# Patient Record
Sex: Female | Born: 1955 | Race: White | Hispanic: No | Smoking: Never smoker
Health system: Southern US, Community
[De-identification: ages and names within clinical notes are randomized; demographics above are authoritative.]

## PROBLEM LIST (undated history)

## (undated) ENCOUNTER — Emergency Department (HOSPITAL_COMMUNITY): Disposition: A | Discharge status: Home / Self Care | Payer: Medicaid Other

## (undated) DIAGNOSIS — N289 Disorder of kidney and ureter, unspecified: Secondary | ICD-10-CM

## (undated) DIAGNOSIS — G4733 Obstructive sleep apnea (adult) (pediatric): Secondary | ICD-10-CM

## (undated) DIAGNOSIS — I509 Heart failure, unspecified: Secondary | ICD-10-CM

## (undated) DIAGNOSIS — E11610 Type 2 diabetes mellitus with diabetic neuropathic arthropathy: Secondary | ICD-10-CM

## (undated) DIAGNOSIS — E782 Mixed hyperlipidemia: Secondary | ICD-10-CM

## (undated) DIAGNOSIS — I10 Essential (primary) hypertension: Secondary | ICD-10-CM

## (undated) DIAGNOSIS — G629 Polyneuropathy, unspecified: Secondary | ICD-10-CM

## (undated) DIAGNOSIS — N200 Calculus of kidney: Secondary | ICD-10-CM

## (undated) DIAGNOSIS — N183 Chronic kidney disease, stage 3 unspecified: Secondary | ICD-10-CM

## (undated) DIAGNOSIS — E119 Type 2 diabetes mellitus without complications: Secondary | ICD-10-CM

## (undated) DIAGNOSIS — M14671 Charcot's joint, right ankle and foot: Secondary | ICD-10-CM

## (undated) HISTORY — DX: Type 2 diabetes mellitus without complications: E11.9

## (undated) HISTORY — PX: KNEE SURGERY: SHX244

## (undated) HISTORY — PX: REPLACEMENT TOTAL KNEE: SUR1224

## (undated) HISTORY — DX: Mixed hyperlipidemia: E78.2

## (undated) HISTORY — PX: TONSILLECTOMY: SUR1361

## (undated) HISTORY — DX: Chronic kidney disease, stage 3 unspecified: N18.30

## (undated) HISTORY — DX: Essential (primary) hypertension: I10

## (undated) HISTORY — DX: Obstructive sleep apnea (adult) (pediatric): G47.33

## (undated) HISTORY — DX: Calculus of kidney: N20.0

## (undated) HISTORY — DX: Type 2 diabetes mellitus with diabetic neuropathic arthropathy: E11.610

## (undated) HISTORY — PX: ANKLE SURGERY: SHX546

---

## 2018-07-18 ENCOUNTER — Other Ambulatory Visit (HOSPITAL_COMMUNITY): Payer: Self-pay | Admitting: Internal Medicine

## 2018-07-18 ENCOUNTER — Telehealth (HOSPITAL_COMMUNITY): Payer: Self-pay | Admitting: Physical Therapy

## 2018-07-18 ENCOUNTER — Ambulatory Visit (HOSPITAL_COMMUNITY): Payer: Self-pay | Attending: Internal Medicine | Admitting: Physical Therapy

## 2018-07-18 ENCOUNTER — Ambulatory Visit (HOSPITAL_COMMUNITY)
Admission: RE | Admit: 2018-07-18 | Discharge: 2018-07-18 | Disposition: A | Discharge status: Home / Self Care | Payer: Self-pay | Source: Ambulatory Visit | Attending: Internal Medicine | Admitting: Internal Medicine

## 2018-07-18 ENCOUNTER — Other Ambulatory Visit: Payer: Self-pay

## 2018-07-18 DIAGNOSIS — M79671 Pain in right foot: Secondary | ICD-10-CM | POA: Insufficient documentation

## 2018-07-18 DIAGNOSIS — R600 Localized edema: Secondary | ICD-10-CM | POA: Insufficient documentation

## 2018-07-18 DIAGNOSIS — S91301S Unspecified open wound, right foot, sequela: Secondary | ICD-10-CM | POA: Insufficient documentation

## 2018-07-18 NOTE — Therapy (Signed)
[place], [place], [address] Phone: [phone]   Fax:  [phone]  Wound Care Evaluation  Patient Details  Name: Suzanne Tran MRN: [id number] Date of Birth: 08-22-1955 No data recorded  Encounter Date: 07/18/2018  PT End of Session - 07/18/18 1240    Visit Number  1    Number of Visits  12    Date for PT Re-Evaluation  08/29/18    Authorization Type  no insurance    PT Start Time  0815    PT Stop Time  0900    PT Time Calculation (min)  45 min    Activity Tolerance  Patient tolerated treatment well    Behavior During Therapy  [facility] for tasks assessed/performed       No past medical history on file. The histories are not reviewed yet. Please review them in the "History" navigator section and refresh this [place].  There were no vitals filed for this visit.     Wound Therapy - 07/18/18 1209    Subjective  Pt states that she has had sepsis in her Rt leg in 2018 and 2017 requiring hospitalization.  Pt noted a swelling in her foot about four weeks ago, then a wound appeared and it has been progressively increasing in size.     Patient and Family Stated Goals  wound to heal     Date of Onset  06/20/18    Prior Treatments  self care     Pain Scale  0-10    Pain Score  5     Pain Type  Acute pain    Pain Location  Foot    Pain Orientation  Right;Other (Comment)    Pain Descriptors / Indicators  --   plantar aspect    Pain Onset  Other (Comment)   with pressure    Patients Stated Pain Goal  0    Evaluation and Treatment Procedures Explained to Patient/Family  Yes    Evaluation and Treatment Procedures  agreed to    Wound Properties Date First Assessed: 07/18/18 Time First Assessed: 0827 Wound Type: Diabetic ulcer Location: Foot Location Orientation: Right Wound Description (Comments): plantar aspect  Present on Admission: Yes   Dressing Type  Gauze (Comment)    Dressing Changed  Changed    Dressing  Status  Old drainage    Dressing Change Frequency  PRN    Site / Wound Assessment  Red    % Wound base Red or Granulating  95%    % Wound base Yellow/Fibrinous Exudate  5%    Peri-wound Assessment  Edema;Other (Comment)   calloused medial for 2 cm; lateral, ant, sup for 1 cm    Wound Length (cm)  3.2 cm    Wound Width (cm)  1.9 cm    Wound Depth (cm)  0.3 cm   small hole in the center of the wound that has at least .5cm   Wound Volume (cm^3)  1.82 cm^3    Wound Surface Area (cm^2)  6.08 cm^2    Margins  Epibole (rolled edges)    Drainage Amount  Moderate    Drainage Description  Serous    Treatment  Cleansed;Debridement (Selective)    Selective Debridement - Location  dallous area    Selective Debridement - Tools Used  Forceps;Scissors    Selective Debridement - Tissue Removed  callous    Wound Therapy - Clinical Statement  Suzanne Tran is a 63 yo female  who has had a progressive wound on the bottom of her Rt foot for four weeks now. She has noted edema, pain, her LE is warm and red.   She has history of sepsis in her RT LE needing hospitalization in 2017 and 2018.  She currently has a wound that is progressing in size that has a small pinprick hole in the middle that has significant depth,(over .5cm but therapist was hesitant to go any further).  It was recommended that she goes to the hospital to have her foot x-rayed to rule out osteomylitis.  She will benefit from skilled physical therapy to create a healing environment for her wound and to decrease the swelling to allow donning of her boot/( pt has charcot foot).    Wound Therapy - Functional Problem List  difficulty walking; donning boot     Factors Delaying/Impairing Wound Healing  Altered sensation;Diabetes Mellitus;Multiple medical problems    Hydrotherapy Plan  Debridement;Dressing change;Patient/family education    Wound Therapy - Frequency  2X / week    Wound Therapy - Current Recommendations  PT    Wound Therapy - Follow Up  Recommendations  Other (comment)   x-ray   Wound Plan  Pt to be seen for cleansing, moisturizing, debridement and dressing changes 2x a week x 6 weeks     Dressing   silverhydrofiber, 2x2, kerlix and coban              Objective measurements completed on examination: See above findings.            PT Education - 07/18/18 1238    Education Details  keep dressing dry, get a x-ray    Person(s) Educated  Patient    Methods  Explanation    Comprehension  Verbalized understanding       PT Short Term Goals - 07/18/18 1247      PT SHORT TERM GOAL #1   Title  PT to verbalize the signs and sx of cellulitis and to contact MD ASAP     Time  1    Period  Weeks    Status  New    Target Date  07/25/18      PT SHORT TERM GOAL #2   Title  PT callous to be removed from wound to allow decrease pressure to allow wound to heal     Time  3    Period  Weeks    Status  New    Target Date  08/08/18      PT SHORT TERM GOAL #3   Title  wound drainage to decrease to scant to min to allow pt to be able to don socks without soiling     Time  3    Period  Weeks    Status  New      PT SHORT TERM GOAL #4   Title  epiboled edges to be gone to allow wound to begin healing     Time  3    Period  Weeks    Status  New        PT Long Term Goals - 07/18/18 1249      PT LONG TERM GOAL #1   Title  PT wound to have no depth to allow pt to be comfortable with self care     Time  6    Period  Weeks    Status  New    Target Date  08/29/18      PT LONG  TERM GOAL #2   Title  PT wound length and width to have decreased at least 1 cm to demonstrate a healing environment for pt to begin self care.     Time  6    Period  Weeks    Status  New      PT LONG TERM GOAL #3   Title  PT edema to have decreased to facilitate donning of boot.     Time  6    Period  Weeks    Status  New           Plan - 07/18/18 1241    Clinical Impression Statement  as above    Clinical Presentation   Evolving    Clinical Decision Making  Low    Rehab Potential  Fair    PT Frequency  2x / week    PT Duration  6 weeks    PT Treatment/Interventions  ADLs/Self Care Home Management;Patient/family education;Other (comment)   debridement/dressing change   PT Next Visit Plan  see above       Patient will benefit from skilled therapeutic intervention in order to improve the following deficits and impairments:  Pain, Decreased skin integrity, Difficulty walking, Increased edema  Visit Diagnosis: Localized edema  Pain in right foot  Open wound of right foot with complication, sequela    Problem List There are no active problems to display for this patient.  Rayetta Humphrey, [place] CLT [phone] 07/18/2018, 12:57 PM  [place] [address] [place], [place], [address] Phone: [phone]   Fax:  [phone]  Name: Suzanne Tran MRN: [id number] Date of Birth: 07/30/1955

## 2018-07-18 NOTE — Telephone Encounter (Signed)
Suzanne Tran had me to call Dr Marcelo Baldy office to get a stat order for x -ray on this patient's foot. Stacy from Dr Marcelo Baldy office said to send her on over to x-ray. WT

## 2018-07-18 NOTE — Telephone Encounter (Signed)
PT called re if she should be on antibiotics or not.  Therapist recommend that she call MD office and let him know that her leg was red and warm and let him decide as therapist is not a MD but these are warning signs for cellulitis.  Virgina Organ, PT CLT [phone]

## 2018-07-21 ENCOUNTER — Ambulatory Visit (HOSPITAL_COMMUNITY): Payer: Self-pay | Admitting: Physical Therapy

## 2018-07-21 DIAGNOSIS — R600 Localized edema: Secondary | ICD-10-CM

## 2018-07-21 DIAGNOSIS — S91301S Unspecified open wound, right foot, sequela: Secondary | ICD-10-CM

## 2018-07-21 DIAGNOSIS — M79671 Pain in right foot: Secondary | ICD-10-CM

## 2018-07-21 NOTE — Therapy (Signed)
[place], [place], [address] Phone: [phone]   Fax:  [phone]  Wound Care Therapy  Patient Details  Name: Suzanne Tran MRN: [id number] Date of Birth: 10-18-1955 No data recorded  Encounter Date: 07/21/2018  PT End of Session - 07/21/18 1716    Visit Number  2    Number of Visits  12    Date for PT Re-Evaluation  08/29/18    Authorization Type  no insurance    PT Start Time  1515    PT Stop Time  1600    PT Time Calculation (min)  45 min    Activity Tolerance  Patient tolerated treatment well    Behavior During Therapy  [facility] for tasks assessed/performed                 Wound Therapy - 07/21/18 1557    Subjective  pt states she just removed her bandage prior to appointment today.    Patient and Family Stated Goals  wound to heal     Date of Onset  06/20/18    Prior Treatments  self care     Pain Scale  0-10    Pain Score  2     Pain Type  Acute pain    Pain Location  Foot    Pain Orientation  Right    Evaluation and Treatment Procedures Explained to Patient/Family  Yes    Evaluation and Treatment Procedures  agreed to    Wound Properties Date First Assessed: 07/18/18 Time First Assessed: 0827 Wound Type: Diabetic ulcer Location: Foot Location Orientation: Right Wound Description (Comments): plantar aspect  Present on Admission: Yes   Dressing Type  Gauze (Comment)    Dressing Changed  Changed    Dressing Status  Old drainage    Dressing Change Frequency  PRN    Site / Wound Assessment  Red    % Wound base Red or Granulating  95%    % Wound base Yellow/Fibrinous Exudate  5%    Peri-wound Assessment  Edema;Other (Comment)   calloused medial for 2 cm; lateral, ant, sup for 1 cm    Margins  Epibole (rolled edges)    Drainage Amount  Moderate    Drainage Description  Serosanguineous    Treatment  Cleansed;Debridement (Selective)    Selective Debridement - Location  callous area perimeter of  wound    Selective Debridement - Tools Used  Forceps;Scissors    Selective Debridement - Tissue Removed  callous    Wound Therapy - Clinical Statement  pt returns today with only bandaid on plantar Rt foot covering wound.  Also noted several abraisons along top of leg from the dog jumping on her  Used profore light to help reduce edema and protect skin.  Pt reported overal comfort with profore lite.  Also cut out foot pressure pad and placed in bottom of CAM walker to reduce weight over woiund area.  Pt also reported comfort with this.  Continued with silver hydrofiber.      Wound Therapy - Functional Problem List  difficulty walking; donning boot     Factors Delaying/Impairing Wound Healing  Altered sensation;Diabetes Mellitus;Multiple medical problems    Hydrotherapy Plan  Debridement;Dressing change;Patient/family education    Wound Therapy - Frequency  2X / week    Wound Therapy - Current Recommendations  PT    Wound Therapy - Follow Up Recommendations  Other (comment)   x-ray   Wound Plan  Pt to be seen for cleansing, moisturizing, debridement and dressing changes 2x a week x 6 weeks     Dressing   silverhydrofiber, 2x2, profore lite                PT Short Term Goals - 07/18/18 1247      PT SHORT TERM GOAL #1   Title  PT to verbalize the signs and sx of cellulitis and to contact MD ASAP     Time  1    Period  Weeks    Status  New    Target Date  07/25/18      PT SHORT TERM GOAL #2   Title  PT callous to be removed from wound to allow decrease pressure to allow wound to heal     Time  3    Period  Weeks    Status  New    Target Date  08/08/18      PT SHORT TERM GOAL #3   Title  wound drainage to decrease to scant to min to allow pt to be able to don socks without soiling     Time  3    Period  Weeks    Status  New      PT SHORT TERM GOAL #4   Title  epiboled edges to be gone to allow wound to begin healing     Time  3    Period  Weeks    Status  New         PT Long Term Goals - 07/18/18 1249      PT LONG TERM GOAL #1   Title  PT wound to have no depth to allow pt to be comfortable with self care     Time  6    Period  Weeks    Status  New    Target Date  08/29/18      PT LONG TERM GOAL #2   Title  PT wound length and width to have decreased at least 1 cm to demonstrate a healing environment for pt to begin self care.     Time  6    Period  Weeks    Status  New      PT LONG TERM GOAL #3   Title  PT edema to have decreased to facilitate donning of boot.     Time  6    Period  Weeks    Status  New              Patient will benefit from skilled therapeutic intervention in order to improve the following deficits and impairments:     Visit Diagnosis: Localized edema  Pain in right foot  Open wound of right foot with complication, sequela     Problem List There are no active problems to display for this patient.  [name], PTA/CLT [phone]   [name] 07/21/2018, 5:23 PM  [place], [place], [address] Phone: [phone]   Fax:  [phone]  Name: Suzanne Tran MRN: [id number] Date of Birth: 07/30/1955

## 2018-07-23 ENCOUNTER — Ambulatory Visit (HOSPITAL_COMMUNITY): Payer: Self-pay

## 2018-07-25 ENCOUNTER — Ambulatory Visit (HOSPITAL_COMMUNITY): Payer: Self-pay

## 2018-07-25 ENCOUNTER — Encounter (HOSPITAL_COMMUNITY): Payer: Self-pay

## 2018-07-25 DIAGNOSIS — S91301S Unspecified open wound, right foot, sequela: Secondary | ICD-10-CM

## 2018-07-25 DIAGNOSIS — R600 Localized edema: Secondary | ICD-10-CM

## 2018-07-25 DIAGNOSIS — M79671 Pain in right foot: Secondary | ICD-10-CM

## 2018-07-25 NOTE — Therapy (Signed)
[place] [facility] [address] [place], [place], [address] Phone: [phone]   Fax:  [phone]  Wound Care Therapy  Patient Details  Name: Suzanne Tran MRN: [id number] Date of Birth: 12/01/1955 No data recorded  Encounter Date: 07/25/2018  PT End of Session - 07/25/18 1708    Visit Number  3    Number of Visits  12    Date for PT Re-Evaluation  08/29/18    Authorization Type  no insurance    PT Start Time  1607    PT Stop Time  1654    PT Time Calculation (min)  47 min    Activity Tolerance  Patient tolerated treatment well;No increased pain    Behavior During Therapy  [facility]/per[facility]       History reviewed. No pertinent past medical history.  History reviewed. No pertinent surgical history.  There were no vitals filed for this visit.   Subjective Assessment - 07/25/18 1839    Subjective  Pt arrived wiht dressings intact and CAM boot, no reports of pain today    Currently in Pain?  No/denies                Wound Therapy - 07/25/18 1840    Subjective  Pt arrived wiht dressings intact and CAM boot, no reports of pain today    Patient and Family Stated Goals  wound to heal     Date of Onset  06/20/18    Prior Treatments  self care     Pain Scale  0-10    Pain Score  0-No pain    Evaluation and Treatment Procedures Explained to Patient/Family  Yes    Evaluation and Treatment Procedures  agreed to    Wound Properties Date First Assessed: 07/18/18 Time First Assessed: 0827 Wound Type: Diabetic ulcer Location: Foot Location Orientation: Right Wound Description (Comments): plantar aspect  Present on Admission: Yes   Dressing Type  Silver hydrofiber;Gauze (Comment);Compression wrap   silverhydrofiber, gauze and profore lite   Dressing Changed  Changed    Dressing Status  Old drainage    Dressing Change Frequency  PRN    Site / Wound Assessment  Red    % Wound base Red or Granulating  95%    % Wound base  Yellow/Fibrinous Exudate  5%    Peri-wound Assessment  Edema;Other (Comment)   calloused medial for 2cm; lateral, superior and inferior by    Margins  Epibole (rolled edges)    Drainage Amount  Moderate    Drainage Description  Serosanguineous    Treatment  Cleansed;Debridement (Selective)    Selective Debridement - Location  callous area perimeter of wound    Selective Debridement - Tools Used  Forceps;Scalpel    Selective Debridement - Tissue Removed  callous    Wound Therapy - Clinical Statement  Pt arrived with dressings intact.  Wound cleansed and debridement focus on callous perimeter to promote healing.  Continued wiht silverhydrofiber and alginate to address drainiange and profore lite with netting.  Encouraged pt to leave dressings on until next session unless gets wet or pain and then to apply dressings over wound wiht verbalized understanding.      Wound Therapy - Functional Problem List  difficulty walking; donning boot     Factors Delaying/Impairing Wound Healing  Altered sensation;Diabetes Mellitus;Multiple medical problems    Hydrotherapy Plan  Debridement;Dressing change;Patient/family education    Wound Therapy - Frequency  2X / week  Wound Therapy - Current Recommendations  PT    Wound Therapy - Follow Up Recommendations  Other (comment)   x-ray   Wound Plan  Pt to be seen for cleansing, moisturizing, debridement and dressing changes 2x a week x 6 weeks     Dressing   silverhydrofiber, alginate, 2x2, profore lite                PT Short Term Goals - 07/18/18 1247      PT SHORT TERM GOAL #1   Title  PT to verbalize the signs and sx of cellulitis and to contact MD ASAP     Time  1    Period  Weeks    Status  New    Target Date  07/25/18      PT SHORT TERM GOAL #2   Title  PT callous to be removed from wound to allow decrease pressure to allow wound to heal     Time  3    Period  Weeks    Status  New    Target Date  08/08/18      PT SHORT TERM GOAL #3    Title  wound drainage to decrease to scant to min to allow pt to be able to don socks without soiling     Time  3    Period  Weeks    Status  New      PT SHORT TERM GOAL #4   Title  epiboled edges to be gone to allow wound to begin healing     Time  3    Period  Weeks    Status  New        PT Long Term Goals - 07/18/18 1249      PT LONG TERM GOAL #1   Title  PT wound to have no depth to allow pt to be comfortable with self care     Time  6    Period  Weeks    Status  New    Target Date  08/29/18      PT LONG TERM GOAL #2   Title  PT wound length and width to have decreased at least 1 cm to demonstrate a healing environment for pt to begin self care.     Time  6    Period  Weeks    Status  New      PT LONG TERM GOAL #3   Title  PT edema to have decreased to facilitate donning of boot.     Time  6    Period  Weeks    Status  New              Patient will benefit from skilled therapeutic intervention in order to improve the following deficits and impairments:     Visit Diagnosis: Localized edema  Pain in right foot  Open wound of right foot with complication, sequela     Problem List There are no active problems to display for this patient.  [address]Suzanne Tran, LPTA; CBIS [phone]  Suzanne Tran, Suzanne Jo 07/25/2018, 6:45 PM  [place] [facility] [address] [place], [place], [address] Phone: [phone]   Fax:  [phone]  Name: Suzanne Tran MRN: [id number] Date of Birth: 1956-06-11

## 2018-07-29 ENCOUNTER — Ambulatory Visit (HOSPITAL_COMMUNITY): Payer: Self-pay | Admitting: Physical Therapy

## 2018-07-29 DIAGNOSIS — R600 Localized edema: Secondary | ICD-10-CM

## 2018-07-29 DIAGNOSIS — M79671 Pain in right foot: Secondary | ICD-10-CM

## 2018-07-29 DIAGNOSIS — S91301S Unspecified open wound, right foot, sequela: Secondary | ICD-10-CM

## 2018-07-29 NOTE — Therapy (Signed)
[place], [place], [address] Phone: [phone]   Fax:  [phone]  Wound Care Therapy  Patient Details  Name: Suzanne Tran MRN: [id number] Date of Birth: May 30, 1956 No data recorded  Encounter Date: 07/29/2018  PT End of Session - 07/29/18 1118    Visit Number  4    Number of Visits  12    Date for PT Re-Evaluation  08/29/18    Authorization Type  no insurance    PT Start Time  0900    PT Stop Time  0945    PT Time Calculation (min)  45 min    Activity Tolerance  Patient tolerated treatment well;No increased pain    Behavior During Therapy  [facility] for tasks assessed/performed       No past medical history on file.  No past surgical history on file.  There were no vitals filed for this visit.              Wound Therapy - 07/29/18 0905    Subjective  PT states that her dressing became wet.      Patient and Family Stated Goals  wound to heal     Date of Onset  06/20/18    Prior Treatments  self care     Pain Scale  0-10    Pain Score  0-No pain    Evaluation and Treatment Procedures Explained to Patient/Family  Yes    Evaluation and Treatment Procedures  agreed to    Wound Properties Date First Assessed: 07/18/18 Time First Assessed: 0827 Wound Type: Diabetic ulcer Location: Foot Location Orientation: Right Wound Description (Comments): plantar aspect  Present on Admission: Yes   Dressing Type  Silver hydrofiber;Gauze (Comment);Compression wrap   silverhydrofiber, gauze and profore lite   Dressing Changed  Changed    Dressing Status  Old drainage    Dressing Change Frequency  PRN    Site / Wound Assessment  Red    % Wound base Red or Granulating  90%    % Wound base Yellow/Fibrinous Exudate  10%    Peri-wound Assessment  Edema;Other (Comment)   calloused medial for 2cm; lateral, superior and inferior by    Wound Length (cm)  3 cm   was 3.2   Wound Width (cm)  1.9 cm   was 1.9   Wound  Depth (cm)  --   2 slits.1st 1 cm with depth of at least 1cm; 2nd 3cm depth 1   Wound Surface Area (cm^2)  5.7 cm^2    Margins  Epibole (rolled edges)    Drainage Amount  Moderate    Drainage Description  Serosanguineous    Treatment  Cleansed;Debridement (Selective)    Selective Debridement - Location  callous area perimeter of wound    Selective Debridement - Tools Used  Forceps;Scissors    Selective Debridement - Tissue Removed  callous, slough from wound bed     Wound Therapy - Clinical Statement  PT dressing soaked through; explained to pt that when she feels wet she needs to remove and redress the wound. Instead of a small hole with depth the wound now has 2 small slits inside the wound both of which have significant depth.  Therapist used silverhydrofiber inside these areas,.     Wound Therapy - Functional Problem List  difficulty walking; donning boot     Factors Delaying/Impairing Wound Healing  Altered sensation;Diabetes Mellitus;Multiple medical problems    Hydrotherapy Plan  Debridement;Dressing  change;Patient/family education    Wound Therapy - Frequency  2X / week    Wound Therapy - Current Recommendations  PT    Wound Therapy - Follow Up Recommendations  Other (comment)   x-ray   Wound Plan  Pt to be seen for cleansing, moisturizing, debridement and dressing changes 2x a week x 6 weeks     Dressing   silverhydrofiber, alginate, 4x4 , profore lite              PT Education - 07/29/18 1118    Education Details  Not to allow dressing to be wet.  Remove dressing, cleanse and redress     Person(s) Educated  Patient    Methods  Explanation    Comprehension  Verbalized understanding       PT Short Term Goals - 07/29/18 1120      PT SHORT TERM GOAL #1   Title  PT to verbalize the signs and sx of cellulitis and to contact MD ASAP     Time  1    Period  Weeks    Status  Achieved      PT SHORT TERM GOAL #2   Title  PT callous to be removed from wound to allow  decrease pressure to allow wound to heal     Time  3    Period  Weeks    Status  On-going      PT SHORT TERM GOAL #3   Title  wound drainage to decrease to scant to min to allow pt to be able to don socks without soiling     Time  3    Period  Weeks    Status  On-going      PT SHORT TERM GOAL #4   Title  epiboled edges to be gone to allow wound to begin healing     Time  3    Period  Weeks    Status  On-going        PT Long Term Goals - 07/29/18 1120      PT LONG TERM GOAL #1   Title  PT wound to have no depth to allow pt to be comfortable with self care     Time  6    Period  Weeks    Status  On-going      PT LONG TERM GOAL #2   Title  PT wound length and width to have decreased at least 1 cm to demonstrate a healing environment for pt to begin self care.     Time  6    Period  Weeks    Status  On-going      PT LONG TERM GOAL #3   Title  PT edema to have decreased to facilitate donning of boot.     Time  6    Period  Weeks    Status  On-going            Plan - 07/29/18 1119    Clinical Impression Statement  as above     Clinical Presentation  Evolving    Rehab Potential  Fair    PT Frequency  2x / week    PT Duration  6 weeks    PT Treatment/Interventions  ADLs/Self Care Home Management;Patient/family education;Other (comment)   debridement/dressing change   PT Next Visit Plan  see above       Patient will benefit from skilled therapeutic intervention in order to improve the following deficits and  impairments:  Pain, Decreased skin integrity, Difficulty walking, Increased edema  Visit Diagnosis: Localized edema  Pain in right foot  Open wound of right foot with complication, sequela     Problem List There are no active problems to display for this patient.   Rayetta Humphrey, PT CLT [phone] 07/29/2018, 11:21 AM  [place], [place], [address] Phone: [phone]   Fax:   [phone]  Name: Suzanne Tran MRN: [id number] Date of Birth: 1956/07/04

## 2018-07-30 ENCOUNTER — Ambulatory Visit (HOSPITAL_COMMUNITY): Payer: Self-pay | Admitting: Physical Therapy

## 2018-07-31 ENCOUNTER — Ambulatory Visit (HOSPITAL_COMMUNITY): Payer: Self-pay | Admitting: Physical Therapy

## 2018-07-31 ENCOUNTER — Telehealth (HOSPITAL_COMMUNITY): Payer: Self-pay | Admitting: Physical Therapy

## 2018-07-31 NOTE — Telephone Encounter (Signed)
Patient call to cancel her appt ,she is not feeling well.

## 2018-08-05 ENCOUNTER — Emergency Department (HOSPITAL_COMMUNITY)
Admission: EM | Admit: 2018-08-05 | Discharge: 2018-08-05 | Disposition: A | Discharge status: Home / Self Care | Payer: Self-pay | Attending: Emergency Medicine | Admitting: Emergency Medicine

## 2018-08-05 ENCOUNTER — Other Ambulatory Visit: Payer: Self-pay

## 2018-08-05 ENCOUNTER — Encounter (HOSPITAL_COMMUNITY): Payer: Self-pay | Admitting: Emergency Medicine

## 2018-08-05 ENCOUNTER — Ambulatory Visit (HOSPITAL_COMMUNITY): Payer: Self-pay | Admitting: Physical Therapy

## 2018-08-05 ENCOUNTER — Emergency Department (HOSPITAL_COMMUNITY): Payer: Self-pay

## 2018-08-05 DIAGNOSIS — S91301D Unspecified open wound, right foot, subsequent encounter: Secondary | ICD-10-CM | POA: Insufficient documentation

## 2018-08-05 DIAGNOSIS — Z5189 Encounter for other specified aftercare: Secondary | ICD-10-CM | POA: Insufficient documentation

## 2018-08-05 DIAGNOSIS — S91301S Unspecified open wound, right foot, sequela: Secondary | ICD-10-CM

## 2018-08-05 DIAGNOSIS — E119 Type 2 diabetes mellitus without complications: Secondary | ICD-10-CM | POA: Insufficient documentation

## 2018-08-05 DIAGNOSIS — R600 Localized edema: Secondary | ICD-10-CM

## 2018-08-05 DIAGNOSIS — I10 Essential (primary) hypertension: Secondary | ICD-10-CM | POA: Insufficient documentation

## 2018-08-05 DIAGNOSIS — M79671 Pain in right foot: Secondary | ICD-10-CM

## 2018-08-05 DIAGNOSIS — Z96659 Presence of unspecified artificial knee joint: Secondary | ICD-10-CM | POA: Insufficient documentation

## 2018-08-05 DIAGNOSIS — X58XXXD Exposure to other specified factors, subsequent encounter: Secondary | ICD-10-CM | POA: Insufficient documentation

## 2018-08-05 HISTORY — DX: Type 2 diabetes mellitus without complications: E11.9

## 2018-08-05 HISTORY — DX: Disorder of kidney and ureter, unspecified: N28.9

## 2018-08-05 HISTORY — DX: Essential (primary) hypertension: I10

## 2018-08-05 LAB — CBC WITH DIFFERENTIAL/PLATELET
Abs Immature Granulocytes: 0.03 10*3/uL (ref 0.00–0.07)
Basophils Absolute: 0.1 10*3/uL (ref 0.0–0.1)
Basophils Relative: 1 %
Eosinophils Absolute: 0.3 10*3/uL (ref 0.0–0.5)
Eosinophils Relative: 3 %
HCT: 38.1 % (ref 36.0–46.0)
Hemoglobin: 11.9 g/dL — ABNORMAL LOW (ref 12.0–15.0)
Immature Granulocytes: 0 %
Lymphocytes Relative: 41 %
Lymphs Abs: 3.5 10*3/uL (ref 0.7–4.0)
MCH: 27.7 pg (ref 26.0–34.0)
MCHC: 31.2 g/dL (ref 30.0–36.0)
MCV: 88.6 fL (ref 80.0–100.0)
Monocytes Absolute: 0.6 10*3/uL (ref 0.1–1.0)
Monocytes Relative: 7 %
Neutro Abs: 4.1 10*3/uL (ref 1.7–7.7)
Neutrophils Relative %: 48 %
Platelets: 405 10*3/uL — ABNORMAL HIGH (ref 150–400)
RBC: 4.3 MIL/uL (ref 3.87–5.11)
RDW: 13 % (ref 11.5–15.5)
WBC: 8.5 10*3/uL (ref 4.0–10.5)
nRBC: 0 % (ref 0.0–0.2)

## 2018-08-05 LAB — BASIC METABOLIC PANEL
Anion gap: 9 (ref 5–15)
BUN: 27 mg/dL — ABNORMAL HIGH (ref 8–23)
CO2: 29 mmol/L (ref 22–32)
Calcium: 9.5 mg/dL (ref 8.9–10.3)
Chloride: 99 mmol/L (ref 98–111)
Creatinine, Ser: 1.14 mg/dL — ABNORMAL HIGH (ref 0.44–1.00)
GFR calc Af Amer: 59 mL/min — ABNORMAL LOW (ref >60)
GFR calc non Af Amer: 51 mL/min — ABNORMAL LOW (ref >60)
Glucose, Bld: 87 mg/dL (ref 70–99)
Potassium: 3.5 mmol/L (ref 3.5–5.1)
Sodium: 137 mmol/L (ref 135–145)

## 2018-08-05 MED ORDER — DOXYCYCLINE HYCLATE 100 MG PO CAPS: 100.0000 mg | ORAL_CAPSULE | Freq: Two times a day (BID) | ORAL | 0 refills | Status: AC

## 2018-08-05 NOTE — Discharge Instructions (Addendum)
You have been seen today for the wound on your right foot. Please read and follow all provided instructions.   1. Medications: Prescription for Doxycycline to treat skin and soft tissue infection. Continue usual home medications 2. Treatment: rest, drink plenty of fluids,  3. Follow Up: Please follow up with the [facility] for your already scheduled appointment on Thursday 08/07/2018.  Please return to the ER for any new or worsening symptoms.   Take medications as prescribed. Please review all of the medicines and only take them if you do not have an allergy to them. Return to the emergency room for worsening condition or new concerning symptoms. Follow up with your regular doctor. If you don't have a regular doctor use one of the numbers below to establish a primary care doctor.  Please be aware that if you are taking birth control pills, taking other prescriptions, ESPECIALLY ANTIBIOTICS may make the birth control ineffective - if this is the case, either do not engage in sexual activity or use alternative methods of birth control such as condoms until you have finished the medicine and your family doctor says it is OK to restart them. If you are on a blood thinner such as COUMADIN, be aware that any other medicine that you take may cause the coumadin to either work too much, or not enough - you should have your coumadin level rechecked in next 7 days if this is the case.  ?  It is also a possibility that you have an allergic reaction to any of the medicines that you have been prescribed - Everybody reacts differently to medications and while MOST people have no trouble with most medicines, you may have a reaction such as nausea, vomiting, rash, swelling, shortness of breath. If this is the case, please stop taking the medicine immediately and contact your physician.  ?  You should return to the ER if you develop severe or worsening symptoms.

## 2018-08-05 NOTE — ED Provider Notes (Signed)
Medical screening examination/treatment/procedure(s) were conducted as a shared visit with non-physician practitioner(s) and myself.  I personally evaluated the patient during the encounter.  Clinical Impression:   Final diagnoses:  Visit for wound check  Diabetic foot ulcer  Diabetic but otherwise well-appearing 63 year old female with a history of a right Charcot foot who has had progressive swelling and ulceration of the wound on the plantar surface of the foot more laterally, has been cared for at the wound care center who has been debriding the wound and has ultimately asked the patient to come to the hospital today to make sure that things are not getting infected.  She denies fevers but states she spelt a slight odor today.  She has very decreased sensation in her feet secondary to chronic neuropathy.  She is not currently taking an antibiotic.  On exam she does have a approximately 2 cm open wound to the plantar aspect of the right foot, there is good pink granulation tissue with no foul smell, slight serous drainage, no surrounding emphysematous changes of the skin or tenderness or redness or streaks up the leg.  She is well-appearing and afebrile with no leukocytosis.  Imaging to rule out deep tissue infection however anticipate discharge on antibiotics.  Patient agreeable and has a follow-up in 48 hours.    Eber Hong, MD 08/06/18 1153

## 2018-08-05 NOTE — ED Triage Notes (Signed)
Pt is being treated at [facility] for a wound on her right foot.  Pt states that staff noticed her wound looked infected and was told to come to ED.

## 2018-08-05 NOTE — Therapy (Signed)
[facility] [address] [place], [place], [address] Phone: [phone]   Fax:  [phone]  Wound Care Therapy  Patient Details  Name: Suzanne Tran MRN: [id number] Date of Birth: Mar 03, 1956 No data recorded  Encounter Date: 08/05/2018  PT End of Session - 08/05/18 0950    Visit Number  5    Number of Visits  12    Date for PT Re-Evaluation  08/29/18    Authorization Type  no insurance    PT Start Time  0905    PT Stop Time  0943    PT Time Calculation (min)  38 min    Activity Tolerance  Patient tolerated treatment well;No increased pain    Behavior During Therapy  [facility] for tasks assessed/performed       No past medical history on file.  No past surgical history on file.  There were no vitals filed for this visit.              Wound Therapy - 08/05/18 0943    Subjective  PT states that she had to cancel last appointment due to being ill.  Pt changed dressing.    Patient and Family Stated Goals  wound to heal     Date of Onset  06/20/18    Prior Treatments  self care     Pain Scale  0-10    Pain Score  3     Evaluation and Treatment Procedures Explained to Patient/Family  Yes    Evaluation and Treatment Procedures  agreed to    Wound Properties Date First Assessed: 07/18/18 Time First Assessed: 0827 Wound Type: Diabetic ulcer Location: Foot Location Orientation: Right Wound Description (Comments): plantar aspect  Present on Admission: Yes   Dressing Type  Silver hydrofiber;Gauze (Comment);Compression wrap   silverhydrofiber, gauze and profore lite   Dressing Changed  Changed    Dressing Status  Old drainage    Dressing Change Frequency  PRN    Site / Wound Assessment  Red    % Wound base Red or Granulating  80%    % Wound base Yellow/Fibrinous Exudate  15%    % Wound base Black/Eschar  5%    Peri-wound Assessment  Edema;Erythema (non-blanchable);Maceration;Other (Comment)   calloused medial for 2cm; lateral,  superior and inferior by    Wound Depth (cm)  --   at least 1.5 cm    Margins  Epibole (rolled edges)    Drainage Amount  Moderate    Drainage Description  Serous    Treatment  Cleansed;Debridement (Selective);Hydrotherapy (Pulse lavage)    Pulsed lavage therapy - wound location  wound bed     Pulsed Lavage with Suction (psi)  4 psi    Pulsed Lavage with Suction - Normal Saline Used  1000 mL    Pulsed Lavage Tip  Tip with splash shield    Selective Debridement - Location  slough and callous area perimeter of wound    Selective Debridement - Tools Used  Forceps;Scissors    Selective Debridement - Tissue Removed  callous, slough from wound bed     Wound Therapy - Clinical Statement  PT has been seen in this clinic for three weeks.  Each week wound has regressed.  Initially pt had a pin prick opening in her wound; therapist recommended an x-ray to rule out osteomylitis however order was put in for ankle pain therefore therapist is unsure if correct images were taken as wound is on the plantar  aspect of pt foot.  The second week there were two slits instead of a pin prick opening and today there is a hole.  Therapist recommended pt going to the ER as there is also redness extending greater than 2cm and warmth at this time.  Pt verbalized understanding.     Wound Therapy - Functional Problem List  difficulty walking; donning boot     Factors Delaying/Impairing Wound Healing  Altered sensation;Diabetes Mellitus;Multiple medical problems    Hydrotherapy Plan  Debridement;Dressing change;Patient/family education    Wound Therapy - Frequency  2X / week    Wound Therapy - Current Recommendations  PT    Wound Therapy - Follow Up Recommendations  Other (comment)   x-ray   Wound Plan  Continue treatment as needed     Dressing   silverhydrofiber, 4x4 , Kerlix and netting                 PT Short Term Goals - 07/29/18 1120      PT SHORT TERM GOAL #1   Title  PT to verbalize the signs and sx  of cellulitis and to contact MD ASAP     Time  1    Period  Weeks    Status  Achieved      PT SHORT TERM GOAL #2   Title  PT callous to be removed from wound to allow decrease pressure to allow wound to heal     Time  3    Period  Weeks    Status  On-going      PT SHORT TERM GOAL #3   Title  wound drainage to decrease to scant to min to allow pt to be able to don socks without soiling     Time  3    Period  Weeks    Status  On-going      PT SHORT TERM GOAL #4   Title  epiboled edges to be gone to allow wound to begin healing     Time  3    Period  Weeks    Status  On-going        PT Long Term Goals - 07/29/18 1120      PT LONG TERM GOAL #1   Title  PT wound to have no depth to allow pt to be comfortable with self care     Time  6    Period  Weeks    Status  On-going      PT LONG TERM GOAL #2   Title  PT wound length and width to have decreased at least 1 cm to demonstrate a healing environment for pt to begin self care.     Time  6    Period  Weeks    Status  On-going      PT LONG TERM GOAL #3   Title  PT edema to have decreased to facilitate donning of boot.     Time  6    Period  Weeks    Status  On-going              Patient will benefit from skilled therapeutic intervention in order to improve the following deficits and impairments:     Visit Diagnosis: Localized edema  Pain in right foot  Open wound of right foot with complication, sequela     Problem List There are no active problems to display for this patient.   Suzanne Tran, PT CLT [phone] 08/05/2018, 9:51 AM  [facility] [address] [place], [place], [address] Phone: [phone]   Fax:  [phone]  Name: Suzanne Tran MRN: [id number] Date of Birth: 1956/04/08

## 2018-08-05 NOTE — ED Provider Notes (Signed)
[facility] EMERGENCY DEPARTMENT Provider Note   CSN: [id number] Arrival date & time: 08/05/18  1423     History   Chief Complaint Chief Complaint  Patient presents with  . Wound Check    HPI Suzanne Tran is a 63 y.o. female with history of htn, diabetes, charcot foot presenting to the emergency department today with chief complaint of wound on right foot. The wound has been present x 2 months and she is currently treated at [facility]. While at her appointment today the staff suggested she be seen in ED because they thought the wound  Is looking worse.   Denies fever, malodorous or purulent drainage.   Past Medical History:  Diagnosis Date  . Diabetes mellitus without complication ([place])   . Hypertension   . Renal disorder     There are no active problems to display for this patient.   Past Surgical History:  Procedure Laterality Date  . ANKLE SURGERY    . KNEE SURGERY    . REPLACEMENT TOTAL KNEE       OB History   No obstetric history on file.      Home Medications    Prior to Admission medications   Medication Sig Start Date End Date Taking? Authorizing Provider  doxycycline (VIBRAMYCIN) 100 MG capsule Take 1 capsule (100 mg total) by mouth 2 (two) times daily for 10 days. 08/05/18 08/15/18  Vivianne Carles, Harley Hallmark, PA-C    Family History No family history on file.  Social History Social History   Tobacco Use  . Smoking status: Never Smoker  . Smokeless tobacco: Never Used  Substance Use Topics  . Alcohol use: Never    Frequency: Never  . Drug use: Never     Allergies   Codeine   Review of Systems Review of Systems  Constitutional: Negative for chills and fever.  HENT: Negative for congestion, sinus pressure and sore throat.   Eyes: Negative for pain.  Respiratory: Negative for chest tightness and shortness of breath.   Cardiovascular: Negative for chest pain, palpitations and leg swelling.  Gastrointestinal: Negative for  abdominal pain, diarrhea, nausea and vomiting.  Genitourinary: Negative for difficulty urinating and hematuria.  Musculoskeletal: Negative for arthralgias, gait problem, joint swelling and neck pain.  Skin: Positive for wound. Negative for rash.  Allergic/Immunologic: Positive for immunocompromised state (diabetes).  Neurological: Negative for syncope and headaches.     Physical Exam Updated Vital Signs BP (!) 154/90   Pulse 81   Temp 98.1 F (36.7 C) (Oral)   Resp 16   Ht '5\' 9"'$  (1.753 m)   Wt 108.9 kg   SpO2 98%   BMI 35.44 kg/m   Physical Exam Vitals signs and nursing note reviewed.  Constitutional:      Appearance: She is not ill-appearing or toxic-appearing.  HENT:     Head: Normocephalic and atraumatic.     Nose: Nose normal.     Mouth/Throat:     Mouth: Mucous membranes are moist.     Pharynx: Oropharynx is clear.  Eyes:     Conjunctiva/sclera: Conjunctivae normal.  Neck:     Musculoskeletal: Normal range of motion.  Cardiovascular:     Rate and Rhythm: Normal rate and regular rhythm.     Pulses: Normal pulses.     Heart sounds: Normal heart sounds.  Pulmonary:     Effort: Pulmonary effort is normal.     Breath sounds: Normal breath sounds.  Abdominal:  General: There is no distension.     Palpations: Abdomen is soft.     Tenderness: There is no abdominal tenderness. There is no guarding or rebound.  Musculoskeletal: Normal range of motion.     Comments: Full ROM in all extremities  Skin:    General: Skin is warm and dry.     Capillary Refill: Capillary refill takes less than 2 seconds.     Comments: 2 cm x 4 cm wound on plantar of right foot. Wound is 1 cm deep.There is no discharge, there is no odor. The skin surrounding the wound is pink without signs of infection. There is no surrounding erythema, streaking, or warmth  Neurological:     Mental Status: She is alert. Mental status is at baseline.     Motor: No weakness.  Psychiatric:         Behavior: Behavior normal.      ED Treatments / Results  Labs (all labs ordered are listed, but only abnormal results are displayed) Labs Reviewed  CBC WITH DIFFERENTIAL/PLATELET - Abnormal; Notable for the following components:      Result Value   Hemoglobin 11.9 (*)    Platelets 405 (*)    All other components within normal limits  BASIC METABOLIC PANEL - Abnormal; Notable for the following components:   BUN 27 (*)    Creatinine, Ser 1.14 (*)    GFR calc non Af Amer 51 (*)    GFR calc Af Amer 59 (*)    All other components within normal limits    EKG None  Radiology Dg Foot Complete Right  Result Date: 08/05/2018 CLINICAL DATA:  RIGHT foot wound question infection, wound at plantar surface of foot EXAM: RIGHT FOOT COMPLETE - 3+ VIEW COMPARISON:  07/18/2018 FINDINGS: Osseous mineralization diffusely decreased. Degenerative changes at DIP joint second toe. Chronic cortical thickening of the fourth and fifth metatarsal shafts. Degenerative changes also identified at the naviculocuneiform joint as well as additional intertarsal joints. No definite fracture, dislocation or bone destruction. Plantar ulcer at midfoot with significant soft tissue swelling. Deep to the ulcer and the area of soft tissue swelling, an area of potential cortical destruction is identified suspicious for osteomyelitis, question at the plantar aspect of the cuboid. IMPRESSION: Soft tissue swelling and ulcer at plantar aspect of mid foot with question focus of bone destruction at the plantar aspect of the underlying tarsals, potentially cuboid Scattered degenerative changes RIGHT foot. Recommend further assessment by MR imaging to exclude osteomyelitis. Electronically Signed   By: Lavonia Dana M.D.   On: 08/05/2018 17:32    Procedures Procedures (including critical care time)  Medications Ordered in ED Medications - No data to display   Initial Impression / Assessment and Plan / ED Course  I have reviewed the  triage vital signs and the nursing notes.  Pertinent labs & imaging results that were available during my care of the patient were reviewed by me and considered in my medical decision making (see chart for details).   Pt is afebrile and well appearing. She has the wound to plantar aspect of right foot without signs of spreading infection to include erythema, warmth, streaking. CBC without leukocytosis, BMP with BUN/Creatinine of 27/1.14. There is no prior records to compare this, so advised pt to follow up with pcp.  Xray of right foot is negative for signs of deep tissue infection. Will discharge home with doxycycline to treat the wound as she has not been on antibiotics for it previously.  She already has wound care follow up scheduled in 2 days at [facility]. Pt given xray report for followup.  Discussed strict ED return precautions. Pt verbalized understanding of and is in agreement with this plan. Pt stable for discharge home at this time.  The patient was discussed with and seen by Dr. Sabra Heck who agrees with the treatment plan.     Final Clinical Impressions(s) / ED Diagnoses   Final diagnoses:  Visit for wound check    ED Discharge Orders         Ordered    doxycycline (VIBRAMYCIN) 100 MG capsule  2 times daily     08/05/18 1742           Zyonna Vardaman, Erie Noe 08/06/18 0241    Noemi Chapel, MD 08/06/18 1153

## 2018-08-06 ENCOUNTER — Ambulatory Visit (HOSPITAL_COMMUNITY): Payer: Self-pay | Admitting: Physical Therapy

## 2018-08-08 ENCOUNTER — Ambulatory Visit (HOSPITAL_COMMUNITY): Payer: Self-pay

## 2018-08-08 DIAGNOSIS — S91301S Unspecified open wound, right foot, sequela: Secondary | ICD-10-CM

## 2018-08-08 DIAGNOSIS — R600 Localized edema: Secondary | ICD-10-CM

## 2018-08-08 DIAGNOSIS — M79671 Pain in right foot: Secondary | ICD-10-CM

## 2018-08-08 NOTE — Therapy (Addendum)
[place], [place], [address] Phone: [phone]   Fax:  [phone]  Wound Care Therapy  Patient Details  Name: Suzanne Tran MRN: [id number] Date of Birth: [birth year] No data recorded  Encounter Date: 08/08/2018  PT End of Session - 08/08/18 1334    Visit Number  6    Number of Visits  12    Date for PT Re-Evaluation  08/29/18    Authorization Type  no insurance    PT Start Time  0912    PT Stop Time  0950    PT Time Calculation (min)  38 min    Activity Tolerance  Patient tolerated treatment well;No increased pain    Behavior During Therapy  WFL for tasks assessed/performed       Past Medical History:  Diagnosis Date  . Diabetes mellitus without complication ([place])   . Hypertension   . Renal disorder     Past Surgical History:  Procedure Laterality Date  . ANKLE SURGERY    . KNEE SURGERY    . REPLACEMENT TOTAL KNEE      There were no vitals filed for this visit.   Subjective Assessment - 08/08/18 1006    Subjective  Went to ER and had lots of testing including xrays and blood work, no infection found though has been perscribed 10 day antibiotics    Currently in Pain?  No/denies                Wound Therapy - 08/08/18 1008    Subjective  Went to ER and had lots of testing including xrays and blood work, no infection found though has been perscribed 10 day antibiotics    Patient and Family Stated Goals  wound to heal     Date of Onset  06/20/18    Prior Treatments  self care     Pain Scale  0-10    Pain Score  0-No pain    Evaluation and Treatment Procedures Explained to Patient/Family  Yes    Evaluation and Treatment Procedures  agreed to    Wound Properties Date First Assessed: 07/18/18 Time First Assessed: 0827 Wound Type: Diabetic ulcer Location: Foot Location Orientation: Right Wound Description (Comments): plantar aspect  Present on Admission: Yes   Dressing Type  Silver  hydrofiber;Gauze (Comment)   silverhydrofiber, ABD, 4x4, mediporetape   Dressing Changed  Changed    Dressing Status  Old drainage    Dressing Change Frequency  PRN    Site / Wound Assessment  Red    % Wound base Red or Granulating  95%    % Wound base Yellow/Fibrinous Exudate  5%    % Wound base Other/Granulation Tissue (Comment)  --   callous surrounding   Peri-wound Assessment  Erythema (non-blanchable)    Wound Length (cm)  3 cm    Wound Width (cm)  2.2 cm    Wound Depth (cm)  --   unknown, atleast 1cm   Wound Surface Area (cm^2)  6.6 cm^2    Margins  Epibole (rolled edges)    Drainage Amount  Moderate    Drainage Description  Purulent    Treatment  Cleansed;Debridement (Selective);Hydrotherapy (Pulse lavage)    Pulsed lavage therapy - wound location  wound bed     Pulsed Lavage with Suction (psi)  4 psi    Pulsed Lavage with Suction - Normal Saline Used  1000 mL    Pulsed Lavage Tip  Tip with  splash shield    Selective Debridement - Location  slough and callous area perimeter of wound    Selective Debridement - Tools Used  Scalpel    Selective Debridement - Tissue Removed  callous, slough from wound bed     Wound Therapy - Clinical Statement  Continued wiht PLS for cleansing wound bed, selective debridement for removal of callous surrounding all sides of wound bed.  Wound is regressing wiht increase in side and unknown depth though wound bed wiht increased granulation tissues.  Packed wound bed wiht silverhydrofiber, ABD and 4x4 for draininag.  No edema present today.      Wound Therapy - Functional Problem List  difficulty walking; donning boot     Factors Delaying/Impairing Wound Healing  Altered sensation;Diabetes Mellitus;Multiple medical problems    Hydrotherapy Plan  Debridement;Dressing change;Patient/family education    Wound Therapy - Frequency  2X / week    Wound Therapy - Current Recommendations  PT    Wound Therapy - Follow Up Recommendations  Other (comment)    x-ray   Wound Plan  Continue treatment as needed     Dressing   silverhydrofiber, 4x4 , Kerlix and netting                 PT Short Term Goals - 07/29/18 1120      PT SHORT TERM GOAL #1   Title  PT to verbalize the signs and sx of cellulitis and to contact MD ASAP     Time  1    Period  Weeks    Status  Achieved      PT SHORT TERM GOAL #2   Title  PT callous to be removed from wound to allow decrease pressure to allow wound to heal     Time  3    Period  Weeks    Status  On-going      PT SHORT TERM GOAL #3   Title  wound drainage to decrease to scant to min to allow pt to be able to don socks without soiling     Time  3    Period  Weeks    Status  On-going      PT SHORT TERM GOAL #4   Title  epiboled edges to be gone to allow wound to begin healing     Time  3    Period  Weeks    Status  On-going        PT Long Term Goals - 07/29/18 1120      PT LONG TERM GOAL #1   Title  PT wound to have no depth to allow pt to be comfortable with self care     Time  6    Period  Weeks    Status  On-going      PT LONG TERM GOAL #2   Title  PT wound length and width to have decreased at least 1 cm to demonstrate a healing environment for pt to begin self care.     Time  6    Period  Weeks    Status  On-going      PT LONG TERM GOAL #3   Title  PT edema to have decreased to facilitate donning of boot.     Time  6    Period  Weeks    Status  On-going              Patient will benefit from skilled therapeutic intervention in order  to improve the following deficits and impairments:     Visit Diagnosis: Localized edema  Pain in right foot  Open wound of right foot with complication, sequela     Problem List There are no active problems to display for this patient.  [address], LPTA; [place]  Aldona Lento 08/08/2018, 3:15 PM  [place] [address] [place], [place],  [address] Phone: [phone]   Fax:  [phone]  Name: Suzanne Tran MRN: [id number] Date of Birth: 12/19/1955

## 2018-08-11 ENCOUNTER — Ambulatory Visit (HOSPITAL_COMMUNITY): Payer: Self-pay | Admitting: Physical Therapy

## 2018-08-11 ENCOUNTER — Encounter (HOSPITAL_COMMUNITY): Payer: Self-pay | Admitting: Physical Therapy

## 2018-08-11 DIAGNOSIS — M79671 Pain in right foot: Secondary | ICD-10-CM

## 2018-08-11 DIAGNOSIS — S91301S Unspecified open wound, right foot, sequela: Secondary | ICD-10-CM

## 2018-08-11 DIAGNOSIS — R600 Localized edema: Secondary | ICD-10-CM

## 2018-08-11 NOTE — Therapy (Signed)
[place], [place], [address] Phone: [phone]   Fax:  [phone]  Wound Care Therapy  Patient Details  Name: Suzanne Tran MRN: [id number] Date of Birth: Nov 25, 1955 No data recorded  Encounter Date: 08/11/2018  PT End of Session - 08/11/18 1430    Visit Number  7    Number of Visits  12    Date for PT Re-Evaluation  08/29/18    Authorization Type  no insurance    PT Start Time  [id number]    PT Stop Time  1122    PT Time Calculation (min)  42 min    Activity Tolerance  Patient tolerated treatment well;No increased pain    Behavior During Therapy  WFL for tasks assessed/performed       Past Medical History:  Diagnosis Date  . Diabetes mellitus without complication ([place])   . Hypertension   . Renal disorder     Past Surgical History:  Procedure Laterality Date  . ANKLE SURGERY    . KNEE SURGERY    . REPLACEMENT TOTAL KNEE      There were no vitals filed for this visit.              Wound Therapy - 08/11/18 1424    Subjective  pt states she is doing well today.  States she had to change her dressing last night and this morning due to drainage.     Patient and Family Stated Goals  wound to heal     Date of Onset  06/20/18    Prior Treatments  self care     Pain Scale  0-10    Pain Score  0-No pain    Evaluation and Treatment Procedures Explained to Patient/Family  Yes    Evaluation and Treatment Procedures  agreed to    Wound Properties Date First Assessed: 07/18/18 Time First Assessed: 0827 Wound Type: Diabetic ulcer Location: Foot Location Orientation: Right Wound Description (Comments): plantar aspect  Present on Admission: Yes   Dressing Type  Silver hydrofiber;Gauze (Comment)   silverhydrofiber, ABD, 4x4, mediporetape   Dressing Changed  Changed    Dressing Status  Old drainage    Dressing Change Frequency  PRN    Site / Wound Assessment  Red    % Wound base Red or Granulating  95%     % Wound base Yellow/Fibrinous Exudate  5%    % Wound base Other/Granulation Tissue (Comment)  --   callous surrounding   Peri-wound Assessment  Erythema (non-blanchable)    Margins  Epibole (rolled edges)    Drainage Amount  Minimal    Drainage Description  Serosanguineous    Treatment  Hydrotherapy (Pulse lavage);Debridement (Selective)    Pulsed lavage therapy - wound location  wound bed     Pulsed Lavage with Suction (psi)  4 psi    Pulsed Lavage with Suction - Normal Saline Used  1000 mL    Pulsed Lavage Tip  Tip with splash shield    Selective Debridement - Location  slough and callous area perimeter of wound    Selective Debridement - Tools Used  Scalpel    Selective Debridement - Tissue Removed  callous, slough from wound bed     Wound Therapy - Clinical Statement  PLS for cleansing deeper into wound with continued debridement of calloused perimeter and undermining.  Wound with noted depth centrally with minimal change from last session.  No odor or signs/symptoms of infection  today.  Continued packing with silver hydrofiber.  Used gauze, ABD and kerlix around wound and #5 netting to secure.     Wound Therapy - Functional Problem List  difficulty walking; donning boot     Factors Delaying/Impairing Wound Healing  Altered sensation;Diabetes Mellitus;Multiple medical problems    Hydrotherapy Plan  Debridement;Dressing change;Patient/family education    Wound Therapy - Frequency  2X / week    Wound Therapy - Current Recommendations  PT    Wound Therapy - Follow Up Recommendations  Other (comment)   x-ray   Wound Plan  Continue treatment as needed.  Measure weekly.    Dressing   silverhydrofiber, 4x4 , Kerlix and netting                 PT Short Term Goals - 07/29/18 1120      PT SHORT TERM GOAL #1   Title  PT to verbalize the signs and sx of cellulitis and to contact MD ASAP     Time  1    Period  Weeks    Status  Achieved      PT SHORT TERM GOAL #2   Title  PT  callous to be removed from wound to allow decrease pressure to allow wound to heal     Time  3    Period  Weeks    Status  On-going      PT SHORT TERM GOAL #3   Title  wound drainage to decrease to scant to min to allow pt to be able to don socks without soiling     Time  3    Period  Weeks    Status  On-going      PT SHORT TERM GOAL #4   Title  epiboled edges to be gone to allow wound to begin healing     Time  3    Period  Weeks    Status  On-going        PT Long Term Goals - 07/29/18 1120      PT LONG TERM GOAL #1   Title  PT wound to have no depth to allow pt to be comfortable with self care     Time  6    Period  Weeks    Status  On-going      PT LONG TERM GOAL #2   Title  PT wound length and width to have decreased at least 1 cm to demonstrate a healing environment for pt to begin self care.     Time  6    Period  Weeks    Status  On-going      PT LONG TERM GOAL #3   Title  PT edema to have decreased to facilitate donning of boot.     Time  6    Period  Weeks    Status  On-going              Patient will benefit from skilled therapeutic intervention in order to improve the following deficits and impairments:     Visit Diagnosis: Localized edema  Pain in right foot  Open wound of right foot with complication, sequela     Problem List There are no active problems to display for this patient.  [name], PTA/CLT [phone]  [name] 08/11/2018, 2:31 PM  [place] [address] [place], [place], [address] Phone: [phone]   Fax:  [phone]  Name: Nataysha  Lavere MRN: [id number] Date of Birth: 01-26-1956

## 2018-08-13 ENCOUNTER — Ambulatory Visit (HOSPITAL_COMMUNITY): Payer: Self-pay | Admitting: Physical Therapy

## 2018-08-15 ENCOUNTER — Ambulatory Visit (HOSPITAL_COMMUNITY): Payer: Self-pay

## 2018-08-15 ENCOUNTER — Encounter (HOSPITAL_COMMUNITY): Payer: Self-pay

## 2018-08-15 DIAGNOSIS — S91301S Unspecified open wound, right foot, sequela: Secondary | ICD-10-CM

## 2018-08-15 DIAGNOSIS — R600 Localized edema: Secondary | ICD-10-CM

## 2018-08-15 DIAGNOSIS — M79671 Pain in right foot: Secondary | ICD-10-CM

## 2018-08-15 NOTE — Therapy (Signed)
[place], [place], [address] Phone: [phone]   Fax:  [phone]  Wound Care Therapy  Patient Details  Name: Suzanne Tran MRN: [id number] Date of Birth: Sep 24, 1955 No data recorded  Encounter Date: 08/15/2018  PT End of Session - 08/15/18 1001    Visit Number  8    Number of Visits  12    Date for PT Re-Evaluation  08/29/18    Authorization Type  no insurance    PT Start Time  0903    PT Stop Time  0947    PT Time Calculation (min)  44 min    Activity Tolerance  Patient tolerated treatment well;No increased pain    Behavior During Therapy  WFL for tasks assessed/performed       Past Medical History:  Diagnosis Date  . Diabetes mellitus without complication ([place])   . Hypertension   . Renal disorder     Past Surgical History:  Procedure Laterality Date  . ANKLE SURGERY    . KNEE SURGERY    . REPLACEMENT TOTAL KNEE      There were no vitals filed for this visit.   Subjective Assessment - 08/15/18 1006    Subjective  Pt reports had to change dressings due to drainage, arrived with bandage on foot.  Reports her blood sugar has been below 100 lately.                Wound Therapy - 08/15/18 1007    Subjective  Pt reports had to change dressings due to drainage, arrived with bandage on foot.  Reports her blood sugar has been below 100 lately.    Patient and Family Stated Goals  wound to heal     Date of Onset  06/20/18    Prior Treatments  self care     Pain Scale  0-10    Pain Score  0-No pain    Evaluation and Treatment Procedures Explained to Patient/Family  Yes    Evaluation and Treatment Procedures  agreed to    Wound Properties Date First Assessed: 07/18/18 Time First Assessed: 0827 Wound Type: Diabetic ulcer Location: Foot Location Orientation: Right Wound Description (Comments): plantar aspect  Present on Admission: Yes   Dressing Type  Silver hydrofiber;Gauze (Comment)    Dressing Changed  Changed    Dressing Status  Old drainage    Dressing Change Frequency  PRN    Site / Wound Assessment  Red    % Wound base Red or Granulating  100%    % Wound base Yellow/Fibrinous Exudate  0%    Peri-wound Assessment  Erythema (non-blanchable)   calloused all directions   Wound Length (cm)  2.4 cm   was 3   Wound Width (cm)  2 cm   was 2.2   Wound Depth (cm)  1.4 cm   1.4 at least in center of wound bed   Wound Volume (cm^3)  6.72 cm^3    Wound Surface Area (cm^2)  4.8 cm^2    Margins  Epibole (rolled edges)   calloused all directions   Drainage Amount  Minimal    Drainage Description  Serosanguineous    Treatment  Hydrotherapy (Pulse lavage);Cleansed;Debridement (Selective)    Pulsed lavage therapy - wound location  wound bed     Pulsed Lavage with Suction (psi)  4 psi    Pulsed Lavage with Suction - Normal Saline Used  1000 mL    Pulsed Lavage Tip  Tip with splash shield    Selective Debridement - Location  slough and callous area perimeter of wound    Selective Debridement - Tools Used  Scalpel    Selective Debridement - Tissue Removed  callous, slough from wound bed     Wound Therapy - Clinical Statement  Wound bed cleansed with PLS and selective debridement for removal of callous surrounding wound all directions.  Overall wound size has reduced in length and width, at least 1.4 cm depth in center of wound bed.  Continued with silverhydrofiber packing and gauze dressings.  No reports of pain through session.     Wound Therapy - Functional Problem List  difficulty walking; donning boot     Factors Delaying/Impairing Wound Healing  Altered sensation;Diabetes Mellitus;Multiple medical problems    Hydrotherapy Plan  Debridement;Dressing change;Patient/family education    Wound Therapy - Frequency  2X / week    Wound Therapy - Current Recommendations  PT    Wound Therapy - Follow Up Recommendations  Other (comment)   x-ray   Wound Plan  Continue treatment as  needed.  Measure weekly.    Dressing   silverhydrofiber, 4x4 , Kerlix and netting #5                PT Short Term Goals - 07/29/18 1120      PT SHORT TERM GOAL #1   Title  PT to verbalize the signs and sx of cellulitis and to contact MD ASAP     Time  1    Period  Weeks    Status  Achieved      PT SHORT TERM GOAL #2   Title  PT callous to be removed from wound to allow decrease pressure to allow wound to heal     Time  3    Period  Weeks    Status  On-going      PT SHORT TERM GOAL #3   Title  wound drainage to decrease to scant to min to allow pt to be able to don socks without soiling     Time  3    Period  Weeks    Status  On-going      PT SHORT TERM GOAL #4   Title  epiboled edges to be gone to allow wound to begin healing     Time  3    Period  Weeks    Status  On-going        PT Long Term Goals - 07/29/18 1120      PT LONG TERM GOAL #1   Title  PT wound to have no depth to allow pt to be comfortable with self care     Time  6    Period  Weeks    Status  On-going      PT LONG TERM GOAL #2   Title  PT wound length and width to have decreased at least 1 cm to demonstrate a healing environment for pt to begin self care.     Time  6    Period  Weeks    Status  On-going      PT LONG TERM GOAL #3   Title  PT edema to have decreased to facilitate donning of boot.     Time  6    Period  Weeks    Status  On-going              Patient will benefit from skilled therapeutic intervention in  order to improve the following deficits and impairments:     Visit Diagnosis: Localized edema  Pain in right foot  Open wound of right foot with complication, sequela     Problem List There are no active problems to display for this patient.  [address], LPTA; [place]  Aldona Lento 08/15/2018, 10:36 AM  [place], [place], [address] Phone: [phone]   Fax:   [phone]  Name: Suzanne Tran MRN: [id number] Date of Birth: 1955-12-29

## 2018-08-19 ENCOUNTER — Ambulatory Visit (HOSPITAL_COMMUNITY): Payer: Self-pay | Attending: Internal Medicine

## 2018-08-19 ENCOUNTER — Encounter (HOSPITAL_COMMUNITY): Payer: Self-pay

## 2018-08-19 DIAGNOSIS — R600 Localized edema: Secondary | ICD-10-CM | POA: Insufficient documentation

## 2018-08-19 DIAGNOSIS — M79671 Pain in right foot: Secondary | ICD-10-CM | POA: Insufficient documentation

## 2018-08-19 DIAGNOSIS — S91301S Unspecified open wound, right foot, sequela: Secondary | ICD-10-CM | POA: Insufficient documentation

## 2018-08-19 NOTE — Therapy (Signed)
[place], [place], [address] Phone: [phone]   Fax:  [phone]  Wound Care Therapy  Patient Details  Name: Suzanne Tran MRN: [id number] Date of Birth: 03-19-1956 No data recorded  Encounter Date: 08/19/2018  PT End of Session - 08/19/18 1701    Visit Number  9    Number of Visits  12    Date for PT Re-Evaluation  08/29/18    Authorization Type  no insurance    PT Start Time  [id number]    PT Stop Time  1430    PT Time Calculation (min)  42 min    Activity Tolerance  Patient tolerated treatment well;No increased pain    Behavior During Therapy  WFL for tasks assessed/performed       Past Medical History:  Diagnosis Date  . Diabetes mellitus without complication ([place])   . Hypertension   . Renal disorder     Past Surgical History:  Procedure Laterality Date  . ANKLE SURGERY    . KNEE SURGERY    . REPLACEMENT TOTAL KNEE      There were no vitals filed for this visit.   Subjective Assessment - 08/19/18 1447    Subjective  Pt arrived with new dressings on wounds, reoprts she had to change this morning due to drainage.  No reoprts of pain    Currently in Pain?  No/denies                Wound Therapy - 08/19/18 1447    Subjective  Pt arrived with new dressings on wounds, reoprts she had to change this morning due to drainage.  No reoprts of pain    Patient and Family Stated Goals  wound to heal     Date of Onset  06/20/18    Prior Treatments  self care     Pain Scale  0-10    Pain Score  0-No pain    Evaluation and Treatment Procedures Explained to Patient/Family  Yes    Evaluation and Treatment Procedures  agreed to    Wound Properties Date First Assessed: 07/18/18 Time First Assessed: 0827 Wound Type: Diabetic ulcer Location: Foot Location Orientation: Right Wound Description (Comments): plantar aspect  Present on Admission: Yes   Dressing Type  Silver hydrofiber;Gauze (Comment)    Dressing Changed  Changed    Dressing Status  Old drainage    Dressing Change Frequency  PRN    Site / Wound Assessment  Red    % Wound base Red or Granulating  100%    % Wound base Yellow/Fibrinous Exudate  0%    % Wound base Black/Eschar  0%    % Wound base Other/Granulation Tissue (Comment)  --   callous surrounding   Peri-wound Assessment  Erythema (non-blanchable)    Wound Length (cm)  2.4 cm   was 2.4   Wound Width (cm)  1.8 cm   was 2   Wound Depth (cm)  1 cm   was 1.4   Wound Volume (cm^3)  4.32 cm^3    Wound Surface Area (cm^2)  4.32 cm^2    Margins  Epibole (rolled edges)   From 12-5'   Drainage Amount  Minimal    Drainage Description  Serosanguineous    Treatment  Cleansed;Debridement (Selective);Hydrotherapy (Pulse lavage)    Pulsed lavage therapy - wound location  wound bed     Pulsed Lavage with Suction (psi)  4 psi    Pulsed  Lavage with Suction - Normal Saline Used  1000 mL    Pulsed Lavage Tip  Tip with splash shield    Selective Debridement - Location  mainly callous surrounding wound    Selective Debridement - Tools Used  Scalpel    Selective Debridement - Tissue Removed  callous    Wound Therapy - Clinical Statement  Wound very clean bed, continued PLS this session but can DC next session as very clean.  No odor apparant and no slough today.  Pt continues to have thick callous, majority of session wiht debridement to promote healing.  Wound size reduced and decrease in depth.  Continued packing wound with silverhydrofiber and gauze, add ABD pad for drainage.    Wound Therapy - Functional Problem List  difficulty walking; donning boot     Factors Delaying/Impairing Wound Healing  Altered sensation;Diabetes Mellitus;Multiple medical problems    Hydrotherapy Plan  Debridement;Dressing change;Patient/family education    Wound Therapy - Frequency  2X / week    Wound Therapy - Current Recommendations  PT    Wound Therapy - Follow Up Recommendations  --   x-ray    Wound Plan  Continue treatment as needed.  Measure weekly.    Dressing   silverhydrofiber, 4x4, ABD pad Kerlix and netting #5                PT Short Term Goals - 07/29/18 1120      PT SHORT TERM GOAL #1   Title  PT to verbalize the signs and sx of cellulitis and to contact MD ASAP     Time  1    Period  Weeks    Status  Achieved      PT SHORT TERM GOAL #2   Title  PT callous to be removed from wound to allow decrease pressure to allow wound to heal     Time  3    Period  Weeks    Status  On-going      PT SHORT TERM GOAL #3   Title  wound drainage to decrease to scant to min to allow pt to be able to don socks without soiling     Time  3    Period  Weeks    Status  On-going      PT SHORT TERM GOAL #4   Title  epiboled edges to be gone to allow wound to begin healing     Time  3    Period  Weeks    Status  On-going        PT Long Term Goals - 07/29/18 1120      PT LONG TERM GOAL #1   Title  PT wound to have no depth to allow pt to be comfortable with self care     Time  6    Period  Weeks    Status  On-going      PT LONG TERM GOAL #2   Title  PT wound length and width to have decreased at least 1 cm to demonstrate a healing environment for pt to begin self care.     Time  6    Period  Weeks    Status  On-going      PT LONG TERM GOAL #3   Title  PT edema to have decreased to facilitate donning of boot.     Time  6    Period  Weeks    Status  On-going  Patient will benefit from skilled therapeutic intervention in order to improve the following deficits and impairments:     Visit Diagnosis: Localized edema  Pain in right foot  Open wound of right foot with complication, sequela     Problem List There are no active problems to display for this patient.  [address] LPTA; [facility] [phone]  Aldona Lento 08/19/2018, 5:04 PM  [place] [address] [place],  [place], [address] Phone: [phone]   Fax:  [phone]  Name: Suzanne Tran MRN: [id number] Date of Birth: Nov 26, 1955

## 2018-08-21 ENCOUNTER — Encounter (HOSPITAL_COMMUNITY): Payer: Self-pay

## 2018-08-21 ENCOUNTER — Ambulatory Visit (HOSPITAL_COMMUNITY): Payer: Self-pay

## 2018-08-21 DIAGNOSIS — M79671 Pain in right foot: Secondary | ICD-10-CM

## 2018-08-21 DIAGNOSIS — R600 Localized edema: Secondary | ICD-10-CM

## 2018-08-21 DIAGNOSIS — S91301S Unspecified open wound, right foot, sequela: Secondary | ICD-10-CM

## 2018-08-21 NOTE — Therapy (Addendum)
[facility] [address] [place], [place], [address] Phone: [phone]   Fax:  [phone]  Wound Care Therapy  Patient Details  Name: Faythe CasaLoretta Wandrey MRN: [id number] Date of Birth: 10/19/1955 No data recorded  Encounter Date: 08/21/2018  Progress Note Reporting Period 07/18/18 to 08/21/18  See note below for Objective Data and Assessment of Progress/Goals.      PT End of Session - 08/21/18 1355    Visit Number  10    Number of Visits  12    Date for PT Re-Evaluation  08/29/18    Authorization Type  no insurance    PT Start Time  1303    PT Stop Time  1341    PT Time Calculation (min)  38 min    Activity Tolerance  Patient tolerated treatment well;No increased pain    Behavior During Therapy  WFL for tasks assessed/performed       Past Medical History:  Diagnosis Date  . Diabetes mellitus without complication ([facility])   . Hypertension   . Renal disorder     Past Surgical History:  Procedure Laterality Date  . ANKLE SURGERY    . KNEE SURGERY    . REPLACEMENT TOTAL KNEE      There were no vitals filed for this visit.   Subjective Assessment - 08/21/18 1348    Subjective  Pt arrived with dressings intact, has not changed dressings prior sessoin today, noted drainage through dressings    Currently in Pain?  No/denies                Wound Therapy - 08/21/18 1348    Subjective  Pt arrived with dressings intact, has not changed dressings prior sessoin today, noted drainage through dressings    Patient and Family Stated Goals  wound to heal     Date of Onset  06/20/18    Prior Treatments  self care     Pain Scale  0-10    Pain Score  0-No pain initial  pain was 5/10   Evaluation and Treatment Procedures Explained to Patient/Family  Yes    Evaluation and Treatment Procedures  agreed to    Wound Properties Date First Assessed: 07/18/18 Time First Assessed: 0827 Wound Type: Diabetic ulcer Location: Foot Location  Orientation: Right Wound Description (Comments): plantar aspect  Present on Admission: Yes   Dressing Type  Silver hydrofiber;Gauze (Comment)    Dressing Changed  Changed    Dressing Status  Old drainage    Dressing Change Frequency  PRN    Site / Wound Assessment  Red    % Wound base Red or Granulating  100%  Initial was 95%   % Wound base Yellow/Fibrinous Exudate  0%  Initial was 5%   % Wound base Black/Eschar  0%    % Wound base Other/Granulation Tissue (Comment)  --   callous surrounding   Peri-wound Assessment  Erythema (non-blanchable)    Wound Length (cm)  2.4 cm was 3.2 cm    Wound Width (cm)  1.8 cm was 1.9 cm    Wound Depth (cm)  1 cm    Wound Volume (cm^3)  4.32 cm^3    Wound Surface Area (cm^2)  4.32 cm^2    Margins  Epibole (rolled edges)   Epibole from 12-4'; unattached 12-3'   Drainage Amount  Minimal    Drainage Description  Serosanguineous    Treatment  Cleansed;Debridement (Selective)    Selective Debridement - Location  mainly callous surrounding wound    Selective Debridement - Tools Used  Scalpel    Selective Debridement - Tissue Removed  callous    Wound Therapy - Clinical Statement  Wound bed clean, minimal changes in size since last session.  DC'd PLS this sessoin.Ms. Anise SalvoReeves will continue to benefit from skilled physical therapy to ensure that a healing environment is maintained for her foot to wound to heal.  Due to being diabetic and the location of the wound the wound is healing slower than anticipatitedl.    Continued selective debridement for removal of callous surrounding wound to promote healing.  Continued packing silverhydrofiber, ABD, gauze and netting.  Added plastic bag over dressings for rain, instructed pt to removal plastic immeditely when out of rain with verbalized understanding.  No reports of pain through session.      Wound Therapy - Functional Problem List  difficulty walking; donning boot     Factors Delaying/Impairing Wound Healing  Altered  sensation;Diabetes Mellitus;Multiple medical problems    Hydrotherapy Plan  Debridement;Dressing change;Patient/family education    Wound Therapy - Frequency  2X / week  For a total of 10 weeks at which time hopefully the wound will be healed or small enough where the patient feels comfortable with self care, although pt has ha high level of anxiety.    Wound Therapy - Current Recommendations  PT    Wound Therapy - Follow Up Recommendations  --   x-ray   Wound Plan  Continue treatment as needed.  Measure weekly.    Dressing   silverhydrofiber, 4x4, ABD pad Kerlix and netting #5                PT Short Term Goals - 07/29/18 1120      PT SHORT TERM GOAL #1   Title  PT to verbalize the signs and sx of cellulitis and to contact MD ASAP     Time  1    Period  Weeks    Status  Achieved      PT SHORT TERM GOAL #2   Title  PT callous to be removed from wound to allow decrease pressure to allow wound to heal     Time  3    Period  Weeks    Status  On-going      PT SHORT TERM GOAL #3   Title  wound drainage to decrease to scant to min to allow pt to be able to don socks without soiling     Time  3    Period  Weeks    Status  On-going      PT SHORT TERM GOAL #4   Title  epiboled edges to be gone to allow wound to begin healing     Time  3    Period  Weeks    Status  On-going        PT Long Term Goals - 07/29/18 1120      PT LONG TERM GOAL #1   Title  PT wound to have no depth to allow pt to be comfortable with self care     Time  10   Period  Weeks    Status  On-going      PT LONG TERM GOAL #2   Title  PT wound length and width to have decreased at least 1 cm to demonstrate a healing environment for pt to begin self care.     Time  10   Period  Weeks    Status  On-going      PT LONG TERM GOAL #3   Title  PT edema to have decreased to facilitate donning of boot.     Time  10   Period  Weeks    Status  On-going              Patient will benefit from  skilled therapeutic intervention in order to improve the following deficits and impairments:     Visit Diagnosis: Localized edema  Pain in right foot  Open wound of right foot with complication, sequela     Problem List There are no active problems to display for this patient.  [address], LPTA; [facility] [phone]  Virgina Organ, PT CLT [phone] 08/21/2018, 1:57 PM  [facility] [address] [place], [place], [address] Phone: [phone]   Fax:  [phone]  Name: Teryn Ichikawa MRN: [id number] Date of Birth: 1955-12-07

## 2018-08-22 NOTE — Addendum Note (Signed)
Addended by: Bella Kennedy on: 08/22/2018 03:00 PM   Modules accepted: Orders

## 2018-08-26 ENCOUNTER — Ambulatory Visit (HOSPITAL_COMMUNITY): Payer: Self-pay | Admitting: Physical Therapy

## 2018-08-26 DIAGNOSIS — M79671 Pain in right foot: Secondary | ICD-10-CM

## 2018-08-26 DIAGNOSIS — R600 Localized edema: Secondary | ICD-10-CM

## 2018-08-26 DIAGNOSIS — S91301S Unspecified open wound, right foot, sequela: Secondary | ICD-10-CM

## 2018-08-26 NOTE — Therapy (Signed)
[place], [place], [address] Phone: [phone]   Fax:  [phone]  Wound Care Therapy  Patient Details  Name: Suzanne Tran MRN: [id number] Date of Birth: 12-18-1955 No data recorded  Encounter Date: 08/26/2018  PT End of Session - 08/26/18 1858    Visit Number  11    Number of Visits  20    Date for PT Re-Evaluation  08/29/18    Authorization Type  no insurance    PT Start Time  1435    PT Stop Time  1515    PT Time Calculation (min)  40 min    Activity Tolerance  Patient tolerated treatment well;No increased pain    Behavior During Therapy  WFL for tasks assessed/performed       Past Medical History:  Diagnosis Date  . Diabetes mellitus without complication ([facility])   . Hypertension   . Renal disorder     Past Surgical History:  Procedure Laterality Date  . ANKLE SURGERY    . KNEE SURGERY    . REPLACEMENT TOTAL KNEE      There were no vitals filed for this visit.              Wound Therapy - 08/26/18 1854    Subjective  Pt arrived with dressings intact, has not changed dressings prior sessoin today, noted drainage through dressings    Patient and Family Stated Goals  wound to heal     Date of Onset  06/20/18    Prior Treatments  self care     Pain Scale  0-10    Pain Score  0-No pain    Evaluation and Treatment Procedures Explained to Patient/Family  Yes    Evaluation and Treatment Procedures  agreed to    Wound Properties Date First Assessed: 07/18/18 Time First Assessed: 0827 Wound Type: Diabetic ulcer Location: Foot Location Orientation: Right Wound Description (Comments): plantar aspect  Present on Admission: Yes   Dressing Type  Silver hydrofiber;Gauze (Comment)    Dressing Changed  Changed    Dressing Status  Old drainage    Dressing Change Frequency  PRN    Site / Wound Assessment  Red    % Wound base Red or Granulating  100%    % Wound base Yellow/Fibrinous Exudate  0%     % Wound base Black/Eschar  0%    % Wound base Other/Granulation Tissue (Comment)  --   callous surrounding   Peri-wound Assessment  Erythema (non-blanchable)    Wound Length (cm)  2.4 cm    Wound Width (cm)  1.8 cm    Wound Depth (cm)  1 cm    Wound Volume (cm^3)  4.32 cm^3    Wound Surface Area (cm^2)  4.32 cm^2    Margins  Epibole (rolled edges)   Epibole from 12-4'; unattached 12-3'   Drainage Amount  Minimal    Drainage Description  Serosanguineous    Treatment  Cleansed;Debridement (Selective)    Selective Debridement - Location  mainly callous surrounding wound    Selective Debridement - Tools Used  Scalpel    Selective Debridement - Tissue Removed  callous    Wound Therapy - Clinical Statement  wound bed remains 100% granulatied and clean.  1 cm in length "crater" running the length of wound that has depth of 1cm.  Also with some undermining around superiolateral edge of wound.  Cleansed and packed this well wiht silver hydrofiber.  Discussed  with pateint the need for an orthotic consult to get the proper shoes, cusomized, moving forward.     Wound Therapy - Functional Problem List  difficulty walking; donning boot     Factors Delaying/Impairing Wound Healing  Altered sensation;Diabetes Mellitus;Multiple medical problems    Hydrotherapy Plan  Debridement;Dressing change;Patient/family education    Wound Therapy - Frequency  2X / week    Wound Therapy - Current Recommendations  PT    Wound Therapy - Follow Up Recommendations  --   x-ray   Wound Plan  Continue treatment as needed.  Measure weekly.    Dressing   silverhydrofiber, 4x4, ABD pad Kerlix and netting #5                PT Short Term Goals - 07/29/18 1120      PT SHORT TERM GOAL #1   Title  PT to verbalize the signs and sx of cellulitis and to contact MD ASAP     Time  1    Period  Weeks    Status  Achieved      PT SHORT TERM GOAL #2   Title  PT callous to be removed from wound to allow decrease  pressure to allow wound to heal     Time  3    Period  Weeks    Status  On-going      PT SHORT TERM GOAL #3   Title  wound drainage to decrease to scant to min to allow pt to be able to don socks without soiling     Time  3    Period  Weeks    Status  On-going      PT SHORT TERM GOAL #4   Title  epiboled edges to be gone to allow wound to begin healing     Time  3    Period  Weeks    Status  On-going        PT Long Term Goals - 07/29/18 1120      PT LONG TERM GOAL #1   Title  PT wound to have no depth to allow pt to be comfortable with self care     Time  6    Period  Weeks    Status  On-going      PT LONG TERM GOAL #2   Title  PT wound length and width to have decreased at least 1 cm to demonstrate a healing environment for pt to begin self care.     Time  6    Period  Weeks    Status  On-going      PT LONG TERM GOAL #3   Title  PT edema to have decreased to facilitate donning of boot.     Time  6    Period  Weeks    Status  On-going              Patient will benefit from skilled therapeutic intervention in order to improve the following deficits and impairments:     Visit Diagnosis: Localized edema  Pain in right foot  Open wound of right foot with complication, sequela     Problem List There are no active problems to display for this patient.  [name], PTA/CLT [phone]  [name] 08/26/2018, 6:59 PM  [facility] [address] [place], [place], [address] Phone: [phone]   Fax:  [phone]  Name: Modena Steedley MRN: [id number] Date  of Birth: Jul 04, 1956

## 2018-08-28 ENCOUNTER — Ambulatory Visit (HOSPITAL_COMMUNITY): Payer: Self-pay | Admitting: Physical Therapy

## 2018-08-28 DIAGNOSIS — R600 Localized edema: Secondary | ICD-10-CM

## 2018-08-28 DIAGNOSIS — M79671 Pain in right foot: Secondary | ICD-10-CM

## 2018-08-28 DIAGNOSIS — S91301S Unspecified open wound, right foot, sequela: Secondary | ICD-10-CM

## 2018-08-28 NOTE — Therapy (Signed)
[facility] [address] [place], [place], [address] Phone: [phone]   Fax:  [phone]  Physical Therapy Treatment  Patient Details  Name: Suzanne Tran MRN: [id number] Date of Birth: 1955-10-27 No data recorded  Encounter Date: 08/28/2018  PT End of Session - 08/28/18 1626    Visit Number  12    Number of Visits  20    Date for PT Re-Evaluation  08/29/18    Authorization Type  no insurance    PT Start Time  1350    PT Stop Time  1430    PT Time Calculation (min)  40 min    Activity Tolerance  Patient tolerated treatment well;No increased pain    Behavior During Therapy  WFL for tasks assessed/performed       Past Medical History:  Diagnosis Date  . Diabetes mellitus without complication ([facility])   . Hypertension   . Renal disorder     Past Surgical History:  Procedure Laterality Date  . ANKLE SURGERY    . KNEE SURGERY    . REPLACEMENT TOTAL KNEE      There were no vitals filed for this visit.                  Wound Therapy - 08/28/18 1617    Subjective  pt reports she did not remove or change her dressing.    Patient and Family Stated Goals  wound to heal     Date of Onset  06/20/18    Prior Treatments  self care     Pain Scale  0-10    Pain Score  0-No pain    Evaluation and Treatment Procedures Explained to Patient/Family  Yes    Evaluation and Treatment Procedures  agreed to    Wound Properties Date First Assessed: 07/18/18 Time First Assessed: 0827 Wound Type: Diabetic ulcer Location: Foot Location Orientation: Right Wound Description (Comments): plantar aspect  Present on Admission: Yes   Dressing Type  Silver hydrofiber;Gauze (Comment)    Dressing Changed  Changed    Dressing Status  Old drainage    Dressing Change Frequency  PRN    Site / Wound Assessment  Red    % Wound base Red or Granulating  100%    % Wound base Yellow/Fibrinous Exudate  0%    % Wound base Black/Eschar  0%    % Wound  base Other/Granulation Tissue (Comment)  --   callous surrounding   Peri-wound Assessment  Erythema (non-blanchable)    Margins  Epibole (rolled edges)   Epibole from 12-4'; unattached 12-3'   Drainage Amount  Minimal    Drainage Description  Serosanguineous    Treatment  Cleansed;Debridement (Selective)    Selective Debridement - Location  mainly callous surrounding wound    Selective Debridement - Tools Used  Scalpel    Selective Debridement - Tissue Removed  callous    Wound Therapy - Clinical Statement  continued depth with crater running across wound centrally.  No odor present and with serous drainage.  Cleansed well and debrided undermining from superior border.  Packed with silver hydrofiber and secured with medipore.  ABD placed and additional kerlix for drainage.      Wound Therapy - Functional Problem List  difficulty walking; donning boot     Factors Delaying/Impairing Wound Healing  Altered sensation;Diabetes Mellitus;Multiple medical problems    Hydrotherapy Plan  Debridement;Dressing change;Patient/family education    Wound Therapy - Frequency  2X / week  Wound Therapy - Current Recommendations  PT    Wound Therapy - Follow Up Recommendations  --   x-ray   Wound Plan  Continue treatment as needed.  Measure weekly.    Dressing   silverhydrofiber, 4x4, ABD pad Kerlix and netting #5                 PT Short Term Goals - 07/29/18 1120      PT SHORT TERM GOAL #1   Title  PT to verbalize the signs and sx of cellulitis and to contact MD ASAP     Time  1    Period  Weeks    Status  Achieved      PT SHORT TERM GOAL #2   Title  PT callous to be removed from wound to allow decrease pressure to allow wound to heal     Time  3    Period  Weeks    Status  On-going      PT SHORT TERM GOAL #3   Title  wound drainage to decrease to scant to min to allow pt to be able to don socks without soiling     Time  3    Period  Weeks    Status  On-going      PT SHORT TERM  GOAL #4   Title  epiboled edges to be gone to allow wound to begin healing     Time  3    Period  Weeks    Status  On-going        PT Long Term Goals - 07/29/18 1120      PT LONG TERM GOAL #1   Title  PT wound to have no depth to allow pt to be comfortable with self care     Time  6    Period  Weeks    Status  On-going      PT LONG TERM GOAL #2   Title  PT wound length and width to have decreased at least 1 cm to demonstrate a healing environment for pt to begin self care.     Time  6    Period  Weeks    Status  On-going      PT LONG TERM GOAL #3   Title  PT edema to have decreased to facilitate donning of boot.     Time  6    Period  Weeks    Status  On-going              Patient will benefit from skilled therapeutic intervention in order to improve the following deficits and impairments:     Visit Diagnosis: Localized edema  Pain in right foot  Open wound of right foot with complication, sequela     Problem List There are no active problems to display for this patient.  Lurena Nida, PTA/CLT [phone]  Lurena Nida 08/28/2018, 4:28 PM  [facility] [address] [place], [place], [address] Phone: [phone]   Fax:  [phone]  Name: Cina Schnall MRN: [id number] Date of Birth: 10/07/1955

## 2018-09-02 ENCOUNTER — Ambulatory Visit (HOSPITAL_COMMUNITY): Payer: Self-pay | Admitting: Physical Therapy

## 2018-09-02 ENCOUNTER — Encounter (HOSPITAL_COMMUNITY): Payer: Self-pay | Admitting: Physical Therapy

## 2018-09-02 DIAGNOSIS — S91301S Unspecified open wound, right foot, sequela: Secondary | ICD-10-CM

## 2018-09-02 DIAGNOSIS — R600 Localized edema: Secondary | ICD-10-CM

## 2018-09-02 DIAGNOSIS — M79671 Pain in right foot: Secondary | ICD-10-CM

## 2018-09-02 NOTE — Therapy (Signed)
[facility] [address] [place], [place], [address] Phone: [phone]   Fax:  [phone]  Wound Care Therapy  Patient Details  Name: Suzanne Tran MRN: [id number] Date of Birth: 1955-09-11 No data recorded  Encounter Date: 09/02/2018  PT End of Session - 09/02/18 1440    Visit Number  13    Number of Visits  20    Date for PT Re-Evaluation  08/29/18    Authorization Type  no insurance    PT Start Time  1400    PT Stop Time  1430    PT Time Calculation (min)  30 min    Activity Tolerance  Patient tolerated treatment well;No increased pain    Behavior During Therapy  WFL for tasks assessed/performed       Past Medical History:  Diagnosis Date  . Diabetes mellitus without complication ([facility])   . Hypertension   . Renal disorder     Past Surgical History:  Procedure Laterality Date  . ANKLE SURGERY    . KNEE SURGERY    . REPLACEMENT TOTAL KNEE      There were no vitals filed for this visit.              Wound Therapy - 09/02/18 1433    Subjective  PT 15 minutes late stating that she was dealing with family issues     Patient and Family Stated Goals  wound to heal     Date of Onset  06/20/18    Prior Treatments  self care     Evaluation and Treatment Procedures Explained to Patient/Family  Yes    Evaluation and Treatment Procedures  agreed to    Wound Properties Date First Assessed: 07/18/18 Time First Assessed: 0827 Wound Type: Diabetic ulcer Location: Foot Location Orientation: Right Wound Description (Comments): plantar aspect  Present on Admission: Yes   Dressing Type  Silver hydrofiber;Gauze (Comment)    Dressing Changed  Changed    Dressing Status  Old drainage    Dressing Change Frequency  PRN    Site / Wound Assessment  Red    % Wound base Red or Granulating  95%    % Wound base Yellow/Fibrinous Exudate  5%    % Wound base Black/Eschar  0%    % Wound base Other/Granulation Tissue (Comment)  --    callous surrounding   Peri-wound Assessment  Erythema (non-blanchable);Maceration    Wound Length (cm)  2.7 cm   was 2.4 increased due to debridement of callous around wound   Wound Width (cm)  1.6 cm    Wound Depth (cm)  1.5 cm   tunnel in creaves is 1.5; creaves 1   Wound Volume (cm^3)  6.48 cm^3    Wound Surface Area (cm^2)  4.32 cm^2    Margins  Epibole (rolled edges)   Epibole from 12-4'; unattached 12-3'   Drainage Amount  Moderate    Drainage Description  Serosanguineous;Odor    Treatment  Cleansed;Debridement (Selective)    Selective Debridement - Location  Slough from tunnel inside creaves,epiboled edges and callous surrounding wound    Selective Debridement - Tools Used  Forceps;Scissors    Selective Debridement - Tissue Removed  callous    Wound Therapy - Clinical Statement  PT did not change dressing despite fact that draiage was notably visible from outer dressing.  Explained to pt that she was self caring for the wound prior to coming to therapy and she should continue to  do so; when drainage is visible it is time to wash, dry and redress wound.  Noted maceration around wound.  Wound measured from measurments it appears that the wound has increased in size but therapist believes it is due to debriding the callous and epiboled edges rather than the wound is increasing in size.      Wound Therapy - Functional Problem List  difficulty walking; donning boot     Factors Delaying/Impairing Wound Healing  Altered sensation;Diabetes Mellitus;Multiple medical problems    Hydrotherapy Plan  Debridement;Dressing change;Patient/family education    Wound Therapy - Frequency  2X / week    Wound Therapy - Current Recommendations  PT    Wound Therapy - Follow Up Recommendations  --   x-ray   Wound Plan  Continue treatment as needed.  Measure weekly.    Dressing   silverhydrofiber, 4x4, ABD pad Kerlix and netting #5                PT Short Term Goals - 07/29/18 1120      PT  SHORT TERM GOAL #1   Title  PT to verbalize the signs and sx of cellulitis and to contact MD ASAP     Time  1    Period  Weeks    Status  Achieved      PT SHORT TERM GOAL #2   Title  PT callous to be removed from wound to allow decrease pressure to allow wound to heal     Time  3    Period  Weeks    Status  On-going      PT SHORT TERM GOAL #3   Title  wound drainage to decrease to scant to min to allow pt to be able to don socks without soiling     Time  3    Period  Weeks    Status  On-going      PT SHORT TERM GOAL #4   Title  epiboled edges to be gone to allow wound to begin healing     Time  3    Period  Weeks    Status  On-going        PT Long Term Goals - 07/29/18 1120      PT LONG TERM GOAL #1   Title  PT wound to have no depth to allow pt to be comfortable with self care     Time  6    Period  Weeks    Status  On-going      PT LONG TERM GOAL #2   Title  PT wound length and width to have decreased at least 1 cm to demonstrate a healing environment for pt to begin self care.     Time  6    Period  Weeks    Status  On-going      PT LONG TERM GOAL #3   Title  PT edema to have decreased to facilitate donning of boot.     Time  6    Period  Weeks    Status  On-going            Plan - 09/02/18 1441    Clinical Impression Statement  as above     Rehab Potential  Fair    PT Frequency  2x / week    PT Duration  6 weeks    PT Treatment/Interventions  ADLs/Self Care Home Management;Patient/family education;Other (comment)   debridement/dressing change   PT Next  Visit Plan  see above       Patient will benefit from skilled therapeutic intervention in order to improve the following deficits and impairments:  Pain, Decreased skin integrity, Difficulty walking, Increased edema  Visit Diagnosis: Localized edema  Pain in right foot  Open wound of right foot with complication, sequela     Problem List There are no active problems to display for this  patient.   Virgina OrganCynthia Satoya Feeley, PT CLT [phone] 09/02/2018, 2:42 PM  [facility] [address] [place], [place], [address] Phone: [phone]   Fax:  [phone]  Name: Suzanne Tran MRN: [id number] Date of Birth: 12-Jun-1956

## 2018-09-04 ENCOUNTER — Ambulatory Visit (HOSPITAL_COMMUNITY): Payer: Self-pay | Admitting: Physical Therapy

## 2018-09-05 ENCOUNTER — Encounter (HOSPITAL_COMMUNITY): Payer: Self-pay | Admitting: Physical Therapy

## 2018-09-05 ENCOUNTER — Ambulatory Visit (HOSPITAL_COMMUNITY): Payer: Self-pay | Admitting: Physical Therapy

## 2018-09-05 DIAGNOSIS — R600 Localized edema: Secondary | ICD-10-CM

## 2018-09-05 DIAGNOSIS — S91301S Unspecified open wound, right foot, sequela: Secondary | ICD-10-CM

## 2018-09-05 NOTE — Therapy (Signed)
[place], [place], [address] Phone: [phone]   Fax:  [phone]  Wound Care Therapy  Patient Details  Name: Suzanne Tran MRN: [id number] Date of Birth: [birth year] No data recorded  Encounter Date: 09/05/2018  PT End of Session - 09/05/18 1209    Visit Number  14    Number of Visits  20    Date for PT Re-Evaluation  08/29/18    Authorization Type  no insurance    PT Start Time  [phone]    PT Stop Time  1030    PT Time Calculation (min)  40 min    Activity Tolerance  Patient tolerated treatment well;No increased pain    Behavior During Therapy  WFL for tasks assessed/performed       Past Medical History:  Diagnosis Date  . Diabetes mellitus without complication ([place])   . Hypertension   . Renal disorder     Past Surgical History:  Procedure Laterality Date  . ANKLE SURGERY    . KNEE SURGERY    . REPLACEMENT TOTAL KNEE      There were no vitals filed for this visit.              Wound Therapy - 09/05/18 1206    Subjective  PT has no complaints states that she did change her dressing this time.    Patient and Family Stated Goals  wound to heal     Date of Onset  06/20/18    Prior Treatments  self care     Evaluation and Treatment Procedures Explained to Patient/Family  Yes    Evaluation and Treatment Procedures  agreed to    Wound Properties Date First Assessed: 07/18/18 Time First Assessed: 0827 Wound Type: Diabetic ulcer Location: Foot Location Orientation: Right Wound Description (Comments): plantar aspect  Present on Admission: Yes   Dressing Type  Silver hydrofiber;Gauze (Comment)    Dressing Changed  Changed    Dressing Status  Old drainage    Dressing Change Frequency  PRN    Site / Wound Assessment  Red    % Wound base Red or Granulating  95%    % Wound base Yellow/Fibrinous Exudate  5%    % Wound base Black/Eschar  0%    % Wound base Other/Granulation Tissue (Comment)  --    callous surrounding   Peri-wound Assessment  Erythema (non-blanchable);Maceration    Margins  Epibole (rolled edges)   Epibole from 12-4'; unattached 12-3'   Drainage Amount  Moderate    Drainage Description  Serosanguineous;Odor    Treatment  Cleansed;Debridement (Selective)    Selective Debridement - Location  Slough from tunnel inside creaves,epiboled edges and callous surrounding wound    Selective Debridement - Tools Used  Forceps;Scissors    Selective Debridement - Tissue Removed  callous    Wound Therapy - Clinical Statement  Significant amount of callous was able to be removed from perimeter of wound today.  Silverhydrofiber pushed into depth of wound followed by a second piece placed in the wound.  Pt encouraged to see an orthotist for a custom shoe, however, pt states that she can not afford to at this time.     Wound Therapy - Functional Problem List  difficulty walking; donning boot     Factors Delaying/Impairing Wound Healing  Altered sensation;Diabetes Mellitus;Multiple medical problems    Hydrotherapy Plan  Debridement;Dressing change;Patient/family education    Wound Therapy - Frequency  2X / week  Wound Therapy - Current Recommendations  PT    Wound Therapy - Follow Up Recommendations  --   x-ray   Wound Plan  Continue treatment as needed.  Measure weekly.    Dressing   silverhydrofiber, 4x4, ABD pad Kerlix and netting #5                PT Short Term Goals - 07/29/18 1120      PT SHORT TERM GOAL #1   Title  PT to verbalize the signs and sx of cellulitis and to contact MD ASAP     Time  1    Period  Weeks    Status  Achieved      PT SHORT TERM GOAL #2   Title  PT callous to be removed from wound to allow decrease pressure to allow wound to heal     Time  3    Period  Weeks    Status  On-going      PT SHORT TERM GOAL #3   Title  wound drainage to decrease to scant to min to allow pt to be able to don socks without soiling     Time  3    Period   Weeks    Status  On-going      PT SHORT TERM GOAL #4   Title  epiboled edges to be gone to allow wound to begin healing     Time  3    Period  Weeks    Status  On-going        PT Long Term Goals - 07/29/18 1120      PT LONG TERM GOAL #1   Title  PT wound to have no depth to allow pt to be comfortable with self care     Time  6    Period  Weeks    Status  On-going      PT LONG TERM GOAL #2   Title  PT wound length and width to have decreased at least 1 cm to demonstrate a healing environment for pt to begin self care.     Time  6    Period  Weeks    Status  On-going      PT LONG TERM GOAL #3   Title  PT edema to have decreased to facilitate donning of boot.     Time  6    Period  Weeks    Status  On-going              Patient will benefit from skilled therapeutic intervention in order to improve the following deficits and impairments:     Visit Diagnosis: Localized edema  Open wound of right foot with complication, sequela     Problem List There are no active problems to display for this patient.  Rayetta Humphrey, PT CLT [phone] 09/05/2018, 12:09 PM  [place], [place], [address] Phone: [phone]   Fax:  [phone]  Name: Suzanne Tran MRN: [id number] Date of Birth: [birth year]

## 2018-09-09 ENCOUNTER — Encounter (HOSPITAL_COMMUNITY): Payer: Self-pay | Admitting: Physical Therapy

## 2018-09-09 ENCOUNTER — Ambulatory Visit (HOSPITAL_COMMUNITY): Payer: Self-pay | Admitting: Physical Therapy

## 2018-09-09 DIAGNOSIS — M79671 Pain in right foot: Secondary | ICD-10-CM

## 2018-09-09 DIAGNOSIS — R600 Localized edema: Secondary | ICD-10-CM

## 2018-09-09 DIAGNOSIS — S91301S Unspecified open wound, right foot, sequela: Secondary | ICD-10-CM

## 2018-09-09 NOTE — Therapy (Signed)
[place], [place], [address] Phone: [phone]   Fax:  [phone]  Wound Care Therapy  Patient Details  Name: Suzanne Tran MRN: [id number] Date of Birth: 09-19-1955 No data recorded  Encounter Date: 09/09/2018  PT End of Session - 09/09/18 1443    Visit Number  15    Number of Visits  20    Date for PT Re-Evaluation  08/29/18    Authorization Type  no insurance; cert [id number]    PT Start Time  1350    PT Stop Time  1425    PT Time Calculation (min)  35 min    Activity Tolerance  Patient tolerated treatment well;No increased pain    Behavior During Therapy  WFL for tasks assessed/performed       Past Medical History:  Diagnosis Date  . Diabetes mellitus without complication ([place])   . Hypertension   . Renal disorder     Past Surgical History:  Procedure Laterality Date  . ANKLE SURGERY    . KNEE SURGERY    . REPLACEMENT TOTAL KNEE      There were no vitals filed for this visit.              Wound Therapy - 09/09/18 1432    Subjective  pt states she washed and changed her dressing yesterday as instructed by the previous therapist.  No issues today.    Patient and Family Stated Goals  wound to heal     Date of Onset  06/20/18    Prior Treatments  self care     Evaluation and Treatment Procedures Explained to Patient/Family  Yes    Evaluation and Treatment Procedures  agreed to    Wound Properties Date First Assessed: 07/18/18 Time First Assessed: 0827 Wound Type: Diabetic ulcer Location: Foot Location Orientation: Right Wound Description (Comments): plantar aspect  Present on Admission: Yes   Dressing Type  Silver hydrofiber;Gauze (Comment)    Dressing Changed  Changed    Dressing Status  Old drainage    Dressing Change Frequency  PRN    Site / Wound Assessment  Red    % Wound base Red or Granulating  95%    % Wound base Yellow/Fibrinous Exudate  5%    % Wound base Black/Eschar  0%     % Wound base Other/Granulation Tissue (Comment)  --   callous surrounding   Peri-wound Assessment  Erythema (non-blanchable);Maceration    Wound Length (cm)  2 cm    Wound Width (cm)  1.4 cm    Wound Depth (cm)  1 cm    Wound Volume (cm^3)  2.8 cm^3    Wound Surface Area (cm^2)  2.8 cm^2    Tunneling (cm)  crevis 1.2cm depth (was 1.5) and 1 cm in length.    Undermining (cm)  at 8-10 O'clock    Margins  Epibole (rolled edges)    Drainage Amount  Moderate    Drainage Description  Serosanguineous;Odor    Treatment  Cleansed;Debridement (Selective)    Selective Debridement - Location  Slough from tunnel inside creaves,epiboled edges and callous surrounding wound    Selective Debridement - Tools Used  Forceps;Scissors    Selective Debridement - Tissue Removed  callous    Wound Therapy - Clinical Statement  overall improving according to measurements and overall appearance of wound bed. instructed to leave packing if soaked through and only change/replace outside bandaging to keep bacteria from entering wound  bed (pt does not own gloves or have sterile gauze).  Pt verbalized understanding.  Will try to change appt moving forward to monday and thursday to even out days between woundcare.  Debrided undermining lip at 8-10 O;clock to reveal more tissue to approximate.  Will ned orthotic at some point or wound will return without pressure relief.      Wound Therapy - Functional Problem List  difficulty walking; donning boot     Factors Delaying/Impairing Wound Healing  Altered sensation;Diabetes Mellitus;Multiple medical problems    Hydrotherapy Plan  Debridement;Dressing change;Patient/family education    Wound Therapy - Frequency  2X / week    Wound Therapy - Current Recommendations  PT    Wound Therapy - Follow Up Recommendations  --   x-ray   Wound Plan  Continue treatment as needed.  Measure weekly.    Dressing   silverhydrofiber, 4x4, ABD pad Kerlix and netting #5                 PT Short Term Goals - 07/29/18 1120      PT SHORT TERM GOAL #1   Title  PT to verbalize the signs and sx of cellulitis and to contact MD ASAP     Time  1    Period  Weeks    Status  Achieved      PT SHORT TERM GOAL #2   Title  PT callous to be removed from wound to allow decrease pressure to allow wound to heal     Time  3    Period  Weeks    Status  On-going      PT SHORT TERM GOAL #3   Title  wound drainage to decrease to scant to min to allow pt to be able to don socks without soiling     Time  3    Period  Weeks    Status  On-going      PT SHORT TERM GOAL #4   Title  epiboled edges to be gone to allow wound to begin healing     Time  3    Period  Weeks    Status  On-going        PT Long Term Goals - 07/29/18 1120      PT LONG TERM GOAL #1   Title  PT wound to have no depth to allow pt to be comfortable with self care     Time  6    Period  Weeks    Status  On-going      PT LONG TERM GOAL #2   Title  PT wound length and width to have decreased at least 1 cm to demonstrate a healing environment for pt to begin self care.     Time  6    Period  Weeks    Status  On-going      PT LONG TERM GOAL #3   Title  PT edema to have decreased to facilitate donning of boot.     Time  6    Period  Weeks    Status  On-going              Patient will benefit from skilled therapeutic intervention in order to improve the following deficits and impairments:     Visit Diagnosis: Localized edema  Pain in right foot  Open wound of right foot with complication, sequela     Problem List There are no active problems to display for  this patient.   [name], PTA/CLT [phone]   [name] 09/09/2018, 2:44 PM  [facility] [address] [place], [place], [address] Phone: [phone]   Fax:  [phone]  Name: Suzanne Tran MRN: [id number] Date of Birth: Dec 30, 1955

## 2018-09-11 ENCOUNTER — Encounter (HOSPITAL_COMMUNITY): Payer: Self-pay | Admitting: Physical Therapy

## 2018-09-11 ENCOUNTER — Ambulatory Visit (HOSPITAL_COMMUNITY): Payer: Self-pay | Admitting: Physical Therapy

## 2018-09-11 DIAGNOSIS — R600 Localized edema: Secondary | ICD-10-CM

## 2018-09-11 DIAGNOSIS — S91301S Unspecified open wound, right foot, sequela: Secondary | ICD-10-CM

## 2018-09-11 NOTE — Therapy (Signed)
[place], [place], [address] Phone: [phone]   Fax:  [phone]  Wound Care Therapy  Patient Details  Name: Suzanne Tran MRN: [id number] Date of Birth: 05-31-1956 No data recorded  Encounter Date: 09/11/2018  PT End of Session - 09/11/18 1836    Visit Number  16    Number of Visits  20    Date for PT Re-Evaluation  08/29/18    Authorization Type  no insurance; cert [id number]    PT Start Time  1350    PT Stop Time  1420    PT Time Calculation (min)  30 min    Activity Tolerance  Patient tolerated treatment well;No increased pain    Behavior During Therapy  WFL for tasks assessed/performed       Past Medical History:  Diagnosis Date  . Diabetes mellitus without complication ([place])   . Hypertension   . Renal disorder     Past Surgical History:  Procedure Laterality Date  . ANKLE SURGERY    . KNEE SURGERY    . REPLACEMENT TOTAL KNEE      There were no vitals filed for this visit.              Wound Therapy - 09/11/18 1834    Subjective  pt states she had to remove her bandages yesterday and replace it due to drainage coming through the bandaging.     Patient and Family Stated Goals  wound to heal     Date of Onset  06/20/18    Prior Treatments  self care     Evaluation and Treatment Procedures Explained to Patient/Family  Yes    Evaluation and Treatment Procedures  agreed to    Wound Properties Date First Assessed: 07/18/18 Time First Assessed: 0827 Wound Type: Diabetic ulcer Location: Foot Location Orientation: Right Wound Description (Comments): plantar aspect  Present on Admission: Yes   Dressing Type  Silver hydrofiber;Gauze (Comment)    Dressing Changed  Changed    Dressing Status  Old drainage    Dressing Change Frequency  PRN    Site / Wound Assessment  Red    % Wound base Red or Granulating  95%    % Wound base Yellow/Fibrinous Exudate  5%    % Wound base Black/Eschar  0%     % Wound base Other/Granulation Tissue (Comment)  --   callous surrounding   Peri-wound Assessment  Erythema (non-blanchable);Maceration    Margins  Epibole (rolled edges)    Drainage Amount  Moderate    Drainage Description  Serosanguineous;Odor    Treatment  Cleansed;Debridement (Selective)    Selective Debridement - Location  Slough from tunnel inside creaves,epiboled edges and callous surrounding wound    Selective Debridement - Tools Used  Forceps;Scissors    Selective Debridement - Tissue Removed  callous    Wound Therapy - Clinical Statement  contineud to debride outer epibole edges.  Remains 100%granulated inside of wound bed with decreasing depth of wound.  Outer edeges macerated this session requested pateint use other bandage besides bandaides as this traps moisture.  Cleansed well and continued to pack with silver hydrofiber and gauze.     Wound Therapy - Functional Problem List  difficulty walking; donning boot     Factors Delaying/Impairing Wound Healing  Altered sensation;Diabetes Mellitus;Multiple medical problems    Hydrotherapy Plan  Debridement;Dressing change;Patient/family education    Wound Therapy - Frequency  2X / week  Wound Therapy - Current Recommendations  PT    Wound Therapy - Follow Up Recommendations  --   x-ray   Wound Plan  Continue treatment as needed.  Measure weekly.    Dressing   silverhydrofiber, 4x4, ABD pad Kerlix and netting #5                PT Short Term Goals - 07/29/18 1120      PT SHORT TERM GOAL #1   Title  PT to verbalize the signs and sx of cellulitis and to contact MD ASAP     Time  1    Period  Weeks    Status  Achieved      PT SHORT TERM GOAL #2   Title  PT callous to be removed from wound to allow decrease pressure to allow wound to heal     Time  3    Period  Weeks    Status  On-going      PT SHORT TERM GOAL #3   Title  wound drainage to decrease to scant to min to allow pt to be able to don socks without soiling      Time  3    Period  Weeks    Status  On-going      PT SHORT TERM GOAL #4   Title  epiboled edges to be gone to allow wound to begin healing     Time  3    Period  Weeks    Status  On-going        PT Long Term Goals - 07/29/18 1120      PT LONG TERM GOAL #1   Title  PT wound to have no depth to allow pt to be comfortable with self care     Time  6    Period  Weeks    Status  On-going      PT LONG TERM GOAL #2   Title  PT wound length and width to have decreased at least 1 cm to demonstrate a healing environment for pt to begin self care.     Time  6    Period  Weeks    Status  On-going      PT LONG TERM GOAL #3   Title  PT edema to have decreased to facilitate donning of boot.     Time  6    Period  Weeks    Status  On-going              Patient will benefit from skilled therapeutic intervention in order to improve the following deficits and impairments:     Visit Diagnosis: Localized edema  Open wound of right foot with complication, sequela     Problem List There are no active problems to display for this patient.  [name], PTA/CLT [phone]  [name] 09/11/2018, 6:38 PM  [facility] [address] [place], [place], [address] Phone: [phone]   Fax:  [phone]  Name: Suzanne Tran MRN: [id number] Date of Birth: 11-Aug-1955

## 2018-09-16 ENCOUNTER — Ambulatory Visit (HOSPITAL_COMMUNITY): Payer: Self-pay | Attending: Internal Medicine | Admitting: Physical Therapy

## 2018-09-16 DIAGNOSIS — M79671 Pain in right foot: Secondary | ICD-10-CM | POA: Insufficient documentation

## 2018-09-16 DIAGNOSIS — S91301S Unspecified open wound, right foot, sequela: Secondary | ICD-10-CM | POA: Insufficient documentation

## 2018-09-16 DIAGNOSIS — R600 Localized edema: Secondary | ICD-10-CM | POA: Insufficient documentation

## 2018-09-16 NOTE — Therapy (Signed)
[place], [place], [address] Phone: [phone]   Fax:  [phone]  Wound Care Therapy  Patient Details  Name: Suzanne Tran MRN: [id number] Date of Birth: Mar 31, 1956 No data recorded  Encounter Date: 09/16/2018    Past Medical History:  Diagnosis Date  . Diabetes mellitus without complication ([place])   . Hypertension   . Renal disorder     Past Surgical History:  Procedure Laterality Date  . ANKLE SURGERY    . KNEE SURGERY    . REPLACEMENT TOTAL KNEE      There were no vitals filed for this visit.              Wound Therapy - 09/16/18 1436    Subjective  pt states she had to remove her bandages yesterday and replace it due to drainage coming through the bandaging.     Patient and Family Stated Goals  wound to heal     Date of Onset  06/20/18    Prior Treatments  self care     Evaluation and Treatment Procedures Explained to Patient/Family  Yes    Evaluation and Treatment Procedures  agreed to    Wound Properties Date First Assessed: 07/18/18 Time First Assessed: 0827 Wound Type: Diabetic ulcer Location: Foot Location Orientation: Right Wound Description (Comments): plantar aspect  Present on Admission: Yes   Dressing Type  Silver hydrofiber;Gauze (Comment)    Dressing Status  Old drainage    Dressing Change Frequency  PRN    Site / Wound Assessment  Red    % Wound base Red or Granulating  95%    % Wound base Yellow/Fibrinous Exudate  5%    % Wound base Black/Eschar  0%    % Wound base Other/Granulation Tissue (Comment)  --   callous surrounding   Peri-wound Assessment  Erythema (non-blanchable);Maceration    Wound Length (cm)  2 cm    Wound Width (cm)  1.6 cm    Wound Depth (cm)  0.6 cm    Wound Volume (cm^3)  1.92 cm^3    Wound Surface Area (cm^2)  3.2 cm^2    Tunneling (cm)  crevis 0.6 cm deep, 1.2cm from surface (crevis is 0.6cm down into wound    Undermining (cm)  at 12 O'clock     Margins  Epibole (rolled edges)    Drainage Amount  Moderate    Drainage Description  Serosanguineous;Odor    Treatment  Cleansed;Debridement (Selective)    Selective Debridement - Location  Slough from tunnel inside creavis,epiboled edges and callous surrounding wound    Selective Debridement - Tools Used  Forceps;Scissors    Selective Debridement - Tissue Removed  callous, slough devitalized tissue    Wound Therapy - Clinical Statement  wound continues to drain moderately with noted maceration perimeter of wound.  Measured following debridement with overall increase in width due to removal of epibole edges, undermining beneath.  Crevis is decreasing in depth, however still slowly improving.  instructed pt to try to keep packing in wound, only change or reinforce outside of dressing.  Pt verbalized understanding.      Wound Therapy - Functional Problem List  difficulty walking; donning boot     Factors Delaying/Impairing Wound Healing  Altered sensation;Diabetes Mellitus;Multiple medical problems    Hydrotherapy Plan  Debridement;Dressing change;Patient/family education    Wound Therapy - Frequency  2X / week    Wound Therapy - Current Recommendations  PT    Wound  Therapy - Follow Up Recommendations  --   x-ray   Wound Plan  Continue treatment as needed.  Measure weekly.    Dressing   silverhydrofiber, 4x4, ABD pad Kerlix and netting #5                PT Short Term Goals - 07/29/18 1120      PT SHORT TERM GOAL #1   Title  PT to verbalize the signs and sx of cellulitis and to contact MD ASAP     Time  1    Period  Weeks    Status  Achieved      PT SHORT TERM GOAL #2   Title  PT callous to be removed from wound to allow decrease pressure to allow wound to heal     Time  3    Period  Weeks    Status  On-going      PT SHORT TERM GOAL #3   Title  wound drainage to decrease to scant to min to allow pt to be able to don socks without soiling     Time  3    Period  Weeks     Status  On-going      PT SHORT TERM GOAL #4   Title  epiboled edges to be gone to allow wound to begin healing     Time  3    Period  Weeks    Status  On-going        PT Long Term Goals - 07/29/18 1120      PT LONG TERM GOAL #1   Title  PT wound to have no depth to allow pt to be comfortable with self care     Time  6    Period  Weeks    Status  On-going      PT LONG TERM GOAL #2   Title  PT wound length and width to have decreased at least 1 cm to demonstrate a healing environment for pt to begin self care.     Time  6    Period  Weeks    Status  On-going      PT LONG TERM GOAL #3   Title  PT edema to have decreased to facilitate donning of boot.     Time  6    Period  Weeks    Status  On-going              Patient will benefit from skilled therapeutic intervention in order to improve the following deficits and impairments:     Visit Diagnosis: Localized edema  Open wound of right foot with complication, sequela     Problem List There are no active problems to display for this patient.  [name], PTA/CLT [phone]  [name] 09/16/2018, 3:05 PM  [place], [place], [address] Phone: [phone]   Fax:  [phone]  Name: Suzanne Tran MRN: [id number] Date of Birth: Oct 24, 1955

## 2018-09-18 ENCOUNTER — Ambulatory Visit (HOSPITAL_COMMUNITY): Payer: Self-pay | Admitting: Physical Therapy

## 2018-09-18 DIAGNOSIS — M79671 Pain in right foot: Secondary | ICD-10-CM

## 2018-09-18 DIAGNOSIS — R600 Localized edema: Secondary | ICD-10-CM

## 2018-09-18 DIAGNOSIS — S91301S Unspecified open wound, right foot, sequela: Secondary | ICD-10-CM

## 2018-09-18 NOTE — Therapy (Signed)
[place], [place], [address] Phone: [phone]   Fax:  [phone]  Wound Care Therapy  Patient Details  Name: Suzanne Tran MRN: [id number] Date of Birth: Nov 25, 1955 No data recorded  Encounter Date: 09/18/2018  PT End of Session - 09/18/18 1741    Visit Number  17    Number of Visits  20    Date for PT Re-Evaluation  08/29/18    Authorization Type  no insurance; cert [id number]    PT Start Time  [id number]    PT Stop Time  1430    PT Time Calculation (min)  44 min    Activity Tolerance  Patient tolerated treatment well;No increased pain    Behavior During Therapy  WFL for tasks assessed/performed       Past Medical History:  Diagnosis Date  . Diabetes mellitus without complication ([place])   . Hypertension   . Renal disorder     Past Surgical History:  Procedure Laterality Date  . ANKLE SURGERY    . KNEE SURGERY    . REPLACEMENT TOTAL KNEE      There were no vitals filed for this visit.              Wound Therapy - 09/18/18 1713    Subjective  pt states she was able to keep her bandages on and noticed it started to drain through today.    Patient and Family Stated Goals  wound to heal     Date of Onset  06/20/18    Prior Treatments  self care     Evaluation and Treatment Procedures Explained to Patient/Family  Yes    Evaluation and Treatment Procedures  agreed to    Wound Properties Date First Assessed: 07/18/18 Time First Assessed: 0827 Wound Type: Diabetic ulcer Location: Foot Location Orientation: Right Wound Description (Comments): plantar aspect  Present on Admission: Yes   Dressing Type  Silver hydrofiber;Gauze (Comment)    Dressing Changed  Changed    Dressing Status  Old drainage    Dressing Change Frequency  PRN    Site / Wound Assessment  Red    % Wound base Red or Granulating  95%    % Wound base Yellow/Fibrinous Exudate  5%    % Wound base Black/Eschar  0%    % Wound base  Other/Granulation Tissue (Comment)  --   callous surrounding   Peri-wound Assessment  Erythema (non-blanchable);Maceration    Margins  Epibole (rolled edges)    Drainage Amount  Moderate    Drainage Description  Serosanguineous;Odor    Treatment  Cleansed;Debridement (Selective)    Selective Debridement - Location  Slough from tunnel inside creavis,epiboled edges and callous surrounding wound    Selective Debridement - Tools Used  Forceps;Scissors    Selective Debridement - Tissue Removed  callous, slough devitalized tissue    Wound Therapy - Clinical Statement  Able to debride/shave large amount of callous from perimeter to wound to aide in approximation.  cleansed well and packed with silver hydrofiber.  encourged to again keep packing in place if at all possible.      Wound Therapy - Functional Problem List  difficulty walking; donning boot     Factors Delaying/Impairing Wound Healing  Altered sensation;Diabetes Mellitus;Multiple medical problems    Hydrotherapy Plan  Debridement;Dressing change;Patient/family education    Wound Therapy - Frequency  2X / week    Wound Therapy - Current Recommendations  PT  Wound Therapy - Follow Up Recommendations  --   x-ray   Wound Plan  Continue treatment as needed.  Measure weekly.    Dressing   silverhydrofiber, 4x4, ABD pad Kerlix and netting #5                PT Short Term Goals - 07/29/18 1120      PT SHORT TERM GOAL #1   Title  PT to verbalize the signs and sx of cellulitis and to contact MD ASAP     Time  1    Period  Weeks    Status  Achieved      PT SHORT TERM GOAL #2   Title  PT callous to be removed from wound to allow decrease pressure to allow wound to heal     Time  3    Period  Weeks    Status  On-going      PT SHORT TERM GOAL #3   Title  wound drainage to decrease to scant to min to allow pt to be able to don socks without soiling     Time  3    Period  Weeks    Status  On-going      PT SHORT TERM GOAL #4    Title  epiboled edges to be gone to allow wound to begin healing     Time  3    Period  Weeks    Status  On-going        PT Long Term Goals - 07/29/18 1120      PT LONG TERM GOAL #1   Title  PT wound to have no depth to allow pt to be comfortable with self care     Time  6    Period  Weeks    Status  On-going      PT LONG TERM GOAL #2   Title  PT wound length and width to have decreased at least 1 cm to demonstrate a healing environment for pt to begin self care.     Time  6    Period  Weeks    Status  On-going      PT LONG TERM GOAL #3   Title  PT edema to have decreased to facilitate donning of boot.     Time  6    Period  Weeks    Status  On-going              Patient will benefit from skilled therapeutic intervention in order to improve the following deficits and impairments:     Visit Diagnosis: Localized edema  Open wound of right foot with complication, sequela  Pain in right foot     Problem List There are no active problems to display for this patient.  [name], PTA/CLT [phone]  [name] 09/18/2018, 5:45 PM  [facility] [address] [place], [place], [address] Phone: [phone]   Fax:  [phone]  Name: Suzanne Tran MRN: [id number] Date of Birth: June 11, 1956

## 2018-09-22 ENCOUNTER — Telehealth (HOSPITAL_COMMUNITY): Payer: Self-pay | Admitting: Physical Therapy

## 2018-09-22 ENCOUNTER — Ambulatory Visit (HOSPITAL_COMMUNITY): Payer: Self-pay | Admitting: Physical Therapy

## 2018-09-22 NOTE — Telephone Encounter (Signed)
Pt did not show for appointment.  Called and spoke to patient who reported her schedule showed she had an appointment tomorrow instead of today.  Made patient appointment for tomorrow.  NS #1 Lurena Nida, PTA/CLT [phone]

## 2018-09-23 ENCOUNTER — Ambulatory Visit (HOSPITAL_COMMUNITY): Payer: Self-pay | Admitting: Physical Therapy

## 2018-09-23 DIAGNOSIS — S91301S Unspecified open wound, right foot, sequela: Secondary | ICD-10-CM

## 2018-09-23 DIAGNOSIS — M79671 Pain in right foot: Secondary | ICD-10-CM

## 2018-09-23 DIAGNOSIS — R600 Localized edema: Secondary | ICD-10-CM

## 2018-09-23 NOTE — Therapy (Signed)
[facility] [place], [place], [address] Phone: [phone]   Fax:  [phone]  Wound Care Therapy  Patient Details  Name: Suzanne Tran MRN: [id number] Date of Birth: 08-22-1955 No data recorded  Encounter Date: 09/23/2018  PT End of Session - 09/23/18 1358    Visit Number  18    Number of Visits  20    Date for PT Re-Evaluation  09/25/18    Authorization Type  no insurance; cert [id number]    PT Start Time  [id number]   pt. late for apt   PT Stop Time  1210    PT Time Calculation (min)  28 min    Activity Tolerance  Patient tolerated treatment well;No increased pain    Behavior During Therapy  WFL for tasks assessed/performed       Past Medical History:  Diagnosis Date  . Diabetes mellitus without complication ([place])   . Hypertension   . Renal disorder     Past Surgical History:  Procedure Laterality Date  . ANKLE SURGERY    . KNEE SURGERY    . REPLACEMENT TOTAL KNEE      There were no vitals filed for this visit.   Subjective Assessment - 09/23/18 1338    Subjective  Pt arrived with new bandaid on foot, reports she has to change dressings around every 2 days.  No reports of pain today    Currently in Pain?  No/denies                Wound Therapy - 09/23/18 1339    Subjective  Pt arrived with new bandaid on foot, reports she has to change dressings around every 2 days.  No reports of pain today    Patient and Family Stated Goals  wound to heal     Date of Onset  06/20/18    Prior Treatments  self care     Pain Scale  0-10    Pain Score  0-No pain    Evaluation and Treatment Procedures Explained to Patient/Family  Yes    Evaluation and Treatment Procedures  agreed to    Wound Properties Date First Assessed: 07/18/18 Time First Assessed: 0827 Wound Type: Diabetic ulcer Location: Foot Location Orientation: Right Wound Description (Comments): plantar aspect  Present on Admission: Yes   Dressing Type   Silver hydrofiber;Gauze (Comment)    Dressing Changed  Changed    Dressing Status  Old drainage    Dressing Change Frequency  PRN    Site / Wound Assessment  Red    % Wound base Red or Granulating  95%    % Wound base Yellow/Fibrinous Exudate  0%    % Wound base Other/Granulation Tissue (Comment)  5%   callous perimeter   Peri-wound Assessment  Erythema (non-blanchable);Maceration    Wound Length (cm)  2 cm    Wound Width (cm)  1.5 cm    Wound Depth (cm)  0.6 cm    Wound Volume (cm^3)  1.8 cm^3    Wound Surface Area (cm^2)  3 cm^2    Tunneling (cm)  crevis 1.2 depth    Margins  Epibole (rolled edges)    Drainage Amount  Moderate    Drainage Description  Serosanguineous;Odor    Treatment  Cleansed;Debridement (Selective)    Selective Debridement - Location  Slough from tunnel inside creavis,epiboled edges and callous surrounding wound    Selective Debridement - Tools Used  Forceps;Scalpel    Selective  Debridement - Tissue Removed  callous, slough devitalized tissue    Wound Therapy - Clinical Statement  Callous surrounding wound perimeter macerated, selective debridement for removal of callous.  Pt educated on use of bandaid increase maceration, encouraged pt to being applying gauze vs bandaid to assist with maceration for healing.  Continued wiht silverhydrofiber, ABD pad wiht medipore tape, gauze and netting.  No reoprts of pain through session.  EOS reviewed next apt date and time as pt was very late for today's apt.      Wound Therapy - Functional Problem List  difficulty walking; donning boot     Factors Delaying/Impairing Wound Healing  Altered sensation;Diabetes Mellitus;Multiple medical problems    Hydrotherapy Plan  Debridement;Dressing change;Patient/family education    Wound Therapy - Frequency  2X / week    Wound Therapy - Current Recommendations  PT    Wound Plan  Continue treatment as needed.  Measure weekly.    Dressing   silverhydrofiber, 4x4, ABD pad Kerlix and netting  #5                PT Short Term Goals - 07/29/18 1120      PT SHORT TERM GOAL #1   Title  PT to verbalize the signs and sx of cellulitis and to contact MD ASAP     Time  1    Period  Weeks    Status  Achieved      PT SHORT TERM GOAL #2   Title  PT callous to be removed from wound to allow decrease pressure to allow wound to heal     Time  3    Period  Weeks    Status  On-going      PT SHORT TERM GOAL #3   Title  wound drainage to decrease to scant to min to allow pt to be able to don socks without soiling     Time  3    Period  Weeks    Status  On-going      PT SHORT TERM GOAL #4   Title  epiboled edges to be gone to allow wound to begin healing     Time  3    Period  Weeks    Status  On-going        PT Long Term Goals - 07/29/18 1120      PT LONG TERM GOAL #1   Title  PT wound to have no depth to allow pt to be comfortable with self care     Time  6    Period  Weeks    Status  On-going      PT LONG TERM GOAL #2   Title  PT wound length and width to have decreased at least 1 cm to demonstrate a healing environment for pt to begin self care.     Time  6    Period  Weeks    Status  On-going      PT LONG TERM GOAL #3   Title  PT edema to have decreased to facilitate donning of boot.     Time  6    Period  Weeks    Status  On-going              Patient will benefit from skilled therapeutic intervention in order to improve the following deficits and impairments:     Visit Diagnosis: Localized edema  Open wound of right foot with complication, sequela  Pain in right  foot     Problem List There are no active problems to display for this patient.  [address], LPTA; [place]  Aldona Lento 09/23/2018, 2:05 PM  [facility] [address] [place], [place], [address] Phone: [phone]   Fax:  [phone]  Name: Caslyn Guenthner MRN: [id number] Date of Birth:  06-17-1956

## 2018-09-25 ENCOUNTER — Ambulatory Visit (HOSPITAL_COMMUNITY): Payer: Self-pay | Admitting: Physical Therapy

## 2018-09-25 ENCOUNTER — Encounter (HOSPITAL_COMMUNITY): Payer: Self-pay | Admitting: Physical Therapy

## 2018-09-25 ENCOUNTER — Other Ambulatory Visit: Payer: Self-pay

## 2018-09-25 DIAGNOSIS — R600 Localized edema: Secondary | ICD-10-CM

## 2018-09-25 DIAGNOSIS — S91301S Unspecified open wound, right foot, sequela: Secondary | ICD-10-CM

## 2018-09-25 NOTE — Therapy (Signed)
[facility] [place], [place], [address] Phone: [phone]   Fax:  [phone]  Wound Care Therapy  Patient Details  Name: Suzanne Tran MRN: [id number] Date of Birth: 03-03-1956 No data recorded  Encounter Date: 09/25/2018  PT End of Session - 09/25/18 1516    Visit Number  19    Number of Visits  20    Date for PT Re-Evaluation  09/25/18    Authorization Type  no insurance; cert 6/2-7/03    PT Start Time  5009    PT Stop Time  1425    PT Time Calculation (min)  40 min    Activity Tolerance  Patient tolerated treatment well;No increased pain    Behavior During Therapy  WFL for tasks assessed/performed       Past Medical History:  Diagnosis Date  . Diabetes mellitus without complication ([place])   . Hypertension   . Renal disorder     Past Surgical History:  Procedure Laterality Date  . ANKLE SURGERY    . KNEE SURGERY    . REPLACEMENT TOTAL KNEE      There were no vitals filed for this visit.              Wound Therapy - 09/25/18 1514    Subjective  Pt states that she really has no feeling in her foot.      Patient and Family Stated Goals  wound to heal     Date of Onset  06/20/18    Prior Treatments  self care     Evaluation and Treatment Procedures Explained to Patient/Family  Yes    Evaluation and Treatment Procedures  agreed to    Wound Properties Date First Assessed: 07/18/18 Time First Assessed: 0827 Wound Type: Diabetic ulcer Location: Foot Location Orientation: Right Wound Description (Comments): plantar aspect  Present on Admission: Yes   Dressing Type  Silver hydrofiber;Gauze (Comment)    Dressing Status  Old drainage    Dressing Change Frequency  PRN    Site / Wound Assessment  Red    % Wound base Red or Granulating  95%    % Wound base Yellow/Fibrinous Exudate  0%    % Wound base Other/Granulation Tissue (Comment)  5%   callous perimeter   Peri-wound Assessment  Erythema  (non-blanchable);Maceration    Wound Length (cm)  2.4 cm    Wound Width (cm)  1.5 cm    Wound Depth (cm)  1 cm   at deepest level majority remains at .6    Wound Volume (cm^3)  3.6 cm^3    Wound Surface Area (cm^2)  3.6 cm^2    Margins  Epibole (rolled edges)    Drainage Amount  Moderate    Drainage Description  Serous    Treatment  Cleansed;Debridement (Selective)    Selective Debridement - Location  Slough from tunnel inside creavis,epiboled edges and callous surrounding wound    Selective Debridement - Tools Used  Forceps;Scalpel    Selective Debridement - Tissue Removed  callous, slough devitalized tissue    Wound Therapy - Clinical Statement  PT wound appear to have increased in length but this is just the result of debriding the callous around the wound and eliminating any undermining.  PT depth measured today is from a small cavernous area in the base of the wound.  Due to DM, decreased sensation and pt's limited ability to get to the bottom of her foot for self care pt  will continue to benefit from skilled physical therapy for wound care to maintain a healing environment for the wound.       Wound Therapy - Functional Problem List  difficulty walking; donning boot     Factors Delaying/Impairing Wound Healing  Altered sensation;Diabetes Mellitus;Multiple medical problems    Hydrotherapy Plan  Debridement;Dressing change;Patient/family education    Wound Therapy - Frequency  2X / week   extend for 6 more weeks for a total of 15 weeks.    Wound Therapy - Current Recommendations  PT    Wound Plan  Continue treatment as needed.  Measure weekly.    Dressing   silverhydrofiber, 4x4, ABD pad Kerlix and netting #5                PT Short Term Goals - 09/25/18 1522      PT SHORT TERM GOAL #1   Title  PT to verbalize the signs and sx of cellulitis and to contact MD ASAP     Time  1    Period  Weeks    Status  Achieved      PT SHORT TERM GOAL #2   Title  PT callous to be  removed from wound to allow decrease pressure to allow wound to heal     Time  3    Period  Weeks    Status  Partially Met      PT SHORT TERM GOAL #3   Title  wound drainage to decrease to scant to min to allow pt to be able to don socks without soiling     Time  3    Period  Weeks    Status  On-going      PT SHORT TERM GOAL #4   Title  epiboled edges to be gone to allow wound to begin healing     Time  3    Period  Weeks    Status  Partially Met        PT Long Term Goals - 09/25/18 1522      PT LONG TERM GOAL #1   Title  PT wound to have no depth to allow pt to be comfortable with self care     Time  6    Period  Weeks    Status  Not Met      PT LONG TERM GOAL #2   Title  PT wound length and width to have decreased at least 1 cm to demonstrate a healing environment for pt to begin self care.     Time  6    Period  Weeks    Status  Not Met      PT LONG TERM GOAL #3   Title  PT edema to have decreased to facilitate donning of boot.     Time  6    Period  Weeks    Status  Achieved            Plan - 09/25/18 1517    Clinical Impression Statement  as above     Rehab Potential  Fair    PT Frequency  2x / week    PT Duration  6 weeks    PT Treatment/Interventions  ADLs/Self Care Home Management;Patient/family education;Other (comment)   debridement/dressing change   PT Next Visit Plan  see above       Patient will benefit from skilled therapeutic intervention in order to improve the following deficits and impairments:  Pain, Decreased skin  integrity, Difficulty walking, Increased edema  Visit Diagnosis: Localized edema  Open wound of right foot with complication, sequela     Problem List There are no active problems to display for this patient.   Rayetta Humphrey, PT CLT [phone] 09/25/2018, 3:23 PM  [facility] [address] [place], [place], [address] Phone: [phone]   Fax:  [phone]  Name:  Suzanne Tran MRN: [id number] Date of Birth: 30-Nov-1955

## 2018-09-29 ENCOUNTER — Other Ambulatory Visit: Payer: Self-pay

## 2018-09-29 ENCOUNTER — Ambulatory Visit (HOSPITAL_COMMUNITY): Payer: Self-pay | Admitting: Physical Therapy

## 2018-09-29 DIAGNOSIS — S91301S Unspecified open wound, right foot, sequela: Secondary | ICD-10-CM

## 2018-09-29 DIAGNOSIS — M79671 Pain in right foot: Secondary | ICD-10-CM

## 2018-09-29 DIAGNOSIS — R600 Localized edema: Secondary | ICD-10-CM

## 2018-09-29 NOTE — Therapy (Signed)
[facility] [place], [place], [address] Phone: [phone]   Fax:  [phone]  Wound Care Therapy Progress Note  Reporting Period 08/26/2018  to 09/29/2018  See note below for Objective Data and Assessment of Progress/Goals.       Patient Details  Name: Suzanne Tran MRN: [id number] Date of Birth: [birth year] No data recorded  Encounter Date: 09/29/2018  PT End of Session - 09/29/18 1439    Visit Number  20    Number of Visits  30    Date for PT Re-Evaluation  10/23/18    Authorization Type  no insurance; cert [id number]    PT Start Time  1300    PT Stop Time  1335    PT Time Calculation (min)  35 min    Activity Tolerance  Patient tolerated treatment well;No increased pain    Behavior During Therapy  WFL for tasks assessed/performed       Past Medical History:  Diagnosis Date  . Diabetes mellitus without complication ([place])   . Hypertension   . Renal disorder     Past Surgical History:  Procedure Laterality Date  . ANKLE SURGERY    . KNEE SURGERY    . REPLACEMENT TOTAL KNEE      There were no vitals filed for this visit.              Wound Therapy - 09/29/18 1427    Subjective  pt states it was a little sore after last visit and she had to change the dressing by end of the day because it drained through.     Patient and Family Stated Goals  wound to heal     Date of Onset  06/20/18    Prior Treatments  self care     Evaluation and Treatment Procedures Explained to Patient/Family  Yes    Evaluation and Treatment Procedures  agreed to    Wound Properties Date First Assessed: 07/18/18 Time First Assessed: 0827 Wound Type: Diabetic ulcer Location: Foot Location Orientation: Right Wound Description (Comments): plantar aspect  Present on Admission: Yes   Dressing Type  Silver hydrofiber;Gauze (Comment)    Dressing Changed  Changed    Dressing Status  Old drainage    Dressing Change Frequency  PRN     Site / Wound Assessment  Red    % Wound base Red or Granulating  95%    % Wound base Yellow/Fibrinous Exudate  0%    % Wound base Other/Granulation Tissue (Comment)  5%   callous perimeter   Peri-wound Assessment  Erythema (non-blanchable);Maceration    Wound Length (cm)  2.2 cm    Wound Width (cm)  1.5 cm    Wound Depth (cm)  0.5 cm    Wound Volume (cm^3)  1.65 cm^3    Wound Surface Area (cm^2)  3.3 cm^2    Margins  Unattached edges (unapproximated)   50%   Closure  None    Drainage Amount  Moderate    Drainage Description  Serous    Treatment  Cleansed;Debridement (Selective)    Selective Debridement - Location  Slough from tunnel inside creavis,epiboled edges and callous surrounding wound    Selective Debridement - Tools Used  Forceps;Scalpel    Selective Debridement - Tissue Removed  callous, slough devitalized tissue    Wound Therapy - Clinical Statement  Reducing size and depth of wound.  Pt remains compliant with reinforcing or changing dressing as needed and with  use of cam boot with pressure relief.  Continued with silver hydrofiber dressing and coban.      Wound Therapy - Functional Problem List  difficulty walking; donning boot     Factors Delaying/Impairing Wound Healing  Altered sensation;Diabetes Mellitus;Multiple medical problems    Hydrotherapy Plan  Debridement;Dressing change;Patient/family education    Wound Therapy - Frequency  2X / week   extend for 6 more weeks for a total of 15 weeks.    Wound Therapy - Current Recommendations  PT    Wound Plan  Continue treatment as needed.  Measure weekly.    Dressing   silverhydrofiber, 4x4, ABD pad Kerlix coban and netting #5                PT Short Term Goals - 09/29/18 1443      PT SHORT TERM GOAL #1   Title  PT to verbalize the signs and sx of cellulitis and to contact MD ASAP     Time  1    Period  Weeks    Status  Achieved      PT SHORT TERM GOAL #2   Title  PT callous to be removed from wound to  allow decrease pressure to allow wound to heal     Time  3    Period  Weeks    Status  Achieved      PT SHORT TERM GOAL #3   Title  wound drainage to decrease to scant to min to allow pt to be able to don socks without soiling     Time  3    Period  Weeks    Status  On-going      PT SHORT TERM GOAL #4   Title  epiboled edges to be gone to allow wound to begin healing     Time  3    Period  Weeks    Status  Partially Met        PT Long Term Goals - 09/25/18 1522      PT LONG TERM GOAL #1   Title  PT wound to have no depth to allow pt to be comfortable with self care     Time  6    Period  Weeks    Status  Not Met      PT LONG TERM GOAL #2   Title  PT wound length and width to have decreased at least 1 cm to demonstrate a healing environment for pt to begin self care.     Time  6    Period  Weeks    Status  Not Met      PT LONG TERM GOAL #3   Title  PT edema to have decreased to facilitate donning of boot.     Time  6    Period  Weeks    Status  Achieved              Patient will benefit from skilled therapeutic intervention in order to improve the following deficits and impairments:     Visit Diagnosis: Localized edema  Open wound of right foot with complication, sequela  Pain in right foot     Problem List There are no active problems to display for this patient.  [name], PTA/CLT [phone]  [name] 09/29/2018, 2:44 PM  [facility] [address] [place], [place], [address] Phone: [phone]   Fax:  [phone]  Name: Suzanne Tran MRN: [id number] Date of Birth: 1955/08/17

## 2018-10-02 ENCOUNTER — Ambulatory Visit (HOSPITAL_COMMUNITY): Payer: Self-pay | Admitting: Physical Therapy

## 2018-10-02 ENCOUNTER — Other Ambulatory Visit: Payer: Self-pay

## 2018-10-02 DIAGNOSIS — R600 Localized edema: Secondary | ICD-10-CM

## 2018-10-02 DIAGNOSIS — M79671 Pain in right foot: Secondary | ICD-10-CM

## 2018-10-02 DIAGNOSIS — S91301S Unspecified open wound, right foot, sequela: Secondary | ICD-10-CM

## 2018-10-02 NOTE — Therapy (Signed)
[facility] [place], [place], [address] Phone: [phone]   Fax:  [phone]  Wound Care Therapy  Patient Details  Name: Suzanne Tran MRN: [id number] Date of Birth: 07-30-1955 No data recorded  Encounter Date: 10/02/2018  PT End of Session - 10/02/18 1303    Visit Number  21    Number of Visits  30    Date for PT Re-Evaluation  10/23/18    Authorization Type  no insurance; cert 8/03-2/12    PT Start Time  2482    PT Stop Time  1345    PT Time Calculation (min)  40 min    Activity Tolerance  Patient tolerated treatment well;No increased pain    Behavior During Therapy  WFL for tasks assessed/performed       Past Medical History:  Diagnosis Date  . Diabetes mellitus without complication ([place])   . Hypertension   . Renal disorder     Past Surgical History:  Procedure Laterality Date  . ANKLE SURGERY    . KNEE SURGERY    . REPLACEMENT TOTAL KNEE      There were no vitals filed for this visit.         Wound Therapy - 10/02/18 1301    Subjective  Pt has no complaints  ; she has no pain.    Patient and Family Stated Goals  wound to heal     Date of Onset  06/20/18    Prior Treatments  self care     Evaluation and Treatment Procedures Explained to Patient/Family  Yes    Evaluation and Treatment Procedures  agreed to    Wound Properties Date First Assessed: 07/18/18 Time First Assessed: 0827 Wound Type: Diabetic ulcer Location: Foot Location Orientation: Right Wound Description (Comments): plantar aspect  Present on Admission: Yes   Dressing Type  Silver hydrofiber;Gauze (Comment)    Dressing Status  Old drainage    Dressing Change Frequency  PRN    Site / Wound Assessment  Red    % Wound base Red or Granulating  95%    % Wound base Yellow/Fibrinous Exudate  0%    % Wound base Other/Granulation Tissue (Comment)  5%   callous perimeter   Peri-wound Assessment  Erythema (non-blanchable);Maceration    Margins epiboled    Closure  None    Drainage Amount  Moderate    Drainage Description  Serous    Treatment  Cleansed;Debridement (Selective)    Selective Debridement - Location  Slough from tunnel inside creavis,epiboled edges and callous surrounding wound    Selective Debridement - Tools Used  Forceps;Scalpel    Selective Debridement - Tissue Removed  callous, slough devitalized tissue and epiboled edges     Wound Therapy - Clinical Statement  Wound continues to slowly fill in.  Drainage remains moderate.  Pt is unable to care for the wound herself due to location therefore pt will continue to benefit from skilled therapy to prevent infection.     Wound Therapy - Functional Problem List  difficulty walking; donning boot     Factors Delaying/Impairing Wound Healing  Altered sensation;Diabetes Mellitus;Multiple medical problems    Hydrotherapy Plan  Debridement;Dressing change;Patient/family education    Wound Therapy - Frequency  2X / week   extend for 6 more weeks for a total of 15 weeks.    Wound Therapy - Current Recommendations  PT    Wound Plan  Continue treatment as needed.  Measure weekly.  Dressing   silverhydrofiber, calcium alginate, 4x4 and Kerlix f/b netting #5                PT Short Term Goals - 09/29/18 1443      PT SHORT TERM GOAL #1   Title  PT to verbalize the signs and sx of cellulitis and to contact MD ASAP     Time  1    Period  Weeks    Status  Achieved      PT SHORT TERM GOAL #2   Title  PT callous to be removed from wound to allow decrease pressure to allow wound to heal     Time  3    Period  Weeks    Status  Achieved      PT SHORT TERM GOAL #3   Title  wound drainage to decrease to scant to min to allow pt to be able to don socks without soiling     Time  3    Period  Weeks    Status  On-going      PT SHORT TERM GOAL #4   Title  epiboled edges to be gone to allow wound to begin healing     Time  3    Period  Weeks    Status  Partially  Met        PT Long Term Goals - 09/25/18 1522      PT LONG TERM GOAL #1   Title  PT wound to have no depth to allow pt to be comfortable with self care     Time  6    Period  Weeks    Status  Not Met      PT LONG TERM GOAL #2   Title  PT wound length and width to have decreased at least 1 cm to demonstrate a healing environment for pt to begin self care.     Time  6    Period  Weeks    Status  Not Met      PT LONG TERM GOAL #3   Title  PT edema to have decreased to facilitate donning of boot.     Time  6    Period  Weeks    Status  Achieved            Plan - 10/02/18 1303    Rehab Potential  Fair    PT Frequency  2x / week    PT Duration  6 weeks    PT Treatment/Interventions  ADLs/Self Care Home Management;Patient/family education;Other (comment)   debridement/dressing change   PT Next Visit Plan  see above       Patient will benefit from skilled therapeutic intervention in order to improve the following deficits and impairments:  Pain, Decreased skin integrity, Difficulty walking, Increased edema  Visit Diagnosis: Localized edema  Open wound of right foot with complication, sequela  Pain in right foot     Problem List There are no active problems to display for this patient.   Rayetta Humphrey, PT CLT [phone] 10/02/2018, 1:39 PM  [facility] [address] [place], [place], [address] Phone: [phone]   Fax:  [phone]  Name: Suzanne Tran MRN: [id number] Date of Birth: 07-08-1956

## 2018-10-06 ENCOUNTER — Ambulatory Visit (HOSPITAL_COMMUNITY): Payer: Self-pay

## 2018-10-06 ENCOUNTER — Encounter (HOSPITAL_COMMUNITY): Payer: Self-pay

## 2018-10-06 ENCOUNTER — Other Ambulatory Visit: Payer: Self-pay

## 2018-10-06 DIAGNOSIS — R600 Localized edema: Secondary | ICD-10-CM

## 2018-10-06 DIAGNOSIS — M79671 Pain in right foot: Secondary | ICD-10-CM

## 2018-10-06 DIAGNOSIS — S91301S Unspecified open wound, right foot, sequela: Secondary | ICD-10-CM

## 2018-10-06 NOTE — Therapy (Signed)
[place], [place], [address] Phone: [phone]   Fax:  [phone]  Wound Care Therapy  Patient Details  Name: Suzanne Tran MRN: [id number] Date of Birth: Apr 15, 1956 No data recorded  Encounter Date: 10/06/2018  PT End of Session - 10/06/18 1213    Visit Number  22    Number of Visits  30    Date for PT Re-Evaluation  10/23/18    Authorization Type  no insurance; cert [id number]    PT Start Time  0901    PT Stop Time  0946    PT Time Calculation (min)  45 min    Activity Tolerance  Patient tolerated treatment well;No increased pain    Behavior During Therapy  WFL for tasks assessed/performed       Past Medical History:  Diagnosis Date  . Diabetes mellitus without complication ([place])   . Hypertension   . Renal disorder     Past Surgical History:  Procedure Laterality Date  . ANKLE SURGERY    . KNEE SURGERY    . REPLACEMENT TOTAL KNEE      There were no vitals filed for this visit.     Wound Therapy - 10/06/18 1201    Subjective  Patient reports she changed her dressing twice as it was draining. She denies pain.    Patient and Family Stated Goals  wound to heal     Date of Onset  06/20/18    Prior Treatments  self care     Evaluation and Treatment Procedures Explained to Patient/Family  Yes    Evaluation and Treatment Procedures  agreed to    Wound Properties Date First Assessed: 07/18/18 Time First Assessed: 0827 Wound Type: Diabetic ulcer Location: Foot Location Orientation: Right Wound Description (Comments): plantar aspect  Present on Admission: Yes   Dressing Type  Silver hydrofiber;Gauze (Comment)    Dressing Changed  Changed    Dressing Status  Old drainage    Dressing Change Frequency  PRN    Site / Wound Assessment  Red    % Wound base Red or Granulating  95%    % Wound base Yellow/Fibrinous Exudate  0%    % Wound base Other/Granulation Tissue (Comment)  5%   callous perimeter   Peri-wound Assessment  Erythema (non-blanchable);Maceration    Wound Length (cm)  1.9 cm   2.2   Wound Width (cm)  1.4 cm   1.5   Wound Depth (cm)  0.6 cm   0.4   Wound Volume (cm^3)  1.6 cm^3    Wound Surface Area (cm^2)  2.66 cm^2    Margins  Epibole (rolled edges)   50%   Closure  None    Drainage Amount  Moderate    Drainage Description  Serous    Treatment  Cleansed;Debridement (Selective)    Selective Debridement - Location  slough from wound bed, epiboled edges, callous surrounding wound    Selective Debridement - Tools Used  Forceps;Scalpel    Selective Debridement - Tissue Removed  callous, slough devitalized tissue    Wound Therapy - Clinical Statement  Wound continues to present with red tissue in wound bed and pale calloused tissue and epiboled edges at margin of wound. Wound is slowly filling and continues with moderate drainage. Continue with silver hydrofiber in wound bed and alginate to absorb drainage followed by 2x2 and ABD. She will continue to benefit form skilled wound care to improve healing.  Wound Therapy - Functional Problem List  difficulty walking; donning boot     Factors Delaying/Impairing Wound Healing  Altered sensation;Diabetes Mellitus;Multiple medical problems    Hydrotherapy Plan  Debridement;Dressing change;Patient/family education    Wound Therapy - Frequency  2X / week   extend for 6 more weeks for a total of 15 weeks.    Wound Therapy - Current Recommendations  PT    Wound Plan  Measure on Monday. Continue topical agents as appropriate to maintain healing environment.    Dressing   silverhydrofiber, calcium alginate,4x4,  Kerlix and netting #5        PT Short Term Goals - 09/29/18 1443      PT SHORT TERM GOAL #1   Title  PT to verbalize the signs and sx of cellulitis and to contact MD ASAP     Time  1    Period  Weeks    Status  Achieved      PT SHORT TERM GOAL #2   Title  PT callous to be removed from wound to allow decrease pressure  to allow wound to heal     Time  3    Period  Weeks    Status  Achieved      PT SHORT TERM GOAL #3   Title  wound drainage to decrease to scant to min to allow pt to be able to don socks without soiling     Time  3    Period  Weeks    Status  On-going      PT SHORT TERM GOAL #4   Title  epiboled edges to be gone to allow wound to begin healing     Time  3    Period  Weeks    Status  Partially Met        PT Long Term Goals - 09/25/18 1522      PT LONG TERM GOAL #1   Title  PT wound to have no depth to allow pt to be comfortable with self care     Time  6    Period  Weeks    Status  Not Met      PT LONG TERM GOAL #2   Title  PT wound length and width to have decreased at least 1 cm to demonstrate a healing environment for pt to begin self care.     Time  6    Period  Weeks    Status  Not Met      PT LONG TERM GOAL #3   Title  PT edema to have decreased to facilitate donning of boot.     Time  6    Period  Weeks    Status  Achieved         Plan - 10/06/18 1214    Clinical Impression Statement  see above    Rehab Potential  Fair    PT Frequency  2x / week    PT Duration  6 weeks    PT Treatment/Interventions  ADLs/Self Care Home Management;Patient/family education;Other (comment)   debridement/dressing change   PT Next Visit Plan  see above    Consulted and Agree with Plan of Care  Patient       Patient will benefit from skilled therapeutic intervention in order to improve the following deficits and impairments:  Pain, Decreased skin integrity, Difficulty walking, Increased edema  Visit Diagnosis: Localized edema  Open wound of right foot with complication, sequela  Pain  in right foot     Problem List There are no active problems to display for this patient.   Suzanne Tran, PT, DPT, [facility] Physical Therapist with [facility]  10/06/2018 12:14 PM    [place], [place], [address] Phone: [phone]   Fax:  [phone]  Name: Suzanne Tran MRN: [id number] Date of Birth: [birth year]

## 2018-10-08 ENCOUNTER — Ambulatory Visit (HOSPITAL_COMMUNITY): Payer: Self-pay

## 2018-10-08 ENCOUNTER — Other Ambulatory Visit: Payer: Self-pay

## 2018-10-08 ENCOUNTER — Encounter (HOSPITAL_COMMUNITY): Payer: Self-pay

## 2018-10-08 DIAGNOSIS — S91301S Unspecified open wound, right foot, sequela: Secondary | ICD-10-CM

## 2018-10-08 DIAGNOSIS — R600 Localized edema: Secondary | ICD-10-CM

## 2018-10-08 DIAGNOSIS — M79671 Pain in right foot: Secondary | ICD-10-CM

## 2018-10-08 NOTE — Therapy (Signed)
[place], [place], [address] Phone: [phone]   Fax:  [phone]  Wound Care Therapy  Patient Details  Name: Suzanne Tran MRN: [id number] Date of Birth: Aug 05, 1955 No data recorded  Encounter Date: 10/08/2018  PT End of Session - 10/08/18 1139    Visit Number  23    Number of Visits  30    Date for PT Re-Evaluation  10/23/18    Authorization Type  no insurance; cert 0/78-6/75    PT Start Time  0904    PT Stop Time  0944    PT Time Calculation (min)  40 min    Activity Tolerance  Patient tolerated treatment well;No increased pain    Behavior During Therapy  WFL for tasks assessed/performed       Past Medical History:  Diagnosis Date  . Diabetes mellitus without complication ([place])   . Hypertension   . Renal disorder     Past Surgical History:  Procedure Laterality Date  . ANKLE SURGERY    . KNEE SURGERY    . REPLACEMENT TOTAL KNEE      There were no vitals filed for this visit.    Wound Therapy - 10/08/18 1136    Subjective  Patient reports she changed her dressing twice as it was draining. She denies pain.    Patient and Family Stated Goals  wound to heal     Date of Onset  06/20/18    Prior Treatments  self care     Pain Scale  0-10    Pain Score  0-No pain    Evaluation and Treatment Procedures Explained to Patient/Family  Yes    Evaluation and Treatment Procedures  agreed to    Wound Properties Date First Assessed: 07/18/18 Time First Assessed: 0827 Wound Type: Diabetic ulcer Location: Foot Location Orientation: Right Wound Description (Comments): plantar aspect  Present on Admission: Yes   Dressing Type  Silver hydrofiber;Gauze (Comment)    Dressing Changed  Changed    Dressing Status  Old drainage    Dressing Change Frequency  PRN    Site / Wound Assessment  Red    % Wound base Red or Granulating  95%    % Wound base Yellow/Fibrinous Exudate  0%    % Wound base Other/Granulation  Tissue (Comment)  5%   callous perimeter   Peri-wound Assessment  Erythema (non-blanchable);Maceration    Margins  Epibole (rolled edges)   50%   Closure  None    Drainage Amount  Moderate    Drainage Description  Serous    Treatment  Cleansed;Debridement (Selective)    Selective Debridement - Location  slough from wound bed, epiboled edges, callous surrounding wound    Selective Debridement - Tools Used  Forceps;Scalpel    Selective Debridement - Tissue Removed  callous, slough devitalized tissue    Wound Therapy - Clinical Statement  Wound presents with red tissue in wound bed and pale calloused margins with callous build up around periwound. Wound continues to slowly fill and moderate drainage present. Debridement performed to margins to address epiboled edges and remove slough and biofilm. Silver hydrofiber continued to fill in depth of wound bed and alginate, 2x2, and ABD followed to absorb drainage. She will continue to benefit from skilled wound care to improve healing.    Wound Therapy - Functional Problem List  difficulty walking; donning boot     Factors Delaying/Impairing Wound Healing  Altered sensation;Diabetes Mellitus;Multiple medical problems  Hydrotherapy Plan  Debridement;Dressing change;Patient/family education    Wound Therapy - Frequency  2X / week   extend for 6 more weeks for a total of 15 weeks.    Wound Therapy - Current Recommendations  PT    Wound Plan  Measure on Monday. Continue topical agents as appropriate to maintain healing environment.    Dressing   silverhydrofiber, calcium alginate,4x4,  Kerlix and netting #5         PT Short Term Goals - 09/29/18 1443      PT SHORT TERM GOAL #1   Title  PT to verbalize the signs and sx of cellulitis and to contact MD ASAP     Time  1    Period  Weeks    Status  Achieved      PT SHORT TERM GOAL #2   Title  PT callous to be removed from wound to allow decrease pressure to allow wound to heal     Time  3     Period  Weeks    Status  Achieved      PT SHORT TERM GOAL #3   Title  wound drainage to decrease to scant to min to allow pt to be able to don socks without soiling     Time  3    Period  Weeks    Status  On-going      PT SHORT TERM GOAL #4   Title  epiboled edges to be gone to allow wound to begin healing     Time  3    Period  Weeks    Status  Partially Met        PT Long Term Goals - 09/25/18 1522      PT LONG TERM GOAL #1   Title  PT wound to have no depth to allow pt to be comfortable with self care     Time  6    Period  Weeks    Status  Not Met      PT LONG TERM GOAL #2   Title  PT wound length and width to have decreased at least 1 cm to demonstrate a healing environment for pt to begin self care.     Time  6    Period  Weeks    Status  Not Met      PT LONG TERM GOAL #3   Title  PT edema to have decreased to facilitate donning of boot.     Time  6    Period  Weeks    Status  Achieved        Plan - 10/08/18 1140    Clinical Impression Statement  see above    Rehab Potential  Fair    PT Frequency  2x / week    PT Duration  6 weeks    PT Treatment/Interventions  ADLs/Self Care Home Management;Patient/family education;Other (comment)   debridement/dressing change   PT Next Visit Plan  see above    Consulted and Agree with Plan of Care  Patient       Patient will benefit from skilled therapeutic intervention in order to improve the following deficits and impairments:  Pain, Decreased skin integrity, Difficulty walking, Increased edema  Visit Diagnosis: Localized edema  Open wound of right foot with complication, sequela  Pain in right foot     Problem List There are no active problems to display for this patient.   Kipp Brood, PT, DPT, [facility] Physical Therapist  with [facility]  10/08/2018 11:40 AM    [place], [place], [address] Phone:  [phone]   Fax:  [phone]  Name: Suzanne Tran MRN: [id number] Date of Birth: [birth year]

## 2018-10-09 ENCOUNTER — Ambulatory Visit (HOSPITAL_COMMUNITY): Payer: Self-pay | Admitting: Physical Therapy

## 2018-10-10 ENCOUNTER — Other Ambulatory Visit: Payer: Self-pay

## 2018-10-10 ENCOUNTER — Encounter (HOSPITAL_COMMUNITY): Payer: Self-pay

## 2018-10-10 ENCOUNTER — Ambulatory Visit (HOSPITAL_COMMUNITY): Payer: Self-pay

## 2018-10-10 DIAGNOSIS — S91301S Unspecified open wound, right foot, sequela: Secondary | ICD-10-CM

## 2018-10-10 DIAGNOSIS — R600 Localized edema: Secondary | ICD-10-CM

## 2018-10-10 DIAGNOSIS — M79671 Pain in right foot: Secondary | ICD-10-CM

## 2018-10-10 NOTE — Therapy (Signed)
[place], [place], [address] Phone: [phone]   Fax:  [phone]  Wound Care Therapy  Patient Details  Name: Suzanne Tran MRN: [id number] Date of Birth: 1956-04-10 No data recorded  Encounter Date: 10/10/2018  PT End of Session - 10/10/18 0959    Visit Number  24    Number of Visits  30    Date for PT Re-Evaluation  10/23/18    Authorization Type  no insurance; cert [id number]    PT Start Time  0904    PT Stop Time  0944    PT Time Calculation (min)  40 min    Activity Tolerance  Patient tolerated treatment well;No increased pain    Behavior During Therapy  WFL for tasks assessed/performed       Past Medical History:  Diagnosis Date  . Diabetes mellitus without complication ([place])   . Hypertension   . Renal disorder     Past Surgical History:  Procedure Laterality Date  . ANKLE SURGERY    . KNEE SURGERY    . REPLACEMENT TOTAL KNEE      There were no vitals filed for this visit.    Wound Therapy - 10/10/18 0953    Subjective  Patient arrives with dressing intact. She reports it drained some through the bandage so she placed a gauze pad between the wrapping and netting. She denies pain in her foot. She reports she did not sleep well and had nightmares last night.    Patient and Family Stated Goals  wound to heal     Date of Onset  06/20/18    Prior Treatments  self care     Pain Scale  0-10    Pain Score  0-No pain    Evaluation and Treatment Procedures Explained to Patient/Family  Yes    Evaluation and Treatment Procedures  agreed to    Wound Properties Date First Assessed: 07/18/18 Time First Assessed: 0827 Wound Type: Diabetic ulcer Location: Foot Location Orientation: Right Wound Description (Comments): plantar aspect  Present on Admission: Yes   Dressing Type  Silver hydrofiber;Gauze (Comment)    Dressing Changed  Changed    Dressing Status  Old drainage    Dressing Change Frequency  PRN     Site / Wound Assessment  Red    % Wound base Red or Granulating  95%    % Wound base Yellow/Fibrinous Exudate  0%    % Wound base Other/Granulation Tissue (Comment)  5%   callous perimeter   Peri-wound Assessment  Erythema (non-blanchable);Maceration    Margins  Epibole (rolled edges)   50%   Closure  None    Drainage Amount  Moderate    Drainage Description  Serous    Treatment  Cleansed;Debridement (Selective)    Selective Debridement - Location  slough from wound bed, epiboled edges, callous surrounding wound    Selective Debridement - Tools Used  Forceps;Scalpel    Selective Debridement - Tissue Removed  callous, slough devitalized tissue    Wound Therapy - Clinical Statement  Patients wound continues to present with red tissue in wound bed and it is gradually filling in. Periwound remains pale and calloused with some maceration to callous along inferior boarder. Continued with debridement of margins to address epiboled edges which is greatest along superior boarder and removes some callous build up. Silver hydrofiber continued to fill in depth of wound bed and alginate, ABD, and 2x2 to absorb drainage. She will  continue to benefit from skilled wound care to improve healing.    Wound Therapy - Functional Problem List  difficulty walking; donning boot     Factors Delaying/Impairing Wound Healing  Altered sensation;Diabetes Mellitus;Multiple medical problems    Hydrotherapy Plan  Debridement;Dressing change;Patient/family education    Wound Therapy - Frequency  2X / week   extend for 6 more weeks for a total of 15 weeks.    Wound Therapy - Current Recommendations  PT    Wound Plan  Measure on Monday. Continue topical agents as appropriate to maintain healing environment.    Dressing   silverhydrofiber, calcium alginate,4x4,  Kerlix and netting #5        PT Short Term Goals - 09/29/18 1443      PT SHORT TERM GOAL #1   Title  PT to verbalize the signs and sx of cellulitis and to  contact MD ASAP     Time  1    Period  Weeks    Status  Achieved      PT SHORT TERM GOAL #2   Title  PT callous to be removed from wound to allow decrease pressure to allow wound to heal     Time  3    Period  Weeks    Status  Achieved      PT SHORT TERM GOAL #3   Title  wound drainage to decrease to scant to min to allow pt to be able to don socks without soiling     Time  3    Period  Weeks    Status  On-going      PT SHORT TERM GOAL #4   Title  epiboled edges to be gone to allow wound to begin healing     Time  3    Period  Weeks    Status  Partially Met        PT Long Term Goals - 09/25/18 1522      PT LONG TERM GOAL #1   Title  PT wound to have no depth to allow pt to be comfortable with self care     Time  6    Period  Weeks    Status  Not Met      PT LONG TERM GOAL #2   Title  PT wound length and width to have decreased at least 1 cm to demonstrate a healing environment for pt to begin self care.     Time  6    Period  Weeks    Status  Not Met      PT LONG TERM GOAL #3   Title  PT edema to have decreased to facilitate donning of boot.     Time  6    Period  Weeks    Status  Achieved        Plan - 10/10/18 0959    Clinical Impression Statement  see above    Rehab Potential  Fair    PT Frequency  2x / week    PT Duration  6 weeks    PT Treatment/Interventions  ADLs/Self Care Home Management;Patient/family education;Other (comment)   debridement/dressing change   PT Next Visit Plan  see above    Consulted and Agree with Plan of Care  Patient       Patient will benefit from skilled therapeutic intervention in order to improve the following deficits and impairments:  Pain, Decreased skin integrity, Difficulty walking, Increased edema  Visit Diagnosis: Localized  edema  Open wound of right foot with complication, sequela  Pain in right foot     Problem List There are no active problems to display for this patient.   Kipp Brood, PT,  DPT, [facility] Physical Therapist with [facility]  10/10/2018 9:59 AM    [place], [place], [address] Phone: [phone]   Fax:  [phone]  Name: Suzanne Tran MRN: [id number] Date of Birth: Nov 17, 1955

## 2018-10-13 ENCOUNTER — Ambulatory Visit (HOSPITAL_COMMUNITY): Payer: Medicaid Other | Admitting: Physical Therapy

## 2018-10-13 ENCOUNTER — Ambulatory Visit (HOSPITAL_COMMUNITY): Payer: Self-pay

## 2018-10-13 ENCOUNTER — Other Ambulatory Visit: Payer: Self-pay

## 2018-10-13 ENCOUNTER — Encounter (HOSPITAL_COMMUNITY): Payer: Self-pay

## 2018-10-13 DIAGNOSIS — R600 Localized edema: Secondary | ICD-10-CM

## 2018-10-13 DIAGNOSIS — S91301S Unspecified open wound, right foot, sequela: Secondary | ICD-10-CM

## 2018-10-13 DIAGNOSIS — M79671 Pain in right foot: Secondary | ICD-10-CM

## 2018-10-13 NOTE — Therapy (Signed)
[place], [place], [address] Phone: [phone]   Fax:  [phone]  Wound Care Therapy  Patient Details  Name: Suzanne Tran MRN: [id number] Date of Birth: [birth year] No data recorded  Encounter Date: 10/13/2018  PT End of Session - 10/13/18 1121    Visit Number  25    Number of Visits  30    Date for PT Re-Evaluation  10/23/18    Authorization Type  no insurance; cert 9/41-7/40    PT Start Time  0906    PT Stop Time  0950    PT Time Calculation (min)  44 min    Activity Tolerance  Patient tolerated treatment well;No increased pain    Behavior During Therapy  WFL for tasks assessed/performed       Past Medical History:  Diagnosis Date  . Diabetes mellitus without complication ([place])   . Hypertension   . Renal disorder     Past Surgical History:  Procedure Laterality Date  . ANKLE SURGERY    . KNEE SURGERY    . REPLACEMENT TOTAL KNEE      There were no vitals filed for this visit.     Wound Therapy - 10/13/18 1116    Subjective  Patient reports she had  good weekend. She states there was some drainage coming through the bandage this morning but she never ad to change her dressing this weekend.     Patient and Family Stated Goals  wound to heal     Date of Onset  06/20/18    Prior Treatments  self care     Pain Scale  0-10    Pain Score  0-No pain    Evaluation and Treatment Procedures Explained to Patient/Family  Yes    Evaluation and Treatment Procedures  agreed to    Wound Properties Date First Assessed: 07/18/18 Time First Assessed: 0827 Wound Type: Diabetic ulcer Location: Foot Location Orientation: Right Wound Description (Comments): plantar aspect  Present on Admission: Yes   Dressing Type  Silver hydrofiber;Gauze (Comment)    Dressing Changed  Changed    Dressing Status  Old drainage    Dressing Change Frequency  PRN    Site / Wound Assessment  Red    % Wound base Red or Granulating   95%    % Wound base Yellow/Fibrinous Exudate  0%    % Wound base Other/Granulation Tissue (Comment)  5%   callous perimeter   Peri-wound Assessment  Erythema (non-blanchable);Maceration    Wound Length (cm)  1.8 cm   1.9   Wound Width (cm)  1 cm   1.4   Wound Depth (cm)  0.6 cm   0.6   Wound Volume (cm^3)  1.08 cm^3    Wound Surface Area (cm^2)  1.8 cm^2    Margins  Epibole (rolled edges)   75%   Closure  None    Drainage Amount  Moderate    Drainage Description  Serous    Treatment  Cleansed;Debridement (Selective)    Selective Debridement - Location  slough from wound bed, epiboled edges, callous surrounding wound    Selective Debridement - Tools Used  Forceps;Scalpel    Selective Debridement - Tissue Removed  callous, slough devitalized tissue    Wound Therapy - Clinical Statement  Patient's wound presents with continued gradual reduction in size. Wound bed is pink/red tissue and ~75% of margins are epiboled edges requiring debridement. Wound continues to drain moderately pale and  calloused with some maceration to callous along inferior boarder. Continued with silver hydrofiber to fill in depth of wound bed and alginate, ABD, and 2x2 to absorb drainage. She will continue to benefit from skilled wound care to improve healing.    Wound Therapy - Functional Problem List  difficulty walking; donning boot     Factors Delaying/Impairing Wound Healing  Altered sensation;Diabetes Mellitus;Multiple medical problems    Hydrotherapy Plan  Debridement;Dressing change;Patient/family education    Wound Therapy - Frequency  2X / week   extend for 6 more weeks for a total of 15 weeks.    Wound Therapy - Current Recommendations  PT    Wound Plan  Measure on Monday. Continue topical agents as appropriate to maintain healing environment.    Dressing   silverhydrofiber, calcium alginate,4x4,  Kerlix and netting #5        PT Short Term Goals - 09/29/18 1443      PT SHORT TERM GOAL #1   Title  PT  to verbalize the signs and sx of cellulitis and to contact MD ASAP     Time  1    Period  Weeks    Status  Achieved      PT SHORT TERM GOAL #2   Title  PT callous to be removed from wound to allow decrease pressure to allow wound to heal     Time  3    Period  Weeks    Status  Achieved      PT SHORT TERM GOAL #3   Title  wound drainage to decrease to scant to min to allow pt to be able to don socks without soiling     Time  3    Period  Weeks    Status  On-going      PT SHORT TERM GOAL #4   Title  epiboled edges to be gone to allow wound to begin healing     Time  3    Period  Weeks    Status  Partially Met        PT Long Term Goals - 09/25/18 1522      PT LONG TERM GOAL #1   Title  PT wound to have no depth to allow pt to be comfortable with self care     Time  6    Period  Weeks    Status  Not Met      PT LONG TERM GOAL #2   Title  PT wound length and width to have decreased at least 1 cm to demonstrate a healing environment for pt to begin self care.     Time  6    Period  Weeks    Status  Not Met      PT LONG TERM GOAL #3   Title  PT edema to have decreased to facilitate donning of boot.     Time  6    Period  Weeks    Status  Achieved        Plan - 10/13/18 1121    Clinical Impression Statement  see above    Rehab Potential  Fair    PT Frequency  2x / week    PT Duration  6 weeks    PT Treatment/Interventions  ADLs/Self Care Home Management;Patient/family education;Other (comment)   debridement/dressing change   PT Next Visit Plan  see above    Consulted and Agree with Plan of Care  Patient  Patient will benefit from skilled therapeutic intervention in order to improve the following deficits and impairments:  Pain, Decreased skin integrity, Difficulty walking, Increased edema  Visit Diagnosis: Localized edema  Open wound of right foot with complication, sequela  Pain in right foot     Problem List There are no active problems to  display for this patient.   Kipp Brood, PT, DPT, [facility] Physical Therapist with [facility]  10/13/2018 [place] [address] [place], [place], [address] Phone: [phone]   Fax:  [phone]  Name: Suzanne Tran MRN: [id number] Date of Birth: 08-12-1955

## 2018-10-15 ENCOUNTER — Other Ambulatory Visit: Payer: Self-pay

## 2018-10-15 ENCOUNTER — Encounter (HOSPITAL_COMMUNITY): Payer: Self-pay

## 2018-10-15 ENCOUNTER — Ambulatory Visit (HOSPITAL_COMMUNITY): Payer: Self-pay | Attending: Internal Medicine

## 2018-10-15 DIAGNOSIS — R600 Localized edema: Secondary | ICD-10-CM | POA: Insufficient documentation

## 2018-10-15 DIAGNOSIS — M79671 Pain in right foot: Secondary | ICD-10-CM | POA: Insufficient documentation

## 2018-10-15 DIAGNOSIS — S91301S Unspecified open wound, right foot, sequela: Secondary | ICD-10-CM | POA: Insufficient documentation

## 2018-10-15 NOTE — Therapy (Signed)
[facility] [address] [place], [place], [address] Phone: [phone]   Fax:  [phone]  Wound Care Therapy  Patient Details  Name: Suzanne Tran MRN: [id number] Date of Birth: 10/24/1955 No data recorded  Encounter Date: 10/15/2018  PT End of Session - 10/15/18 1131    Visit Number  26    Number of Visits  30    Date for PT Re-Evaluation  10/23/18    Authorization Type  no insurance, self pay    Authorization Time Period  07/18/18-08/28/18; second: 08/21/18-09/25/18; third: 09/25/18-11/06/18    PT Start Time  0902    PT Stop Time  0943    PT Time Calculation (min)  41 min    Activity Tolerance  Patient tolerated treatment well;No increased pain    Behavior During Therapy  WFL for tasks assessed/performed       Past Medical History:  Diagnosis Date  . Diabetes mellitus without complication (HCC)   . Hypertension   . Renal disorder     Past Surgical History:  Procedure Laterality Date  . ANKLE SURGERY    . KNEE SURGERY    . REPLACEMENT TOTAL KNEE      There were no vitals filed for this visit.      Wound Therapy - 10/15/18 1123    Subjective  Patient arrives with dressing intact and reports minimal drainage. She denies pain and states she is trying to stay off of her foot as much as possible.     Patient and Family Stated Goals  wound to heal     Date of Onset  06/20/18    Prior Treatments  self care     Pain Scale  0-10    Pain Score  0-No pain    Evaluation and Treatment Procedures Explained to Patient/Family  Yes    Evaluation and Treatment Procedures  agreed to    Wound Properties Date First Assessed: 07/18/18 Time First Assessed: 0827 Wound Type: Diabetic ulcer Location: Foot Location Orientation: Right Wound Description (Comments): plantar aspect  Present on Admission: Yes   Dressing Type  Silver hydrofiber;Gauze (Comment);Alginate    Dressing Changed  Changed    Dressing Status  Old drainage    Dressing Change  Frequency  PRN    Site / Wound Assessment  Red    % Wound base Red or Granulating  95%    % Wound base Yellow/Fibrinous Exudate  0%    % Wound base Other/Granulation Tissue (Comment)  5%   callous perimeter   Peri-wound Assessment  Erythema (non-blanchable);Maceration    Margins  Epibole (rolled edges)   75%   Closure  None    Drainage Amount  Moderate    Drainage Description  Serous    Treatment  Cleansed;Debridement (Selective)    Selective Debridement - Location  slough from wound bed, epiboled edges, callous surrounding wound    Selective Debridement - Tools Used  Forceps;Scalpel    Selective Debridement - Tissue Removed  callous, slough devitalized tissue    Wound Therapy - Clinical Statement  Patient's wound presents with ongoing moderate drainage and dressing have slid around at wound bed indicating patient is likely ambulating more and generating sheer forces to the dressing to skin. Her wound bed remains pink/red and margins continue to present with epibole requiring debridement. Callous debrided around periwound. Continued with silver hydrofiber to fill in depth of wound bed and alginate, ABD, and 2x2 to absorb drainage. She will continue to   benefit from skilled wound care to improve healing.    Wound Therapy - Functional Problem List  difficulty walking; donning boot     Factors Delaying/Impairing Wound Healing  Altered sensation;Diabetes Mellitus;Multiple medical problems    Hydrotherapy Plan  Debridement;Dressing change;Patient/family education    Wound Therapy - Frequency  2X / week   extend for 6 more weeks for a total of 15 weeks.    Wound Therapy - Current Recommendations  PT    Wound Plan  Measure on Monday. Continue topical agents as appropriate to maintain healing environment.    Dressing   silverhydrofiber, calcium alginate,4x4,  Kerlix and netting #5         PT Short Term Goals - 09/29/18 1443      PT SHORT TERM GOAL #1   Title  PT to verbalize the signs and sx  of cellulitis and to contact MD ASAP     Time  1    Period  Weeks    Status  Achieved      PT SHORT TERM GOAL #2   Title  PT callous to be removed from wound to allow decrease pressure to allow wound to heal     Time  3    Period  Weeks    Status  Achieved      PT SHORT TERM GOAL #3   Title  wound drainage to decrease to scant to min to allow pt to be able to don socks without soiling     Time  3    Period  Weeks    Status  On-going      PT SHORT TERM GOAL #4   Title  epiboled edges to be gone to allow wound to begin healing     Time  3    Period  Weeks    Status  Partially Met        PT Long Term Goals - 09/25/18 1522      PT LONG TERM GOAL #1   Title  PT wound to have no depth to allow pt to be comfortable with self care     Time  6    Period  Weeks    Status  Not Met      PT LONG TERM GOAL #2   Title  PT wound length and width to have decreased at least 1 cm to demonstrate a healing environment for pt to begin self care.     Time  6    Period  Weeks    Status  Not Met      PT LONG TERM GOAL #3   Title  PT edema to have decreased to facilitate donning of boot.     Time  6    Period  Weeks    Status  Achieved            Plan - 10/15/18 1133    Clinical Impression Statement  see above    Rehab Potential  Fair    PT Frequency  2x / week    PT Duration  6 weeks    PT Treatment/Interventions  ADLs/Self Care Home Management;Patient/family education;Other (comment)   debridement/dressing change   PT Next Visit Plan  see above    Consulted and Agree with Plan of Care  Patient       Patient will benefit from skilled therapeutic intervention in order to improve the following deficits and impairments:  Pain, Decreased skin integrity, Difficulty walking, Increased edema  Visit Diagnosis: Localized edema  Open wound of right foot with complication, sequela  Pain in right foot     Problem List There are no active problems to display for this  patient.   Rachel Quinn-Brown, PT, DPT, WTA Physical Therapist with [facility]  10/15/2018 11:34 AM    [facility] [address] [place], [place], [address] Phone: [phone]   Fax:  [phone]  Name: Takima Howton MRN: [id number] Date of Birth: 04/09/1956    

## 2018-10-16 ENCOUNTER — Ambulatory Visit (HOSPITAL_COMMUNITY): Payer: Medicaid Other | Admitting: Physical Therapy

## 2018-10-17 ENCOUNTER — Encounter (HOSPITAL_COMMUNITY): Payer: Self-pay

## 2018-10-17 ENCOUNTER — Other Ambulatory Visit: Payer: Self-pay

## 2018-10-17 ENCOUNTER — Ambulatory Visit (HOSPITAL_COMMUNITY): Payer: Self-pay

## 2018-10-17 DIAGNOSIS — R600 Localized edema: Secondary | ICD-10-CM

## 2018-10-17 DIAGNOSIS — M79671 Pain in right foot: Secondary | ICD-10-CM

## 2018-10-17 DIAGNOSIS — S91301S Unspecified open wound, right foot, sequela: Secondary | ICD-10-CM

## 2018-10-17 NOTE — Therapy (Signed)
[place], [place], [address] Phone: [phone]   Fax:  [phone]  Wound Care Therapy  Patient Details  Name: Suzanne Tran MRN: [id number] Date of Birth: 18-May-1956 No data recorded  Encounter Date: 10/17/2018  PT End of Session - 10/17/18 1005    Visit Number  27    Number of Visits  30    Date for PT Re-Evaluation  10/23/18    Authorization Type  no insurance, self pay    Authorization Time Period  07/18/18-08/28/18; second: 08/21/18-09/25/18; third: 09/25/18-11/06/18    PT Start Time  0903    PT Stop Time  0948    PT Time Calculation (min)  45 min    Activity Tolerance  Patient tolerated treatment well;No increased pain    Behavior During Therapy  WFL for tasks assessed/performed       Past Medical History:  Diagnosis Date  . Diabetes mellitus without complication ([place])   . Hypertension   . Renal disorder     Past Surgical History:  Procedure Laterality Date  . ANKLE SURGERY    . KNEE SURGERY    . REPLACEMENT TOTAL KNEE      There were no vitals filed for this visit.    Wound Therapy - 10/17/18 0956    Subjective  Patient arrives with dressing intact.     Patient and Family Stated Goals  wound to heal     Date of Onset  06/20/18    Prior Treatments  self care     Pain Scale  0-10    Pain Score  0-No pain    Evaluation and Treatment Procedures Explained to Patient/Family  Yes    Evaluation and Treatment Procedures  agreed to    Wound Properties Date First Assessed: 07/18/18 Time First Assessed: 0827 Wound Type: Diabetic ulcer Location: Foot Location Orientation: Right Wound Description (Comments): plantar aspect  Present on Admission: Yes   Dressing Type  Silver hydrofiber;Gauze (Comment);Alginate    Dressing Changed  Changed    Dressing Status  Old drainage    Dressing Change Frequency  PRN    Site / Wound Assessment  Red    % Wound base Red or Granulating  95%    % Wound base  Yellow/Fibrinous Exudate  0%    % Wound base Other/Granulation Tissue (Comment)  5%   callous perimeter   Peri-wound Assessment  Erythema (non-blanchable);Maceration    Margins  Epibole (rolled edges)   75%   Closure  None    Drainage Amount  Moderate    Drainage Description  Serous    Treatment  Cleansed;Debridement (Selective)    Selective Debridement - Location  slough from wound bed, epiboled edges, callous surrounding wound    Selective Debridement - Tools Used  Forceps;Scalpel    Selective Debridement - Tissue Removed  callous, slough devitalized tissue    Wound Therapy - Clinical Statement  Patient's wound continues to present with minimal change in size. She has moderate drainage and dressings continue to slide around likely due to patient walking. She was educated on importance of staying off of her feet to keep pressure off of wound. Her wound remains pink/red and ~ 50% of margins are epiboled. She continues to build callous surrounding periowound indicating poor pressure relief. Continued with silver hydrofiber to fill in depth of wound bed and alginate, ABD, and 2x2 to absorb drainage. She will continue to benefit from skilled wound care to improve healing.  Wound Therapy - Functional Problem List  difficulty walking; donning boot     Factors Delaying/Impairing Wound Healing  Altered sensation;Diabetes Mellitus;Multiple medical problems    Hydrotherapy Plan  Debridement;Dressing change;Patient/family education    Wound Therapy - Frequency  2X / week   extend for 6 more weeks for a total of 15 weeks.    Wound Therapy - Current Recommendations  PT    Wound Plan  Measure on Monday. Continue topical agents as appropriate to maintain healing environment.    Dressing   silverhydrofiber, calcium alginate,4x4,  Kerlix and netting #5        PT Short Term Goals - 09/29/18 1443      PT SHORT TERM GOAL #1   Title  PT to verbalize the signs and sx of cellulitis and to contact MD ASAP      Time  1    Period  Weeks    Status  Achieved      PT SHORT TERM GOAL #2   Title  PT callous to be removed from wound to allow decrease pressure to allow wound to heal     Time  3    Period  Weeks    Status  Achieved      PT SHORT TERM GOAL #3   Title  wound drainage to decrease to scant to min to allow pt to be able to don socks without soiling     Time  3    Period  Weeks    Status  On-going      PT SHORT TERM GOAL #4   Title  epiboled edges to be gone to allow wound to begin healing     Time  3    Period  Weeks    Status  Partially Met        PT Long Term Goals - 09/25/18 1522      PT LONG TERM GOAL #1   Title  PT wound to have no depth to allow pt to be comfortable with self care     Time  6    Period  Weeks    Status  Not Met      PT LONG TERM GOAL #2   Title  PT wound length and width to have decreased at least 1 cm to demonstrate a healing environment for pt to begin self care.     Time  6    Period  Weeks    Status  Not Met      PT LONG TERM GOAL #3   Title  PT edema to have decreased to facilitate donning of boot.     Time  6    Period  Weeks    Status  Achieved        Plan - 10/17/18 1005    Clinical Impression Statement  see above    Rehab Potential  Fair    PT Frequency  2x / week    PT Duration  6 weeks    PT Treatment/Interventions  ADLs/Self Care Home Management;Patient/family education;Other (comment)   debridement/dressing change   PT Next Visit Plan  see above    Consulted and Agree with Plan of Care  Patient       Patient will benefit from skilled therapeutic intervention in order to improve the following deficits and impairments:  Pain, Decreased skin integrity, Difficulty walking, Increased edema  Visit Diagnosis: Localized edema  Open wound of right foot with complication, sequela  Pain in  right foot     Problem List There are no active problems to display for this patient.   Kipp Brood, PT, DPT, [facility] Physical  Therapist with [facility]  10/17/2018 10:05 AM    [place], [place], [address] Phone: [phone]   Fax:  [phone]  Name: Suzanne Tran MRN: [id number] Date of Birth: 07/19/1955

## 2018-10-20 ENCOUNTER — Ambulatory Visit (HOSPITAL_COMMUNITY): Payer: Self-pay

## 2018-10-20 ENCOUNTER — Telehealth (HOSPITAL_COMMUNITY): Payer: Self-pay

## 2018-10-20 ENCOUNTER — Other Ambulatory Visit: Payer: Self-pay

## 2018-10-20 ENCOUNTER — Encounter (HOSPITAL_COMMUNITY): Payer: Self-pay

## 2018-10-20 DIAGNOSIS — M79671 Pain in right foot: Secondary | ICD-10-CM

## 2018-10-20 DIAGNOSIS — S91301S Unspecified open wound, right foot, sequela: Secondary | ICD-10-CM

## 2018-10-20 DIAGNOSIS — R600 Localized edema: Secondary | ICD-10-CM

## 2018-10-20 NOTE — Addendum Note (Signed)
Addended by: Anitra Lauth on: 10/20/2018 09:59 AM   Modules accepted: Orders

## 2018-10-20 NOTE — Therapy (Signed)
[place], [place], [address] Phone: [phone]   Fax:  [phone]  Wound Care Therapy  Patient Details  Name: Suzanne Tran MRN: [id number] Date of Birth: 1956/07/16 No data recorded  Encounter Date: 10/20/2018  PT End of Session - 10/20/18 0949    Visit Number  28    Number of Visits  30    Date for PT Re-Evaluation  10/23/18    Authorization Type  no insurance, self pay    Authorization Time Period  07/18/18-08/28/18; second: 08/21/18-09/25/18; third: 09/25/18-11/06/18    PT Start Time  0909    PT Stop Time  0942    PT Time Calculation (min)  33 min    Activity Tolerance  Patient tolerated treatment well;No increased pain    Behavior During Therapy  WFL for tasks assessed/performed       Past Medical History:  Diagnosis Date  . Diabetes mellitus without complication ([place])   . Hypertension   . Renal disorder     Past Surgical History:  Procedure Laterality Date  . ANKLE SURGERY    . KNEE SURGERY    . REPLACEMENT TOTAL KNEE      There were no vitals filed for this visit.    Wound Therapy - 10/20/18 0943    Subjective  Patient arrives with dressing and tape intact but she has replaced the gauze wrap around her foot as she reports it drained through.     Patient and Family Stated Goals  wound to heal     Date of Onset  06/20/18    Prior Treatments  self care     Pain Scale  0-10    Pain Score  0-No pain    Evaluation and Treatment Procedures Explained to Patient/Family  Yes    Evaluation and Treatment Procedures  agreed to    Wound Properties Date First Assessed: 07/18/18 Time First Assessed: 0827 Wound Type: Diabetic ulcer Location: Foot Location Orientation: Right Wound Description (Comments): plantar aspect  Present on Admission: Yes   Dressing Type  Silver hydrofiber;Gauze (Comment)    Dressing Changed  Changed    Dressing Status  Old drainage    Dressing Change Frequency  PRN    Site / Wound  Assessment  Red    % Wound base Red or Granulating  95%    % Wound base Yellow/Fibrinous Exudate  0%    % Wound base Other/Granulation Tissue (Comment)  5%   callous perimeter   Peri-wound Assessment  Erythema (non-blanchable);Maceration    Wound Length (cm)  1.9 cm   1.8   Wound Width (cm)  1.1 cm   1   Wound Depth (cm)  0.5 cm   0.6   Wound Volume (cm^3)  1.04 cm^3    Wound Surface Area (cm^2)  2.09 cm^2    Margins  Epibole (rolled edges)   75%   Closure  None    Drainage Amount  Moderate    Drainage Description  Serous    Treatment  Debridement (Selective);Cleansed    Selective Debridement - Location  slough from wound bed, epiboled edges, callous surrounding wound    Selective Debridement - Tools Used  Forceps;Scalpel    Selective Debridement - Tissue Removed  callous, slough devitalized tissue    Wound Therapy - Clinical Statement  Patients wound presents with slightly decreased depth today but no change in length and width. She continues to have epiboled edges requiring selective debridement to  margins and slough debrided from wound bed. She continues to build callous around periwound indicating limited pressure relief and was educated again on importance of remaining off her foot as much as possible. Continued with silver hydrofiber to fill in depth of wound bed and alginate, ABD, and 2x2 to absorb drainage. She will continue to benefit from skilled wound care to improve healing.    Wound Therapy - Functional Problem List  difficulty walking; donning boot     Factors Delaying/Impairing Wound Healing  Altered sensation;Diabetes Mellitus;Multiple medical problems    Hydrotherapy Plan  Debridement;Dressing change;Patient/family education    Wound Therapy - Frequency  2X / week   extend for 6 more weeks for a total of 15 weeks.    Wound Therapy - Current Recommendations  PT    Wound Plan  Measure on Monday. Continue topical agents as appropriate to maintain healing environment.     Dressing   silverhydrofiber, calcium alginate,4x4,  Kerlix and netting #5       PT Short Term Goals - 09/29/18 1443      PT SHORT TERM GOAL #1   Title  PT to verbalize the signs and sx of cellulitis and to contact MD ASAP     Time  1    Period  Weeks    Status  Achieved      PT SHORT TERM GOAL #2   Title  PT callous to be removed from wound to allow decrease pressure to allow wound to heal     Time  3    Period  Weeks    Status  Achieved      PT SHORT TERM GOAL #3   Title  wound drainage to decrease to scant to min to allow pt to be able to don socks without soiling     Time  3    Period  Weeks    Status  On-going      PT SHORT TERM GOAL #4   Title  epiboled edges to be gone to allow wound to begin healing     Time  3    Period  Weeks    Status  Partially Met        PT Long Term Goals - 09/25/18 1522      PT LONG TERM GOAL #1   Title  PT wound to have no depth to allow pt to be comfortable with self care     Time  6    Period  Weeks    Status  Not Met      PT LONG TERM GOAL #2   Title  PT wound length and width to have decreased at least 1 cm to demonstrate a healing environment for pt to begin self care.     Time  6    Period  Weeks    Status  Not Met      PT LONG TERM GOAL #3   Title  PT edema to have decreased to facilitate donning of boot.     Time  6    Period  Weeks    Status  Achieved        Plan - 10/20/18 8527    Clinical Impression Statement  see above    Rehab Potential  Fair    PT Frequency  2x / week    PT Duration  6 weeks    PT Treatment/Interventions  ADLs/Self Care Home Management;Patient/family education;Other (comment)   debridement/dressing change   PT  Next Visit Plan  see above    Consulted and Agree with Plan of Care  Patient       Patient will benefit from skilled therapeutic intervention in order to improve the following deficits and impairments:  Pain, Decreased skin integrity, Difficulty walking, Increased edema  Visit  Diagnosis: Localized edema  Open wound of right foot with complication, sequela  Pain in right foot     Problem List There are no active problems to display for this patient.   Kipp Brood, PT, DPT, [facility] Physical Therapist with [facility]  10/20/2018 9:53 AM    [place] [address] [place], [place], [address] Phone: [phone]   Fax:  [phone]  Name: Maryna Yeagle MRN: [id number] Date of Birth: [birth year]

## 2018-10-20 NOTE — Telephone Encounter (Signed)
I spoke with [name] regarding her appointment schedule and that she would only need to be seen 2x/week rather than 3 which is how she had been scheduled. She was agreeable to this and will plan to be seen on Friday this week for her next appointment. She cannot come to therapy next Monday but we will keep her Wednesday and Friday appointments for next week.   Valentino Saxon, PT, DPT, [facility] Physical Therapist with [facility]  10/20/2018 2:11 PM

## 2018-10-22 ENCOUNTER — Ambulatory Visit (HOSPITAL_COMMUNITY): Payer: Medicaid Other

## 2018-10-24 ENCOUNTER — Ambulatory Visit (HOSPITAL_COMMUNITY): Payer: Self-pay

## 2018-10-24 ENCOUNTER — Other Ambulatory Visit: Payer: Self-pay

## 2018-10-24 ENCOUNTER — Encounter (HOSPITAL_COMMUNITY): Payer: Self-pay

## 2018-10-24 DIAGNOSIS — R600 Localized edema: Secondary | ICD-10-CM

## 2018-10-24 DIAGNOSIS — M79671 Pain in right foot: Secondary | ICD-10-CM

## 2018-10-24 DIAGNOSIS — S91301S Unspecified open wound, right foot, sequela: Secondary | ICD-10-CM

## 2018-10-24 NOTE — Therapy (Signed)
[place], [place], [address] Phone: [phone]   Fax:  [phone]  Wound Care Therapy  Patient Details  Name: Suzanne Tran MRN: [id number] Date of Birth: 1955/08/31 No data recorded  Encounter Date: 10/24/2018  PT End of Session - 10/24/18 1258    Visit Number  29    Number of Visits  30    Date for PT Re-Evaluation  10/23/18    Authorization Type  no insurance, self pay    Authorization Time Period  07/18/18-08/28/18; second: 08/21/18-09/25/18; third: 09/25/18-11/06/18    PT Start Time  0906    PT Stop Time  0948    PT Time Calculation (min)  42 min    Activity Tolerance  Patient tolerated treatment well;No increased pain    Behavior During Therapy  WFL for tasks assessed/performed       Past Medical History:  Diagnosis Date  . Diabetes mellitus without complication ([place])   . Hypertension   . Renal disorder     Past Surgical History:  Procedure Laterality Date  . ANKLE SURGERY    . KNEE SURGERY    . REPLACEMENT TOTAL KNEE      There were no vitals filed for this visit.     Wound Therapy - 10/24/18 1246    Subjective  Patient reports she removed teh gauze wrap but left the tape and dressing underneath alone.     Patient and Family Stated Goals  wound to heal     Date of Onset  06/20/18    Prior Treatments  self care     Pain Scale  0-10    Pain Score  0-No pain    Evaluation and Treatment Procedures Explained to Patient/Family  Yes    Evaluation and Treatment Procedures  agreed to    Wound Properties Date First Assessed: 07/18/18 Time First Assessed: 0827 Wound Type: Diabetic ulcer Location: Foot Location Orientation: Right Wound Description (Comments): plantar aspect  Present on Admission: Yes   Dressing Type  Silver hydrofiber;Gauze (Comment)    Dressing Changed  Changed    Dressing Status  Old drainage    Dressing Change Frequency  PRN    Site / Wound Assessment  Red    % Wound base Red or  Granulating  95%    % Wound base Yellow/Fibrinous Exudate  0%    % Wound base Other/Granulation Tissue (Comment)  5%   callous perimeter   Peri-wound Assessment  Erythema (non-blanchable);Maceration    Margins  Epibole (rolled edges)   75%   Closure  None    Drainage Amount  Moderate    Drainage Description  Serous    Treatment  Cleansed;Debridement (Selective)    Selective Debridement - Location  slough from wound bed, epiboled edges, callous surrounding wound    Selective Debridement - Tools Used  Forceps;Scalpel    Selective Debridement - Tissue Removed  callous, slough devitalized tissue    Wound Therapy - Clinical Statement  Patient's wound is continuing to gradually fill in and appears to have improve approximation along superior/medial boarder. Epiboled edges present along ~ 50% or boarder and continue to require selevtive debridement. Slough easily debrided from wound bed as well. Continued with silver hydrofiber to fill in depth of wound bed and alginate, ABD, and 2x2 to absorb drainage. She will continue to benefit from skilled wound care to improve healing.    Wound Therapy - Functional Problem List  difficulty walking; donning boot  Factors Delaying/Impairing Wound Healing  Altered sensation;Diabetes Mellitus;Multiple medical problems    Hydrotherapy Plan  Debridement;Dressing change;Patient/family education    Wound Therapy - Frequency  2X / week   extend for 6 more weeks for a total of 15 weeks.    Wound Therapy - Current Recommendations  PT    Wound Plan  Measure on Monday. Continue topical agents as appropriate to maintain healing environment.    Dressing   silverhydrofiber, calcium alginate,4x4,  Kerlix and netting #5         PT Short Term Goals - 09/29/18 1443      PT SHORT TERM GOAL #1   Title  PT to verbalize the signs and sx of cellulitis and to contact MD ASAP     Time  1    Period  Weeks    Status  Achieved      PT SHORT TERM GOAL #2   Title  PT callous  to be removed from wound to allow decrease pressure to allow wound to heal     Time  3    Period  Weeks    Status  Achieved      PT SHORT TERM GOAL #3   Title  wound drainage to decrease to scant to min to allow pt to be able to don socks without soiling     Time  3    Period  Weeks    Status  On-going      PT SHORT TERM GOAL #4   Title  epiboled edges to be gone to allow wound to begin healing     Time  3    Period  Weeks    Status  Partially Met        PT Long Term Goals - 09/25/18 1522      PT LONG TERM GOAL #1   Title  PT wound to have no depth to allow pt to be comfortable with self care     Time  6    Period  Weeks    Status  Not Met      PT LONG TERM GOAL #2   Title  PT wound length and width to have decreased at least 1 cm to demonstrate a healing environment for pt to begin self care.     Time  6    Period  Weeks    Status  Not Met      PT LONG TERM GOAL #3   Title  PT edema to have decreased to facilitate donning of boot.     Time  6    Period  Weeks    Status  Achieved         Plan - 10/24/18 1259    Clinical Impression Statement  see above    Rehab Potential  Fair    PT Frequency  2x / week    PT Duration  6 weeks    PT Treatment/Interventions  ADLs/Self Care Home Management;Patient/family education;Other (comment)   debridement/dressing change   PT Next Visit Plan  see above    Consulted and Agree with Plan of Care  Patient       Patient will benefit from skilled therapeutic intervention in order to improve the following deficits and impairments:  Pain, Decreased skin integrity, Difficulty walking, Increased edema  Visit Diagnosis: Localized edema  Open wound of right foot with complication, sequela  Pain in right foot     Problem List There are no active problems  to display for this patient.   Kipp Brood, PT, DPT, [facility] Physical Therapist with [facility]  10/24/2018 12:59 PM    [place], [place], [address] Phone: [phone]   Fax:  [phone]  Name: Suzanne Tran MRN: [id number] Date of Birth: 02-16-1956

## 2018-10-27 ENCOUNTER — Other Ambulatory Visit: Payer: Self-pay

## 2018-10-27 ENCOUNTER — Ambulatory Visit (HOSPITAL_COMMUNITY): Payer: Self-pay

## 2018-10-27 ENCOUNTER — Encounter (HOSPITAL_COMMUNITY): Payer: Self-pay

## 2018-10-27 DIAGNOSIS — R600 Localized edema: Secondary | ICD-10-CM

## 2018-10-27 DIAGNOSIS — S91301S Unspecified open wound, right foot, sequela: Secondary | ICD-10-CM

## 2018-10-27 DIAGNOSIS — M79671 Pain in right foot: Secondary | ICD-10-CM

## 2018-10-27 NOTE — Therapy (Signed)
[place] [address] [place], [place], [address] Phone: [phone]   Fax:  [phone]  Wound Care Therapy/Progress Note  Patient Details  Name: Suzanne Tran MRN: [id number] Date of Birth: 1956-05-26 No data recorded  Encounter Date: 10/27/2018  Progress Note Reporting Period 09/29/18 to 10/27/18  See note below for Objective Data and Assessment of Progress/Goals.     PT End of Session - 10/27/18 1218    Visit Number  30    Number of Visits  34    Date for PT Re-Evaluation  10/23/18    Authorization Type  no insurance, self pay    Authorization Time Period  07/18/18-08/28/18; second: 08/21/18-09/25/18; third: 09/25/18-11/06/18    PT Start Time  0904    PT Stop Time  0943    PT Time Calculation (min)  39 min    Activity Tolerance  Patient tolerated treatment well;No increased pain    Behavior During Therapy  WFL for tasks assessed/performed       Past Medical History:  Diagnosis Date  . Diabetes mellitus without complication ([place])   . Hypertension   . Renal disorder     Past Surgical History:  Procedure Laterality Date  . ANKLE SURGERY    . KNEE SURGERY    . REPLACEMENT TOTAL KNEE      There were no vitals filed for this visit.     Wound Therapy - 10/27/18 0943    Subjective  Patient arrives with dressing intact and reports there was just a spot on the bottom of her dressing so she added a gauze pad but did not remove any of her wrap.     Patient and Family Stated Goals  wound to heal     Date of Onset  06/20/18    Prior Treatments  self care     Pain Scale  0-10    Pain Score  0-No pain    Evaluation and Treatment Procedures Explained to Patient/Family  Yes    Evaluation and Treatment Procedures  agreed to    Wound Properties Date First Assessed: 07/18/18 Time First Assessed: 0827 Wound Type: Diabetic ulcer Location: Foot Location Orientation: Right Wound Description (Comments): plantar aspect  Present on Admission:  Yes   Dressing Type  Silver hydrofiber;Gauze (Comment)    Dressing Changed  Changed    Dressing Status  Old drainage    Dressing Change Frequency  PRN    Site / Wound Assessment  Red    % Wound base Red or Granulating  95%    % Wound base Yellow/Fibrinous Exudate  0%    % Wound base Other/Granulation Tissue (Comment)  5%   callous perimeter   Peri-wound Assessment  Erythema (non-blanchable);Maceration    Wound Length (cm)  1.9 cm    Wound Width (cm)  0.9 cm   1.1   Wound Depth (cm)  0.6 cm   0.5   Wound Volume (cm^3)  1.03 cm^3    Wound Surface Area (cm^2)  1.71 cm^2    Margins  Epibole (rolled edges)   75%   Closure  None    Drainage Amount  Moderate    Drainage Description  Serous    Treatment  Cleansed;Debridement (Selective)    Selective Debridement - Location  slough from wound bed, epiboled edges, callous surrounding wound    Selective Debridement - Tools Used  Forceps;Scalpel    Selective Debridement - Tissue Removed  callous, slough devitalized tissue  Wound Therapy - Clinical Statement  Patient's wound presents with gradual reduction in size and increased tissue filling in along the distal wound bed. She continues to present with epiboled edges requiring debridement and slough was easily removed with cleansing and debridement with forceps. Continued with silver hydrofiber to fill in depth of wound bed and alginate, ABD, and 2x2 to absorb drainage. She will continue to benefit from skilled wound care to improve healing. She may benefit from pulsed lavage as wound is healing gradual and this may reduce excess bioburden in wound bed that could be a factor.     Wound Therapy - Functional Problem List  difficulty walking; donning boot     Factors Delaying/Impairing Wound Healing  Altered sensation;Diabetes Mellitus;Multiple medical problems    Hydrotherapy Plan  Debridement;Dressing change;Patient/family education    Wound Therapy - Frequency  2X / week   extend for 6 more weeks  for a total of 15 weeks.    Wound Therapy - Current Recommendations  PT    Wound Plan  Measure on Monday. Continue topical agents as appropriate to maintain healing environment. Consider beginning pulsed lavage again.     Dressing   silverhydrofiber, calcium alginate,4x4,  Kerlix and netting #5        PT Short Term Goals - 09/29/18 1443      PT SHORT TERM GOAL #1   Title  PT to verbalize the signs and sx of cellulitis and to contact MD ASAP     Time  1    Period  Weeks    Status  Achieved      PT SHORT TERM GOAL #2   Title  PT callous to be removed from wound to allow decrease pressure to allow wound to heal     Time  3    Period  Weeks    Status  Achieved      PT SHORT TERM GOAL #3   Title  wound drainage to decrease to scant to min to allow pt to be able to don socks without soiling     Time  3    Period  Weeks    Status  On-going      PT SHORT TERM GOAL #4   Title  epiboled edges to be gone to allow wound to begin healing     Time  3    Period  Weeks    Status  Partially Met        PT Long Term Goals - 09/25/18 1522      PT LONG TERM GOAL #1   Title  PT wound to have no depth to allow pt to be comfortable with self care     Time  6    Period  Weeks    Status  Not Met      PT LONG TERM GOAL #2   Title  PT wound length and width to have decreased at least 1 cm to demonstrate a healing environment for pt to begin self care.     Time  6    Period  Weeks    Status  Not Met      PT LONG TERM GOAL #3   Title  PT edema to have decreased to facilitate donning of boot.     Time  6    Period  Weeks    Status  Achieved       Plan - 10/27/18 1218    Clinical Impression Statement  see above  Rehab Potential  Fair    PT Frequency  2x / week    PT Duration  6 weeks    PT Treatment/Interventions  ADLs/Self Care Home Management;Patient/family education;Other (comment)   debridement/dressing change   PT Next Visit Plan  see above    Consulted and Agree with Plan  of Care  Patient       Patient will benefit from skilled therapeutic intervention in order to improve the following deficits and impairments:  Pain, Decreased skin integrity, Difficulty walking, Increased edema  Visit Diagnosis: Localized edema  Open wound of right foot with complication, sequela  Pain in right foot     Problem List There are no active problems to display for this patient.   Kipp Brood, PT, DPT, [facility] Physical Therapist with [facility]  10/27/2018 12:20 PM    [place] [address] [place], [place], [address] Phone: [phone]   Fax:  [phone]  Name: Suzanne Tran MRN: [id number] Date of Birth: 04/28/1956

## 2018-10-29 ENCOUNTER — Ambulatory Visit (HOSPITAL_COMMUNITY): Payer: Self-pay

## 2018-10-31 ENCOUNTER — Other Ambulatory Visit: Payer: Self-pay

## 2018-10-31 ENCOUNTER — Encounter (HOSPITAL_COMMUNITY): Payer: Self-pay

## 2018-10-31 ENCOUNTER — Ambulatory Visit (HOSPITAL_COMMUNITY): Payer: Self-pay

## 2018-10-31 DIAGNOSIS — S91301S Unspecified open wound, right foot, sequela: Secondary | ICD-10-CM

## 2018-10-31 DIAGNOSIS — M79671 Pain in right foot: Secondary | ICD-10-CM

## 2018-10-31 DIAGNOSIS — R600 Localized edema: Secondary | ICD-10-CM

## 2018-10-31 NOTE — Therapy (Signed)
[place], [place], [address] Phone: [phone]   Fax:  [phone]  Wound Care Therapy  Patient Details  Name: Suzanne Tran MRN: [id number] Date of Birth: 1955-12-08 No data recorded  Encounter Date: 10/31/2018  PT End of Session - 10/31/18 1251    Visit Number  31    Number of Visits  34    Date for PT Re-Evaluation  10/23/18    Authorization Type  no insurance, self pay    Authorization Time Period  07/18/18-08/28/18; second: 08/21/18-09/25/18; third: 09/25/18-11/06/18    PT Start Time  0904    PT Stop Time  0947    PT Time Calculation (min)  43 min    Activity Tolerance  Patient tolerated treatment well;No increased pain    Behavior During Therapy  WFL for tasks assessed/performed       Past Medical History:  Diagnosis Date  . Diabetes mellitus without complication ([place])   . Hypertension   . Renal disorder     Past Surgical History:  Procedure Laterality Date  . ANKLE SURGERY    . KNEE SURGERY    . REPLACEMENT TOTAL KNEE      There were no vitals filed for this visit.   Wound Therapy - 10/31/18 1246    Subjective  Patient reports she could not remove her dressign with scissor or by unwrapping it because it was too thick. She state she just placed guaze pads between the wrap and netting each day.     Patient and Family Stated Goals  wound to heal     Date of Onset  06/20/18    Prior Treatments  self care     Pain Scale  0-10    Pain Score  0-No pain    Evaluation and Treatment Procedures Explained to Patient/Family  Yes    Evaluation and Treatment Procedures  agreed to    Wound Properties Date First Assessed: 07/18/18 Time First Assessed: 0827 Wound Type: Diabetic ulcer Location: Foot Location Orientation: Right Wound Description (Comments): plantar aspect  Present on Admission: Yes   Dressing Type  Silver hydrofiber;Gauze (Comment)    Dressing Changed  Changed    Dressing Status  Old drainage    Dressing Change Frequency  PRN    Site / Wound Assessment  Red    % Wound base Red or Granulating  95%    % Wound base Yellow/Fibrinous Exudate  0%    % Wound base Other/Granulation Tissue (Comment)  5%   callous perimeter   Peri-wound Assessment  Erythema (non-blanchable);Maceration    Margins  Epibole (rolled edges)   75%   Closure  None    Drainage Amount  Moderate    Drainage Description  Serous    Treatment  Cleansed;Debridement (Selective);Hydrotherapy (Pulse lavage)    Pulsed lavage therapy - wound location  wound bed     Pulsed Lavage with Suction (psi)  4 psi    Pulsed Lavage with Suction - Normal Saline Used  1000 mL    Pulsed Lavage Tip  Tip with splash shield    Selective Debridement - Location  slough from wound bed, epiboled edges, callous surrounding wound    Selective Debridement - Tools Used  Forceps;Scalpel    Selective Debridement - Tissue Removed  callous, slough devitalized tissue    Wound Therapy - Clinical Statement  Patient's wound shows minimal change with gradual reducetion in depth at slower rate than would like. Margins continue  to be epiboled requiring selective debridement with forceps. This session re-introduced pulsed lavage as intervention to irrigate wound bed and address potential bioburden that may be impacting rate of healing. Continued with silver hydrofiber to fill in depth of wound bed and alginate, ABD, and 2x2 to absorb drainage. She will continue to benefit from skilled wound care to improve healing.    Wound Therapy - Functional Problem List  difficulty walking; donning boot     Factors Delaying/Impairing Wound Healing  Altered sensation;Diabetes Mellitus;Multiple medical problems    Hydrotherapy Plan  Debridement;Dressing change;Patient/family education    Wound Therapy - Frequency  2X / week   extend for 6 more weeks for a total of 15 weeks.    Wound Therapy - Current Recommendations  PT    Wound Plan  Measure on Monday. Continue topical agents  as appropriate to maintain healing environment. Consider beginning pulsed lavage again.     Dressing   silverhydrofiber, calcium alginate,4x4,  Kerlix and netting #5        PT Short Term Goals - 09/29/18 1443      PT SHORT TERM GOAL #1   Title  PT to verbalize the signs and sx of cellulitis and to contact MD ASAP     Time  1    Period  Weeks    Status  Achieved      PT SHORT TERM GOAL #2   Title  PT callous to be removed from wound to allow decrease pressure to allow wound to heal     Time  3    Period  Weeks    Status  Achieved      PT SHORT TERM GOAL #3   Title  wound drainage to decrease to scant to min to allow pt to be able to don socks without soiling     Time  3    Period  Weeks    Status  On-going      PT SHORT TERM GOAL #4   Title  epiboled edges to be gone to allow wound to begin healing     Time  3    Period  Weeks    Status  Partially Met        PT Long Term Goals - 09/25/18 1522      PT LONG TERM GOAL #1   Title  PT wound to have no depth to allow pt to be comfortable with self care     Time  6    Period  Weeks    Status  Not Met      PT LONG TERM GOAL #2   Title  PT wound length and width to have decreased at least 1 cm to demonstrate a healing environment for pt to begin self care.     Time  6    Period  Weeks    Status  Not Met      PT LONG TERM GOAL #3   Title  PT edema to have decreased to facilitate donning of boot.     Time  6    Period  Weeks    Status  Achieved        Plan - 10/31/18 1251    Clinical Impression Statement  see above    Rehab Potential  Fair    PT Frequency  2x / week    PT Duration  6 weeks    PT Treatment/Interventions  ADLs/Self Care Home Management;Patient/family education;Other (comment)   debridement/dressing  change   PT Next Visit Plan  see above    Consulted and Agree with Plan of Care  Patient       Patient will benefit from skilled therapeutic intervention in order to improve the following deficits  and impairments:  Pain, Decreased skin integrity, Difficulty walking, Increased edema  Visit Diagnosis: Localized edema  Open wound of right foot with complication, sequela  Pain in right foot     Problem List There are no active problems to display for this patient.   Kipp Brood, PT, DPT, [facility] Physical Therapist with [facility]  10/31/2018 12:52 PM    [place], [place], [address] Phone: [phone]   Fax:  [phone]  Name: Suzanne Tran MRN: [id number] Date of Birth: 09/24/1955

## 2018-11-03 ENCOUNTER — Encounter (HOSPITAL_COMMUNITY): Payer: Self-pay

## 2018-11-03 ENCOUNTER — Other Ambulatory Visit: Payer: Self-pay

## 2018-11-03 ENCOUNTER — Ambulatory Visit (HOSPITAL_COMMUNITY): Payer: Self-pay

## 2018-11-03 DIAGNOSIS — S91301S Unspecified open wound, right foot, sequela: Secondary | ICD-10-CM

## 2018-11-03 DIAGNOSIS — R600 Localized edema: Secondary | ICD-10-CM

## 2018-11-03 DIAGNOSIS — M79671 Pain in right foot: Secondary | ICD-10-CM

## 2018-11-03 NOTE — Therapy (Signed)
[facility] [address] [place], [place], [address] Phone: [phone]   Fax:  [phone]  Wound Care Therapy/Progress Note  Patient Details  Name: Suzanne Tran MRN: [id number] Date of Birth: 1956-04-10 No data recorded  Encounter Date: 11/03/2018   Progress Note Reporting Period 10/27/18 to 11/03/18  See note below for Objective Data and Assessment of Progress/Goals.    PT End of Session - 11/03/18 1354    Visit Number  32    Number of Visits  42    Date for PT Re-Evaluation  12/01/18    Authorization Type  no insurance, self pay    Authorization Time Period  07/18/18-08/28/18; second: 08/21/18-09/25/18; third: 09/25/18-11/06/18; 11/03/18-12/05/18    PT Start Time  0903    PT Stop Time  0947    PT Time Calculation (min)  44 min    Activity Tolerance  Patient tolerated treatment well;No increased pain    Behavior During Therapy  WFL for tasks assessed/performed       Past Medical History:  Diagnosis Date  . Diabetes mellitus without complication ([place])   . Hypertension   . Renal disorder     Past Surgical History:  Procedure Laterality Date  . ANKLE SURGERY    . KNEE SURGERY    . REPLACEMENT TOTAL KNEE      There were no vitals filed for this visit.     Wound Therapy - 11/03/18 1346    Subjective  Patient arrives with dressing intact and has not removed/rewrapped it.    Patient and Family Stated Goals  wound to heal     Date of Onset  06/20/18    Prior Treatments  self care     Pain Scale  0-10    Pain Score  0-No pain    Evaluation and Treatment Procedures Explained to Patient/Family  Yes    Evaluation and Treatment Procedures  agreed to    Wound Properties Date First Assessed: 07/18/18 Time First Assessed: 0827 Wound Type: Diabetic ulcer Location: Foot Location Orientation: Right Wound Description (Comments): plantar aspect  Present on Admission: Yes   Dressing Type  Silver hydrofiber;Gauze (Comment)    Dressing Status   Old drainage    Dressing Change Frequency  PRN    Site / Wound Assessment  Red    % Wound base Red or Granulating  95%    % Wound base Yellow/Fibrinous Exudate  0%    % Wound base Other/Granulation Tissue (Comment)  5%   callous perimeter   Peri-wound Assessment  Erythema (non-blanchable);Maceration    Wound Length (cm)  1.6 cm   1.9   Wound Width (cm)  0.8 cm   0.9   Wound Depth (cm)  0.5 cm   0.6   Wound Volume (cm^3)  0.64 cm^3    Wound Surface Area (cm^2)  1.28 cm^2    Margins  Epibole (rolled edges)   75%   Closure  None    Drainage Amount  Moderate    Drainage Description  Serous    Treatment  Cleansed;Debridement (Selective);Hydrotherapy (Pulse lavage)    Pulsed lavage therapy - wound location  wound bed     Pulsed Lavage with Suction (psi)  4 psi    Pulsed Lavage with Suction - Normal Saline Used  1000 mL    Pulsed Lavage Tip  Tip with splash shield    Selective Debridement - Location  slough from wound bed, epiboled edges, callous surrounding wound  Selective Debridement - Tools Used  Forceps;Scalpel    Selective Debridement - Tissue Removed  callous, slough devitalized tissue    Wound Therapy - Clinical Statement  Patient's wound presents with reduction in size and depth this session. The wound bed is filling in well in the distal portion and has greatest depth centrally in wound bed. Wound base has increased red coloration to but margins continue to be epiboled and white/pale. Continued with pulsed lavage to irrigate wound bed and address potential bioburden that may be impacting rate of healing. Silver hydrofiber applied to fill in depth of wound bed and alginate, ABD, and 2x2 to absorb drainage. She will continue to benefit from skilled wound care to improve healing.    Wound Therapy - Functional Problem List  difficulty walking; donning boot     Factors Delaying/Impairing Wound Healing  Altered sensation;Diabetes Mellitus;Multiple medical problems    Hydrotherapy Plan   Debridement;Dressing change;Patient/family education    Wound Therapy - Frequency  2X / week   extend for 6 more weeks for a total of 15 weeks.    Wound Therapy - Current Recommendations  PT    Wound Plan  Measure on Monday. Continue topical agents as appropriate to maintain healing environment. Continue pulsed lavage again.     Dressing   silverhydrofiber, calcium alginate,4x4,  Kerlix and netting #5        PT Short Term Goals - 11/03/18 1356      PT SHORT TERM GOAL #1   Title  PT to verbalize the signs and sx of cellulitis and to contact MD ASAP     Time  1    Period  Weeks    Status  Achieved      PT SHORT TERM GOAL #2   Title  PT callous to be removed from wound to allow decrease pressure to allow wound to heal     Time  3    Period  Weeks    Status  Achieved      PT SHORT TERM GOAL #3   Title  wound drainage to decrease to scant to min to allow pt to be able to don socks without soiling     Time  3    Period  Weeks    Status  On-going      PT SHORT TERM GOAL #4   Title  epiboled edges to be gone to allow wound to begin healing     Time  3    Period  Weeks    Status  Partially Met        PT Long Term Goals - 11/03/18 1356      PT LONG TERM GOAL #1   Title  PT wound to have no depth to allow pt to be comfortable with self care     Time  6    Period  Weeks    Status  On-going      PT LONG TERM GOAL #2   Title  PT wound length and width to have decreased at least 1 cm to demonstrate a healing environment for pt to begin self care.     Time  6    Period  Weeks    Status  On-going      PT LONG TERM GOAL #3   Title  PT edema to have decreased to facilitate donning of boot.     Time  6    Period  Weeks    Status  Achieved  Plan - 11/03/18 1356    Clinical Impression Statement  see above    Rehab Potential  Fair    PT Frequency  2x / week    PT Duration  6 weeks    PT Treatment/Interventions  ADLs/Self Care Home Management;Patient/family  education;Other (comment)   debridement/dressing change   PT Next Visit Plan  see above    Consulted and Agree with Plan of Care  Patient       Patient will benefit from skilled therapeutic intervention in order to improve the following deficits and impairments:  Pain, Decreased skin integrity, Difficulty walking, Increased edema  Visit Diagnosis: Localized edema  Open wound of right foot with complication, sequela  Pain in right foot     Problem List There are no active problems to display for this patient.   Kipp Brood, PT, DPT, [facility] Physical Therapist with [facility]  11/03/2018 2:00 PM    [facility] [place], [place], [address] Phone: [phone]   Fax:  [phone]  Name: Suzanne Tran MRN: [id number] Date of Birth: 1956/04/26

## 2018-11-07 ENCOUNTER — Ambulatory Visit (HOSPITAL_COMMUNITY): Payer: Self-pay

## 2018-11-07 ENCOUNTER — Telehealth (HOSPITAL_COMMUNITY): Payer: Self-pay

## 2018-11-07 NOTE — Telephone Encounter (Signed)
Suzanne Tran called to cx due to blood suger dropping to 40 this morning and she doesn't have the strength to get here today

## 2018-11-10 ENCOUNTER — Encounter (HOSPITAL_COMMUNITY): Payer: Self-pay

## 2018-11-10 ENCOUNTER — Ambulatory Visit (HOSPITAL_COMMUNITY): Payer: Self-pay

## 2018-11-10 ENCOUNTER — Other Ambulatory Visit: Payer: Self-pay

## 2018-11-10 DIAGNOSIS — R600 Localized edema: Secondary | ICD-10-CM

## 2018-11-10 DIAGNOSIS — S91301S Unspecified open wound, right foot, sequela: Secondary | ICD-10-CM

## 2018-11-10 DIAGNOSIS — M79671 Pain in right foot: Secondary | ICD-10-CM

## 2018-11-10 NOTE — Therapy (Signed)
[facility] [place], [place], [address] Phone: [phone]   Fax:  [phone]  Wound Care Therapy  Patient Details  Name: Suzanne Tran MRN: [id number] Date of Birth: 1955-10-09 No data recorded  Encounter Date: 11/10/2018  PT End of Session - 11/10/18 1210    Visit Number  33    Number of Visits  42    Date for PT Re-Evaluation  12/01/18    Authorization Type  no insurance, self pay    Authorization Time Period  07/18/18-08/28/18; second: 08/21/18-09/25/18; third: 09/25/18-11/06/18; 11/03/18-12/05/18    PT Start Time  0905    PT Stop Time  0949    PT Time Calculation (min)  44 min    Activity Tolerance  Patient tolerated treatment well;No increased pain    Behavior During Therapy  WFL for tasks assessed/performed       Past Medical History:  Diagnosis Date  . Diabetes mellitus without complication ([place])   . Hypertension   . Renal disorder     Past Surgical History:  Procedure Laterality Date  . ANKLE SURGERY    . KNEE SURGERY    . REPLACEMENT TOTAL KNEE      There were no vitals filed for this visit.   Wound Therapy - 11/10/18 1028    Subjective  Patients reports she is well and that she is feeling better than last Friday. She states her blood sugar was 83 this morning.     Patient and Family Stated Goals  wound to heal     Date of Onset  06/20/18    Prior Treatments  self care     Pain Scale  0-10    Pain Score  0-No pain    Evaluation and Treatment Procedures Explained to Patient/Family  Yes    Evaluation and Treatment Procedures  agreed to    Wound Properties Date First Assessed: 07/18/18 Time First Assessed: 0827 Wound Type: Diabetic ulcer Location: Foot Location Orientation: Right Wound Description (Comments): plantar aspect  Present on Admission: Yes   Dressing Type  Gauze (Comment);Alginate;Abdominal pads    Dressing Status  Old drainage    Dressing Change Frequency  PRN    Site / Wound Assessment  Red     % Wound base Red or Granulating  95%    % Wound base Yellow/Fibrinous Exudate  0%    % Wound base Other/Granulation Tissue (Comment)  5%   callous perimeter   Peri-wound Assessment  Erythema (non-blanchable);Maceration    Wound Length (cm)  1.7 cm   1.6   Wound Width (cm)  1 cm   0.8   Wound Depth (cm)  0.4 cm   0.5   Wound Volume (cm^3)  0.68 cm^3    Wound Surface Area (cm^2)  1.7 cm^2    Margins  Epibole (rolled edges)   75%   Closure  None    Drainage Amount  Moderate    Drainage Description  Serous    Treatment  Cleansed;Debridement (Selective)    Pulsed lavage therapy - wound location  wound bed     Pulsed Lavage with Suction (psi)  4 psi    Pulsed Lavage with Suction - Normal Saline Used  1000 mL    Pulsed Lavage Tip  Tip with splash shield    Selective Debridement - Location  slough from wound bed, epiboled edges, callous surrounding wound    Selective Debridement - Tools Used  Forceps;Scalpel    Selective Debridement -  Tissue Removed  callous, slough devitalized tissue    Wound Therapy - Clinical Statement  Patient has not been seen for 1 week and dressing remains intact. Maceration is present and has softened calloused periwound. Callous was more easily debrided this date. Wound bed has reduced depth distally and greatest depth remains laterally close to wound margin. Continued with selective debridement to remove slough and address epiboled edges and pulsed lavage to irrigate wound and address potential bioburden. Transitioned to calcium alginate dressing to wound bed followed by ABD, and 2x2 to manage drainage. She will continue to benefit from skilled wound care to improve healing.    Wound Therapy - Functional Problem List  difficulty walking; donning boot     Factors Delaying/Impairing Wound Healing  Altered sensation;Diabetes Mellitus;Multiple medical problems    Hydrotherapy Plan  Debridement;Dressing change;Patient/family education    Wound Therapy - Frequency  2X /  week   extend for 6 more weeks for a total of 15 weeks.    Wound Therapy - Current Recommendations  PT    Wound Plan  Measure on Monday. Continue topical agents as appropriate to maintain healing environment. Continue pulsed lavage again.     Dressing   calcium alginate, ABD, 4x4, Kerlix and netting #5        PT Short Term Goals - 11/03/18 1356      PT SHORT TERM GOAL #1   Title  PT to verbalize the signs and sx of cellulitis and to contact MD ASAP     Time  1    Period  Weeks    Status  Achieved      PT SHORT TERM GOAL #2   Title  PT callous to be removed from wound to allow decrease pressure to allow wound to heal     Time  3    Period  Weeks    Status  Achieved      PT SHORT TERM GOAL #3   Title  wound drainage to decrease to scant to min to allow pt to be able to don socks without soiling     Time  3    Period  Weeks    Status  On-going      PT SHORT TERM GOAL #4   Title  epiboled edges to be gone to allow wound to begin healing     Time  3    Period  Weeks    Status  Partially Met        PT Long Term Goals - 11/03/18 1356      PT LONG TERM GOAL #1   Title  PT wound to have no depth to allow pt to be comfortable with self care     Time  6    Period  Weeks    Status  On-going      PT LONG TERM GOAL #2   Title  PT wound length and width to have decreased at least 1 cm to demonstrate a healing environment for pt to begin self care.     Time  6    Period  Weeks    Status  On-going      PT LONG TERM GOAL #3   Title  PT edema to have decreased to facilitate donning of boot.     Time  6    Period  Weeks    Status  Achieved            Plan - 11/10/18 1210  Clinical Impression Statement  see above    Rehab Potential  Fair    PT Frequency  2x / week    PT Duration  6 weeks    PT Treatment/Interventions  ADLs/Self Care Home Management;Patient/family education;Other (comment)   debridement/dressing change   PT Next Visit Plan  see above    Consulted  and Agree with Plan of Care  Patient       Patient will benefit from skilled therapeutic intervention in order to improve the following deficits and impairments:  Pain, Decreased skin integrity, Difficulty walking, Increased edema  Visit Diagnosis: Localized edema  Open wound of right foot with complication, sequela  Pain in right foot     Problem List There are no active problems to display for this patient.   Kipp Brood, PT, DPT, [facility] Physical Therapist with [facility]  11/10/2018 12:11 PM    [facility] [address] [place], [place], [address] Phone: [phone]   Fax:  [phone]  Name: Suzanne Tran MRN: [id number] Date of Birth: [birth year]

## 2018-11-14 ENCOUNTER — Ambulatory Visit: Payer: Self-pay | Admitting: Internal Medicine

## 2018-11-14 ENCOUNTER — Other Ambulatory Visit: Payer: Self-pay

## 2018-11-14 ENCOUNTER — Ambulatory Visit (HOSPITAL_COMMUNITY): Payer: Medicaid Other | Attending: Internal Medicine

## 2018-11-14 ENCOUNTER — Encounter (HOSPITAL_COMMUNITY): Payer: Self-pay

## 2018-11-14 ENCOUNTER — Telehealth: Payer: Self-pay | Admitting: Internal Medicine

## 2018-11-14 DIAGNOSIS — R600 Localized edema: Secondary | ICD-10-CM

## 2018-11-14 DIAGNOSIS — S91301S Unspecified open wound, right foot, sequela: Secondary | ICD-10-CM

## 2018-11-14 DIAGNOSIS — M79671 Pain in right foot: Secondary | ICD-10-CM | POA: Diagnosis present

## 2018-11-14 NOTE — Telephone Encounter (Signed)
Pt. Was trying to get in touch with Dr. Jethro Bolus in [facility]. Inadvertently called [facility] by mistake.

## 2018-11-14 NOTE — Telephone Encounter (Signed)
See telephone note.

## 2018-11-14 NOTE — Therapy (Signed)
[facility] [place], [place], [address] Phone: [phone]   Fax:  [phone]  Wound Care Therapy  Patient Details  Name: Suzanne Tran MRN: [id number] Date of Birth: Dec 07, 1955 No data recorded  Encounter Date: 11/14/2018  PT End of Session - 11/14/18 1201    Visit Number  34    Number of Visits  42    Date for PT Re-Evaluation  12/01/18    Authorization Type  no insurance, self pay    Authorization Time Period  07/18/18-08/28/18; second: 08/21/18-09/25/18; third: 09/25/18-11/06/18; 11/03/18-12/05/18    PT Start Time  0947    PT Stop Time  1030    PT Time Calculation (min)  43 min    Activity Tolerance  Patient tolerated treatment well;No increased pain    Behavior During Therapy  WFL for tasks assessed/performed       Past Medical History:  Diagnosis Date  . Diabetes mellitus without complication ([place])   . Hypertension   . Renal disorder     Past Surgical History:  Procedure Laterality Date  . ANKLE SURGERY    . KNEE SURGERY    . REPLACEMENT TOTAL KNEE      There were no vitals filed for this visit.     Wound Therapy - 11/14/18 1145    Subjective  Patient arrives with sad demenor. She discusses concerns with her daughter and grandaughter who injured herself in a fall this week.     Patient and Family Stated Goals  wound to heal     Date of Onset  06/20/18    Prior Treatments  self care     Pain Scale  0-10    Pain Score  0-No pain    Evaluation and Treatment Procedures Explained to Patient/Family  Yes    Evaluation and Treatment Procedures  agreed to    Wound Properties Date First Assessed: 07/18/18 Time First Assessed: 0827 Wound Type: Diabetic ulcer Location: Foot Location Orientation: Right Wound Description (Comments): plantar aspect  Present on Admission: Yes   Dressing Type  Gauze (Comment);Alginate;Abdominal pads;Silver hydrofiber    Dressing Changed  Changed    Dressing Status  Old drainage    Dressing  Change Frequency  PRN    Site / Wound Assessment  Red    % Wound base Red or Granulating  95%    % Wound base Yellow/Fibrinous Exudate  0%    % Wound base Other/Granulation Tissue (Comment)  5%   callous perimeter   Peri-wound Assessment  Erythema (non-blanchable);Maceration    Margins  Epibole (rolled edges)   75%   Closure  None    Drainage Amount  Moderate    Drainage Description  Serous    Treatment  Cleansed;Debridement (Selective)    Pulsed lavage therapy - wound location  wound bed     Pulsed Lavage with Suction (psi)  4 psi    Pulsed Lavage with Suction - Normal Saline Used  1000 mL    Pulsed Lavage Tip  Tip with splash shield    Selective Debridement - Location  slough from wound bed, epiboled edges, callous surrounding wound    Selective Debridement - Tools Used  Forceps;Scalpel    Selective Debridement - Tissue Removed  callous, slough devitalized tissue    Wound Therapy - Clinical Statement  Patient's wound continues to have maceration to the calloused periwound. The wound appears to have increased slough and deep pockets in wound bed continue not to fill  in. The wound continues to fill in more distally. Margins continue to be epiboled requiring selective debridement. The margins are weak as well and edematous as patient's wound continues to drain moderately. Returned to silver hydrofiber for wound bed dressing. Patient will continue to benefit from skilled wound care to improve healing.    Wound Therapy - Functional Problem List  difficulty walking; donning boot     Factors Delaying/Impairing Wound Healing  Altered sensation;Diabetes Mellitus;Multiple medical problems    Hydrotherapy Plan  Debridement;Dressing change;Patient/family education    Wound Therapy - Frequency  2X / week   extend for 6 more weeks for a total of 15 weeks.    Wound Therapy - Current Recommendations  PT    Wound Plan  Measure on Monday. Continue topical agents as appropriate to maintain healing  environment. Continue pulsed lavage again.     Dressing   Silver hydrofiber, calcium alginate, ABD, 4x4, Kerlix and netting #5         PT Short Term Goals - 11/03/18 1356      PT SHORT TERM GOAL #1   Title  PT to verbalize the signs and sx of cellulitis and to contact MD ASAP     Time  1    Period  Weeks    Status  Achieved      PT SHORT TERM GOAL #2   Title  PT callous to be removed from wound to allow decrease pressure to allow wound to heal     Time  3    Period  Weeks    Status  Achieved      PT SHORT TERM GOAL #3   Title  wound drainage to decrease to scant to min to allow pt to be able to don socks without soiling     Time  3    Period  Weeks    Status  On-going      PT SHORT TERM GOAL #4   Title  epiboled edges to be gone to allow wound to begin healing     Time  3    Period  Weeks    Status  Partially Met        PT Long Term Goals - 11/03/18 1356      PT LONG TERM GOAL #1   Title  PT wound to have no depth to allow pt to be comfortable with self care     Time  6    Period  Weeks    Status  On-going      PT LONG TERM GOAL #2   Title  PT wound length and width to have decreased at least 1 cm to demonstrate a healing environment for pt to begin self care.     Time  6    Period  Weeks    Status  On-going      PT LONG TERM GOAL #3   Title  PT edema to have decreased to facilitate donning of boot.     Time  6    Period  Weeks    Status  Achieved        Plan - 11/14/18 1202    Clinical Impression Statement  see above    Rehab Potential  Fair    PT Frequency  2x / week    PT Duration  6 weeks    PT Treatment/Interventions  ADLs/Self Care Home Management;Patient/family education;Other (comment)   debridement/dressing change   PT Next Visit Plan  see  above    Consulted and Agree with Plan of Care  Patient       Patient will benefit from skilled therapeutic intervention in order to improve the following deficits and impairments:  Pain, Decreased  skin integrity, Difficulty walking, Increased edema  Visit Diagnosis: Localized edema  Open wound of right foot with complication, sequela  Pain in right foot     Problem List There are no active problems to display for this patient.   Kipp Brood, PT, DPT, [facility] Physical Therapist with [facility]  11/14/2018 12:03 PM    [place], [place], [address] Phone: [phone]   Fax:  [phone]  Name: Zehava Turski MRN: [id number] Date of Birth: 30-Aug-1955

## 2018-11-17 ENCOUNTER — Encounter (HOSPITAL_COMMUNITY): Payer: Self-pay

## 2018-11-17 ENCOUNTER — Ambulatory Visit (HOSPITAL_COMMUNITY): Payer: Medicaid Other

## 2018-11-17 ENCOUNTER — Other Ambulatory Visit: Payer: Self-pay

## 2018-11-17 DIAGNOSIS — S91301S Unspecified open wound, right foot, sequela: Secondary | ICD-10-CM

## 2018-11-17 DIAGNOSIS — R600 Localized edema: Secondary | ICD-10-CM | POA: Diagnosis not present

## 2018-11-17 DIAGNOSIS — M79671 Pain in right foot: Secondary | ICD-10-CM

## 2018-11-17 NOTE — Therapy (Signed)
[place], [place], [address] Phone: [phone]   Fax:  [phone]  Wound Care Therapy  Patient Details  Name: Suzanne Tran MRN: [id number] Date of Birth: [birth year] No data recorded  Encounter Date: 11/17/2018  PT End of Session - 11/17/18 1203    Visit Number  35    Number of Visits  42    Date for PT Re-Evaluation  12/01/18    Authorization Type  no insurance, self pay    Authorization Time Period  07/18/18-08/28/18; second: 08/21/18-09/25/18; third: 09/25/18-11/06/18; 11/03/18-12/05/18    PT Start Time  0903    PT Stop Time  0945    PT Time Calculation (min)  42 min    Activity Tolerance  Patient tolerated treatment well;No increased pain    Behavior During Therapy  WFL for tasks assessed/performed       Past Medical History:  Diagnosis Date  . Diabetes mellitus without complication ([place])   . Hypertension   . Renal disorder     Past Surgical History:  Procedure Laterality Date  . ANKLE SURGERY    . KNEE SURGERY    . REPLACEMENT TOTAL KNEE      There were no vitals filed for this visit.  Wound Therapy - 11/17/18 1012    Subjective  Patient is more calm this session compared to last Friday. She reports she did see an ophthalmologist and they did several test on her. 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 She reports yesterday she removed all of her dressing with exception of the immediate dressing and tape at teh woundbed. She reports it was draining a lot more.     Patient and Family Stated Goals  wound to heal     Date of Onset  06/20/18    Prior Treatments  self care     Pain Scale  0-10    Pain Score  0-No pain    Evaluation and Treatment Procedures Explained to Patient/Family  Yes    Evaluation and Treatment Procedures  agreed to    Wound Properties Date First Assessed: 07/18/18 Time First Assessed:  0827 Wound Type: Diabetic ulcer Location: Foot Location Orientation: Right Wound Description (Comments): plantar aspect  Present on Admission: Yes   Dressing Type  Gauze (Comment);Alginate;Abdominal pads;Silver hydrofiber    Dressing Changed  Changed    Dressing Status  Old drainage    Dressing Change Frequency  PRN    Site / Wound Assessment  Red    % Wound base Red or Granulating  95%    % Wound base Yellow/Fibrinous Exudate  0%    % Wound base Other/Granulation Tissue (Comment)  5%   callous perimeter   Peri-wound Assessment  Erythema (non-blanchable);Maceration    Wound Length (cm)  1.6 cm   1.7   Wound Width (cm)  1.3 cm   1   Wound Depth (cm)  0.4 cm   0.4   Wound Volume (cm^3)  0.83 cm^3    Wound Surface Area (cm^2)  2.08 cm^2    Margins  Epibole (rolled edges)   75%   Closure  None    Drainage Amount  Moderate    Drainage Description  Serous    Treatment  Cleansed;Debridement (Selective)    Pulsed lavage therapy - wound location  wound bed     Pulsed Lavage  with Suction (psi)  4 psi    Pulsed Lavage with Suction - Normal Saline Used  1000 mL    Pulsed Lavage Tip  Tip with splash shield    Selective Debridement - Location  slough from wound bed, epiboled edges, callous surrounding wound    Selective Debridement - Tools Used  Forceps;Scalpel    Selective Debridement - Tissue Removed  callous, slough devitalized tissue    Wound Therapy - Clinical Statement  Patient presents with reduced callous around periwound this session however wound bed size has increased slightly. This session continued with selective debridement of callous and margins to promote attachment, pt continues to have improved attachment at inferior boarder but overall healing is slow. Educated patient this session on importance of contacting her MD for a referral to a podiatrist to obtain custom fit diabetic shoe to unweight the wound on her Rt foot. She is worried about the cost of it but verbalized her  understanding of the necessity to allow wound to heal. She will continue to benefit from skilled wound care to promote healing and may benefit from laser treatment to promote healing as well.    Wound Therapy - Functional Problem List  difficulty walking; donning boot     Factors Delaying/Impairing Wound Healing  Altered sensation;Diabetes Mellitus;Multiple medical problems    Hydrotherapy Plan  Debridement;Dressing change;Patient/family education    Wound Therapy - Frequency  2X / week   extend for 6 more weeks for a total of 15 weeks.    Wound Therapy - Current Recommendations  PT    Wound Plan  Measure on Monday. Continue topical agents as appropriate to maintain healing environment. Continue pulsed lavage again.     Dressing   Silver hydrofiber, calcium alginate, ABD, 4x4, Kerlix and netting #5       PT Short Term Goals - 11/03/18 1356      PT SHORT TERM GOAL #1   Title  PT to verbalize the signs and sx of cellulitis and to contact MD ASAP     Time  1    Period  Weeks    Status  Achieved      PT SHORT TERM GOAL #2   Title  PT callous to be removed from wound to allow decrease pressure to allow wound to heal     Time  3    Period  Weeks    Status  Achieved      PT SHORT TERM GOAL #3   Title  wound drainage to decrease to scant to min to allow pt to be able to don socks without soiling     Time  3    Period  Weeks    Status  On-going      PT SHORT TERM GOAL #4   Title  epiboled edges to be gone to allow wound to begin healing     Time  3    Period  Weeks    Status  Partially Met        PT Long Term Goals - 11/03/18 1356      PT LONG TERM GOAL #1   Title  PT wound to have no depth to allow pt to be comfortable with self care     Time  6    Period  Weeks    Status  On-going      PT LONG TERM GOAL #2   Title  PT wound length and width to have decreased at least 1  cm to demonstrate a healing environment for pt to begin self care.     Time  6    Period  Weeks     Status  On-going      PT LONG TERM GOAL #3   Title  PT edema to have decreased to facilitate donning of boot.     Time  6    Period  Weeks    Status  Achieved        Plan - 11/17/18 1259    Clinical Impression Statement  see above    Rehab Potential  Fair    PT Frequency  2x / week    PT Duration  6 weeks    PT Treatment/Interventions  ADLs/Self Care Home Management;Patient/family education;Other (comment)   debridement/dressing change   PT Next Visit Plan  see above    Consulted and Agree with Plan of Care  Patient       Patient will benefit from skilled therapeutic intervention in order to improve the following deficits and impairments:  Pain, Decreased skin integrity, Difficulty walking, Increased edema  Visit Diagnosis: Localized edema  Open wound of right foot with complication, sequela  Pain in right foot     Problem List There are no active problems to display for this patient.   Kipp Brood, PT, DPT, [facility] Physical Therapist with [facility]  11/17/2018 1:00 PM    [place] [address] [place], [place], [address] Phone: [phone]   Fax:  [phone]  Name: Suzanne Tran MRN: [id number] Date of Birth: 1955/12/29

## 2018-11-21 ENCOUNTER — Other Ambulatory Visit: Payer: Self-pay

## 2018-11-21 ENCOUNTER — Encounter (HOSPITAL_COMMUNITY): Payer: Self-pay

## 2018-11-21 ENCOUNTER — Ambulatory Visit (HOSPITAL_COMMUNITY): Payer: Medicaid Other

## 2018-11-21 DIAGNOSIS — R600 Localized edema: Secondary | ICD-10-CM | POA: Diagnosis not present

## 2018-11-21 DIAGNOSIS — S91301S Unspecified open wound, right foot, sequela: Secondary | ICD-10-CM

## 2018-11-21 DIAGNOSIS — M79671 Pain in right foot: Secondary | ICD-10-CM

## 2018-11-21 NOTE — Therapy (Signed)
[place], [place], [address] Phone: [phone]   Fax:  [phone]  Wound Care Therapy  Patient Details  Name: Suzanne Tran MRN: [id number] Date of Birth: 03/06/1956 No data recorded  Encounter Date: 11/21/2018  PT End of Session - 11/21/18 1238    Visit Number  36    Number of Visits  42    Date for PT Re-Evaluation  12/01/18    Authorization Type  no insurance, self pay    Authorization Time Period  07/18/18-08/28/18; second: 08/21/18-09/25/18; third: 09/25/18-11/06/18; 11/03/18-12/05/18; 11/21/18-01/16/19    PT Start Time  0945    PT Stop Time  1034    PT Time Calculation (min)  49 min    Activity Tolerance  Patient tolerated treatment well;No increased pain    Behavior During Therapy  WFL for tasks assessed/performed       Past Medical History:  Diagnosis Date  . Diabetes mellitus without complication ([place])   . Hypertension   . Renal disorder     Past Surgical History:  Procedure Laterality Date  . ANKLE SURGERY    . KNEE SURGERY    . REPLACEMENT TOTAL KNEE      There were no vitals filed for this visit.       Wound Therapy - 11/21/18 1230    Subjective  Patient arrives reporting she is regaining some of her vision and she is very excited about that. She reports she was unable to get in touch with her PCP about a referral to a podiatrist.     Patient and Family Stated Goals  wound to heal     Date of Onset  06/20/18    Prior Treatments  self care     Pain Scale  0-10    Pain Score  0-No pain    Evaluation and Treatment Procedures Explained to Patient/Family  Yes    Evaluation and Treatment Procedures  agreed to    Wound Properties Date First Assessed: 07/18/18 Time First Assessed: 0827 Wound Type: Diabetic ulcer Location: Foot Location Orientation: Right Wound Description (Comments): plantar aspect  Present on Admission: Yes   Dressing Type  Gauze (Comment);Alginate;Abdominal pads;Silver hydrofiber     Dressing Status  Old drainage    Dressing Change Frequency  PRN    Site / Wound Assessment  Red    % Wound base Red or Granulating  95%    % Wound base Yellow/Fibrinous Exudate  0%    % Wound base Other/Granulation Tissue (Comment)  5%   callous perimeter   Peri-wound Assessment  Erythema (non-blanchable);Maceration    Wound Length (cm)  1.6 cm   1.6   Wound Width (cm)  1.1 cm   1.3   Wound Depth (cm)  0.4 cm   0.4   Wound Volume (cm^3)  0.7 cm^3    Wound Surface Area (cm^2)  1.76 cm^2    Margins  Epibole (rolled edges)   75%   Closure  None    Drainage Amount  Moderate    Drainage Description  Serous    Treatment  Cleansed;Debridement (Selective);Other (Comment)   low level laser therapy (5 joules, conitnuous, 4 times, 7")   Pulsed lavage therapy - wound location  wound bed     Pulsed Lavage with Suction (psi)  4 psi    Pulsed Lavage with Suction - Normal Saline Used  1000 mL    Pulsed Lavage Tip  Tip with splash shield  Selective Debridement - Location  slough from wound bed, epiboled edges, callous surrounding wound    Selective Debridement - Tools Used  Forceps;Scalpel    Selective Debridement - Tissue Removed  callous, slough devitalized tissue    Wound Therapy - Clinical Statement  Patient's wound continues to present with callous around periwound. Wound has reduced slightly in size since last session. This date continued with selective debridement to remove slough and address epiboled margins. Imitated treatment with low level laser therapy to wound bed and periwound. Performed 1 pass at 12 o'clock, 3 o'clock, 6 o'clock, and 9 o'clock. Will continued with intervention for 2 week trial to determine any improvements in wound healing. She will continue to benefit from skilled wound care to promote healing and may benefit from laser treatment to promote healing as well.    Wound Therapy - Functional Problem List  difficulty walking; donning boot     Factors Delaying/Impairing  Wound Healing  Altered sensation;Diabetes Mellitus;Multiple medical problems    Hydrotherapy Plan  Debridement;Dressing change;Patient/family education    Wound Therapy - Frequency  2X / week   extend for 6 more weeks for a total of 15 weeks.    Wound Therapy - Current Recommendations  PT    Wound Plan  Measure on Monday. Continue topical agents as appropriate to maintain healing environment. Continue pulsed lavage again.     Dressing   Silver hydrofiber, calcium alginate, ABD, 4x4, Kerlix and netting #5           PT Short Term Goals - 11/03/18 1356      PT SHORT TERM GOAL #1   Title  PT to verbalize the signs and sx of cellulitis and to contact MD ASAP     Time  1    Period  Weeks    Status  Achieved      PT SHORT TERM GOAL #2   Title  PT callous to be removed from wound to allow decrease pressure to allow wound to heal     Time  3    Period  Weeks    Status  Achieved      PT SHORT TERM GOAL #3   Title  wound drainage to decrease to scant to min to allow pt to be able to don socks without soiling     Time  3    Period  Weeks    Status  On-going      PT SHORT TERM GOAL #4   Title  epiboled edges to be gone to allow wound to begin healing     Time  3    Period  Weeks    Status  Partially Met        PT Long Term Goals - 11/03/18 1356      PT LONG TERM GOAL #1   Title  PT wound to have no depth to allow pt to be comfortable with self care     Time  6    Period  Weeks    Status  On-going      PT LONG TERM GOAL #2   Title  PT wound length and width to have decreased at least 1 cm to demonstrate a healing environment for pt to begin self care.     Time  6    Period  Weeks    Status  On-going      PT LONG TERM GOAL #3   Title  PT edema to have decreased to facilitate  donning of boot.     Time  6    Period  Weeks    Status  Achieved         Plan - 11/21/18 1238    Clinical Impression Statement  see above    Rehab Potential  Fair    PT Frequency  2x / week     PT Duration  8 weeks    PT Treatment/Interventions  ADLs/Self Care Home Management;Patient/family education;Other (comment)   debridement/dressing change, low level laser therapy   PT Next Visit Plan  see above    Consulted and Agree with Plan of Care  Patient       Patient will benefit from skilled therapeutic intervention in order to improve the following deficits and impairments:  Pain, Decreased skin integrity, Difficulty walking, Increased edema  Visit Diagnosis: Localized edema  Open wound of right foot with complication, sequela  Pain in right foot     Problem List There are no active problems to display for this patient.   Kipp Brood, PT, DPT, [facility] Physical Therapist with [facility]  11/21/2018 12:41 PM    [place], [place], [address] Phone: [phone]   Fax:  [phone]  Name: Suzanne Tran MRN: [id number] Date of Birth: 1956-06-26

## 2018-11-24 ENCOUNTER — Ambulatory Visit (HOSPITAL_COMMUNITY): Payer: Medicaid Other

## 2018-11-24 ENCOUNTER — Encounter (HOSPITAL_COMMUNITY): Payer: Self-pay

## 2018-11-24 ENCOUNTER — Other Ambulatory Visit: Payer: Self-pay

## 2018-11-24 DIAGNOSIS — S91301S Unspecified open wound, right foot, sequela: Secondary | ICD-10-CM

## 2018-11-24 DIAGNOSIS — R600 Localized edema: Secondary | ICD-10-CM | POA: Diagnosis not present

## 2018-11-24 DIAGNOSIS — M79671 Pain in right foot: Secondary | ICD-10-CM

## 2018-11-24 NOTE — Therapy (Signed)
Suzanne Tran, [place], [address] Phone: [phone]   Fax:  [phone]  Wound Care Therapy  Patient Details  Name: Suzanne Tran MRN: [id number] Date of Birth: October 20, 1955 No data recorded  Encounter Date: 11/24/2018  PT End of Session - 11/24/18 1436    Visit Number  37    Number of Visits  42    Date for PT Re-Evaluation  12/01/18    Authorization Type  no insurance, self pay    Authorization Time Period  07/18/18-08/28/18; second: 08/21/18-09/25/18; third: 09/25/18-11/06/18; 11/03/18-12/05/18; 11/21/18-01/16/19    PT Start Time  1120    PT Stop Time  1207    PT Time Calculation (min)  47 min    Activity Tolerance  Patient tolerated treatment well;No increased pain    Behavior During Therapy  WFL for tasks assessed/performed       Past Medical History:  Diagnosis Date  . Diabetes mellitus without complication ([place])   . Hypertension   . Renal disorder     Past Surgical History:  Procedure Laterality Date  . ANKLE SURGERY    . KNEE SURGERY    . REPLACEMENT TOTAL KNEE      There were no vitals filed for this visit.     Wound Therapy - 11/24/18 1417    Subjective  Patient arrives reporting she is regaining some of her vision and she is very excited about that. She reports she was unable to get in touch with her PCP about a referral to a podiatrist.     Patient and Family Stated Goals  wound to heal     Date of Onset  06/20/18    Prior Treatments  self care     Pain Scale  0-10    Pain Score  0-No pain    Evaluation and Treatment Procedures Explained to Patient/Family  Yes    Evaluation and Treatment Procedures  agreed to    Wound Properties Date First Assessed: 07/18/18 Time First Assessed: 0827 Wound Type: Diabetic ulcer Location: Foot Location Orientation: Right Wound Description (Comments): plantar aspect  Present on Admission: Yes   Dressing Type  Gauze (Comment);Alginate;Abdominal pads;Silver hydrofiber    Dressing Changed  Changed    Dressing Status  Old drainage    Dressing Change Frequency  PRN    Site / Wound Assessment  Red    % Wound base Red or Granulating  95%    % Wound base Yellow/Fibrinous Exudate  0%    % Wound base Other/Granulation Tissue (Comment)  5%   callous perimeter   Peri-wound Assessment  Erythema (non-blanchable);Maceration    Wound Length (cm)  1.6 cm    Wound Width (cm)  1.1 cm    Wound Depth (cm)  0.5 cm    Wound Volume (cm^3)  0.88 cm^3    Wound Surface Area (cm^2)  1.76 cm^2    Margins  Epibole (rolled edges)   75%   Closure  None    Drainage Amount  Moderate    Drainage Description  Serous    Treatment  Cleansed;Debridement (Selective)   low level laser therapy (5 joules, conitnuous, 4 times, 7")   Pulsed lavage therapy - wound location  --    Pulsed Lavage with Suction (psi)  --    Pulsed Lavage with Suction - Normal Saline Used  --    Pulsed Lavage Tip  --    Selective Debridement - Location  slough from wound bed,  epiboled edges, callous surrounding wound    Selective Debridement - Tools Used  Forceps;Scalpel    Selective Debridement - Tissue Removed  callous, slough devitalized tissue    Wound Therapy - Clinical Statement  Patient's wound presents with callous around periwound and epiboled margins. Wound appears to be attaching to margin on medial boarder more but continues to be unattached at lateral and superior boarders. Continued with debridement of edges to address epibole and remove slough from wound bed. Low level laser therapy performed again this session and will plan to continue. She will continue to benefit from skilled wound care to promote healing and may benefit from laser treatment to promote healing as well.    Wound Therapy - Functional Problem List  difficulty walking; donning boot     Factors Delaying/Impairing Wound Healing  Altered sensation;Diabetes Mellitus;Multiple medical problems    Hydrotherapy Plan  Debridement;Dressing  change;Patient/family education    Wound Therapy - Frequency  2X / week   extend for 6 more weeks for a total of 15 weeks.    Wound Therapy - Current Recommendations  PT    Wound Plan  Measure on Monday. Continue topical agents as appropriate to maintain healing environment. Continue pulsed lavage again.     Dressing   Silver hydrofiber, calcium alginate, ABD, 4x4, Kerlix and netting #5         PT Short Term Goals - 11/03/18 1356      PT SHORT TERM GOAL #1   Title  PT to verbalize the signs and sx of cellulitis and to contact MD ASAP     Time  1    Period  Weeks    Status  Achieved      PT SHORT TERM GOAL #2   Title  PT callous to be removed from wound to allow decrease pressure to allow wound to heal     Time  3    Period  Weeks    Status  Achieved      PT SHORT TERM GOAL #3   Title  wound drainage to decrease to scant to min to allow pt to be able to don socks without soiling     Time  3    Period  Weeks    Status  On-going      PT SHORT TERM GOAL #4   Title  epiboled edges to be gone to allow wound to begin healing     Time  3    Period  Weeks    Status  Partially Met        PT Long Term Goals - 11/03/18 1356      PT LONG TERM GOAL #1   Title  PT wound to have no depth to allow pt to be comfortable with self care     Time  6    Period  Weeks    Status  On-going      PT LONG TERM GOAL #2   Title  PT wound length and width to have decreased at least 1 cm to demonstrate a healing environment for pt to begin self care.     Time  6    Period  Weeks    Status  On-going      PT LONG TERM GOAL #3   Title  PT edema to have decreased to facilitate donning of boot.     Time  6    Period  Weeks    Status  Achieved  Plan - 11/24/18 1438    Clinical Impression Statement  see above    Rehab Potential  Fair    PT Frequency  2x / week    PT Duration  8 weeks    PT Treatment/Interventions  ADLs/Self Care Home Management;Patient/family education;Other  (comment)   debridement/dressing change, low level laser therapy   PT Next Visit Plan  see above    Consulted and Agree with Plan of Care  Patient       Patient will benefit from skilled therapeutic intervention in order to improve the following deficits and impairments:  Pain, Decreased skin integrity, Difficulty walking, Increased edema  Visit Diagnosis: Localized edema  Open wound of right foot with complication, sequela  Pain in right foot     Problem List There are no active problems to display for this patient.   Kipp Brood, PT, DPT, [facility] Physical Therapist with [facility]  11/24/2018 2:39 PM    [place], [place], [address] Phone: [phone]   Fax:  [phone]  Name: Soundra Zinna MRN: [id number] Date of Birth: [birth year]

## 2018-11-28 ENCOUNTER — Ambulatory Visit (HOSPITAL_COMMUNITY): Payer: Medicaid Other | Admitting: Physical Therapy

## 2018-12-01 ENCOUNTER — Other Ambulatory Visit: Payer: Self-pay

## 2018-12-01 ENCOUNTER — Encounter (HOSPITAL_COMMUNITY): Payer: Self-pay | Admitting: Physical Therapy

## 2018-12-01 ENCOUNTER — Ambulatory Visit (HOSPITAL_COMMUNITY): Payer: Medicaid Other | Admitting: Physical Therapy

## 2018-12-01 DIAGNOSIS — S91301S Unspecified open wound, right foot, sequela: Secondary | ICD-10-CM

## 2018-12-01 DIAGNOSIS — R600 Localized edema: Secondary | ICD-10-CM

## 2018-12-01 NOTE — Therapy (Signed)
[place], [place], [address] Phone: [phone]   Fax:  [phone]  Wound Care Therapy  Patient Details  Name: Suzanne Tran MRN: [id number] Date of Birth: 05/22/1956 No data recorded  Encounter Date: 12/01/2018  PT End of Session - 12/01/18 1318    Visit Number  38    Number of Visits  42    Date for PT Re-Evaluation  12/01/18    Authorization Type  no insurance, self pay    Authorization Time Period  07/18/18-08/28/18; second: 08/21/18-09/25/18; third: 09/25/18-11/06/18; 11/03/18-12/05/18; 11/21/18-01/16/19    PT Start Time  0900    PT Stop Time  0940    PT Time Calculation (min)  40 min    Activity Tolerance  Patient tolerated treatment well;No increased pain    Behavior During Therapy  WFL for tasks assessed/performed       Past Medical History:  Diagnosis Date  . Diabetes mellitus without complication ([facility])   . Hypertension   . Renal disorder     Past Surgical History:  Procedure Laterality Date  . ANKLE SURGERY    . KNEE SURGERY    . REPLACEMENT TOTAL KNEE      There were no vitals filed for this visit.              Wound Therapy - 12/01/18 1307    Subjective  pt states she missed her last appt because she was not aware she had one    Patient and Family Stated Goals  wound to heal     Date of Onset  06/20/18    Prior Treatments  self care     Pain Scale  0-10    Pain Score  0-No pain    Evaluation and Treatment Procedures Explained to Patient/Family  Yes    Evaluation and Treatment Procedures  agreed to    Wound Properties Date First Assessed: 07/18/18 Time First Assessed: 0827 Wound Type: Diabetic ulcer Location: Foot Location Orientation: Right Wound Description (Comments): plantar aspect  Present on Admission: Yes   Dressing Type  Gauze (Comment);Alginate;Abdominal pads;Silver hydrofiber    Dressing Changed  Changed    Dressing Status  Old drainage    Dressing Change Frequency  PRN     Site / Wound Assessment  Red    % Wound base Red or Granulating  95%    % Wound base Yellow/Fibrinous Exudate  0%    % Wound base Other/Granulation Tissue (Comment)  5%   callous perimeter   Peri-wound Assessment  Erythema (non-blanchable);Maceration    Wound Length (cm)  1.6 cm    Wound Width (cm)  1.1 cm    Wound Depth (cm)  0.5 cm    Wound Volume (cm^3)  0.88 cm^3    Wound Surface Area (cm^2)  1.76 cm^2    Margins  Epibole (rolled edges)   75%   Closure  None    Drainage Amount  Moderate    Drainage Description  Serous    Treatment  Cleansed;Debridement (Selective)    Selective Debridement - Location  slough from wound bed, epiboled edges, callous surrounding wound    Selective Debridement - Tools Used  Forceps;Scalpel    Selective Debridement - Tissue Removed  callous, slough devitalized tissue    Wound Therapy - Clinical Statement  large build up of callous perimeter of wound scraped off 80% with scapel blade.  Also debrided epibole from edges.  Measured without any change.  Continued  with laser treatment and dressing with silverhydro packed into wound.  Order faxed to MD for orthotic consult.  Will provide pateint with list of providers once received.      Wound Therapy - Functional Problem List  difficulty walking; donning boot     Factors Delaying/Impairing Wound Healing  Altered sensation;Diabetes Mellitus;Multiple medical problems    Hydrotherapy Plan  Debridement;Dressing change;Patient/family education    Wound Therapy - Frequency  2X / week   extend for 6 more weeks for a total of 15 weeks.    Wound Therapy - Current Recommendations  PT    Wound Plan  continue with appropriate woundcare; resume PL if needed.  continue sharps debridment.  F/U on orthotic consult.    Dressing   Silver hydrofiber, calcium alginate, ABD, 4x4, Kerlix and netting #5    Modality  laser, chronic continuous to increase circulation 2 places overlapping 20% 75Joules 1:45" each spot.                  PT Short Term Goals - 11/03/18 1356      PT SHORT TERM GOAL #1   Title  PT to verbalize the signs and sx of cellulitis and to contact MD ASAP     Time  1    Period  Weeks    Status  Achieved      PT SHORT TERM GOAL #2   Title  PT callous to be removed from wound to allow decrease pressure to allow wound to heal     Time  3    Period  Weeks    Status  Achieved      PT SHORT TERM GOAL #3   Title  wound drainage to decrease to scant to min to allow pt to be able to don socks without soiling     Time  3    Period  Weeks    Status  On-going      PT SHORT TERM GOAL #4   Title  epiboled edges to be gone to allow wound to begin healing     Time  3    Period  Weeks    Status  Partially Met        PT Long Term Goals - 11/03/18 1356      PT LONG TERM GOAL #1   Title  PT wound to have no depth to allow pt to be comfortable with self care     Time  6    Period  Weeks    Status  On-going      PT LONG TERM GOAL #2   Title  PT wound length and width to have decreased at least 1 cm to demonstrate a healing environment for pt to begin self care.     Time  6    Period  Weeks    Status  On-going      PT LONG TERM GOAL #3   Title  PT edema to have decreased to facilitate donning of boot.     Time  6    Period  Weeks    Status  Achieved              Patient will benefit from skilled therapeutic intervention in order to improve the following deficits and impairments:     Visit Diagnosis: Localized edema  Open wound of right foot with complication, sequela     Problem List There are no active problems to display for this patient.  [name], PTA/CLT [phone]  [name] 12/01/2018, 1:23 PM  [facility] [address] [place], [place], [address] Phone: [phone]   Fax:  [phone]  Name: Suzanne Tran MRN: [id number] Date of Birth: [birth year]

## 2018-12-05 ENCOUNTER — Other Ambulatory Visit: Payer: Self-pay

## 2018-12-05 ENCOUNTER — Encounter (HOSPITAL_COMMUNITY): Payer: Self-pay | Admitting: Physical Therapy

## 2018-12-05 ENCOUNTER — Emergency Department (HOSPITAL_COMMUNITY)
Admission: EM | Admit: 2018-12-05 | Discharge: 2018-12-06 | Disposition: A | Discharge status: Home / Self Care | Payer: Medicaid Other | Attending: Emergency Medicine | Admitting: Emergency Medicine

## 2018-12-05 ENCOUNTER — Encounter (HOSPITAL_COMMUNITY): Payer: Self-pay | Admitting: Emergency Medicine

## 2018-12-05 ENCOUNTER — Emergency Department (HOSPITAL_COMMUNITY): Payer: Medicaid Other

## 2018-12-05 ENCOUNTER — Ambulatory Visit (HOSPITAL_COMMUNITY): Payer: Medicaid Other | Admitting: Physical Therapy

## 2018-12-05 DIAGNOSIS — H538 Other visual disturbances: Secondary | ICD-10-CM | POA: Diagnosis not present

## 2018-12-05 DIAGNOSIS — R739 Hyperglycemia, unspecified: Secondary | ICD-10-CM

## 2018-12-05 DIAGNOSIS — I6789 Other cerebrovascular disease: Secondary | ICD-10-CM | POA: Insufficient documentation

## 2018-12-05 DIAGNOSIS — Z794 Long term (current) use of insulin: Secondary | ICD-10-CM | POA: Diagnosis not present

## 2018-12-05 DIAGNOSIS — I679 Cerebrovascular disease, unspecified: Secondary | ICD-10-CM

## 2018-12-05 DIAGNOSIS — I10 Essential (primary) hypertension: Secondary | ICD-10-CM | POA: Diagnosis not present

## 2018-12-05 DIAGNOSIS — E1165 Type 2 diabetes mellitus with hyperglycemia: Secondary | ICD-10-CM | POA: Diagnosis not present

## 2018-12-05 DIAGNOSIS — H539 Unspecified visual disturbance: Secondary | ICD-10-CM

## 2018-12-05 DIAGNOSIS — R600 Localized edema: Secondary | ICD-10-CM | POA: Diagnosis not present

## 2018-12-05 DIAGNOSIS — S91301S Unspecified open wound, right foot, sequela: Secondary | ICD-10-CM

## 2018-12-05 LAB — CBC WITH DIFFERENTIAL/PLATELET
Abs Immature Granulocytes: 0.02 10*3/uL (ref 0.00–0.07)
Basophils Absolute: 0.1 10*3/uL (ref 0.0–0.1)
Basophils Relative: 1 %
Eosinophils Absolute: 0.2 10*3/uL (ref 0.0–0.5)
Eosinophils Relative: 3 %
HCT: 36 % (ref 36.0–46.0)
Hemoglobin: 11 g/dL — ABNORMAL LOW (ref 12.0–15.0)
Immature Granulocytes: 0 %
Lymphocytes Relative: 39 %
Lymphs Abs: 2.7 10*3/uL (ref 0.7–4.0)
MCH: 26.7 pg (ref 26.0–34.0)
MCHC: 30.6 g/dL (ref 30.0–36.0)
MCV: 87.4 fL (ref 80.0–100.0)
Monocytes Absolute: 0.5 10*3/uL (ref 0.1–1.0)
Monocytes Relative: 7 %
Neutro Abs: 3.4 10*3/uL (ref 1.7–7.7)
Neutrophils Relative %: 50 %
Platelets: 322 10*3/uL (ref 150–400)
RBC: 4.12 MIL/uL (ref 3.87–5.11)
RDW: 13.2 % (ref 11.5–15.5)
WBC: 6.8 10*3/uL (ref 4.0–10.5)
nRBC: 0 % (ref 0.0–0.2)

## 2018-12-05 LAB — BASIC METABOLIC PANEL
Anion gap: 8 (ref 5–15)
BUN: 28 mg/dL — ABNORMAL HIGH (ref 8–23)
CO2: 26 mmol/L (ref 22–32)
Calcium: 9.1 mg/dL (ref 8.9–10.3)
Chloride: 100 mmol/L (ref 98–111)
Creatinine, Ser: 1.06 mg/dL — ABNORMAL HIGH (ref 0.44–1.00)
GFR calc Af Amer: >60 mL/min (ref >60)
GFR calc non Af Amer: 55 mL/min — ABNORMAL LOW (ref >60)
Glucose, Bld: 330 mg/dL — ABNORMAL HIGH (ref 70–99)
Potassium: 4.1 mmol/L (ref 3.5–5.1)
Sodium: 134 mmol/L — ABNORMAL LOW (ref 135–145)

## 2018-12-05 LAB — TROPONIN I: Troponin I: <0.03 ng/mL (ref <0.03)

## 2018-12-05 MED ORDER — SODIUM CHLORIDE 0.9 % IV BOLUS (SEPSIS)
1000.0000 mL | Freq: Once | INTRAVENOUS | Status: AC
  Administered 2018-12-05: 1000 mL via INTRAVENOUS

## 2018-12-05 MED ORDER — SODIUM CHLORIDE 0.9 % IV SOLN: 1000.0000 mL | INTRAVENOUS | Status: DC

## 2018-12-05 NOTE — ED Notes (Signed)
Patient on 12 lead at this time . EKG done 

## 2018-12-05 NOTE — ED Provider Notes (Signed)
[facility] EMERGENCY DEPARTMENT Provider Note   CSN: [id number] Arrival date & time: 12/05/18  1823    History   Chief Complaint Chief Complaint  Patient presents with   Eye Problem    past  stroke    HPI Suzanne Tran is a 63 y.o. female.     Patient is a 63 year old female who presents to the emergency department with a complaint of change in her vision.  The patient states that approximately a month ago she had a bad headache behind her right eye.  She has been having problems with her vision in the right eye since that time.  She describes the problem as a darkness in her vision from the lower portion of her line of vision up to nearly the middle.  She was seen on May 1 by an ophthalmologist.  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  She was told that she may get some of her vision back and she may not.  She says that after a couple of days the vision did seem to get a little better, and it continued to get better for approximately 3 weeks until yesterday, may 21.  At that time her vision was hazy and seem to be getting worse.  She had been previously seen by Dr. Dione Booze in [place] She attempted to reach Dr. Alden Hipp or the on-call physician.  She was finally able to reach the on-call physician this evening, and he advised her to come to the emergency department to be worked up for possible cerebrovascular accident.  He is going to see the patient on tomorrow in the office if the patient is cleared tonight.  There is been no injury reported.  No changes in medications have been reported.  No changes of the left eye.  No facial weakness.  No difficulty with swallowing or speaking, no problem with using the extremities, no loss of bowel or bladder function.  No difficulty with walking.  The history is provided by the patient.    Past Medical History:  Diagnosis Date   Diabetes mellitus without complication ([facility])    Hypertension    Renal  disorder     There are no active problems to display for this patient.   Past Surgical History:  Procedure Laterality Date   ANKLE SURGERY     KNEE SURGERY     REPLACEMENT TOTAL KNEE       OB History   No obstetric history on file.      Home Medications    Prior to Admission medications   Medication Sig Start Date End Date Taking? Authorizing Provider  insulin regular (NOVOLIN R) 100 units/mL injection Inject into the skin 3 (three) times daily before meals. Sliding scale   Yes [provider]  losartan-hydrochlorothiazide (HYZAAR) 50-12.5 MG tablet Take 1 tablet by mouth daily.  08/21/17  Yes [provider]  metFORMIN (GLUCOPHAGE) 500 MG tablet Take 500 mg by mouth 2 (two) times daily with a meal.   Yes [provider]    Family History History reviewed. No pertinent family history.  Social History Social History   Tobacco Use   Smoking status: Never Smoker   Smokeless tobacco: Never Used  Substance Use Topics   Alcohol use: Never    Frequency: Never   Drug use: Never     Allergies   Codeine   Review of Systems Review of Systems  Constitutional: Negative for activity change.  All ROS Neg except as noted in HPI  HENT: Negative for nosebleeds.   Eyes: Positive for visual disturbance. Negative for photophobia and discharge.  Respiratory: Negative for cough, shortness of breath and wheezing.   Cardiovascular: Negative for chest pain and palpitations.  Gastrointestinal: Negative for abdominal pain and blood in stool.  Genitourinary: Negative for dysuria, frequency and hematuria.  Musculoskeletal: Negative for arthralgias, back pain and neck pain.  Skin: Negative.   Neurological: Positive for headaches. Negative for dizziness, seizures and speech difficulty.  Psychiatric/Behavioral: Negative for confusion and hallucinations.     Physical Exam Updated Vital Signs BP (!) 177/78    Pulse 92    Temp 99.2 F (37.3 C)  (Oral)    Resp 20    Ht 5\' 10"  (1.778 m)    Wt 109.2 kg    SpO2 98%    BMI 34.54 kg/m   Physical Exam Vitals signs and nursing note reviewed.  Constitutional:      Appearance: She is well-developed. She is not toxic-appearing.  HENT:     Head: Normocephalic.     Right Ear: Tympanic membrane and external ear normal.     Left Ear: Tympanic membrane and external ear normal.  Eyes:     General: Lids are normal.     Pupils: Pupils are equal, round, and reactive to light.  Neck:     Musculoskeletal: Normal range of motion and neck supple.     Vascular: No carotid bruit.  Cardiovascular:     Rate and Rhythm: Normal rate and regular rhythm.     Pulses: Normal pulses.     Heart sounds: Normal heart sounds.  Pulmonary:     Effort: No respiratory distress.     Breath sounds: Normal breath sounds.  Abdominal:     General: Bowel sounds are normal.     Palpations: Abdomen is soft.     Tenderness: There is no abdominal tenderness. There is no guarding.  Musculoskeletal: Normal range of motion.  Lymphadenopathy:     Head:     Right side of head: No submandibular adenopathy.     Left side of head: No submandibular adenopathy.     Cervical: No cervical adenopathy.  Skin:    General: Skin is warm and dry.  Neurological:     Mental Status: She is alert and oriented to person, place, and time.     Cranial Nerves: No cranial nerve deficit.     Sensory: No sensory deficit.     Comments: Extraocular movements are intact.  There is no acute abnormalities on the funduscopic examination.  The pupils are equal.  Patient has good sensation to light touch.  There is no facial asymmetry noted.  Patient able to raise the soft palate without problem.  No weakness noted of the sternocleidomastoid or trapezius area.  The tongue is midline, and the patient is able to move the tongue from side to side without problem.  The gait is steady.  There is no sensory loss or motor loss of the upper or lower extremities.    Psychiatric:        Speech: Speech normal.      ED Treatments / Results  Labs (all labs ordered are listed, but only abnormal results are displayed) Labs Reviewed  CBC WITH DIFFERENTIAL/PLATELET - Abnormal; Notable for the following components:      Result Value   Hemoglobin 11.0 (*)    All other components within normal limits  BASIC METABOLIC PANEL -  Abnormal; Notable for the following components:   Sodium 134 (*)    Glucose, Bld 330 (*)    BUN 28 (*)    Creatinine, Ser 1.06 (*)    GFR calc non Af Amer 55 (*)    All other components within normal limits  TROPONIN I    EKG EKG Interpretation  Date/Time:  Friday Dec 05 2018 19:48:58 EDT Ventricular Rate:  90 PR Interval:    QRS Duration: 95 QT Interval:  391 QTC Calculation: 479 R Axis:   -38 Text Interpretation:  Sinus rhythm Left axis deviation Abnormal R-wave progression, late transition Confirmed by Donnetta Hutchingook, Brian ([id number]) on 12/05/2018 8:45:16 PM   Radiology No results found.  Procedures Procedures (including critical care time)  Medications Ordered in ED Medications - No data to display   Initial Impression / Assessment and Plan / ED Course  I have reviewed the triage vital signs and the nursing notes.  Pertinent labs & imaging results that were available during my care of the patient were reviewed by me and considered in my medical decision making (see chart for details).          Final Clinical Impressions(s) / ED Diagnoses MDM  Vital signs reviewed.  Pulse oximetry is 98% on room air.  Within normal limits by my interpretation.  Patient has been having a problem with vision changes over the past month.  The patient was seen by ophthalmology on May 1.  The patient states that she has been getting some better, but this changed yesterday when the patient's vision began to be very hazy.  She reached the outpatient ophthalmologist on call, and he told her to come to the emergency department for evaluation  of possible stroke.  No gross neurologic deficit appreciated on examination.  Complete blood count are within normal limits. Basic metabolic shows the glucose to be elevated at 330.  Patient receiving a bolus of IV fluids.  Will repeat CBG after the bolus.  The BUN is elevated at 26, and the creatinine is slightly elevated at 1.06.  The anion gap is normal at 8.  The troponin is negative for acute event at less than 0.03.  The electrocardiogram shows a sinus rhythm with a rate of 90 bpm.  There is a left axis deviation.  And there is some poor R wave progression with late transition noted.  No acute STEMI.  There is no life-threatening arrhythmia appreciated on the EKG.  The CT head scan shows an age-indeterminate small vessel infarcts of the right caudate head and the left thalamus.  Radiology is suggesting that an MRI may be helpful for better temporal characterization.  It is not expected however that these lesions would contribute to the visual field defects.  There is no intracranial mass or hemorrhage appreciated.  Glucose elevated at 330. IV fluids given. CBG improved to 135.  Pt awake and alert. Pt texting on phone without problem. No neurologic changes appreciated. Pt will see the ophthalmologist in AM.   Final diagnoses:  Visual disturbance  Small vessel disease, cerebrovascular  Hyperglycemia    ED Discharge Orders    None       Ivery QualeBryant, Joei Frangos, PA-C 12/06/18 1000    Donnetta Hutchingook, Brian, MD 12/10/18 [phone]

## 2018-12-05 NOTE — ED Notes (Signed)
Patient transported to CT 

## 2018-12-05 NOTE — Therapy (Signed)
[place], [place], [address] Phone: [phone]   Fax:  [phone]  Wound Care Therapy  Patient Details  Name: Suzanne Tran MRN: [id number] Date of Birth: Jul 09, 1956 No data recorded  Encounter Date: 12/05/2018  PT End of Session - 12/05/18 1211    Visit Number  39    Number of Visits  42    Date for PT Re-Evaluation  12/01/18    Authorization Type  no insurance, self pay    Authorization Time Period  07/18/18-08/28/18; second: 08/21/18-09/25/18; third: 09/25/18-11/06/18; 11/03/18-12/05/18; 11/21/18-01/16/19    PT Start Time  0900    PT Stop Time  0945    PT Time Calculation (min)  45 min    Activity Tolerance  Patient tolerated treatment well;No increased pain    Behavior During Therapy  WFL for tasks assessed/performed       Past Medical History:  Diagnosis Date  . Diabetes mellitus without complication ([facility])   . Hypertension   . Renal disorder     Past Surgical History:  Procedure Laterality Date  . ANKLE SURGERY    . KNEE SURGERY    . REPLACEMENT TOTAL KNEE      There were no vitals filed for this visit.              Wound Therapy - 12/05/18 1159    Subjective  pt states her eye is no better.  STates she had about the sam amount of drainage.     Patient and Family Stated Goals  wound to heal     Date of Onset  06/20/18    Prior Treatments  self care     Evaluation and Treatment Procedures Explained to Patient/Family  Yes    Evaluation and Treatment Procedures  agreed to    Wound Properties Date First Assessed: 07/18/18 Time First Assessed: 0827 Wound Type: Diabetic ulcer Location: Foot Location Orientation: Right Wound Description (Comments): plantar aspect  Present on Admission: Yes   Dressing Type  Gauze (Comment);Alginate;Abdominal pads;Silver hydrofiber    Dressing Changed  Changed    Dressing Status  Old drainage    Dressing Change Frequency  PRN    Site / Wound Assessment  Red    %  Wound base Red or Granulating  95%    % Wound base Yellow/Fibrinous Exudate  0%    % Wound base Other/Granulation Tissue (Comment)  5%   callous perimeter   Peri-wound Assessment  Erythema (non-blanchable);Maceration    Wound Length (cm)  1.5 cm   was 1.6 on 5/18   Wound Width (cm)  0.7 cm   was 1.1 cm on 5/18   Wound Depth (cm)  0.6 cm   was 0.5 cm on 5/18   Wound Volume (cm^3)  0.63 cm^3    Wound Surface Area (cm^2)  1.05 cm^2    Margins  Attached edges (approximated)   75%   Closure  None    Drainage Amount  Moderate    Drainage Description  Serosanguineous    Treatment  Cleansed;Debridement (Selective);Other (Comment)   laser treatment   Selective Debridement - Location  slough from wound bed, epiboled edges, callous surrounding wound    Selective Debridement - Tools Used  Forceps;Scalpel    Selective Debridement - Tissue Removed  callous, slough devitalized tissue    Wound Therapy - Clinical Statement  Much improved with increased approximation noted following removal of thick callous build up.  Continued with laser and  silver hydrofiber dressing.  More sanginous drainage today due to removal of tissue last session.   Pt given signed orthotic consult and information sheet on contact names/numbers.    Wound Therapy - Functional Problem List  difficulty walking; donning boot     Factors Delaying/Impairing Wound Healing  Altered sensation;Diabetes Mellitus;Multiple medical problems    Hydrotherapy Plan  Debridement;Dressing change;Patient/family education    Wound Therapy - Frequency  2X / week   extend for 6 more weeks for a total of 15 weeks.    Wound Therapy - Current Recommendations  PT    Wound Plan  continue with appropriate woundcare; resume PL if needed.  continue sharps debridment.  F/U on orthotic consult.    Dressing   Silver hydrofiber, calcium alginate, ABD, 4x4, Kerlix and netting #5    Modality  laser, chronic continuous to increase circulation 2 places overlapping  20% 75Joules 1:45" each spot.               PT Education - 12/05/18 1209    Education Details  given signed order for orthotic consult and list of providers to choose from.  Instructed to call and make appt.    Person(s) Educated  Patient    Methods  Explanation;Handout    Comprehension  Verbalized understanding       PT Short Term Goals - 11/03/18 1356      PT SHORT TERM GOAL #1   Title  PT to verbalize the signs and sx of cellulitis and to contact MD ASAP     Time  1    Period  Weeks    Status  Achieved      PT SHORT TERM GOAL #2   Title  PT callous to be removed from wound to allow decrease pressure to allow wound to heal     Time  3    Period  Weeks    Status  Achieved      PT SHORT TERM GOAL #3   Title  wound drainage to decrease to scant to min to allow pt to be able to don socks without soiling     Time  3    Period  Weeks    Status  On-going      PT SHORT TERM GOAL #4   Title  epiboled edges to be gone to allow wound to begin healing     Time  3    Period  Weeks    Status  Partially Met        PT Long Term Goals - 11/03/18 1356      PT LONG TERM GOAL #1   Title  PT wound to have no depth to allow pt to be comfortable with self care     Time  6    Period  Weeks    Status  On-going      PT LONG TERM GOAL #2   Title  PT wound length and width to have decreased at least 1 cm to demonstrate a healing environment for pt to begin self care.     Time  6    Period  Weeks    Status  On-going      PT LONG TERM GOAL #3   Title  PT edema to have decreased to facilitate donning of boot.     Time  6    Period  Weeks    Status  Achieved  Patient will benefit from skilled therapeutic intervention in order to improve the following deficits and impairments:     Visit Diagnosis: Localized edema  Open wound of right foot with complication, sequela     Problem List There are no active problems to display for this patient.  [name], PTA/CLT [phone]  [name] 12/05/2018, 12:11 PM  [facility] [place], [place], [address] Phone: [phone]   Fax:  [phone]  Name: Jamey Remer MRN: [id number] Date of Birth: 07-May-1956

## 2018-12-05 NOTE — ED Triage Notes (Signed)
Patient has had eye problems x 1 month with decreased vision to the right eye from possible past stroke Patient was to see an eye specialist but was unable to get an appointment. Physician has sent patient her for continuing eye problems and wants patient to have a full stroke work up.

## 2018-12-06 LAB — CBG MONITORING, ED: Glucose-Capillary: 135 mg/dL — ABNORMAL HIGH (ref 70–99)

## 2018-12-06 NOTE — Discharge Instructions (Addendum)
Your blood pressure is slightly elevated.  Please have this rechecked soon.  Your blood sugar was elevated at 330.  This has improved after IV fluid.  Please monitor your glucose carefully.  The CT scan shows some small vessel disease present, but there is a question if this is from previous problem, or something new.  The area has of the disease are not in the areas that control your eyes.  Please see the ophthalmologist on tomorrow as scheduled.  Discuss with the ophthalmologist the need for the MRI to complete your work-up.  Please return to the emergency department immediately if any additional changes in your vision, any weakness of extremities, any difficulty with speaking or swallowing, or any problems with your balance.  Please use an 81 mg aspirin daily if you are not doing so already.

## 2018-12-09 ENCOUNTER — Ambulatory Visit (HOSPITAL_COMMUNITY): Payer: Medicaid Other

## 2018-12-10 ENCOUNTER — Ambulatory Visit (HOSPITAL_COMMUNITY): Payer: Medicaid Other

## 2018-12-12 ENCOUNTER — Other Ambulatory Visit: Payer: Self-pay

## 2018-12-12 ENCOUNTER — Ambulatory Visit (HOSPITAL_COMMUNITY): Payer: Medicaid Other

## 2018-12-12 ENCOUNTER — Encounter (HOSPITAL_COMMUNITY): Payer: Self-pay

## 2018-12-12 DIAGNOSIS — M79671 Pain in right foot: Secondary | ICD-10-CM

## 2018-12-12 DIAGNOSIS — R600 Localized edema: Secondary | ICD-10-CM

## 2018-12-12 DIAGNOSIS — S91301S Unspecified open wound, right foot, sequela: Secondary | ICD-10-CM

## 2018-12-12 NOTE — Therapy (Signed)
[place], [place], [address] Phone: [phone]   Fax:  [phone]  Wound Care Therapy  Patient Details  Name: Suzanne Tran MRN: [id number] Date of Birth: Jun 05, 1956 No data recorded  Encounter Date: 12/12/2018  PT End of Session - 12/12/18 1335    Visit Number  40    Number of Visits  36    Date for PT Re-Evaluation  01/16/19    Authorization Type  no insurance, self pay    Authorization Time Period  07/18/18-08/28/18; second: 08/21/18-09/25/18; third: 09/25/18-11/06/18; 11/03/18-12/05/18; 11/21/18-01/16/19    PT Start Time  1039    PT Stop Time  1115    PT Time Calculation (min)  36 min    Activity Tolerance  Patient tolerated treatment well;No increased pain    Behavior During Therapy  WFL for tasks assessed/performed       Past Medical History:  Diagnosis Date  . Diabetes mellitus without complication ([place])   . Hypertension   . Renal disorder     Past Surgical History:  Procedure Laterality Date  . ANKLE SURGERY    . KNEE SURGERY    . REPLACEMENT TOTAL KNEE      There were no vitals filed for this visit.     Wound Therapy - 12/12/18 1326    Subjective  Pt arrives after 1 week without wound care. Patient reports she was unable to make Tuesday appointment because she had an appointment at [facility] for an opthamology appointment.     Patient and Family Stated Goals  wound to heal     Date of Onset  06/20/18    Prior Treatments  self care     Pain Scale  0-10    Pain Score  0-No pain    Evaluation and Treatment Procedures Explained to Patient/Family  Yes    Evaluation and Treatment Procedures  agreed to    Wound Properties Date First Assessed: 07/18/18 Time First Assessed: 0827 Wound Type: Diabetic ulcer Location: Foot Location Orientation: Right Wound Description (Comments): plantar aspect  Present on Admission: Yes   Dressing Type  Gauze (Comment);Alginate;Abdominal pads;Silver hydrofiber    Dressing Changed  Changed    Dressing Status  Old drainage    Dressing Change Frequency  PRN    Site / Wound Assessment  Red    % Wound base Red or Granulating  95%    % Wound base Yellow/Fibrinous Exudate  0%    % Wound base Other/Granulation Tissue (Comment)  5%   callous perimeter   Peri-wound Assessment  Erythema (non-blanchable);Maceration    Wound Length (cm)  1.3 cm   0.5   Wound Width (cm)  0.8 cm   0.7   Wound Depth (cm)  0.7 cm   0.6   Wound Volume (cm^3)  0.73 cm^3    Wound Surface Area (cm^2)  1.04 cm^2    Margins  Attached edges (approximated)   75%   Closure  None    Drainage Amount  Moderate    Drainage Description  Serosanguineous    Treatment  Cleansed;Debridement (Selective)    Selective Debridement - Location  epiboled edges, callous surrounding wound    Selective Debridement - Tools Used  Forceps;Scalpel    Selective Debridement - Tissue Removed  callous, devitalized tissue    Wound Therapy - Clinical Statement  Pt's wound has reduced in length this date and continues to have increased approximation at distal margin. She continues to develop  thick callous build up around periwound requiring debreidment. Continued with low laser therapy and dressed with silver hydrofiber. Pt has not followed up with any office regarding orthotic referral as she has been managing appointment for her Rt eye impairments. She is planning to get her brothers help with the orthotic referral today. She will continue to benefit from skilled wound care to promote healing.    Wound Therapy - Functional Problem List  difficulty walking; donning boot     Factors Delaying/Impairing Wound Healing  Altered sensation;Diabetes Mellitus;Multiple medical problems    Hydrotherapy Plan  Debridement;Dressing change;Patient/family education    Wound Therapy - Frequency  2X / week   extend for 6 more weeks for a total of 15 weeks.    Wound Therapy - Current Recommendations  PT    Wound Plan  continue with  appropriate woundcare; resume PL if needed.  continue sharps debridment.  F/U on orthotic consult.    Dressing   Silver hydrofiber, calcium alginate, ABD, 4x4, Kerlix and netting #5    Modality  --    Modality  laser, continuous to increase circulation 4 places overlapping 25%, 5 Joules 0:13" each spot.           PT Short Term Goals - 11/03/18 1356      PT SHORT TERM GOAL #1   Title  PT to verbalize the signs and sx of cellulitis and to contact MD ASAP     Time  1    Period  Weeks    Status  Achieved      PT SHORT TERM GOAL #2   Title  PT callous to be removed from wound to allow decrease pressure to allow wound to heal     Time  3    Period  Weeks    Status  Achieved      PT SHORT TERM GOAL #3   Title  wound drainage to decrease to scant to min to allow pt to be able to don socks without soiling     Time  3    Period  Weeks    Status  On-going      PT SHORT TERM GOAL #4   Title  epiboled edges to be gone to allow wound to begin healing     Time  3    Period  Weeks    Status  Partially Met        PT Long Term Goals - 11/03/18 1356      PT LONG TERM GOAL #1   Title  PT wound to have no depth to allow pt to be comfortable with self care     Time  6    Period  Weeks    Status  On-going      PT LONG TERM GOAL #2   Title  PT wound length and width to have decreased at least 1 cm to demonstrate a healing environment for pt to begin self care.     Time  6    Period  Weeks    Status  On-going      PT LONG TERM GOAL #3   Title  PT edema to have decreased to facilitate donning of boot.     Time  6    Period  Weeks    Status  Achieved        Plan - 12/12/18 1337    Clinical Impression Statement  see above    Rehab Potential  Fair  PT Frequency  2x / week    PT Duration  8 weeks    PT Treatment/Interventions  ADLs/Self Care Home Management;Patient/family education;Other (comment)   debridement/dressing change, low level laser therapy   PT Next Visit Plan   see above    Consulted and Agree with Plan of Care  Patient       Patient will benefit from skilled therapeutic intervention in order to improve the following deficits and impairments:  Pain, Decreased skin integrity, Difficulty walking, Increased edema  Visit Diagnosis: Localized edema  Open wound of right foot with complication, sequela  Pain in right foot     Problem List There are no active problems to display for this patient.   Kipp Brood, PT, DPT, [facility] Physical Therapist with [facility]  12/12/2018 1:37 PM    [place] [address] [place], [place], [address] Phone: [phone]   Fax:  [phone]  Name: Suzanne Tran MRN: [id number] Date of Birth: 14-Jul-1956

## 2018-12-15 ENCOUNTER — Ambulatory Visit (HOSPITAL_COMMUNITY): Payer: Medicaid Other | Attending: Internal Medicine

## 2018-12-15 ENCOUNTER — Other Ambulatory Visit: Payer: Self-pay

## 2018-12-15 ENCOUNTER — Encounter (HOSPITAL_COMMUNITY): Payer: Self-pay

## 2018-12-15 DIAGNOSIS — S91301S Unspecified open wound, right foot, sequela: Secondary | ICD-10-CM | POA: Diagnosis present

## 2018-12-15 DIAGNOSIS — M79671 Pain in right foot: Secondary | ICD-10-CM

## 2018-12-15 DIAGNOSIS — R600 Localized edema: Secondary | ICD-10-CM

## 2018-12-15 NOTE — Therapy (Signed)
[place], [place], [address] Phone: [phone]   Fax:  [phone]  Wound Care Therapy  Patient Details  Name: Suzanne Tran MRN: [id number] Date of Birth: [birth year] No data recorded  Encounter Date: 12/15/2018  PT End of Session - 12/15/18 1247    Visit Number  41    Number of Visits  38    Date for PT Re-Evaluation  01/16/19    Authorization Type  no insurance, self pay    Authorization Time Period  07/18/18-08/28/18; second: 08/21/18-09/25/18; third: 09/25/18-11/06/18; 11/03/18-12/05/18; 11/21/18-01/16/19    PT Start Time  1020    PT Stop Time  1107    PT Time Calculation (min)  47 min    Activity Tolerance  Patient tolerated treatment well;No increased pain    Behavior During Therapy  WFL for tasks assessed/performed       Past Medical History:  Diagnosis Date  . Diabetes mellitus without complication ([place])   . Hypertension   . Renal disorder     Past Surgical History:  Procedure Laterality Date  . ANKLE SURGERY    . KNEE SURGERY    . REPLACEMENT TOTAL KNEE      There were no vitals filed for this visit.     Wound Therapy - 12/15/18 1112    Subjective  Patient arrives with dressing intact. She did not place any extra gauze under the dressing and has not had as much drainage.    Patient and Family Stated Goals  wound to heal     Date of Onset  06/20/18    Prior Treatments  self care     Pain Scale  0-10    Pain Score  0-No pain    Evaluation and Treatment Procedures Explained to Patient/Family  Yes    Evaluation and Treatment Procedures  agreed to    Wound Properties Date First Assessed: 07/18/18 Time First Assessed: 0827 Wound Type: Diabetic ulcer Location: Foot Location Orientation: Right Wound Description (Comments): plantar aspect  Present on Admission: Yes   Dressing Type  Gauze (Comment);Abdominal pads;Silver hydrofiber    Dressing Changed  Changed    Dressing Status  Old drainage    Dressing  Change Frequency  PRN    Site / Wound Assessment  Red    % Wound base Red or Granulating  95%    % Wound base Yellow/Fibrinous Exudate  0%    % Wound base Other/Granulation Tissue (Comment)  5%   callous perimeter   Peri-wound Assessment  Erythema (non-blanchable);Maceration    Margins  Epibole (rolled edges)   75%   Closure  None    Drainage Amount  Moderate    Drainage Description  Serosanguineous    Treatment  Cleansed;Debridement (Selective)    Selective Debridement - Location  epiboled edges, callous surrounding wound    Selective Debridement - Tools Used  Forceps;Scalpel    Selective Debridement - Tissue Removed  callous, devitalized tissue    Wound Therapy - Clinical Statement  Patient's wound presents with ongoing moderate drainage and epiboled margins superiorly. Wound bed continues to fill in inferiorly and margins debrided to promote approximation. Substantial callous build up removed with sharps debridement around periwound and low level laser therapy continued. She will continue to benefit from skilled wound care to promote healing.    Wound Therapy - Functional Problem List  difficulty walking; donning boot     Factors Delaying/Impairing Wound Healing  Altered sensation;Diabetes Mellitus;Multiple medical problems  Hydrotherapy Plan  Debridement;Dressing change;Patient/family education    Wound Therapy - Frequency  2X / week   extend for 6 more weeks for a total of 15 weeks.    Wound Therapy - Current Recommendations  PT    Wound Plan  continue with appropriate woundcare; resume PL if needed.  continue sharps debridment.  F/U on orthotic consult.    Dressing   Silver hydrofiber, calcium alginate, ABD, 4x4, Kerlix and netting #5    Modality  laser, continuous to increase circulation 4 places overlapping 25%, 5 Joules 0:13" each spot.          PT Short Term Goals - 11/03/18 1356      PT SHORT TERM GOAL #1   Title  PT to verbalize the signs and sx of cellulitis and to  contact MD ASAP     Time  1    Period  Weeks    Status  Achieved      PT SHORT TERM GOAL #2   Title  PT callous to be removed from wound to allow decrease pressure to allow wound to heal     Time  3    Period  Weeks    Status  Achieved      PT SHORT TERM GOAL #3   Title  wound drainage to decrease to scant to min to allow pt to be able to don socks without soiling     Time  3    Period  Weeks    Status  On-going      PT SHORT TERM GOAL #4   Title  epiboled edges to be gone to allow wound to begin healing     Time  3    Period  Weeks    Status  Partially Met        PT Long Term Goals - 11/03/18 1356      PT LONG TERM GOAL #1   Title  PT wound to have no depth to allow pt to be comfortable with self care     Time  6    Period  Weeks    Status  On-going      PT LONG TERM GOAL #2   Title  PT wound length and width to have decreased at least 1 cm to demonstrate a healing environment for pt to begin self care.     Time  6    Period  Weeks    Status  On-going      PT LONG TERM GOAL #3   Title  PT edema to have decreased to facilitate donning of boot.     Time  6    Period  Weeks    Status  Achieved         Plan - 12/15/18 1248    Clinical Impression Statement  see above    Rehab Potential  Fair    PT Frequency  2x / week    PT Duration  8 weeks    PT Treatment/Interventions  ADLs/Self Care Home Management;Patient/family education;Other (comment)   debridement/dressing change, low level laser therapy   PT Next Visit Plan  see above    Consulted and Agree with Plan of Care  Patient       Patient will benefit from skilled therapeutic intervention in order to improve the following deficits and impairments:  Pain, Decreased skin integrity, Difficulty walking, Increased edema  Visit Diagnosis: Localized edema  Open wound of right foot with complication, sequela  Pain in right foot     Problem List There are no active problems to display for this  patient.   Suzanne Tran, PT, DPT, [facility] Physical Therapist with [facility]  12/15/2018 12:49 PM    [place], [place], [address] Phone: [phone]   Fax:  [phone]  Name: Suzanne Tran MRN: [id number] Date of Birth: 24-Nov-1955

## 2018-12-19 ENCOUNTER — Ambulatory Visit (HOSPITAL_COMMUNITY): Payer: Medicaid Other

## 2018-12-19 ENCOUNTER — Other Ambulatory Visit: Payer: Self-pay

## 2018-12-19 ENCOUNTER — Encounter (HOSPITAL_COMMUNITY): Payer: Self-pay

## 2018-12-19 DIAGNOSIS — R600 Localized edema: Secondary | ICD-10-CM

## 2018-12-19 DIAGNOSIS — S91301S Unspecified open wound, right foot, sequela: Secondary | ICD-10-CM

## 2018-12-19 DIAGNOSIS — M79671 Pain in right foot: Secondary | ICD-10-CM

## 2018-12-19 NOTE — Therapy (Addendum)
[place], [place], [address] Phone: [phone]   Fax:  [phone]  Wound Care Therapy  Patient Details  Name: Suzanne Tran MRN: [id number] Date of Birth: January 10, 1956 No data recorded  Encounter Date: 12/19/2018   Progress Note Reporting Period 10/27/18 to 12/19/18  See note below for Objective Data and Assessment of Progress/Goals.     PT End of Session - 12/19/18 1246    Visit Number  42    Number of Visits  13    Date for PT Re-Evaluation  01/16/19    Authorization Type  no insurance, self pay    Authorization Time Period  07/18/18-08/28/18; second: 08/21/18-09/25/18; third: 09/25/18-11/06/18; 11/03/18-12/05/18; 11/21/18-01/16/19    PT Start Time  1026    PT Stop Time  1114    PT Time Calculation (min)  48 min    Activity Tolerance  Patient tolerated treatment well;No increased pain    Behavior During Therapy  WFL for tasks assessed/performed       Past Medical History:  Diagnosis Date  . Diabetes mellitus without complication ([place])   . Hypertension   . Renal disorder     Past Surgical History:  Procedure Laterality Date  . ANKLE SURGERY    . KNEE SURGERY    . REPLACEMENT TOTAL KNEE      There were no vitals filed for this visit.    Wound Therapy - 12/19/18 1236    Subjective  Patient reports her brother has called 2 of the 3 orthotist offices but has not made an appointment yet. She reports her vision has improved some and she had a follow up at [facility] for more testing and they were impressed it improved as much as it did.     Patient and Family Stated Goals  wound to heal     Date of Onset  06/20/18    Prior Treatments  self care     Pain Scale  0-10    Pain Score  0-No pain    Evaluation and Treatment Procedures Explained to Patient/Family  Yes    Evaluation and Treatment Procedures  agreed to    Wound Properties Date First Assessed: 07/18/18 Time First Assessed: 0827 Wound Type: Diabetic ulcer  Location: Foot Location Orientation: Right Wound Description (Comments): plantar aspect  Present on Admission: Yes   Dressing Type  Gauze (Comment);Abdominal pads;Silver hydrofiber    Dressing Changed  Changed    Dressing Status  Old drainage    Dressing Change Frequency  PRN    Site / Wound Assessment  Red    % Wound base Red or Granulating  95%    % Wound base Yellow/Fibrinous Exudate  0%    % Wound base Other/Granulation Tissue (Comment)  5%   callous perimeter   Peri-wound Assessment  Erythema (non-blanchable);Maceration    Wound Length (cm)  1.6 cm   debrided callous away; was 1.3   Wound Width (cm)  0.7 cm   0.8   Wound Depth (cm)  0.6 cm   0.7   Wound Volume (cm^3)  0.67 cm^3    Wound Surface Area (cm^2)  1.12 cm^2    Margins  Epibole (rolled edges)   75%   Closure  None    Drainage Amount  Moderate    Drainage Description  Serosanguineous    Treatment  Cleansed;Debridement (Selective)   low level laser therapy   Selective Debridement - Location  epiboled edges, callous surrounding wound  Selective Debridement - Tools Used  Forceps;Scalpel;Scissors    Selective Debridement - Tissue Removed  callous, devitalized tissue    Wound Therapy - Clinical Statement  Patient presented with increased callous this date after significant debridement of callous performed last session. She reported increased walking and activity this week. She has not followed up to make an appointment with and orthotist for her shoe insert or diabetic shoes. Debridement performed to calloused periwound and margins of wound revealing what appears to be healthier, red tissue underneath a pale with tissue at edges. Pt's wound continues to approximate and fill in at more inferior portion of the wound bed. She will continue to benefit from skilled wound care to promote healing.    Wound Therapy - Functional Problem List  difficulty walking; donning boot     Factors Delaying/Impairing Wound Healing  Altered  sensation;Diabetes Mellitus;Multiple medical problems    Hydrotherapy Plan  Debridement;Dressing change;Patient/family education    Wound Therapy - Frequency  2X / week   extend for 6 more weeks for a total of 15 weeks.    Wound Therapy - Current Recommendations  PT    Wound Plan  continue with appropriate woundcare; resume PL if needed.  continue sharps debridment.  F/U on orthotic consult.    Dressing   Silver hydrofiber, ABD, 4x4, Kerlix and netting #5    Modality  laser, continuous to increase circulation 4 places overlapping 25%, 5 Joules 0:13" each spot.          PT Short Term Goals - 11/03/18 1356      PT SHORT TERM GOAL #1   Title  PT to verbalize the signs and sx of cellulitis and to contact MD ASAP     Time  1    Period  Weeks    Status  Achieved      PT SHORT TERM GOAL #2   Title  PT callous to be removed from wound to allow decrease pressure to allow wound to heal     Time  3    Period  Weeks    Status  Achieved      PT SHORT TERM GOAL #3   Title  wound drainage to decrease to scant to min to allow pt to be able to don socks without soiling     Time  3    Period  Weeks    Status  On-going      PT SHORT TERM GOAL #4   Title  epiboled edges to be gone to allow wound to begin healing     Time  3    Period  Weeks    Status  Partially Met        PT Long Term Goals - 11/03/18 1356      PT LONG TERM GOAL #1   Title  PT wound to have no depth to allow pt to be comfortable with self care     Time  6    Period  Weeks    Status  On-going      PT LONG TERM GOAL #2   Title  PT wound length and width to have decreased at least 1 cm to demonstrate a healing environment for pt to begin self care.     Time  6    Period  Weeks    Status  On-going      PT LONG TERM GOAL #3   Title  PT edema to have decreased to facilitate donning of  boot.     Time  6    Period  Weeks    Status  Achieved        Plan - 12/19/18 1247    Clinical Impression Statement  see above     Rehab Potential  Fair    PT Frequency  2x / week    PT Duration  8 weeks    PT Treatment/Interventions  ADLs/Self Care Home Management;Patient/family education;Other (comment)   debridement/dressing change, low level laser therapy   PT Next Visit Plan  see above    Consulted and Agree with Plan of Care  Patient       Patient will benefit from skilled therapeutic intervention in order to improve the following deficits and impairments:  Pain, Decreased skin integrity, Difficulty walking, Increased edema  Visit Diagnosis: Localized edema  Open wound of right foot with complication, sequela  Pain in right foot     Problem List There are no active problems to display for this patient.   Kipp Brood, PT, DPT, [facility] Physical Therapist with [facility]  12/19/2018 12:47 PM    [place], [place], [address] Phone: [phone]   Fax:  [phone]  Name: Suzanne Tran MRN: [id number] Date of Birth: 02-06-1956

## 2018-12-22 ENCOUNTER — Telehealth (HOSPITAL_COMMUNITY): Payer: Self-pay | Admitting: Internal Medicine

## 2018-12-22 ENCOUNTER — Ambulatory Visit (HOSPITAL_COMMUNITY): Payer: Medicaid Other

## 2018-12-22 NOTE — Telephone Encounter (Signed)
12/22/18  pt fell and is pretty shook up and wanted to reschedul appt.   she is coming in 6/9

## 2018-12-23 ENCOUNTER — Other Ambulatory Visit: Payer: Self-pay

## 2018-12-23 ENCOUNTER — Ambulatory Visit (HOSPITAL_COMMUNITY): Payer: Medicaid Other | Admitting: Physical Therapy

## 2018-12-23 DIAGNOSIS — S91301S Unspecified open wound, right foot, sequela: Secondary | ICD-10-CM

## 2018-12-23 DIAGNOSIS — M79671 Pain in right foot: Secondary | ICD-10-CM

## 2018-12-23 DIAGNOSIS — R600 Localized edema: Secondary | ICD-10-CM

## 2018-12-23 NOTE — Therapy (Signed)
[place], [place], [address] Phone: [phone]   Fax:  [phone]  Wound Care Therapy  Patient Details  Name: Suzanne Tran MRN: [id number] Date of Birth: 1955-09-22 No data recorded  Encounter Date: 12/23/2018  PT End of Session - 12/23/18 1721    Visit Number  43    Number of Visits  89    Date for PT Re-Evaluation  01/16/19    Authorization Type  no insurance, self pay    Authorization Time Period  07/18/18-08/28/18; second: 08/21/18-09/25/18; third: 09/25/18-11/06/18; 11/03/18-12/05/18; 11/21/18-01/16/19    PT Start Time  1440    PT Stop Time  1530    PT Time Calculation (min)  50 min    Activity Tolerance  Patient tolerated treatment well;No increased pain    Behavior During Therapy  WFL for tasks assessed/performed       Past Medical History:  Diagnosis Date  . Diabetes mellitus without complication ([place])   . Hypertension   . Renal disorder     Past Surgical History:  Procedure Laterality Date  . ANKLE SURGERY    . KNEE SURGERY    . REPLACEMENT TOTAL KNEE      There were no vitals filed for this visit.              Wound Therapy - 12/23/18 1702    Subjective  no issues today other than she took a minor fall yesterday down the steps.  No injuries noted but could not make her appointment yesteday.    States she called all 3 orthotic places given on sheet yesterday to determine which would be closer.  STates she will call back and make an appoitment tomorrow.      Patient and Family Stated Goals  wound to heal     Date of Onset  06/20/18    Prior Treatments  self care     Pain Scale  0-10    Pain Score  0-No pain    Evaluation and Treatment Procedures Explained to Patient/Family  Yes    Evaluation and Treatment Procedures  agreed to    Wound Properties Date First Assessed: 07/18/18 Time First Assessed: 0827 Wound Type: Diabetic ulcer Location: Foot Location Orientation: Right Wound Description  (Comments): plantar aspect  Present on Admission: Yes   Dressing Type  Gauze (Comment);Abdominal pads;Silver hydrofiber    Dressing Changed  Changed    Dressing Status  Old drainage    Dressing Change Frequency  PRN    Site / Wound Assessment  Red    % Wound base Red or Granulating  95%    % Wound base Yellow/Fibrinous Exudate  0%    % Wound base Other/Granulation Tissue (Comment)  5%   callous perimeter   Peri-wound Assessment  Erythema (non-blanchable);Maceration    Margins  Epibole (rolled edges)   75%   Closure  None    Drainage Amount  Moderate    Drainage Description  Serosanguineous    Treatment  Debridement (Selective);Cleansed    Selective Debridement - Location  epiboled edges, callous surrounding wound    Selective Debridement - Tools Used  Forceps;Scalpel;Scissors    Selective Debridement - Tissue Removed  callous, devitalized tissue    Wound Therapy - Clinical Statement  Remvoed significan amount of callous, devitalized tissue from peimeter of wound with scapel.  Area also heaviliy macerated due to increased drainage.  Odor also present but no redness or other signs of infection.  Cleansed well and completed laser prior to redressing wound.  Encouraged to make call for orthotic consult  tomorrow.      Wound Therapy - Functional Problem List  difficulty walking; donning boot     Factors Delaying/Impairing Wound Healing  Altered sensation;Diabetes Mellitus;Multiple medical problems    Hydrotherapy Plan  Debridement;Dressing change;Patient/family education    Wound Therapy - Frequency  2X / week   extend for 6 more weeks for a total of 15 weeks.    Wound Therapy - Current Recommendations  PT    Wound Plan  continue with appropriate woundcare; resume PL if needed.  continue sharps debridment.  F/U on orthotic consult.    Dressing   Silver hydrofiber, ABD, 4x4, Kerlix and netting #5    Modality  laser: to increase circulation, chronic continuous 2 places overlapping 50%, 75  Joules 1:45" each spot.                 PT Short Term Goals - 11/03/18 1356      PT SHORT TERM GOAL #1   Title  PT to verbalize the signs and sx of cellulitis and to contact MD ASAP     Time  1    Period  Weeks    Status  Achieved      PT SHORT TERM GOAL #2   Title  PT callous to be removed from wound to allow decrease pressure to allow wound to heal     Time  3    Period  Weeks    Status  Achieved      PT SHORT TERM GOAL #3   Title  wound drainage to decrease to scant to min to allow pt to be able to don socks without soiling     Time  3    Period  Weeks    Status  On-going      PT SHORT TERM GOAL #4   Title  epiboled edges to be gone to allow wound to begin healing     Time  3    Period  Weeks    Status  Partially Met        PT Long Term Goals - 11/03/18 1356      PT LONG TERM GOAL #1   Title  PT wound to have no depth to allow pt to be comfortable with self care     Time  6    Period  Weeks    Status  On-going      PT LONG TERM GOAL #2   Title  PT wound length and width to have decreased at least 1 cm to demonstrate a healing environment for pt to begin self care.     Time  6    Period  Weeks    Status  On-going      PT LONG TERM GOAL #3   Title  PT edema to have decreased to facilitate donning of boot.     Time  6    Period  Weeks    Status  Achieved              Patient will benefit from skilled therapeutic intervention in order to improve the following deficits and impairments:     Visit Diagnosis: Localized edema  Open wound of right foot with complication, sequela  Pain in right foot     Problem List There are no active problems to display for this patient.  [name], PTA/CLT [id number]  [name] 12/23/2018, 5:22 PM  [facility] [address] [place], [place], [address] Phone: [phone]   Fax:  [phone]  Name: Suzanne Tran MRN: [id number] Date of  Birth: 04/03/1956

## 2018-12-26 ENCOUNTER — Encounter (HOSPITAL_COMMUNITY): Payer: Self-pay

## 2018-12-26 ENCOUNTER — Ambulatory Visit (HOSPITAL_COMMUNITY): Payer: Medicaid Other

## 2018-12-26 ENCOUNTER — Other Ambulatory Visit: Payer: Self-pay

## 2018-12-26 DIAGNOSIS — R600 Localized edema: Secondary | ICD-10-CM | POA: Diagnosis not present

## 2018-12-26 DIAGNOSIS — M79671 Pain in right foot: Secondary | ICD-10-CM

## 2018-12-26 DIAGNOSIS — S91301S Unspecified open wound, right foot, sequela: Secondary | ICD-10-CM

## 2018-12-26 NOTE — Therapy (Signed)
[place], [place], [address] Phone: [phone]   Fax:  [phone]  Wound Care Therapy  Patient Details  Name: Suzanne Tran MRN: [id number] Date of Birth: [birth year] No data recorded  Encounter Date: 12/26/2018  PT End of Session - 12/26/18 1200    Visit Number  44    Number of Visits  49    Date for PT Re-Evaluation  01/16/19    Authorization Type  no insurance, self pay    Authorization Time Period  07/18/18-08/28/18; second: 08/21/18-09/25/18; third: 09/25/18-11/06/18; 11/03/18-12/05/18; 11/21/18-01/16/19    PT Start Time  1022    PT Stop Time  1108    PT Time Calculation (min)  46 min    Activity Tolerance  Patient tolerated treatment well;No increased pain    Behavior During Therapy  WFL for tasks assessed/performed       Past Medical History:  Diagnosis Date  . Diabetes mellitus without complication ([place])   . Hypertension   . Renal disorder     Past Surgical History:  Procedure Laterality Date  . ANKLE SURGERY    . KNEE SURGERY    . REPLACEMENT TOTAL KNEE      There were no vitals filed for this visit.   Wound Therapy - 12/26/18 1154    Subjective  Pt reports she fell on Tuesday coiming our her steps to come to her wound appointment and that she did not get hurt badly but that is why she did not come to the appointment. She denies pain or changes with the drainage at her wound.     Patient and Family Stated Goals  wound to heal     Date of Onset  06/20/18    Prior Treatments  self care     Pain Scale  0-10    Pain Score  0-No pain    Evaluation and Treatment Procedures Explained to Patient/Family  Yes    Evaluation and Treatment Procedures  agreed to    Wound Properties Date First Assessed: 07/18/18 Time First Assessed: 0827 Wound Type: Diabetic ulcer Location: Foot Location Orientation: Right Wound Description (Comments): plantar aspect  Present on Admission: Yes   Dressing Type  Gauze  (Comment);Abdominal pads;Silver hydrofiber    Dressing Changed  Changed    Dressing Status  Old drainage    Dressing Change Frequency  PRN    Site / Wound Assessment  Red    % Wound base Red or Granulating  95%    % Wound base Yellow/Fibrinous Exudate  0%    % Wound base Other/Granulation Tissue (Comment)  5%   callous perimeter   Peri-wound Assessment  Erythema (non-blanchable);Maceration    Wound Length (cm)  1.6 cm    Wound Width (cm)  0.7 cm    Wound Depth (cm)  0.5 cm    Wound Volume (cm^3)  0.56 cm^3    Wound Surface Area (cm^2)  1.12 cm^2    Margins  Epibole (rolled edges)   75%   Closure  None    Drainage Amount  Moderate    Drainage Description  Serosanguineous    Treatment  Cleansed;Debridement (Selective)   low level laser therapy   Selective Debridement - Location  epiboled edges, callous surrounding wound    Selective Debridement - Tools Used  Forceps;Scalpel;Scissors    Selective Debridement - Tissue Removed  callous, devitalized tissue    Wound Therapy - Clinical Statement  Patient's callous is not as prominent  this date as prior session. The wound bed continues to fill in around the inferior portion and has almost attached to the wound margin. No excessive maceration noted even though patient went 1 full week between sessions. Wound has not reduced in length or width however the depth has decreased indicating improved healing. Continued to cleanse and debride margins to promote approximation. Low level laser therapy completed and silver hydrofiber applied to wound. Continued to encourage patient to make appointment for orthotic consult to reduce weightbearing/pressure on wound.     Wound Therapy - Functional Problem List  difficulty walking; donning boot     Factors Delaying/Impairing Wound Healing  Altered sensation;Diabetes Mellitus;Multiple medical problems    Hydrotherapy Plan  Debridement;Dressing change;Patient/family education    Wound Therapy - Frequency  2X /  week   extend for 6 more weeks for a total of 15 weeks.    Wound Therapy - Current Recommendations  PT    Wound Plan  continue with appropriate woundcare; resume PL if needed.  continue sharps debridment.  F/U on orthotic consult.    Dressing   Silver hydrofiber, ABD, 4x4, Kerlix and netting #5    Modality  laser, continuous to increase circulation 4 places overlapping 25%, 8 Joules 0:18" each spot.          PT Education - 12/26/18 1200    Education Details  Educated again that patient needs to obtain custom orthotic/shoe for Rt foot to reduce weight bearing on pressure points and allow wound to heal.    Person(s) Educated  Patient    Methods  Explanation    Comprehension  Verbalized understanding       PT Short Term Goals - 11/03/18 1356      PT SHORT TERM GOAL #1   Title  PT to verbalize the signs and sx of cellulitis and to contact MD ASAP     Time  1    Period  Weeks    Status  Achieved      PT SHORT TERM GOAL #2   Title  PT callous to be removed from wound to allow decrease pressure to allow wound to heal     Time  3    Period  Weeks    Status  Achieved      PT SHORT TERM GOAL #3   Title  wound drainage to decrease to scant to min to allow pt to be able to don socks without soiling     Time  3    Period  Weeks    Status  On-going      PT SHORT TERM GOAL #4   Title  epiboled edges to be gone to allow wound to begin healing     Time  3    Period  Weeks    Status  Partially Met        PT Long Term Goals - 11/03/18 1356      PT LONG TERM GOAL #1   Title  PT wound to have no depth to allow pt to be comfortable with self care     Time  6    Period  Weeks    Status  On-going      PT LONG TERM GOAL #2   Title  PT wound length and width to have decreased at least 1 cm to demonstrate a healing environment for pt to begin self care.     Time  6    Period  Weeks  Status  On-going      PT LONG TERM GOAL #3   Title  PT edema to have decreased to facilitate  donning of boot.     Time  6    Period  Weeks    Status  Achieved        Plan - 12/26/18 1202    Clinical Impression Statement  see above    Rehab Potential  Fair    PT Frequency  2x / week    PT Duration  8 weeks    PT Treatment/Interventions  ADLs/Self Care Home Management;Patient/family education;Other (comment)   debridement/dressing change, low level laser therapy   PT Next Visit Plan  see above    Consulted and Agree with Plan of Care  Patient       Patient will benefit from skilled therapeutic intervention in order to improve the following deficits and impairments:  Pain, Decreased skin integrity, Difficulty walking, Increased edema  Visit Diagnosis: Localized edema   Open wound of right foot with complication, sequela  Pain in right foot      Problem List There are no active problems to display for this patient.   Kipp Brood, PT, DPT, [facility] Physical Therapist with [facility]  12/26/2018 12:04 PM    [place], [place], [address] Phone: [phone]   Fax:  [phone]  Name: Suzanne Tran MRN: [id number] Date of Birth: 1955-12-11

## 2018-12-29 ENCOUNTER — Encounter (HOSPITAL_COMMUNITY): Payer: Self-pay

## 2018-12-29 ENCOUNTER — Other Ambulatory Visit: Payer: Self-pay

## 2018-12-29 ENCOUNTER — Ambulatory Visit (HOSPITAL_COMMUNITY): Payer: Medicaid Other

## 2018-12-29 DIAGNOSIS — S91301S Unspecified open wound, right foot, sequela: Secondary | ICD-10-CM

## 2018-12-29 DIAGNOSIS — R600 Localized edema: Secondary | ICD-10-CM

## 2018-12-29 DIAGNOSIS — M79671 Pain in right foot: Secondary | ICD-10-CM

## 2018-12-29 NOTE — Therapy (Signed)
[place], [place], [address] Phone: [phone]   Fax:  [phone]  Wound Care Therapy  Patient Details  Name: Suzanne Tran MRN: [id number] Date of Birth: Apr 15, 1956 No data recorded  Encounter Date: 12/29/2018  PT End of Session - 12/29/18 1201    Visit Number  45    Number of Visits  64    Date for PT Re-Evaluation  01/16/19    Authorization Type  no insurance, self pay    Authorization Time Period  07/18/18-08/28/18; second: 08/21/18-09/25/18; third: 09/25/18-11/06/18; 11/03/18-12/05/18; 11/21/18-01/16/19    PT Start Time  1018    PT Stop Time  1104    PT Time Calculation (min)  46 min    Activity Tolerance  Patient tolerated treatment well    Behavior During Therapy  [facility] for tasks assessed/performed       Past Medical History:  Diagnosis Date  . Diabetes mellitus without complication ([place])   . Hypertension   . Renal disorder     Past Surgical History:  Procedure Laterality Date  . ANKLE SURGERY    . KNEE SURGERY    . REPLACEMENT TOTAL KNEE      There were no vitals filed for this visit.   Wound Therapy - 12/29/18 1156    Subjective  Patient reports she is having trouble breathing with the droplet mask on and is not wearing the handmade one she typically does today. She reports she has an appointment in [place] [facility] on Wednesday or THursday for the orthotic referral.     Patient and Family Stated Goals  wound to heal     Date of Onset  06/20/18    Prior Treatments  self care     Pain Scale  0-10    Pain Score  0-No pain    Evaluation and Treatment Procedures Explained to Patient/Family  Yes    Evaluation and Treatment Procedures  agreed to    Wound Properties Date First Assessed: 07/18/18 Time First Assessed: 0827 Wound Type: Diabetic ulcer Location: Foot Location Orientation: Right Wound Description (Comments): plantar aspect  Present on Admission: Yes   Dressing Type  Gauze (Comment);Abdominal pads;Silver  hydrofiber    Dressing Changed  Changed    Dressing Status  Old drainage    Dressing Change Frequency  PRN    Site / Wound Assessment  Red    % Wound base Red or Granulating  95%    % Wound base Yellow/Fibrinous Exudate  0%    % Wound base Other/Granulation Tissue (Comment)  5%   callous perimeter   Peri-wound Assessment  Erythema (non-blanchable);Maceration    Margins  Epibole (rolled edges)   75%   Closure  None    Drainage Amount  Moderate    Drainage Description  Serosanguineous    Treatment  Cleansed;Debridement (Selective)   low level laser therapy   Selective Debridement - Location  epiboled edges, callous surrounding wound    Selective Debridement - Tools Used  Forceps;Scalpel;Scissors    Selective Debridement - Tissue Removed  callous, devitalized tissue    Wound Therapy - Clinical Statement  At start of session pt complained of mask being difficult to breathe in and began to hyperventilate. Mask was removed in private treatment room. Pt's wound presents with increase in callous compared to last session. Improvements noted with wound bed filling in and approximating well inferiorly with margin. Some approximation is noted between wound bed and superomedial wound margins. Continued to  cleanse and debride epiboled margins and callous to promote approximation. Low level laser therapy completed and silver hydrofiber applied to wound. Pt will continue to benefit from skilled wound care to promote healing.     Wound Therapy - Functional Problem List  difficulty walking; donning boot     Factors Delaying/Impairing Wound Healing  Altered sensation;Diabetes Mellitus;Multiple medical problems    Hydrotherapy Plan  Debridement;Dressing change;Patient/family education    Wound Therapy - Frequency  2X / week   extend for 6 more weeks for a total of 15 weeks.    Wound Therapy - Current Recommendations  PT    Wound Plan  continue with appropriate woundcare; resume PL if needed.  continue sharps  debridment.  F/U on orthotic consult.    Dressing   Silver hydrofiber, ABD, 4x4, Kerlix and netting #5    Modality  laser, continuous to increase circulation 4 places overlapping 25%, 8 Joules 0:18" each spot.           PT Short Term Goals - 11/03/18 1356      PT SHORT TERM GOAL #1   Title  PT to verbalize the signs and sx of cellulitis and to contact MD ASAP     Time  1    Period  Weeks    Status  Achieved      PT SHORT TERM GOAL #2   Title  PT callous to be removed from wound to allow decrease pressure to allow wound to heal     Time  3    Period  Weeks    Status  Achieved      PT SHORT TERM GOAL #3   Title  wound drainage to decrease to scant to min to allow pt to be able to don socks without soiling     Time  3    Period  Weeks    Status  On-going      PT SHORT TERM GOAL #4   Title  epiboled edges to be gone to allow wound to begin healing     Time  3    Period  Weeks    Status  Partially Met        PT Long Term Goals - 11/03/18 1356      PT LONG TERM GOAL #1   Title  PT wound to have no depth to allow pt to be comfortable with self care     Time  6    Period  Weeks    Status  On-going      PT LONG TERM GOAL #2   Title  PT wound length and width to have decreased at least 1 cm to demonstrate a healing environment for pt to begin self care.     Time  6    Period  Weeks    Status  On-going      PT LONG TERM GOAL #3   Title  PT edema to have decreased to facilitate donning of boot.     Time  6    Period  Weeks    Status  Achieved        Plan - 12/29/18 1201    Clinical Impression Statement  see above    Rehab Potential  Fair    PT Frequency  2x / week    PT Duration  8 weeks    PT Treatment/Interventions  ADLs/Self Care Home Management;Patient/family education;Other (comment)   debridement/dressing change, low level laser therapy   PT  Next Visit Plan  see above    Consulted and Agree with Plan of Care  Patient       Patient will benefit from  skilled therapeutic intervention in order to improve the following deficits and impairments:  Pain, Decreased skin integrity, Difficulty walking, Increased edema  Visit Diagnosis: Localized edema  Open wound of right foot with complication, sequela  Pain in right foot     Problem List There are no active problems to display for this patient.   Kipp Brood, PT, DPT, [facility] Physical Therapist with [facility]  12/29/2018 12:02 PM    [place] [address] [place], [place], [address] Phone: [phone]   Fax:  [phone]  Name: Suzanne Tran MRN: [id number] Date of Birth: 01-21-1956

## 2019-01-02 ENCOUNTER — Ambulatory Visit (HOSPITAL_COMMUNITY): Payer: Medicaid Other | Admitting: Physical Therapy

## 2019-01-02 ENCOUNTER — Other Ambulatory Visit: Payer: Self-pay

## 2019-01-02 ENCOUNTER — Encounter (HOSPITAL_COMMUNITY): Payer: Self-pay | Admitting: Physical Therapy

## 2019-01-02 DIAGNOSIS — R600 Localized edema: Secondary | ICD-10-CM

## 2019-01-02 DIAGNOSIS — S91301S Unspecified open wound, right foot, sequela: Secondary | ICD-10-CM

## 2019-01-02 NOTE — Therapy (Addendum)
[place], [place], [address] Phone: [phone]   Fax:  [phone]  Wound Care Therapy  Patient Details  Name: Suzanne Tran MRN: [id number] Date of Birth: [birth year] No data recorded  Encounter Date: 01/02/2019  PT End of Session - 01/02/19 1511    Visit Number  61    Number of Visits  35    Date for PT Re-Evaluation  01/16/19    Authorization Type  no insurance, self pay    Authorization Time Period  07/18/18-08/28/18; second: 08/21/18-09/25/18; third: 09/25/18-11/06/18; 11/03/18-12/05/18; 11/21/18-01/16/19    PT Start Time  1030    PT Stop Time  1130    PT Time Calculation (min)  60 min    Activity Tolerance  Patient tolerated treatment well    Behavior During Therapy  [facility] for tasks assessed/performed       Past Medical History:  Diagnosis Date  . Diabetes mellitus without complication ([place])   . Hypertension   . Renal disorder     Past Surgical History:  Procedure Laterality Date  . ANKLE SURGERY    . KNEE SURGERY    . REPLACEMENT TOTAL KNEE      There were no vitals filed for this visit.              Wound Therapy - 01/02/19 1450    Subjective  Pt states she cancelled her appt with orthotist yesterday as she was not feeling up to it.  States her legs have felt tight, swollen and she has no energy.     Patient and Family Stated Goals  wound to heal     Date of Onset  06/20/18    Prior Treatments  self care     Pain Scale  0-10    Pain Score  0-No pain    Evaluation and Treatment Procedures Explained to Patient/Family  Yes    Evaluation and Treatment Procedures  agreed to    Wound Properties Date First Assessed: 07/18/18 Time First Assessed: 0827 Wound Type: Diabetic ulcer Location: Foot Location Orientation: Right Wound Description (Comments): plantar aspect  Present on Admission: Yes   Dressing Type  Gauze (Comment);Abdominal pads;Silver hydrofiber    Dressing Changed  Changed    Dressing  Status  Old drainage    Dressing Change Frequency  PRN    Site / Wound Assessment  Red    % Wound base Red or Granulating  95%    % Wound base Yellow/Fibrinous Exudate  0%    % Wound base Other/Granulation Tissue (Comment)  5%   callous perimeter   Peri-wound Assessment  Erythema (non-blanchable);Maceration    Wound Length (cm)  1 cm    Wound Width (cm)  0.5 cm    Wound Depth (cm)  0.5 cm    Wound Volume (cm^3)  0.25 cm^3    Wound Surface Area (cm^2)  0.5 cm^2    Margins  Epibole (rolled edges)   75%   Closure  None    Drainage Amount  Moderate    Drainage Description  Serosanguineous    Treatment  Cleansed;Debridement (Selective);Other (Comment)    Wound Properties Date First Assessed: 01/02/19 Time First Assessed: 1115 Wound Type: Other (Comment) Location: Leg Location Orientation: Right Wound Description (Comments): most superior, largest Present on Admission: No   Dressing Type  None    Dressing Changed  New    Dressing Change Frequency  PRN    Site / Wound Assessment  Red;Granulation tissue    % Wound base Red or Granulating  90%    % Wound base Yellow/Fibrinous Exudate  10%    Peri-wound Assessment  Erythema (blanchable)    Wound Length (cm)  2.5 cm    Wound Width (cm)  1 cm    Wound Depth (cm)  0.2 cm    Wound Volume (cm^3)  0.5 cm^3    Wound Surface Area (cm^2)  2.5 cm^2    Margins  Unattached edges (unapproximated)    Drainage Amount  Minimal    Drainage Description  Serosanguineous    Treatment  Cleansed;Debridement (Selective)    Wound Properties Date First Assessed: 01/02/19 Time First Assessed: 1115 Wound Type: Other (Comment) Location: Leg Location Orientation: Right Wound Description (Comments): beneath largest   Dressing Type  None    Dressing Changed  New    Dressing Change Frequency  PRN    % Wound base Red or Granulating  95%    % Wound base Yellow/Fibrinous Exudate  5%    Peri-wound Assessment  Erythema (blanchable)    Wound Length (cm)  1.5 cm    Wound  Width (cm)  1 cm    Wound Depth (cm)  0.2 cm    Wound Volume (cm^3)  0.3 cm^3    Wound Surface Area (cm^2)  1.5 cm^2    Margins  Unattached edges (unapproximated)    Drainage Amount  Minimal    Drainage Description  Serosanguineous    Treatment  Cleansed;Debridement (Selective)    Wound Properties Date First Assessed: 01/02/19 Time First Assessed: 1115 Wound Type: Other (Comment) Location: Leg Location Orientation: Right Wound Description (Comments): most lateral Present on Admission: No   Dressing Type  None    Dressing Changed  New    Dressing Status  Clean;Dry;Intact    Dressing Change Frequency  PRN    Site / Wound Assessment  Yellow;Pale    % Wound base Red or Granulating  0%    % Wound base Yellow/Fibrinous Exudate  100%    Peri-wound Assessment  Erythema (blanchable)    Wound Length (cm)  1 cm    Wound Width (cm)  1 cm    Wound Depth (cm)  0.2 cm    Wound Volume (cm^3)  0.2 cm^3    Wound Surface Area (cm^2)  1 cm^2    Drainage Amount  Scant    Drainage Description  Serous    Treatment  Cleansed;Debridement (Selective)    Selective Debridement - Location  epiboled edges, callous surrounding wound    Selective Debridement - Tools Used  Forceps;Scalpel;Scissors    Selective Debridement - Tissue Removed  callous, devitalized tissue    Wound Therapy - Clinical Statement  Wound plantar surface of Rt foot measured today with noted reduction in overall size.  continued to debride edges to promote approximation.  Noted swelling when trying to remove bandage.  Upon further expection, noted induration in LE with approx 12 scabbed areas and 3 open draining areas along lateral and anterior aspect of LE.  Pt reported her legs itch and she has been scratching them. Measured the open, driaining areas.  Evaluating therapist made aware and also inspected area with need to treat.  Cleansed and applied xeroform as well as profore to help reduce the edema.  Educated pateint on importance of activity in  improving her strength, managing her weight/stress and improving her functional tolerance.  Educated pateint on lymphedema and how unmanaged can worsen and lead to cellulitis  and other complications.  Educated on need to possibly wear garments after woundcare ended.  Educated on exercises to help improve LE strength/function.  Moisturizing to prevent need to scratch LE and general self care.  Instructed to reschedule orthotic appt asap.    Wound Therapy - Functional Problem List  difficulty walking; donning boot     Factors Delaying/Impairing Wound Healing  Altered sensation;Diabetes Mellitus;Multiple medical problems    Hydrotherapy Plan  Debridement;Dressing change;Patient/family education    Wound Therapy - Frequency  2X / week   extend for 6 more weeks for a total of 15 weeks.    Wound Therapy - Current Recommendations  PT    Wound Plan  continue with appropriate woundcare; resume PL if needed.  continue sharps debridment.  F/U on orthotic consult.    Dressing   Rt plantar foot:  Silver hydrofiber, ABD, 4x4, Kerlix and netting #5    Dressing  3 lateral LE wounds:  Xeroform, gauze and profore    Manual Therapy  circumferencial measurements Rt LE:  ankle 33 cm, 10cm above ankle 28.5cm, 20cm above ankle 49cm    Modality  laser: to plantar Rt foot to increase circulation, chronic continuous 2 places overlapping 50%, 75 Joules 1:45" each spot.               PT Education - 01/02/19 1514    Education Details  Educated patient on importance of activity in improving her strength, managing her weight/stress and improving her functional tolerance.  Educated patient on lymphedema and how unmanaged can worsen and lead to cellulitis and other complications.  Educated on need to possibly wear garments after woundcare ended.  Educated on exercises to help improve LE strength/function.  Moisturizing to prevent need to scratch LE and general self care.  Instructed to reschedule orthotic appt asap as the  wound will return after healed without proper pressure relief and support.    Person(s) Educated  Patient    Methods  Explanation    Comprehension  Verbalized understanding       PT Short Term Goals - 11/03/18 1356      PT SHORT TERM GOAL #1   Title  PT to verbalize the signs and sx of cellulitis and to contact MD ASAP     Time  1    Period  Weeks    Status  Achieved      PT SHORT TERM GOAL #2   Title  PT callous to be removed from wound to allow decrease pressure to allow wound to heal     Time  3    Period  Weeks    Status  Achieved      PT SHORT TERM GOAL #3   Title  wound drainage to decrease to scant to min to allow pt to be able to don socks without soiling     Time  3    Period  Weeks    Status  On-going      PT SHORT TERM GOAL #4   Title  epiboled edges to be gone to allow wound to begin healing     Time  3    Period  Weeks    Status  Partially Met        PT Long Term Goals - 11/03/18 1356      PT LONG TERM GOAL #1   Title  PT wound to have no depth to allow pt to be comfortable with self care  Time  6    Period  Weeks    Status  On-going      PT LONG TERM GOAL #2   Title  PT wound length and width to have decreased at least 1 cm to demonstrate a healing environment for pt to begin self care.     Time  6    Period  Weeks    Status  On-going      PT LONG TERM GOAL #3   Title  PT edema to have decreased to facilitate donning of boot.     Time  6    Period  Weeks    Status  Achieved              Patient will benefit from skilled therapeutic intervention in order to improve the following deficits and impairments:     Visit Diagnosis: 1. Open wound of right foot with complication, sequela   2. Localized edema        Problem List There are no active problems to display for this patient.  [name], PTA/CLT [id number]  [name] 01/02/2019, 3:22 PM  [place], [place], [address] Phone: [phone]   Fax:  [phone]  Name: Elgin Laines MRN: [id number] Date of Birth: 1956-04-27

## 2019-01-05 ENCOUNTER — Encounter (HOSPITAL_COMMUNITY): Payer: Self-pay

## 2019-01-05 ENCOUNTER — Other Ambulatory Visit: Payer: Self-pay

## 2019-01-05 ENCOUNTER — Ambulatory Visit (HOSPITAL_COMMUNITY): Payer: Medicaid Other

## 2019-01-05 DIAGNOSIS — R600 Localized edema: Secondary | ICD-10-CM | POA: Diagnosis not present

## 2019-01-05 DIAGNOSIS — S91301S Unspecified open wound, right foot, sequela: Secondary | ICD-10-CM

## 2019-01-05 DIAGNOSIS — M79671 Pain in right foot: Secondary | ICD-10-CM

## 2019-01-05 NOTE — Therapy (Signed)
[place], [place], [address] Phone: [phone]   Fax:  [phone]  Wound Care Therapy  Patient Details  Name: Suzanne Tran MRN: [id number] Date of Birth: 12/11/1955 No data recorded  Encounter Date: 01/05/2019  PT End of Session - 01/05/19 1314    Visit Number  79    Number of Visits  44    Date for PT Re-Evaluation  01/16/19    Authorization Type  no insurance, self pay    Authorization Time Period  07/18/18-08/28/18; second: 08/21/18-09/25/18; third: 09/25/18-11/06/18; 11/03/18-12/05/18; 11/21/18-01/16/19    PT Start Time  1020    PT Stop Time  1110    PT Time Calculation (min)  50 min    Activity Tolerance  Patient tolerated treatment well    Behavior During Therapy  [facility] for tasks assessed/performed       Past Medical History:  Diagnosis Date  . Diabetes mellitus without complication ([place])   . Hypertension   . Renal disorder     Past Surgical History:  Procedure Laterality Date  . ANKLE SURGERY    . KNEE SURGERY    . REPLACEMENT TOTAL KNEE      There were no vitals filed for this visit.     Wound Therapy - 01/05/19 1257    Subjective  Patient reports she is doing better today than she had been this weekend as she felt somewhat tired and weak yesterday. She denies pain and has not re-scheduled her appointment with the orthotist.     Patient and Family Stated Goals  wound to heal     Date of Onset  06/20/18    Prior Treatments  self care     Pain Scale  0-10    Pain Score  0-No pain    Evaluation and Treatment Procedures Explained to Patient/Family  Yes    Evaluation and Treatment Procedures  agreed to    Wound Properties Date First Assessed: 07/18/18 Time First Assessed: 0827 Wound Type: Diabetic ulcer Location: Foot Location Orientation: Right Wound Description (Comments): plantar aspect  Present on Admission: Yes   Dressing Type  Gauze (Comment);Abdominal pads;Silver hydrofiber    Dressing Changed   Changed    Dressing Status  Old drainage    Dressing Change Frequency  PRN    Site / Wound Assessment  Red    % Wound base Red or Granulating  95%    % Wound base Yellow/Fibrinous Exudate  0%    % Wound base Other/Granulation Tissue (Comment)  5%   callous perimeter   Peri-wound Assessment  Erythema (non-blanchable);Maceration    Margins  Epibole (rolled edges)   75%   Closure  None    Drainage Amount  Moderate    Drainage Description  Serosanguineous    Treatment  Cleansed;Debridement (Selective)    Wound Properties Date First Assessed: 01/02/19 Time First Assessed: 1115 Wound Type: Other (Comment) Location: Leg Location Orientation: Right Wound Description (Comments): most superior, largest Present on Admission: No   Dressing Type  None    Dressing Changed  Changed    Dressing Status  Old drainage    Dressing Change Frequency  PRN    Site / Wound Assessment  Red;Granulation tissue    % Wound base Red or Granulating  95%    % Wound base Yellow/Fibrinous Exudate  5%    Peri-wound Assessment  Erythema (blanchable)    Margins  Unattached edges (unapproximated)    Drainage Amount  Minimal    Drainage Description  Serosanguineous    Treatment  Cleansed;Debridement (Selective)    Wound Properties Date First Assessed: 01/02/19 Time First Assessed: 1115 Wound Type: Other (Comment) Location: Leg Location Orientation: Right Wound Description (Comments): most lateral Present on Admission: No   Dressing Type  None    Dressing Changed  Changed    Dressing Status  Clean;Dry;Intact    Dressing Change Frequency  PRN    Site / Wound Assessment  Yellow;Pale    % Wound base Red or Granulating  90%    % Wound base Yellow/Fibrinous Exudate  10%    Peri-wound Assessment  Erythema (blanchable)    Drainage Amount  Scant    Drainage Description  Serous    Treatment  Cleansed;Debridement (Selective)    Wound Properties Date First Assessed: 01/02/19 Time First Assessed: 1115 Wound Type: Other (Comment)  Location: Leg Location Orientation: Right Wound Description (Comments): beneath largest   Dressing Type  None    Dressing Changed  Changed    Dressing Status  Old drainage    Dressing Change Frequency  PRN    % Wound base Red or Granulating  95%    % Wound base Yellow/Fibrinous Exudate  5%    Peri-wound Assessment  Erythema (blanchable)    Margins  Unattached edges (unapproximated)    Drainage Amount  Minimal    Drainage Description  Serosanguineous    Treatment  Cleansed;Debridement (Selective)    Selective Debridement - Location  wound on leg, epiboled edges of foot ulcer, callous surrounding wound    Selective Debridement - Tools Used  Forceps;Scalpel    Selective Debridement - Tissue Removed  callous, devitalized tissue    Wound Therapy - Clinical Statement  Patient's foot wound continues to progress gradually with improved approximation of wound bed and margins. Continued today with debridement to edges to promote further approximation. Wounds along anterior aspect of pt's Rt leg are improved with greater red non-granulating tissue and slough easily removed. Continued with xeroform to leg wounds, silver hydrofiber to diabetic foot ulcer and applied profore wrap to manage LE edema. Discussed possibility of patient wearing compression stocking to manage LE edema again. Reviewed importance of making appointment with orthotist for custom shoe to unweight diabetic ulcer to allow healing and on benefit of activity for overall strength and health.     Wound Therapy - Functional Problem List  difficulty walking; donning boot     Factors Delaying/Impairing Wound Healing  Altered sensation;Diabetes Mellitus;Multiple medical problems    Hydrotherapy Plan  Debridement;Dressing change;Patient/family education    Wound Therapy - Frequency  2X / week   extend for 6 more weeks for a total of 15 weeks.    Wound Therapy - Current Recommendations  PT    Wound Plan  continue with appropriate woundcare; resume  PL if needed.  continue sharps debridment.  F/U on orthotic consult.    Dressing   Rt plantar foot:  Silver hydrofiber, ABD, Profore, netting #6    Dressing  3 lateral LE wounds:  Xeroform, gauze and profore    Manual Therapy  --    Modality  laser: to plantar Rt foot to increase circulation, chronic continuous 2 places overlapping 50%, 75 Joules 1:45" each spot.         PT Short Term Goals - 11/03/18 1356      PT SHORT TERM GOAL #1   Title  PT to verbalize the signs and sx of cellulitis and to contact MD  ASAP     Time  1    Period  Weeks    Status  Achieved      PT SHORT TERM GOAL #2   Title  PT callous to be removed from wound to allow decrease pressure to allow wound to heal     Time  3    Period  Weeks    Status  Achieved      PT SHORT TERM GOAL #3   Title  wound drainage to decrease to scant to min to allow pt to be able to don socks without soiling     Time  3    Period  Weeks    Status  On-going      PT SHORT TERM GOAL #4   Title  epiboled edges to be gone to allow wound to begin healing     Time  3    Period  Weeks    Status  Partially Met        PT Long Term Goals - 11/03/18 1356      PT LONG TERM GOAL #1   Title  PT wound to have no depth to allow pt to be comfortable with self care     Time  6    Period  Weeks    Status  On-going      PT LONG TERM GOAL #2   Title  PT wound length and width to have decreased at least 1 cm to demonstrate a healing environment for pt to begin self care.     Time  6    Period  Weeks    Status  On-going      PT LONG TERM GOAL #3   Title  PT edema to have decreased to facilitate donning of boot.     Time  6    Period  Weeks    Status  Achieved        Plan - 01/05/19 1314    Clinical Impression Statement  see above    Rehab Potential  Fair    PT Frequency  2x / week    PT Duration  8 weeks    PT Treatment/Interventions  ADLs/Self Care Home Management;Patient/family education;Other (comment)   debridement/dressing  change, low level laser therapy   PT Next Visit Plan  see above    Consulted and Agree with Plan of Care  Patient       Patient will benefit from skilled therapeutic intervention in order to improve the following deficits and impairments:  Pain, Decreased skin integrity, Difficulty walking, Increased edema  Visit Diagnosis: 1. Open wound of right foot with complication, sequela   2. Localized edema   3. Pain in right foot        Problem List There are no active problems to display for this patient.   Kipp Brood, PT, DPT, [facility] Physical Therapist with [facility]  01/05/2019 1:16 PM    [place] [address] [place], [place], [address] Phone: [phone]   Fax:  [phone]  Name: Myra Weng MRN: [id number] Date of Birth: 07/17/1955

## 2019-01-07 NOTE — Addendum Note (Signed)
Addended by: Kipp Brood E on: 01/07/2019 12:10 PM   Modules accepted: Orders

## 2019-01-09 ENCOUNTER — Ambulatory Visit (HOSPITAL_COMMUNITY): Payer: Medicaid Other

## 2019-01-12 ENCOUNTER — Other Ambulatory Visit: Payer: Self-pay

## 2019-01-12 ENCOUNTER — Ambulatory Visit (HOSPITAL_COMMUNITY): Payer: Medicaid Other

## 2019-01-12 DIAGNOSIS — M79671 Pain in right foot: Secondary | ICD-10-CM

## 2019-01-12 DIAGNOSIS — S91301S Unspecified open wound, right foot, sequela: Secondary | ICD-10-CM

## 2019-01-12 DIAGNOSIS — R600 Localized edema: Secondary | ICD-10-CM

## 2019-01-12 NOTE — Therapy (Signed)
[place], [place], [address] Phone: [phone]   Fax:  [phone]  Wound Care Therapy  Patient Details  Name: Suzanne Tran MRN: [id number] Date of Birth: 12/11/1955 No data recorded  Encounter Date: 01/12/2019  PT End of Session - 01/12/19 1308    Visit Number  48    Number of Visits  63    Date for PT Re-Evaluation  01/16/19    Authorization Type  no insurance, self pay    Authorization Time Period  07/18/18-08/28/18; second: 08/21/18-09/25/18; third: 09/25/18-11/06/18; 11/03/18-12/05/18; 11/21/18-01/16/19    PT Start Time  1019    PT Stop Time  1105    PT Time Calculation (min)  46 min    Activity Tolerance  Patient tolerated treatment well    Behavior During Therapy  [facility] for tasks assessed/performed       Past Medical History:  Diagnosis Date  . Diabetes mellitus without complication ([place])   . Hypertension   . Renal disorder     Past Surgical History:  Procedure Laterality Date  . ANKLE SURGERY    . KNEE SURGERY    . REPLACEMENT TOTAL KNEE      There were no vitals filed for this visit.    Wound Therapy - 01/12/19 1256    Subjective  Patient is doing alright. She states she has still not made an appointment for the orthotist yet becasue it was a hard week and she hasn't felt like it.    Patient and Family Stated Goals  wound to heal     Date of Onset  06/20/18    Prior Treatments  self care     Pain Scale  0-10    Pain Score  0-No pain    Evaluation and Treatment Procedures Explained to Patient/Family  Yes    Evaluation and Treatment Procedures  agreed to    Wound Properties Date First Assessed: 07/18/18 Time First Assessed: 0827 Wound Type: Diabetic ulcer Location: Foot Location Orientation: Right Wound Description (Comments): plantar aspect  Present on Admission: Yes   Dressing Type  Gauze (Comment);Abdominal pads;Silver hydrofiber    Dressing Changed  Changed    Dressing Status  Old drainage     Dressing Change Frequency  PRN    Site / Wound Assessment  Red    % Wound base Red or Granulating  95%    % Wound base Yellow/Fibrinous Exudate  0%    % Wound base Other/Granulation Tissue (Comment)  5%   callous perimeter   Peri-wound Assessment  Erythema (non-blanchable);Maceration    Margins  Epibole (rolled edges)   75%   Closure  None    Drainage Amount  Moderate    Drainage Description  Serosanguineous    Treatment  Cleansed;Debridement (Selective)   low lwevel laser therapy   Wound Properties Date First Assessed: 01/02/19 Time First Assessed: 1115 Wound Type: Other (Comment) Location: Leg Location Orientation: Right Wound Description (Comments): most superior, largest Present on Admission: No   Dressing Type  None    Dressing Status  Old drainage    Dressing Change Frequency  PRN    Site / Wound Assessment  Red;Granulation tissue    % Wound base Red or Granulating  100%    % Wound base Yellow/Fibrinous Exudate  0%    Peri-wound Assessment  Erythema (blanchable)    Margins  Unattached edges (unapproximated)    Drainage Amount  Minimal    Drainage Description  Serosanguineous  Treatment  Cleansed    Wound Properties Date First Assessed: 01/02/19 Time First Assessed: 1115 Wound Type: Other (Comment) Location: Leg Location Orientation: Right Wound Description (Comments): most lateral Present on Admission: No   Dressing Type  None    Dressing Status  Clean;Dry;Intact    Dressing Change Frequency  PRN    Site / Wound Assessment  Yellow;Pale    % Wound base Red or Granulating  100%    % Wound base Yellow/Fibrinous Exudate  0%    Peri-wound Assessment  Erythema (blanchable)    Drainage Amount  Scant    Drainage Description  Serous    Treatment  Cleansed    Wound Properties Date First Assessed: 01/02/19 Time First Assessed: 1115 Wound Type: Other (Comment) Location: Leg Location Orientation: Right Wound Description (Comments): beneath largest   Dressing Type  None    Dressing  Status  Old drainage    Dressing Change Frequency  PRN    % Wound base Red or Granulating  100%    % Wound base Yellow/Fibrinous Exudate  0%    Peri-wound Assessment  Erythema (blanchable)    Margins  Unattached edges (unapproximated)    Drainage Amount  Minimal    Drainage Description  Serosanguineous    Treatment  Cleansed    Selective Debridement - Location  epiboled edges of foot ulcer, callous surrounding wound    Selective Debridement - Tools Used  Forceps;Scalpel    Selective Debridement - Tissue Removed  callous, devitalized tissue    Wound Therapy - Clinical Statement  Patient's leg wounds appear to have approximated fully with new skin, but remain fragile. Cleansed them and moisturized. Patient's foot wound has increased callous build up since last visit. Wound bed continues to fill in inferiorly more than superiorly with signs of reduced width and reduced depth in superior wound bed; will measure next session. Continued to debride margins to address epioboled edges and low level laser continued to stimulate healing processes in wound bed. Wrapped pt's LE with profore to manage edema and continued to discuss importance of making appointment with orthotist for custom shoe to unweight diabetic ulcer to allow healing. She will continue to benefit from skilled PT interventions to promote healing.     Wound Therapy - Functional Problem List  difficulty walking; donning boot     Factors Delaying/Impairing Wound Healing  Altered sensation;Diabetes Mellitus;Multiple medical problems    Hydrotherapy Plan  Debridement;Dressing change;Patient/family education    Wound Therapy - Frequency  2X / week   extend for 6 more weeks for a total of 15 weeks.    Wound Therapy - Current Recommendations  PT    Wound Plan  continue with appropriate woundcare; resume PL if needed.  continue sharps debridment.  F/U on orthotic consult.    Dressing   Rt plantar foot:  Silver hydrofiber, ABD, Profore, netting #6     Dressing  3 lateral LE wounds:  Xeroform, gauze and profore    Modality  laser: to plantar Rt foot to increase circulation, chronic continuous 2 places overlapping 50%, 75 Joules 1:45" each spot.          PT Short Term Goals - 11/03/18 1356      PT SHORT TERM GOAL #1   Title  PT to verbalize the signs and sx of cellulitis and to contact MD ASAP     Time  1    Period  Weeks    Status  Achieved  PT SHORT TERM GOAL #2   Title  PT callous to be removed from wound to allow decrease pressure to allow wound to heal     Time  3    Period  Weeks    Status  Achieved      PT SHORT TERM GOAL #3   Title  wound drainage to decrease to scant to min to allow pt to be able to don socks without soiling     Time  3    Period  Weeks    Status  On-going      PT SHORT TERM GOAL #4   Title  epiboled edges to be gone to allow wound to begin healing     Time  3    Period  Weeks    Status  Partially Met        PT Long Term Goals - 11/03/18 1356      PT LONG TERM GOAL #1   Title  PT wound to have no depth to allow pt to be comfortable with self care     Time  6    Period  Weeks    Status  On-going      PT LONG TERM GOAL #2   Title  PT wound length and width to have decreased at least 1 cm to demonstrate a healing environment for pt to begin self care.     Time  6    Period  Weeks    Status  On-going      PT LONG TERM GOAL #3   Title  PT edema to have decreased to facilitate donning of boot.     Time  6    Period  Weeks    Status  Achieved       Plan - 01/12/19 1308    Clinical Impression Statement  see above    Rehab Potential  Fair    PT Frequency  2x / week    PT Duration  8 weeks    PT Treatment/Interventions  ADLs/Self Care Home Management;Patient/family education;Other (comment)   debridement/dressing change, low level laser therapy   PT Next Visit Plan  see above    Consulted and Agree with Plan of Care  Patient       Patient will benefit from skilled  therapeutic intervention in order to improve the following deficits and impairments:  Pain, Decreased skin integrity, Difficulty walking, Increased edema  Visit Diagnosis: 1. Open wound of right foot with complication, sequela   2. Localized edema   3. Pain in right foot        Problem List There are no active problems to display for this patient.   Kipp Brood, PT, DPT, [facility] Physical Therapist with [facility]  01/12/2019 1:09 PM    [place], [place], [address] Phone: [phone]   Fax:  [phone]  Name: Suzanne Tran MRN: [id number] Date of Birth: [birth year]

## 2019-01-14 ENCOUNTER — Telehealth (HOSPITAL_COMMUNITY): Payer: Self-pay | Admitting: Internal Medicine

## 2019-01-14 ENCOUNTER — Ambulatory Visit (HOSPITAL_COMMUNITY): Payer: Medicaid Other

## 2019-01-14 NOTE — Telephone Encounter (Signed)
01/14/19  pt left a message to cx - has to go to Dr. at [facility] about her eye and her vision

## 2019-01-20 ENCOUNTER — Encounter (HOSPITAL_COMMUNITY): Payer: Self-pay | Admitting: Physical Therapy

## 2019-01-20 ENCOUNTER — Ambulatory Visit (HOSPITAL_COMMUNITY): Payer: Medicaid Other | Attending: Internal Medicine | Admitting: Physical Therapy

## 2019-01-20 ENCOUNTER — Other Ambulatory Visit: Payer: Self-pay

## 2019-01-20 DIAGNOSIS — S91301S Unspecified open wound, right foot, sequela: Secondary | ICD-10-CM | POA: Diagnosis present

## 2019-01-20 DIAGNOSIS — R600 Localized edema: Secondary | ICD-10-CM | POA: Diagnosis present

## 2019-01-20 DIAGNOSIS — M79671 Pain in right foot: Secondary | ICD-10-CM | POA: Diagnosis present

## 2019-01-20 NOTE — Therapy (Addendum)
[facility] [address] [place], [place], [address] Phone: [phone]   Fax:  [phone]  Wound Care Therapy Progress Note Reporting Period 12/19/2018  to  01/20/2019  See note below for Objective Data and Assessment of Progress/Goals.   I agree with the assessment and objective measures/findings from Roseanne Reno, PTA who provided the treatment and assessment this session.    Kipp Brood, PT, DPT, [facility] Physical Therapist with [facility]   Patient Details  Name: Suzanne Tran MRN: [id number] Date of Birth: [birth year] No data recorded  Encounter Date: 01/20/2019  PT End of Session - 01/20/19 1351    Visit Number  49    Number of Visits  83    Date for PT Re-Evaluation  01/16/19    Authorization Type  no insurance, self pay    Authorization Time Period  07/18/18-08/28/18; second: 08/21/18-09/25/18; third: 09/25/18-11/06/18; 11/03/18-12/05/18; 11/21/18-01/16/19; NEW: 01/20/19-02/17/19   PT Start Time  1130    PT Stop Time  1225    PT Time Calculation (min)  55 min    Activity Tolerance  Patient tolerated treatment well    Behavior During Therapy  [facility] for tasks assessed/performed       Past Medical History:  Diagnosis Date  . Diabetes mellitus without complication ([place])   . Hypertension   . Renal disorder     Past Surgical History:  Procedure Laterality Date  . ANKLE SURGERY    . KNEE SURGERY    . REPLACEMENT TOTAL KNEE      There were no vitals filed for this visit.              Wound Therapy - 01/20/19 1332    Subjective  pt states she has been so depressed and having panic attacks due to her low vision.  States she is having an eye procedure on August 5th unless they get a sooner cancellation.  States she still has not contacted the orthotist to R/S her appt as she is too worried about her eye and cannot afford it..States her leg has been uncomfortable due to her dressing sliding down and she does  not want the wrap up to her knee any longer.      Patient and Family Stated Goals  wound to heal     Date of Onset  06/20/18    Prior Treatments  self care     Pain Scale  0-10    Pain Score  0-No pain    Evaluation and Treatment Procedures Explained to Patient/Family  Yes    Evaluation and Treatment Procedures  agreed to    Wound Properties Date First Assessed: 07/18/18 Time First Assessed: 0827 Wound Type: Diabetic ulcer Location: Foot Location Orientation: Right Wound Description (Comments): plantar aspect  Present on Admission: Yes   Dressing Type  Gauze (Comment);Abdominal pads;Silver hydrofiber    Dressing Changed  Changed    Dressing Status  Old drainage    Dressing Change Frequency  PRN    Site / Wound Assessment  Red    % Wound base Red or Granulating  95%    % Wound base Yellow/Fibrinous Exudate  0%    % Wound base Other/Granulation Tissue (Comment)  5%   callous perimeter   Peri-wound Assessment  Erythema (non-blanchable);Maceration    Wound Length (cm)  1.8 cm   was 1 cm on 6/19   Wound Width (cm)  0.5 cm   was 0.5   Wound Depth (  cm)  0.5 cm   was 0.5   Wound Volume (cm^3)  0.45 cm^3    Wound Surface Area (cm^2)  0.9 cm^2    Margins  Epibole (rolled edges)   75%   Closure  None    Drainage Amount  Moderate    Drainage Description  Serosanguineous    Treatment  Cleansed;Debridement (Selective)    Wound Properties Date First Assessed: 01/02/19 Time First Assessed: 1115 Wound Type: Other (Comment) Location: Leg Location Orientation: Right Wound Description (Comments): most superior, largest Present on Admission: No Final Assessment Date: 01/20/19   Dressing Type  --    Dressing Changed  --    Dressing Status  --    Dressing Change Frequency  --    Site / Wound Assessment  --    % Wound base Red or Granulating  --    % Wound base Yellow/Fibrinous Exudate  --    Peri-wound Assessment  --    Wound Length (cm)  --    Wound Width (cm)  --    Wound Depth (cm)  --     Wound Volume (cm^3)  --    Wound Surface Area (cm^2)  --    Margins  --    Drainage Amount  --    Drainage Description  --    Treatment  --    Wound Properties Date First Assessed: 01/02/19 Time First Assessed: 1115 Wound Type: Other (Comment) Location: Leg Location Orientation: Right Wound Description (Comments): most lateral Present on Admission: No   Dressing Type  None    Dressing Status  Clean;Dry;Intact    Dressing Change Frequency  PRN    Site / Wound Assessment  Yellow;Pale    % Wound base Red or Granulating  100%    % Wound base Yellow/Fibrinous Exudate  0%    Peri-wound Assessment  Erythema (blanchable)    Wound Length (cm)  0.5 cm   was 1 cm on 6/19   Wound Width (cm)  0.5 cm   was 1 cm   Wound Depth (cm)  0.1 cm   was 0.2 cm   Wound Volume (cm^3)  0.02 cm^3    Wound Surface Area (cm^2)  0.25 cm^2    Drainage Amount  Scant    Drainage Description  Serous    Treatment  Cleansed;Debridement (Selective)    Wound Properties Date First Assessed: 01/02/19 Time First Assessed: 1115 Wound Type: Other (Comment) Location: Leg Location Orientation: Right Wound Description (Comments): beneath largest Final Assessment Date: 01/20/19   Dressing Type  --    Dressing Status  --    Dressing Change Frequency  --    % Wound base Red or Granulating  --    % Wound base Yellow/Fibrinous Exudate  --    Peri-wound Assessment  --    Margins  --    Drainage Amount  --    Drainage Description  --    Selective Debridement - Location  epiboled edges of foot ulcer, callous surrounding wound    Selective Debridement - Tools Used  Forceps;Scalpel    Selective Debridement - Tissue Removed  callous, devitalized tissue    Wound Therapy - Clinical Statement  Bottlenecking of Rt LE due to profore falling half way down leg and resultant swelling above.  All wounds on LE healed with exception of most lateral one but with reduced size and drainage.  Debrided dry edeges to promote further approximation.   Wound plantar surface with increased length  and no change in width and depth.  Callous formation debrided away via scapel to shave off devitatlized layers and promote approximation.  Continued with laser on this area and silver hydrofiber.  No profore used as pt refused it.  Expressed concerns regarding swelling and will need to begin wearing compression once wound healed on plantar surface and need to keep LE moisturized and not scratch it.  Also expressed concerns with need to get new orthotic as when the wound heals if she continues wearing what she has now the wound will return.  Also explained this is preventing her from increasing her activity, thus improving her physical and mental health.  Pt would benefit from strengthening exercises moving forward that she can complete supine or sitting.  Pt gets short of breath with simply donning her sock and shoe.      Wound Therapy - Functional Problem List  difficulty walking; donning boot     Factors Delaying/Impairing Wound Healing  Altered sensation;Diabetes Mellitus;Multiple medical problems    Hydrotherapy Plan  Debridement;Dressing change;Patient/family education    Wound Therapy - Frequency  2X / week   extend for 4 more weeks.    Wound Therapy - Current Recommendations  PT    Wound Plan  continue with appropriate woundcare; resume PL if needed.  continue sharps debridment.  F/U on orthotic consult.  Give LE strengthening exercises next session.      Dressing   Rt plantar foot:  Silver hydrofiber, ABD, kerlix and #6 netting    Dressing  lateral LE wound:  Xeroform, gauze and medipore    Modality  laser: to plantar Rt foot to increase circulation, chronic continuous 2 places overlapping 50%, 75 Joules 1:45" each spot.                 PT Short Term Goals - 11/03/18 1356      PT SHORT TERM GOAL #1   Title  PT to verbalize the signs and sx of cellulitis and to contact MD ASAP     Time  1    Period  Weeks    Status  Achieved      PT  SHORT TERM GOAL #2   Title  PT callous to be removed from wound to allow decrease pressure to allow wound to heal     Time  3    Period  Weeks    Status  Achieved      PT SHORT TERM GOAL #3   Title  wound drainage to decrease to scant to min to allow pt to be able to don socks without soiling     Time  3    Period  Weeks    Status  On-going      PT SHORT TERM GOAL #4   Title  epiboled edges to be gone to allow wound to begin healing     Time  3    Period  Weeks    Status  Partially Met        PT Long Term Goals - 11/03/18 1356      PT LONG TERM GOAL #1   Title  PT wound to have no depth to allow pt to be comfortable with self care     Time  6    Period  Weeks    Status  On-going      PT LONG TERM GOAL #2   Title  PT wound length and width to have decreased at least 1 cm  to demonstrate a healing environment for pt to begin self care.     Time  6    Period  Weeks    Status  On-going      PT LONG TERM GOAL #3   Title  PT edema to have decreased to facilitate donning of boot.     Time  6    Period  Weeks    Status  Achieved              Patient will benefit from skilled therapeutic intervention in order to improve the following deficits and impairments:     Visit Diagnosis: 1. Localized edema   2. Pain in right foot   3. Open wound of right foot with complication, sequela        Problem List There are no active problems to display for this patient.  [name], PTA/CLT [phone]  [name] 01/20/2019, 1:52 PM  [facility] [address] [place], [place], [address] Phone: [phone]   Fax:  [phone]  Name: Suzanne Tran MRN: [id number] Date of Birth: [birth year]

## 2019-01-26 ENCOUNTER — Other Ambulatory Visit: Payer: Self-pay

## 2019-01-26 ENCOUNTER — Ambulatory Visit (HOSPITAL_COMMUNITY): Payer: Medicaid Other

## 2019-01-26 DIAGNOSIS — R600 Localized edema: Secondary | ICD-10-CM

## 2019-01-26 DIAGNOSIS — S91301S Unspecified open wound, right foot, sequela: Secondary | ICD-10-CM

## 2019-01-26 DIAGNOSIS — M79671 Pain in right foot: Secondary | ICD-10-CM

## 2019-01-26 NOTE — Addendum Note (Signed)
Addended by: Jacques Navy on: 01/26/2019 08:53 AM   Modules accepted: Orders

## 2019-01-26 NOTE — Therapy (Signed)
[place], [place], [address] Phone: [phone]   Fax:  [phone]  Wound Care Therapy  Patient Details  Name: Suzanne Tran MRN: [id number] Date of Birth: 1956-03-01 No data recorded  Encounter Date: 01/26/2019  PT End of Session - 01/26/19 1248    Visit Number  50    Number of Visits  32    Date for PT Re-Evaluation  02/17/19    Authorization Type  no insurance, self pay    Authorization Time Period  07/18/18-08/28/18; second: 08/21/18-09/25/18; third: 09/25/18-11/06/18; 11/03/18-12/05/18; 11/21/18-01/16/19; NEW: 01/20/19-02/17/19    Authorization - Visit Number  2    Authorization - Number of Visits  10    PT Start Time  1017    PT Stop Time  1110    PT Time Calculation (min)  53 min    Activity Tolerance  Patient tolerated treatment well    Behavior During Therapy  [facility] for tasks assessed/performed       Past Medical History:  Diagnosis Date  . Diabetes mellitus without complication ([place])   . Hypertension   . Renal disorder     Past Surgical History:  Procedure Laterality Date  . ANKLE SURGERY    . KNEE SURGERY    . REPLACEMENT TOTAL KNEE      There were no vitals filed for this visit.   Subjective Assessment - 01/26/19 1212    Subjective  Pt stated she added coban to dressings due to drainage through dressings.  Pt stated her main concern right now with her vision.  Scheduled 02/18/19 for surgery unless they have a cancellation earlier.  No pain today                Wound Therapy - 01/26/19 1214    Subjective  Pt stated she added coban to dressings due to drainage through dressings.  Pt stated her main concern right now with her vision.  Scheduled 02/18/19 for surgery unless they have a cancellation earlier.  No pain today    Patient and Family Stated Goals  wound to heal     Date of Onset  06/20/18    Prior Treatments  self care     Pain Scale  0-10    Pain Score  0-No pain    Evaluation and  Treatment Procedures Explained to Patient/Family  Yes    Evaluation and Treatment Procedures  agreed to    Wound Properties Date First Assessed: 01/02/19 Time First Assessed: 1115 Wound Type: Other (Comment) Location: Leg Location Orientation: Right Wound Description (Comments): most lateral Present on Admission: No   Dressing Type  None    Dressing Changed  Changed    Dressing Status  Clean;Dry;Intact    Dressing Change Frequency  PRN    Site / Wound Assessment  Yellow;Pale    % Wound base Red or Granulating  100%    % Wound base Yellow/Fibrinous Exudate  0%    Drainage Amount  Scant    Drainage Description  Serous    Treatment  Cleansed    Wound Properties Date First Assessed: 07/18/18 Time First Assessed: 0827 Wound Type: Diabetic ulcer Location: Foot Location Orientation: Right Wound Description (Comments): plantar aspect  Present on Admission: Yes   Dressing Type  Gauze (Comment);Abdominal pads;Silver hydrofiber    Dressing Changed  Changed    Dressing Status  Old drainage    Dressing Change Frequency  PRN    Site / Wound Assessment  Red    % Wound base Red or Granulating  95%    % Wound base Yellow/Fibrinous Exudate  0%    % Wound base Black/Eschar  0%    % Wound base Other/Granulation Tissue (Comment)  5%   Macerated tissues all edges   Peri-wound Assessment  Erythema (non-blanchable);Maceration    Wound Length (cm)  1 cm   macerated edges, 3.6cm   Wound Width (cm)  0.8 cm   was .5; 3.6cm including macerated tissues   Wound Depth (cm)  0.8 cm   was .5   Wound Volume (cm^3)  0.64 cm^3    Wound Surface Area (cm^2)  0.8 cm^2    Margins  Epibole (rolled edges)   75%   Closure  None    Drainage Amount  Moderate    Drainage Description  Serosanguineous    Treatment  Cleansed;Debridement (Selective)    Selective Debridement - Location  epiboled edges of foot ulcer, callous surrounding wound    Selective Debridement - Tools Used  Forceps;Scalpel    Selective Debridement -  Tissue Removed  callous, devitalized tissue    Wound Therapy - Clinical Statement  Pt arrived with gauze and coban wrapped on foot only.  Noted edema present Rt LE.  Selective debridment with focus strickly on plantar aspect wound with removal of callous formation surrounding all edges to promote healing.  Continued with laser therapy wiht focus on improving circulation.  Lateral wound on Rt LE is almost healed, no debridement just cleansed and placed xeroform with gauze and medipore tape.  Discussion held with importance of proper orthotics to assist with pressure points once would is healed to keep it from returning.  Pt stated with her vision she is unable to read number or dial out, stated her brother plans to assist with calling later today.  Discussed benefits of compression hose for edema control as well.  No reports of pain through session.  Pt continues to become SOB just donn/doffing boot due to weakness.  Due to time, unable to given HEP for LE strengthening, plan to give next session.      Wound Therapy - Functional Problem List  difficulty walking; donning boot     Factors Delaying/Impairing Wound Healing  Altered sensation;Diabetes Mellitus;Multiple medical problems    Hydrotherapy Plan  Debridement;Dressing change;Patient/family education    Wound Therapy - Frequency  2X / week   extend 6 more weeks for total of 15 weeks   Wound Therapy - Current Recommendations  PT    Wound Plan  continue with appropriate woundcare; resume PL if needed.  continue sharps debridment.  F/U on orthotic consult.  Give LE strengthening exercises next session.      Dressing   Rt plantar foot:  Silver hydrofiber, ABD, kerlix and #6 netting    Dressing  lateral LE wound:  Xeroform, gauze and medipore                PT Short Term Goals - 11/03/18 1356      PT SHORT TERM GOAL #1   Title  PT to verbalize the signs and sx of cellulitis and to contact MD ASAP     Time  1    Period  Weeks    Status   Achieved      PT SHORT TERM GOAL #2   Title  PT callous to be removed from wound to allow decrease pressure to allow wound to heal     Time  3  Period  Weeks    Status  Achieved      PT SHORT TERM GOAL #3   Title  wound drainage to decrease to scant to min to allow pt to be able to don socks without soiling     Time  3    Period  Weeks    Status  On-going      PT SHORT TERM GOAL #4   Title  epiboled edges to be gone to allow wound to begin healing     Time  3    Period  Weeks    Status  Partially Met        PT Long Term Goals - 11/03/18 1356      PT LONG TERM GOAL #1   Title  PT wound to have no depth to allow pt to be comfortable with self care     Time  6    Period  Weeks    Status  On-going      PT LONG TERM GOAL #2   Title  PT wound length and width to have decreased at least 1 cm to demonstrate a healing environment for pt to begin self care.     Time  6    Period  Weeks    Status  On-going      PT LONG TERM GOAL #3   Title  PT edema to have decreased to facilitate donning of boot.     Time  6    Period  Weeks    Status  Achieved              Patient will benefit from skilled therapeutic intervention in order to improve the following deficits and impairments:     Visit Diagnosis: 1. Open wound of right foot with complication, sequela   2. Localized edema   3. Pain in right foot        Problem List There are no active problems to display for this patient.  [address] LPTA; [place]  Aldona Lento 01/26/2019, 12:49 PM  [place] [address] [place], [place], [address] Phone: [phone]   Fax:  [phone]  Name: Suzanne Tran MRN: [id number] Date of Birth: 07/07/1956

## 2019-01-29 ENCOUNTER — Other Ambulatory Visit: Payer: Self-pay

## 2019-01-29 ENCOUNTER — Ambulatory Visit (HOSPITAL_COMMUNITY): Payer: Medicaid Other

## 2019-01-29 ENCOUNTER — Encounter (HOSPITAL_COMMUNITY): Payer: Self-pay

## 2019-01-29 DIAGNOSIS — R600 Localized edema: Secondary | ICD-10-CM | POA: Diagnosis not present

## 2019-01-29 DIAGNOSIS — S91301S Unspecified open wound, right foot, sequela: Secondary | ICD-10-CM

## 2019-01-29 DIAGNOSIS — M79671 Pain in right foot: Secondary | ICD-10-CM

## 2019-01-29 NOTE — Therapy (Signed)
[place], [place], [address] Phone: [phone]   Fax:  [phone]  Wound Care Therapy  Patient Details  Name: Suzanne Tran MRN: [id number] Date of Birth: January 14, 1956 No data recorded  Encounter Date: 01/29/2019  PT End of Session - 01/29/19 1220    Visit Number  93    Number of Visits  77    Date for PT Re-Evaluation  02/17/19    Authorization Type  no insurance, self pay    Authorization Time Period  07/18/18-08/28/18; second: 08/21/18-09/25/18; third: 09/25/18-11/06/18; 11/03/18-12/05/18; 11/21/18-01/16/19; NEW: 01/20/19-02/17/19    Authorization - Visit Number  3    Authorization - Number of Visits  10    PT Start Time  1015    PT Stop Time  1058    PT Time Calculation (min)  43 min    Activity Tolerance  Patient tolerated treatment well    Behavior During Therapy  [facility] for tasks assessed/performed       Past Medical History:  Diagnosis Date  . Diabetes mellitus without complication ([place])   . Hypertension   . Renal disorder     Past Surgical History:  Procedure Laterality Date  . ANKLE SURGERY    . KNEE SURGERY    . REPLACEMENT TOTAL KNEE      There were no vitals filed for this visit.       Wound Therapy - 01/29/19 1210    Subjective  Patient arrives reporting [place]. She states the referral has to say "orthopedic shoe" rather than diabetic shoe for them to make it and she will need a new referral. Her eye surgery appointment has also been moved up to next Wednesday.     Patient and Family Stated Goals  wound to heal     Date of Onset  06/20/18    Prior Treatments  self care     Pain Scale  0-10    Pain Score  0-No pain    Evaluation and Treatment Procedures Explained to Patient/Family  Yes    Evaluation and Treatment Procedures  agreed to    Wound Properties Date First Assessed: 01/02/19 Time  First Assessed: 1115 Wound Type: Other (Comment) Location: Leg Location Orientation: Right Wound Description (Comments): most lateral Present on Admission: No Final Assessment Date: 01/29/19 Final Assessment Time: 1030   Dressing Type  None  (Pended)     Dressing Changed  Changed    Dressing Status  Clean;Dry;Intact  (Pended)     Dressing Change Frequency  PRN  (Pended)     Site / Wound Assessment  Yellow;Pale  (Pended)     % Wound base Red or Granulating  100%  (Pended)     % Wound base Yellow/Fibrinous Exudate  0%  (Pended)     Wound Length (cm)  0 cm    Wound Width (cm)  0 cm    Wound Depth (cm)  0 cm    Wound Volume (cm^3)  0 cm^3    Wound Surface Area (cm^2)  0 cm^2    Drainage Amount  Scant  (Pended)     Drainage Description  Serous  (Pended)     Wound Properties Date First Assessed: 07/18/18 Time First Assessed: 0827 Wound Type: Diabetic ulcer Location: Foot Location Orientation: Right Wound Description (Comments): plantar aspect  Present on Admission: Yes   Dressing Type  Gauze (Comment);Abdominal pads;Silver hydrofiber    Dressing Changed  Changed    Dressing Status  Old drainage    Dressing Change Frequency  PRN    Site / Wound Assessment  Red    % Wound base Red or Granulating  95%    % Wound base Yellow/Fibrinous Exudate  0%    % Wound base Black/Eschar  0%    % Wound base Other/Granulation Tissue (Comment)  5%   Macerated tissues all edges   Peri-wound Assessment  Erythema (non-blanchable);Maceration    Margins  Epibole (rolled edges)   75%   Closure  None    Drainage Amount  Moderate    Drainage Description  Serosanguineous    Treatment  Cleansed;Debridement (Selective)   low level laser therapy   Selective Debridement - Location  epiboled edges of foot ulcer, callous surrounding wound    Selective Debridement - Tools Used  Forceps;Scalpel    Selective Debridement - Tissue Removed  callous, devitalized tissue    Wound Therapy - Clinical Statement  Pt arrived with  dressing intact. She had a band-aid on her leg where she has a new scratch from her dog. Cleansed and applied xeroform to it. Wound on foot has substantial callous developed around open wound bed requiring significant selective debridement. [place]ough was easy to debride form wound bed and margins debrided to promote approximation. Laser treatment continued as well and wound dressed with silver hydrofiber and wrapped around foot and ankle only. Discussed orthopedic shoe referral need and will send referral form to MD to sign for patient to take to [facility] the office she has chosen to make an appointment at.     Wound Therapy - Functional Problem List  difficulty walking; donning boot     Factors Delaying/Impairing Wound Healing  Altered sensation;Diabetes Mellitus;Multiple medical problems    Hydrotherapy Plan  Debridement;Dressing change;Patient/family education    Wound Therapy - Frequency  2X / week   extend 6 more weeks for total of 15 weeks   Wound Therapy - Current Recommendations  PT    Wound Plan  continue with appropriate woundcare; resume PL if needed.  continue sharps debridment.  F/U on orthotic consult.  Give LE strengthening exercises next session.      Dressing   Rt plantar foot:  Silver hydrofiber, ABD, kerlix and #6 netting    Dressing  lateral LE wound:  Xeroform, gauze and medipore          PT Short Term Goals - 11/03/18 1356      PT SHORT TERM GOAL #1   Title  PT to verbalize the signs and sx of cellulitis and to contact MD ASAP     Time  1    Period  Weeks    Status  Achieved      PT SHORT TERM GOAL #2   Title  PT callous to be removed from wound to allow decrease pressure to allow wound to heal     Time  3    Period  Weeks    Status  Achieved      PT SHORT TERM GOAL #3   Title  wound drainage to decrease to scant to min to allow pt to be able to don socks without soiling     Time  3    Period  Weeks    Status  On-going      PT SHORT TERM GOAL #4   Title   epiboled edges to be gone to  allow wound to begin healing     Time  3    Period  Weeks    Status  Partially Met        PT Long Term Goals - 11/03/18 1356      PT LONG TERM GOAL #1   Title  PT wound to have no depth to allow pt to be comfortable with self care     Time  6    Period  Weeks    Status  On-going      PT LONG TERM GOAL #2   Title  PT wound length and width to have decreased at least 1 cm to demonstrate a healing environment for pt to begin self care.     Time  6    Period  Weeks    Status  On-going      PT LONG TERM GOAL #3   Title  PT edema to have decreased to facilitate donning of boot.     Time  6    Period  Weeks    Status  Achieved        Plan - 01/29/19 1220    Clinical Impression Statement  see above    Rehab Potential  Fair    PT Frequency  2x / week    PT Duration  8 weeks    PT Treatment/Interventions  ADLs/Self Care Home Management;Patient/family education;Other (comment)    PT Next Visit Plan  see above    Consulted and Agree with Plan of Care  Patient       Patient will benefit from skilled therapeutic intervention in order to improve the following deficits and impairments:  Pain, Decreased skin integrity, Difficulty walking, Increased edema  Visit Diagnosis: 1. Open wound of right foot with complication, sequela   2. Localized edema   3. Pain in right foot        Problem List There are no active problems to display for this patient.   Kipp Brood, PT, DPT, [facility] Physical Therapist with [facility]  01/29/2019 12:21 PM    [place] [address] [place], [place], [address] Phone: [phone]   Fax:  [phone]  Name: Glenyce Munns MRN: [id number] Date of Birth: 1956-04-12

## 2019-02-03 ENCOUNTER — Other Ambulatory Visit: Payer: Self-pay

## 2019-02-03 ENCOUNTER — Encounter (HOSPITAL_COMMUNITY): Payer: Self-pay | Admitting: Physical Therapy

## 2019-02-03 ENCOUNTER — Ambulatory Visit (HOSPITAL_COMMUNITY): Payer: Medicaid Other | Admitting: Physical Therapy

## 2019-02-03 DIAGNOSIS — R600 Localized edema: Secondary | ICD-10-CM | POA: Diagnosis not present

## 2019-02-03 DIAGNOSIS — S91301S Unspecified open wound, right foot, sequela: Secondary | ICD-10-CM

## 2019-02-03 DIAGNOSIS — M79671 Pain in right foot: Secondary | ICD-10-CM

## 2019-02-03 NOTE — Therapy (Signed)
[place], [place], [address] Phone: [phone]   Fax:  [phone]  Wound Care Therapy  Patient Details  Name: Suzanne Tran MRN: [id number] Date of Birth: 03-Jan-1956 No data recorded  Encounter Date: 02/03/2019  PT End of Session - 02/03/19 1502    Visit Number  45    Number of Visits  87    Date for PT Re-Evaluation  02/17/19    Authorization Type  no insurance, self pay    Authorization Time Period  07/18/18-08/28/18; second: 08/21/18-09/25/18; third: 09/25/18-11/06/18; 11/03/18-12/05/18; 11/21/18-01/16/19; NEW: 01/20/19-02/17/19    Authorization - Visit Number  4    Authorization - Number of Visits  10    PT Start Time  8786    PT Stop Time  1418    PT Time Calculation (min)  43 min    Activity Tolerance  Patient tolerated treatment well    Behavior During Therapy  [facility] for tasks assessed/performed       Past Medical History:  Diagnosis Date  . Diabetes mellitus without complication ([place])   . Hypertension   . Renal disorder     Past Surgical History:  Procedure Laterality Date  . ANKLE SURGERY    . KNEE SURGERY    . REPLACEMENT TOTAL KNEE      There were no vitals filed for this visit.              Wound Therapy - 02/03/19 1458    Subjective  states she still has not gotten the order from MD for [place] yet.  States she's having laser eye surgery tomorrow.     Patient and Family Stated Goals  wound to heal     Date of Onset  06/20/18    Prior Treatments  self care     Pain Scale  0-10    Pain Score  0-No pain    Evaluation and Treatment Procedures Explained to Patient/Family  Yes    Evaluation and Treatment Procedures  agreed to    Wound Properties Date First Assessed: 07/18/18 Time First Assessed: 0827 Wound Type: Diabetic ulcer Location: Foot Location Orientation: Right Wound Description (Comments): plantar aspect  Present on Admission: Yes   Dressing Type  Gauze (Comment);Abdominal pads;Silver  hydrofiber    Dressing Status  Old drainage    Dressing Change Frequency  PRN    Site / Wound Assessment  Red    % Wound base Red or Granulating  95%    % Wound base Yellow/Fibrinous Exudate  0%    % Wound base Black/Eschar  0%    % Wound base Other/Granulation Tissue (Comment)  5%   Macerated tissues all edges   Peri-wound Assessment  Erythema (non-blanchable);Maceration    Wound Length (cm)  1 cm    Wound Width (cm)  0.8 cm    Wound Depth (cm)  0.8 cm    Wound Volume (cm^3)  0.64 cm^3    Wound Surface Area (cm^2)  0.8 cm^2    Margins  Epibole (rolled edges)   75%   Closure  None    Drainage Amount  Moderate    Drainage Description  Serosanguineous    Treatment  Cleansed;Debridement (Selective)    Selective Debridement - Location  epiboled edges of foot ulcer, callous surrounding wound    Selective Debridement - Tools Used  Forceps;Scalpel    Selective Debridement - Tissue Removed  callous, devitalized tissue    Wound Therapy - Clinical Statement  remeasured with not change in size as compared to last weeks measurements.  Debrided edges heavily via sharps removning epibole edges/callous to promote approximation.  Cleansed and moisturized foot prior to redressing.  continued with silver hydrofiber.     Wound Therapy - Functional Problem List  difficulty walking; donning boot     Factors Delaying/Impairing Wound Healing  Altered sensation;Diabetes Mellitus;Multiple medical problems    Hydrotherapy Plan  Debridement;Dressing change;Patient/family education    Wound Therapy - Frequency  2X / week   extend 6 more weeks for total of 15 weeks   Wound Therapy - Current Recommendations  PT    Wound Plan  continue with appropriate woundcare; resume PL if needed.  continue sharps debridment.  F/U on orthotic consult.      Dressing   Rt plantar foot:  Silver hydrofiber, ABD, kerlix and #6 netting    Dressing  lateral LE wound:  Xeroform, gauze and medipore                PT Short  Term Goals - 11/03/18 1356      PT SHORT TERM GOAL #1   Title  PT to verbalize the signs and sx of cellulitis and to contact MD ASAP     Time  1    Period  Weeks    Status  Achieved      PT SHORT TERM GOAL #2   Title  PT callous to be removed from wound to allow decrease pressure to allow wound to heal     Time  3    Period  Weeks    Status  Achieved      PT SHORT TERM GOAL #3   Title  wound drainage to decrease to scant to min to allow pt to be able to don socks without soiling     Time  3    Period  Weeks    Status  On-going      PT SHORT TERM GOAL #4   Title  epiboled edges to be gone to allow wound to begin healing     Time  3    Period  Weeks    Status  Partially Met        PT Long Term Goals - 11/03/18 1356      PT LONG TERM GOAL #1   Title  PT wound to have no depth to allow pt to be comfortable with self care     Time  6    Period  Weeks    Status  On-going      PT LONG TERM GOAL #2   Title  PT wound length and width to have decreased at least 1 cm to demonstrate a healing environment for pt to begin self care.     Time  6    Period  Weeks    Status  On-going      PT LONG TERM GOAL #3   Title  PT edema to have decreased to facilitate donning of boot.     Time  6    Period  Weeks    Status  Achieved              Patient will benefit from skilled therapeutic intervention in order to improve the following deficits and impairments:     Visit Diagnosis: 1. Localized edema   2. Open wound of right foot with complication, sequela   3. Pain in right foot          Problem List There are no active problems to display for this patient.  .Amy B Frazier, PTA/CLT [phone]  Frazier, Amy B 02/03/2019, 3:03 PM  [facility] [address] [place], [place], [address] Phone: [phone]   Fax:  [phone]  Name: Suzanne Tran MRN: [id number] Date of Birth: 05/12/1956    

## 2019-02-06 ENCOUNTER — Ambulatory Visit (HOSPITAL_COMMUNITY): Payer: Medicaid Other

## 2019-02-06 ENCOUNTER — Encounter (HOSPITAL_COMMUNITY): Payer: Self-pay

## 2019-02-06 ENCOUNTER — Other Ambulatory Visit: Payer: Self-pay

## 2019-02-06 DIAGNOSIS — R600 Localized edema: Secondary | ICD-10-CM

## 2019-02-06 DIAGNOSIS — S91301S Unspecified open wound, right foot, sequela: Secondary | ICD-10-CM

## 2019-02-06 DIAGNOSIS — M79671 Pain in right foot: Secondary | ICD-10-CM

## 2019-02-06 NOTE — Therapy (Signed)
[place], [place], [address] Phone: [phone]   Fax:  [phone]  Wound Care Therapy  Patient Details  Name: Suzanne Tran MRN: [id number] Date of Birth: 05/08/1956 No data recorded  Encounter Date: 02/06/2019  PT End of Session - 02/06/19 1518    Visit Number  73    Number of Visits  81    Date for PT Re-Evaluation  02/17/19    Authorization Type  no insurance, self pay    Authorization Time Period  07/18/18-08/28/18; second: 08/21/18-09/25/18; third: 09/25/18-11/06/18; 11/03/18-12/05/18; 11/21/18-01/16/19; NEW: 01/20/19-02/17/19    Authorization - Visit Number  5    Authorization - Number of Visits  10    PT Start Time  4034    PT Stop Time  1505    PT Time Calculation (min)  50 min    Activity Tolerance  Patient tolerated treatment well    Behavior During Therapy  [facility] for tasks assessed/performed       Past Medical History:  Diagnosis Date  . Diabetes mellitus without complication ([place])   . Hypertension   . Renal disorder     Past Surgical History:  Procedure Laterality Date  . ANKLE SURGERY    . KNEE SURGERY    . REPLACEMENT TOTAL KNEE      There were no vitals filed for this visit.     Wound Therapy - 02/06/19 1511    Subjective  Patient reports they did not do teh eye surgery for her because of possible risks since she has bil impaired vision. She did go to her appointment at [facility] and was fitted for the orthotic and shoe to unweight her wound properly. She believes it will be ready in ~ 2 weeks.     Patient and Family Stated Goals  wound to heal     Date of Onset  06/20/18    Prior Treatments  self care     Pain Scale  0-10    Pain Score  0-No pain    Evaluation and Treatment Procedures Explained to Patient/Family  Yes    Evaluation and Treatment Procedures  agreed to    Wound Properties Date First Assessed: 07/18/18 Time First Assessed: 0827 Wound Type: Diabetic ulcer Location: Foot Location  Orientation: Right Wound Description (Comments): plantar aspect  Present on Admission: Yes   Dressing Type  Gauze (Comment);Abdominal pads;Silver hydrofiber    Dressing Changed  Changed    Dressing Status  Old drainage    Dressing Change Frequency  PRN    Site / Wound Assessment  Red    % Wound base Red or Granulating  95%    % Wound base Yellow/Fibrinous Exudate  0%    % Wound base Black/Eschar  0%    % Wound base Other/Granulation Tissue (Comment)  5%   Macerated tissues all edges   Peri-wound Assessment  Erythema (non-blanchable);Maceration    Margins  Epibole (rolled edges)   75%   Closure  None    Drainage Amount  Moderate    Drainage Description  Serosanguineous    Treatment  Cleansed;Debridement (Selective)   low level laser therapy   Selective Debridement - Location  epiboled edges of foot ulcer, callous surrounding wound    Selective Debridement - Tools Used  Forceps;Scalpel    Selective Debridement - Tissue Removed  callous, devitalized tissue    Wound Therapy - Clinical Statement  Patient's wound continues to build substantial callous around margins requiring debridement  this date. There appears to be good approximation at inferior margins but the superior margin has not showed improvement in attaching to wound bed. Continued with low level laser therapy and applied silver hydrofiber and ABD to absorb drainage. Pt had small scrapes on her lower leg from her dog and they were cleansed and xeroform applied to openings. She will continue to benefit from skilled wound care to promote healing.     Wound Therapy - Functional Problem List  difficulty walking; donning boot     Factors Delaying/Impairing Wound Healing  Altered sensation;Diabetes Mellitus;Multiple medical problems    Hydrotherapy Plan  Debridement;Dressing change;Patient/family education    Wound Therapy - Frequency  2X / week   extend 6 more weeks for total of 15 weeks   Wound Therapy - Current Recommendations  PT     Wound Plan  continue with appropriate woundcare; resume PL if needed.  continue sharps debridment.  F/U on orthotic consult.      Dressing   Rt plantar foot:  Silver hydrofiber, ABD, kerlix and #6 netting    Dressing  lateral LE wound:  Xeroform, gauze and medipore    Modality  laser: to plantar Rt foot to increase circulation, chronic continuous 2 places overlapping 50%, 75 Joules 1:45" each spot.           PT Short Term Goals - 11/03/18 1356      PT SHORT TERM GOAL #1   Title  PT to verbalize the signs and sx of cellulitis and to contact MD ASAP     Time  1    Period  Weeks    Status  Achieved      PT SHORT TERM GOAL #2   Title  PT callous to be removed from wound to allow decrease pressure to allow wound to heal     Time  3    Period  Weeks    Status  Achieved      PT SHORT TERM GOAL #3   Title  wound drainage to decrease to scant to min to allow pt to be able to don socks without soiling     Time  3    Period  Weeks    Status  On-going      PT SHORT TERM GOAL #4   Title  epiboled edges to be gone to allow wound to begin healing     Time  3    Period  Weeks    Status  Partially Met        PT Long Term Goals - 11/03/18 1356      PT LONG TERM GOAL #1   Title  PT wound to have no depth to allow pt to be comfortable with self care     Time  6    Period  Weeks    Status  On-going      PT LONG TERM GOAL #2   Title  PT wound length and width to have decreased at least 1 cm to demonstrate a healing environment for pt to begin self care.     Time  6    Period  Weeks    Status  On-going      PT LONG TERM GOAL #3   Title  PT edema to have decreased to facilitate donning of boot.     Time  6    Period  Weeks    Status  Achieved        Plan -  02/06/19 1518    Clinical Impression Statement  see above    PT Frequency  2x / week    PT Duration  8 weeks    PT Treatment/Interventions  ADLs/Self Care Home Management;Patient/family education;Other (comment)    PT  Next Visit Plan  see above    Consulted and Agree with Plan of Care  Patient       Patient will benefit from skilled therapeutic intervention in order to improve the following deficits and impairments:  Pain, Decreased skin integrity, Difficulty walking, Increased edema  Visit Diagnosis: 1. Localized edema   2. Open wound of right foot with complication, sequela   3. Pain in right foot        Problem List There are no active problems to display for this patient.   Kipp Brood, PT, DPT, [facility] Physical Therapist with [facility]  02/06/2019 3:19 PM    [place], [place], [address] Phone: [phone]   Fax:  [phone]  Name: Ludell Zacarias MRN: [id number] Date of Birth: 04/07/1956

## 2019-02-10 ENCOUNTER — Encounter (HOSPITAL_COMMUNITY): Payer: Self-pay

## 2019-02-10 ENCOUNTER — Ambulatory Visit (HOSPITAL_COMMUNITY): Payer: Medicaid Other

## 2019-02-10 ENCOUNTER — Other Ambulatory Visit: Payer: Self-pay

## 2019-02-10 DIAGNOSIS — S91301S Unspecified open wound, right foot, sequela: Secondary | ICD-10-CM

## 2019-02-10 DIAGNOSIS — M79671 Pain in right foot: Secondary | ICD-10-CM

## 2019-02-10 DIAGNOSIS — R600 Localized edema: Secondary | ICD-10-CM | POA: Diagnosis not present

## 2019-02-10 NOTE — Therapy (Signed)
[place], [place], [address] Phone: [phone]   Fax:  [phone]  Wound Care Therapy  Patient Details  Name: Quinisha Mould MRN: [id number] Date of Birth: 07/01/1956 No data recorded  Encounter Date: 02/10/2019  PT End of Session - 02/10/19 1112    Visit Number  21    Number of Visits  46    Date for PT Re-Evaluation  02/17/19    Authorization Type  no insurance, self pay    Authorization Time Period  07/18/18-08/28/18; second: 08/21/18-09/25/18; third: 09/25/18-11/06/18; 11/03/18-12/05/18; 11/21/18-01/16/19; NEW: 01/20/19-02/17/19    Authorization - Visit Number  6    Authorization - Number of Visits  10    PT Start Time  1022    PT Stop Time  1108    PT Time Calculation (min)  46 min    Activity Tolerance  Patient tolerated treatment well    Behavior During Therapy  [facility] for tasks assessed/performed       Past Medical History:  Diagnosis Date  . Diabetes mellitus without complication ([place])   . Hypertension   . Renal disorder     Past Surgical History:  Procedure Laterality Date  . ANKLE SURGERY    . KNEE SURGERY    . REPLACEMENT TOTAL KNEE      There were no vitals filed for this visit.   Subjective Assessment - 02/10/19 1112    Subjective  Pt reports she had apt for orthotic, expects them next week.  Stated her main concern right now with her vision.                Wound Therapy - 02/10/19 1114    Subjective  Pt reports she had apt for orthotic, expects them next week.  Stated her main concern right now with her vision.    Patient and Family Stated Goals  wound to heal     Date of Onset  06/20/18    Prior Treatments  self care     Pain Scale  0-10    Pain Score  0-No pain    Evaluation and Treatment Procedures Explained to Patient/Family  Yes    Evaluation and Treatment Procedures  agreed to    Wound Properties Date First Assessed: 07/18/18 Time First Assessed: 0827 Wound Type: Diabetic ulcer  Location: Foot Location Orientation: Right Wound Description (Comments): plantar aspect  Present on Admission: Yes   Dressing Type  Gauze (Comment);Abdominal pads;Silver hydrofiber    Dressing Changed  Changed    Dressing Status  Old drainage    Dressing Change Frequency  PRN    Site / Wound Assessment  Red    % Wound base Red or Granulating  95%    % Wound base Yellow/Fibrinous Exudate  0%    % Wound base Black/Eschar  0%    % Wound base Other/Granulation Tissue (Comment)  5%   Macerated edges   Peri-wound Assessment  Erythema (non-blanchable);Maceration    Wound Length (cm)  1 cm   was 1   Wound Width (cm)  0.8 cm   was .8   Wound Depth (cm)  0.6 cm   was .8   Wound Volume (cm^3)  0.48 cm^3    Wound Surface Area (cm^2)  0.8 cm^2    Margins  Epibole (rolled edges)   75%   Closure  None    Drainage Amount  Moderate    Drainage Description  Serosanguineous    Treatment  Cleansed;Debridement (Selective)   low level laser therapy   Selective Debridement - Location  epiboled edges of foot ulcer, callous surrounding wound    Selective Debridement - Tools Used  Forceps;Scalpel    Selective Debridement - Tissue Removed  callous, devitalized tissue    Wound Therapy - Clinical Statement  Pt's wound continues to build substantial amount of callous perimeter of wound with moderate drainage.  Continued with low lever laser therapy and continued wiht silver hydrofiber and ABD for drainage.  Wound is improving with decreased depth.     Wound Therapy - Functional Problem List  difficulty walking; donning boot     Factors Delaying/Impairing Wound Healing  Altered sensation;Diabetes Mellitus;Multiple medical problems    Hydrotherapy Plan  Debridement;Dressing change;Patient/family education    Wound Therapy - Frequency  2X / week   extend 6 more weeks for total of 15 weeks   Wound Therapy - Current Recommendations  PT    Wound Plan  continue with appropriate woundcare; resume PL if needed.   continue sharps debridment.  F/U on orthotic consult.      Dressing   Rt plantar foot:  Silver hydrofiber, ABD, kerlix and #6 netting    Dressing  lateral LE wound: healed, no dressings    Modality  laser: to plantar Rt foot to increase circulation, chronic continuous 2 places overlapping 50%, 75 Joules 1:45" each spot.                 PT Short Term Goals - 11/03/18 1356      PT SHORT TERM GOAL #1   Title  PT to verbalize the signs and sx of cellulitis and to contact MD ASAP     Time  1    Period  Weeks    Status  Achieved      PT SHORT TERM GOAL #2   Title  PT callous to be removed from wound to allow decrease pressure to allow wound to heal     Time  3    Period  Weeks    Status  Achieved      PT SHORT TERM GOAL #3   Title  wound drainage to decrease to scant to min to allow pt to be able to don socks without soiling     Time  3    Period  Weeks    Status  On-going      PT SHORT TERM GOAL #4   Title  epiboled edges to be gone to allow wound to begin healing     Time  3    Period  Weeks    Status  Partially Met        PT Long Term Goals - 11/03/18 1356      PT LONG TERM GOAL #1   Title  PT wound to have no depth to allow pt to be comfortable with self care     Time  6    Period  Weeks    Status  On-going      PT LONG TERM GOAL #2   Title  PT wound length and width to have decreased at least 1 cm to demonstrate a healing environment for pt to begin self care.     Time  6    Period  Weeks    Status  On-going      PT LONG TERM GOAL #3   Title  PT edema to have decreased to facilitate donning of boot.  Time  6    Period  Weeks    Status  Achieved              Patient will benefit from skilled therapeutic intervention in order to improve the following deficits and impairments:     Visit Diagnosis: 1. Open wound of right foot with complication, sequela   2. Pain in right foot   3. Localized edema        Problem List There are no  active problems to display for this patient.  [address], LPTA; [place]  Aldona Lento 02/10/2019, 12:56 PM  [place] [address] [place], [place], [address] Phone: [phone]   Fax:  [phone]  Name: Junita Kubota MRN: [id number] Date of Birth: October 13, 1955

## 2019-02-13 ENCOUNTER — Other Ambulatory Visit: Payer: Self-pay

## 2019-02-13 ENCOUNTER — Encounter (HOSPITAL_COMMUNITY): Payer: Self-pay

## 2019-02-13 ENCOUNTER — Ambulatory Visit (HOSPITAL_COMMUNITY): Payer: Medicaid Other

## 2019-02-13 DIAGNOSIS — M79671 Pain in right foot: Secondary | ICD-10-CM

## 2019-02-13 DIAGNOSIS — R600 Localized edema: Secondary | ICD-10-CM

## 2019-02-13 DIAGNOSIS — S91301S Unspecified open wound, right foot, sequela: Secondary | ICD-10-CM

## 2019-02-13 NOTE — Therapy (Signed)
[place], [place], [address] Phone: [phone]   Fax:  [phone]  Wound Care Therapy  Patient Details  Name: Suzanne Tran MRN: [id number] Date of Birth: 1956-04-07 No data recorded  Encounter Date: 02/13/2019  PT End of Session - 02/13/19 1115    Visit Number  101    Number of Visits  36    Date for PT Re-Evaluation  02/17/19    Authorization Type  no insurance, self pay    Authorization Time Period  07/18/18-08/28/18; second: 08/21/18-09/25/18; third: 09/25/18-11/06/18; 11/03/18-12/05/18; 11/21/18-01/16/19; NEW: 01/20/19-02/17/19    Authorization - Visit Number  7    Authorization - Number of Visits  10    PT Start Time  1016    PT Stop Time  1102    PT Time Calculation (min)  46 min    Activity Tolerance  Patient tolerated treatment well    Behavior During Therapy  [facility] for tasks assessed/performed       Past Medical History:  Diagnosis Date  . Diabetes mellitus without complication ([place])   . Hypertension   . Renal disorder     Past Surgical History:  Procedure Laterality Date  . ANKLE SURGERY    . KNEE SURGERY    . REPLACEMENT TOTAL KNEE      There were no vitals filed for this visit.   Wound Therapy - 02/13/19 1108    Subjective  Patient reports she is doing alright today and expects to hear this week about her shoe.    Patient and Family Stated Goals  wound to heal     Date of Onset  06/20/18    Prior Treatments  self care     Pain Scale  0-10    Pain Score  0-No pain    Evaluation and Treatment Procedures Explained to Patient/Family  Yes    Evaluation and Treatment Procedures  agreed to    Wound Properties Date First Assessed: 07/18/18 Time First Assessed: 0827 Wound Type: Diabetic ulcer Location: Foot Location Orientation: Right Wound Description (Comments): plantar aspect  Present on Admission: Yes   Dressing Type  Gauze (Comment);Abdominal pads;Silver hydrofiber    Dressing Changed  Changed     Dressing Status  Old drainage    Dressing Change Frequency  PRN    Site / Wound Assessment  Red    % Wound base Red or Granulating  95%    % Wound base Yellow/Fibrinous Exudate  0%    % Wound base Black/Eschar  0%    % Wound base Other/Granulation Tissue (Comment)  5%   Macerated edges   Peri-wound Assessment  Erythema (non-blanchable);Maceration    Margins  Epibole (rolled edges)   75%   Closure  None    Drainage Amount  Moderate    Drainage Description  Serosanguineous    Treatment  Debridement (Selective);Cleansed   low level laser   Selective Debridement - Location  epiboled edges of foot ulcer, callous surrounding wound    Selective Debridement - Tools Used  Forceps;Scalpel    Selective Debridement - Tissue Removed  callous, devitalized tissue    Wound Therapy - Clinical Statement  Patient's wound continues to build significant callous and debridement continued to remove build up. Patient's wound bed appears to be reducing in depth gradually and has increased redness in inferior portion. Continued with low level laser therapy and dressed wound with silver hydrofiber and ABD as she continues to drain moderately.  Wound Therapy - Functional Problem List  difficulty walking; donning boot     Factors Delaying/Impairing Wound Healing  Altered sensation;Diabetes Mellitus;Multiple medical problems    Hydrotherapy Plan  Debridement;Dressing change;Patient/family education    Wound Therapy - Frequency  2X / week   extend 6 more weeks for total of 15 weeks   Wound Therapy - Current Recommendations  PT    Wound Plan  continue with appropriate woundcare; resume PL if needed.  continue sharps debridment.  F/U on orthotic consult.      Dressing   Rt plantar foot:  Silver hydrofiber, ABD, kerlix and #6 netting    Dressing  lateral LE wound: healed, no dressings    Modality  laser: to plantar Rt foot to increase circulation, chronic continuous 2 places overlapping 50%, 75 Joules 1:45" each spot.          PT Short Term Goals - 11/03/18 1356      PT SHORT TERM GOAL #1   Title  PT to verbalize the signs and sx of cellulitis and to contact MD ASAP     Time  1    Period  Weeks    Status  Achieved      PT SHORT TERM GOAL #2   Title  PT callous to be removed from wound to allow decrease pressure to allow wound to heal     Time  3    Period  Weeks    Status  Achieved      PT SHORT TERM GOAL #3   Title  wound drainage to decrease to scant to min to allow pt to be able to don socks without soiling     Time  3    Period  Weeks    Status  On-going      PT SHORT TERM GOAL #4   Title  epiboled edges to be gone to allow wound to begin healing     Time  3    Period  Weeks    Status  Partially Met        PT Long Term Goals - 11/03/18 1356      PT LONG TERM GOAL #1   Title  PT wound to have no depth to allow pt to be comfortable with self care     Time  6    Period  Weeks    Status  On-going      PT LONG TERM GOAL #2   Title  PT wound length and width to have decreased at least 1 cm to demonstrate a healing environment for pt to begin self care.     Time  6    Period  Weeks    Status  On-going      PT LONG TERM GOAL #3   Title  PT edema to have decreased to facilitate donning of boot.     Time  6    Period  Weeks    Status  Achieved        Plan - 02/13/19 1116    Clinical Impression Statement  see above    PT Frequency  2x / week    PT Duration  8 weeks    PT Treatment/Interventions  ADLs/Self Care Home Management;Patient/family education;Other (comment)    PT Next Visit Plan  see above    Consulted and Agree with Plan of Care  Patient       Patient will benefit from skilled therapeutic intervention in order to improve  the following deficits and impairments:  Pain, Decreased skin integrity, Difficulty walking, Increased edema  Visit Diagnosis: 1. Open wound of right foot with complication, sequela   2. Pain in right foot   3. Localized edema         Problem List There are no active problems to display for this patient.   Kipp Brood, PT, DPT, [facility] Physical Therapist with [facility]  02/13/2019 11:17 AM    [place] [address] [place], [place], [address] Phone: [phone]   Fax:  [phone]  Name: Suzanne Tran MRN: [id number] Date of Birth: [birth year]

## 2019-02-16 ENCOUNTER — Other Ambulatory Visit: Payer: Self-pay

## 2019-02-16 ENCOUNTER — Encounter (HOSPITAL_COMMUNITY): Payer: Self-pay | Admitting: Physical Therapy

## 2019-02-16 ENCOUNTER — Ambulatory Visit (HOSPITAL_COMMUNITY): Payer: Medicaid Other | Attending: Internal Medicine | Admitting: Physical Therapy

## 2019-02-16 DIAGNOSIS — R600 Localized edema: Secondary | ICD-10-CM | POA: Insufficient documentation

## 2019-02-16 DIAGNOSIS — M79671 Pain in right foot: Secondary | ICD-10-CM | POA: Insufficient documentation

## 2019-02-16 DIAGNOSIS — S91301S Unspecified open wound, right foot, sequela: Secondary | ICD-10-CM | POA: Insufficient documentation

## 2019-02-16 NOTE — Therapy (Signed)
[place], [place], [address] Phone: [phone]   Fax:  [phone]  Physical Therapy Treatment  Patient Details  Name: Suzanne Tran MRN: [id number] Date of Birth: June 07, 1956 No data recorded  Encounter Date: 02/16/2019  PT End of Session - 02/16/19 1229    Visit Number  76    Number of Visits  45    Date for PT Re-Evaluation  03/16/19    Authorization Type  no insurance, self pay    Authorization Time Period  07/18/18-08/28/18; second: 08/21/18-09/25/18; third: 09/25/18-11/06/18; 11/03/18-12/05/18; 11/21/18-01/16/19; NEW: 01/20/19-02/17/19; NEW 02/16/19 to 03/16/19    Authorization - Visit Number  8    Authorization - Number of Visits  10    PT Start Time  1749    PT Stop Time  1100    PT Time Calculation (min)  45 min    Activity Tolerance  Patient tolerated treatment well    Behavior During Therapy  [facility] for tasks assessed/performed       Past Medical History:  Diagnosis Date  . Diabetes mellitus without complication ([place])   . Hypertension   . Renal disorder     Past Surgical History:  Procedure Laterality Date  . ANKLE SURGERY    . KNEE SURGERY    . REPLACEMENT TOTAL KNEE      There were no vitals filed for this visit.                  Wound Therapy - 02/16/19 1221    Subjective  Doing Ok, still waiting to hear about shoe and expects to get it this week     Patient and Family Stated Goals  wound to heal     Date of Onset  06/20/18    Prior Treatments  self care     Pain Scale  0-10    Pain Score  0-No pain    Evaluation and Treatment Procedures Explained to Patient/Family  Yes    Evaluation and Treatment Procedures  agreed to    Wound Properties Date First Assessed: 07/18/18 Time First Assessed: 0827 Wound Type: Diabetic ulcer Location: Foot Location Orientation: Right Wound Description (Comments): plantar aspect  Present on Admission: Yes   Dressing Type  Abdominal pads;Gauze (Comment);Other  (Comment)   silver hydrofiber    Dressing Changed  Changed    Dressing Status  Old drainage    Dressing Change Frequency  PRN    Site / Wound Assessment  Red    % Wound base Red or Granulating  95%    % Wound base Yellow/Fibrinous Exudate  0%    % Wound base Black/Eschar  0%    % Wound base Other/Granulation Tissue (Comment)  5%   macerated edges    Peri-wound Assessment  Erythema (non-blanchable);Maceration    Wound Length (cm)  1 cm    Wound Width (cm)  0.9 cm    Wound Depth (cm)  0.4 cm    Wound Volume (cm^3)  0.36 cm^3    Wound Surface Area (cm^2)  0.9 cm^2    Margins  Epibole (rolled edges)    Closure  None    Drainage Amount  Moderate    Drainage Description  Serosanguineous    Treatment  Cleansed;Debridement (Selective);Packing (Impregnated strip)   silver in wound bed, xeroform on callous, gauze/tape    Selective Debridement - Location  epiboled edges, callous     Selective Debridement - Tools Used  Forceps;Scalpel  Selective Debridement - Tissue Removed  callous, devitalized tissue     Wound Therapy - Clinical Statement  Continued with use of laser to promote blood flow and promote healing in this region (not included in billing), then proceeded with debridement and wound dressings as appropriate. Placed xeroform over callous to attempt to improve ease of debridement of this tissue next session. Her wound continues to heal exquisitely slowly and will continue ongoing skilled wound care to promote tissue healing and prevent wound infection.    Wound Therapy - Functional Problem List  difficulty walking, donning boot     Factors Delaying/Impairing Wound Healing  Altered sensation;Diabetes Mellitus;Multiple medical problems    Wound Therapy - Frequency  2X / week    Wound Therapy - Current Recommendations  PT    Wound Plan  contiue with appropriate wound care; follow up on orthotic boot     Dressing   R plantar foot silver on wound bed, xeroform callous; ABD pad, gauze,  kerlix, netting     Dressing  lateral LE wound healed, no dressings                PT Education - 02/16/19 1228    Education Details  importance of regular skin/nail checks of feet especially with DM/PN, importance of following covid precautions and typical route of spread of covid virus, mechanism of masks, risks of contracting covid with severe DM, POC for further wound care, importance of orthotic shoe to facilitate wound healing    Person(s) Educated  Patient    Methods  Explanation    Comprehension  Verbalized understanding       PT Short Term Goals - 11/03/18 1356      PT SHORT TERM GOAL #1   Title  PT to verbalize the signs and sx of cellulitis and to contact MD ASAP     Time  1    Period  Weeks    Status  Achieved      PT SHORT TERM GOAL #2   Title  PT callous to be removed from wound to allow decrease pressure to allow wound to heal     Time  3    Period  Weeks    Status  Achieved      PT SHORT TERM GOAL #3   Title  wound drainage to decrease to scant to min to allow pt to be able to don socks without soiling     Time  3    Period  Weeks    Status  On-going      PT SHORT TERM GOAL #4   Title  epiboled edges to be gone to allow wound to begin healing     Time  3    Period  Weeks    Status  Partially Met        PT Long Term Goals - 11/03/18 1356      PT LONG TERM GOAL #1   Title  PT wound to have no depth to allow pt to be comfortable with self care     Time  6    Period  Weeks    Status  On-going      PT LONG TERM GOAL #2   Title  PT wound length and width to have decreased at least 1 cm to demonstrate a healing environment for pt to begin self care.     Time  6    Period  Weeks    Status  On-going  PT LONG TERM GOAL #3   Title  PT edema to have decreased to facilitate donning of boot.     Time  6    Period  Weeks    Status  Achieved              Patient will benefit from skilled therapeutic intervention in order to improve  the following deficits and impairments:     Visit Diagnosis: 1. Open wound of right foot with complication, sequela   2. Pain in right foot   3. Localized edema        Problem List There are no active problems to display for this patient.   Deniece Ree PT, DPT, CBIS  Supplemental Physical Therapist [facility]    Pager [phone] Acute Rehab Office [place] [address] [place], [place], [address] Phone: [phone]   Fax:  [phone]  Name: Joliet Mallozzi MRN: [id number] Date of Birth: 10/05/1955

## 2019-02-20 ENCOUNTER — Ambulatory Visit (HOSPITAL_COMMUNITY): Payer: Medicaid Other

## 2019-02-23 ENCOUNTER — Encounter (HOSPITAL_COMMUNITY): Payer: Self-pay | Admitting: Physical Therapy

## 2019-02-23 ENCOUNTER — Other Ambulatory Visit: Payer: Self-pay

## 2019-02-23 ENCOUNTER — Ambulatory Visit (HOSPITAL_COMMUNITY): Payer: Medicaid Other | Admitting: Physical Therapy

## 2019-02-23 DIAGNOSIS — R600 Localized edema: Secondary | ICD-10-CM

## 2019-02-23 DIAGNOSIS — M79671 Pain in right foot: Secondary | ICD-10-CM

## 2019-02-23 DIAGNOSIS — S91301S Unspecified open wound, right foot, sequela: Secondary | ICD-10-CM | POA: Diagnosis not present

## 2019-02-23 NOTE — Therapy (Signed)
[facility] [address] [place], [place], [address] Phone: [phone]   Fax:  [phone]  Wound Care Therapy  Patient Details  Name: Suzanne Tran MRN: [id number] Date of Birth: 09/03/1955 No data recorded  Encounter Date: 02/23/2019   Progress Note Reporting Period 12/19/18 to 02/23/19  See note below for Objective Data and Assessment of Progress/Goals.       PT End of Session - 02/23/19 1213    Visit Number  57   progress note done visit 57   Number of Visits  67    Date for PT Re-Evaluation  03/16/19    Authorization Type  no insurance, self pay    Authorization Time Period  07/18/18-08/28/18; second: 08/21/18-09/25/18; third: 09/25/18-11/06/18; 11/03/18-12/05/18; 11/21/18-01/16/19; NEW: 01/20/19-02/17/19; NEW 02/16/19 to 03/16/19    Authorization - Visit Number  9    Authorization - Number of Visits  10    PT Start Time  1015    PT Stop Time  1100    PT Time Calculation (min)  45 min    Activity Tolerance  Patient tolerated treatment well    Behavior During Therapy  [facility] for tasks assessed/performed       Past Medical History:  Diagnosis Date  . Diabetes mellitus without complication ([facility])   . Hypertension   . Renal disorder     Past Surgical History:  Procedure Laterality Date  . ANKLE SURGERY    . KNEE SURGERY    . REPLACEMENT TOTAL KNEE      There were no vitals filed for this visit.              Wound Therapy - 02/23/19 1200    Subjective  Went to [employer] for my shoe but it did not fit right and they are going to have to redo it; I'm annoyed because there was a whole second catalouge he didnt' even show me the first time and now I have to wait more time for my shoe. I don't think its going to do much honestly.     Patient and Family Stated Goals  wound to heal     Date of Onset  06/20/18    Prior Treatments  self care     Pain Scale  0-10    Pain Score  0-No pain    Evaluation and Treatment Procedures Explained to  Patient/Family  Yes    Evaluation and Treatment Procedures  agreed to    Wound Properties Date First Assessed: 07/18/18 Time First Assessed: 0827 Wound Type: Diabetic ulcer Location: Foot Location Orientation: Right Wound Description (Comments): plantar aspect  Present on Admission: Yes   Dressing Type  Abdominal pads;Silver hydrofiber;Other (Comment)   vasoline    Dressing Changed  Changed    Dressing Status  Old drainage    Dressing Change Frequency  PRN    Site / Wound Assessment  Red    % Wound base Red or Granulating  100%    % Wound base Yellow/Fibrinous Exudate  0%    % Wound base Black/Eschar  0%    % Wound base Other/Granulation Tissue (Comment)  0%   macerated edges    Peri-wound Assessment  Erythema (non-blanchable);Maceration    Wound Length (cm)  1 cm    Wound Width (cm)  0.9 cm    Wound Depth (cm)  0.4 cm    Wound Volume (cm^3)  0.36 cm^3    Wound Surface Area (cm^2)  0.9 cm^2      Margins  Epibole (rolled edges)    Closure  None    Drainage Amount  Moderate    Drainage Description  Serosanguineous    Treatment  Cleansed;Debridement (Selective);Packing (Impregnated strip);Other (Comment)   laser    Selective Debridement - Location  epidboled edges, callous     Selective Debridement - Tools Used  Forceps;Scalpel    Selective Debridement - Tissue Removed  callous, devitalized tissue     Wound Therapy - Clinical Statement  Patient arrives reporting frustration over the process of getting her orthotic shoe as there was a problem with how it fit and now she will have to wait an additional amount of time before she gets one that fits right; she reports that she really does not think that the shoe is going to work but she is willing to try. Continued with laser therapy on chronic continuous bone/joint setting; education regarding process of getting off loading shoe from [employer] and benefits of getting offloading shoe versus just continuing to wear her boot performed during laser  treatment. Otherwise continued debriding wound bed, primarily removing callous and noting multiple spots of bleeding during callous debridment. Dressed wound bed with silver hydrofiber and applied vasoline to callous to attempt to improve ease of debridement next session. Used ABD pad, gauze wrap, kerlix, and netting to complete dressing today.    Wound Therapy - Functional Problem List  difficulty walking, donning boot     Factors Delaying/Impairing Wound Healing  Altered sensation;Diabetes Mellitus;Multiple medical problems    Hydrotherapy Plan  Debridement;Dressing change;Other (comment)   laser    Wound Therapy - Frequency  2X / week    Wound Therapy - Current Recommendations  PT    Wound Plan  continue with appropriate wound care, follow up with orthotic shoe     Dressing   silver hydrofiber over wound bed, vasoline over calllous     Dressing  lateral LE wound healed, no dressings               PT Education - 02/23/19 1212    Education Details  benefit of getting formal, customized offloading shoe to assist in wound healing and how formal shoe is different from the boot she currently has; need for her to wear mask and that wearing only a face shield without mask does not provide adequate COVID-19 protection even for the layperson    Person(s) Educated  Patient    Methods  Explanation    Comprehension  Need further instruction       PT Short Term Goals - 02/23/19 1214      PT SHORT TERM GOAL #1   Title  PT to verbalize the signs and sx of cellulitis and to contact MD ASAP     Time  1    Period  Weeks    Status  Achieved    Target Date  07/25/18      PT SHORT TERM GOAL #2   Title  PT callous to be removed from wound to allow decrease pressure to allow wound to heal     Time  3    Period  Weeks    Status  On-going      PT SHORT TERM GOAL #3   Title  wound drainage to decrease to scant to min to allow pt to be able to don socks without soiling     Time  3    Period   Weeks    Status  On-going        PT SHORT TERM GOAL #4   Title  epiboled edges to be gone to allow wound to begin healing     Time  3    Period  Weeks    Status  Partially Met        PT Long Term Goals - 02/23/19 1215      PT LONG TERM GOAL #1   Title  PT wound to have no depth to allow pt to be comfortable with self care     Time  6    Period  Weeks    Status  On-going      PT LONG TERM GOAL #2   Title  PT wound length and width to have decreased at least 1 cm to demonstrate a healing environment for pt to begin self care.     Time  6    Period  Weeks    Status  Achieved      PT LONG TERM GOAL #3   Title  PT edema to have decreased to facilitate donning of boot.     Time  6    Period  Weeks    Status  Achieved              Patient will benefit from skilled therapeutic intervention in order to improve the following deficits and impairments:     Visit Diagnosis: 1. Open wound of right foot with complication, sequela   2. Pain in right foot   3. Localized edema        Problem List There are no active problems to display for this patient.   Deniece Ree PT, DPT, CBIS  Supplemental Physical Therapist [facility]    Pager [phone] Acute Rehab Office [place] [address] [place], [place], [address] Phone: [phone]   Fax:  [phone]  Name: Suzanne Tran MRN: [id number] Date of Birth: 1955/09/28

## 2019-02-26 ENCOUNTER — Encounter (HOSPITAL_COMMUNITY): Payer: Self-pay | Admitting: Physical Therapy

## 2019-02-26 ENCOUNTER — Other Ambulatory Visit: Payer: Self-pay

## 2019-02-26 ENCOUNTER — Ambulatory Visit (HOSPITAL_COMMUNITY): Payer: Medicaid Other | Admitting: Physical Therapy

## 2019-02-26 DIAGNOSIS — S91301S Unspecified open wound, right foot, sequela: Secondary | ICD-10-CM | POA: Diagnosis not present

## 2019-02-26 DIAGNOSIS — M79671 Pain in right foot: Secondary | ICD-10-CM

## 2019-02-26 DIAGNOSIS — R600 Localized edema: Secondary | ICD-10-CM

## 2019-02-26 NOTE — Therapy (Signed)
[place], [place], [address] Phone: [phone]   Fax:  [phone]  Wound Care Therapy  Patient Details  Name: Suzanne Tran MRN: [id number] Date of Birth: [birth year] No data recorded  Encounter Date: 02/26/2019  PT End of Session - 02/26/19 1509    Visit Number  21   progress note done visit 59   Number of Visits  79    Date for PT Re-Evaluation  03/16/19    Authorization Type  no insurance, self pay    Authorization Time Period  07/18/18-08/28/18; second: 08/21/18-09/25/18; third: 09/25/18-11/06/18; 11/03/18-12/05/18; 11/21/18-01/16/19; NEW: 01/20/19-02/17/19; NEW 02/16/19 to 03/16/19    PT Start Time  1415    PT Stop Time  1500    PT Time Calculation (min)  45 min    Activity Tolerance  Patient tolerated treatment well    Behavior During Therapy  [facility] for tasks assessed/performed       Past Medical History:  Diagnosis Date  . Diabetes mellitus without complication ([place])   . Hypertension   . Renal disorder     Past Surgical History:  Procedure Laterality Date  . ANKLE SURGERY    . KNEE SURGERY    . REPLACEMENT TOTAL KNEE      There were no vitals filed for this visit.              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  It is difficult for her to get rides to her appointment and requests to decrease the frequency of which she is seen.    Patient and Family Stated Goals  wound to heal     Date of Onset  06/20/18    Prior Treatments  self care     Pain Scale  0-10    Pain Score  0-No pain    Evaluation and Treatment Procedures Explained to Patient/Family  Yes    Evaluation and Treatment Procedures  agreed to    Wound Properties Date First Assessed: 07/18/18 Time First Assessed: 0827 Wound Type: Diabetic ulcer Location: Foot Location Orientation: Right Wound Description (Comments): plantar aspect  Present on Admission: Yes   Dressing Type  Abdominal pads;Silver hydrofiber;Other (Comment)   vasoline    Dressing Changed  Changed    Dressing Status  Old drainage    Dressing Change Frequency  PRN    Site / Wound Assessment  Red    % Wound base Red or Granulating  100%    % Wound base Yellow/Fibrinous Exudate  0%    % Wound base Black/Eschar  0%    % Wound base Other/Granulation Tissue (Comment)  0%   macerated edges    Peri-wound Assessment  Erythema (non-blanchable);Maceration    Margins  Epibole (rolled edges)    Closure  None    Drainage Amount  Moderate    Drainage Description  Serosanguineous    Treatment  Cleansed;Debridement (Selective)    Selective Debridement - Location  epidboled edges, callous     Selective Debridement - Tools Used  Forceps;Scissors    Selective Debridement - Tissue Removed  callous, devitalized tissue     Wound Therapy - Clinical Statement  Pt wound bed remains 100% granulated.  Callous and epiboled edges of wound debrided.  Patient and therapist agreed that pt will come back in two weeks if her wound continues to look well with self care  we will discharge pt to self care, if not we will see pt once every two weeks for debridment of the callous surrounding the wound .     Wound Therapy - Functional Problem List  difficulty walking, donning boot     Factors Delaying/Impairing Wound Healing  Altered sensation;Diabetes Mellitus;Multiple medical problems    Hydrotherapy Plan  Debridement;Dressing change;Other (comment)   laser    Wound Therapy - Frequency  Other (comment)    Wound Therapy - Current Recommendations  PT   once every two weeks.    Wound Plan  continue with appropriate wound care, follow up with orthotic shoe     Dressing   silver hydrofiber over wound bed, vasoline over calllous followed by 4x4      Dressing  secured with medipore tape.                 PT Short Term Goals - 02/23/19 1214      PT SHORT TERM GOAL #1   Title  PT to verbalize the signs and sx  of cellulitis and to contact MD ASAP     Time  1    Period  Weeks    Status  Achieved    Target Date  07/25/18      PT SHORT TERM GOAL #2   Title  PT callous to be removed from wound to allow decrease pressure to allow wound to heal     Time  3    Period  Weeks    Status  On-going      PT SHORT TERM GOAL #3   Title  wound drainage to decrease to scant to min to allow pt to be able to don socks without soiling     Time  3    Period  Weeks    Status  On-going      PT SHORT TERM GOAL #4   Title  epiboled edges to be gone to allow wound to begin healing     Time  3    Period  Weeks    Status  Partially Met        PT Long Term Goals - 02/23/19 1215      PT LONG TERM GOAL #1   Title  PT wound to have no depth to allow pt to be comfortable with self care     Time  6    Period  Weeks    Status  On-going      PT LONG TERM GOAL #2   Title  PT wound length and width to have decreased at least 1 cm to demonstrate a healing environment for pt to begin self care.     Time  6    Period  Weeks    Status  Achieved      PT LONG TERM GOAL #3   Title  PT edema to have decreased to facilitate donning of boot.     Time  6    Period  Weeks    Status  Achieved            Plan - 02/26/19 1510    Clinical Impression Statement  see above    PT Frequency  2x / week    PT Duration  8 weeks    PT Treatment/Interventions  ADLs/Self Care Home Management;Patient/family education;Other (comment)    PT Next Visit Plan  see above    Consulted and Agree with Plan of Care  Patient  Patient will benefit from skilled therapeutic intervention in order to improve the following deficits and impairments:  Pain, Decreased skin integrity, Difficulty walking, Increased edema  Visit Diagnosis: 1. Open wound of right foot with complication, sequela   2. Pain in right foot   3. Localized edema        Problem List There are no active problems to display for this patient.   Rayetta Humphrey, PT CLT [phone] 02/26/2019, 3:11 PM  [facility] [address] [place], [place], [address] Phone: [phone]   Fax:  [phone]  Name: Shakyia Bosso MRN: [id number] Date of Birth: 1955/12/13

## 2019-03-03 ENCOUNTER — Ambulatory Visit (HOSPITAL_COMMUNITY): Payer: Medicaid Other | Admitting: Physical Therapy

## 2019-03-05 ENCOUNTER — Ambulatory Visit (HOSPITAL_COMMUNITY): Payer: Medicaid Other | Admitting: Physical Therapy

## 2019-03-10 ENCOUNTER — Ambulatory Visit (HOSPITAL_COMMUNITY): Payer: Medicaid Other | Admitting: Physical Therapy

## 2019-03-12 ENCOUNTER — Encounter (HOSPITAL_COMMUNITY): Payer: Self-pay

## 2019-03-12 ENCOUNTER — Other Ambulatory Visit: Payer: Self-pay

## 2019-03-12 ENCOUNTER — Ambulatory Visit (HOSPITAL_COMMUNITY): Payer: Medicaid Other

## 2019-03-12 DIAGNOSIS — R600 Localized edema: Secondary | ICD-10-CM

## 2019-03-12 DIAGNOSIS — S91301S Unspecified open wound, right foot, sequela: Secondary | ICD-10-CM

## 2019-03-12 DIAGNOSIS — M79671 Pain in right foot: Secondary | ICD-10-CM

## 2019-03-12 NOTE — Therapy (Signed)
[facility] [place], [place], [address] Phone: [phone]   Fax:  [phone]  Wound Care Therapy  Patient Details  Name: Charmion Hapke MRN: [id number] Date of Birth: May 01, 1956 No data recorded  Encounter Date: 03/12/2019  PT End of Session - 03/12/19 1613    Visit Number  35   Progress note done visit 34   Number of Visits  66    Date for PT Re-Evaluation  03/16/19    Authorization Type  no insurance, self pay    Authorization Time Period  07/18/18-08/28/18; second: 08/21/18-09/25/18; third: 09/25/18-11/06/18; 11/03/18-12/05/18; 11/21/18-01/16/19; NEW: 01/20/19-02/17/19; NEW 02/16/19 to 03/16/19    PT Start Time  1519    PT Stop Time  1550    PT Time Calculation (min)  31 min       Past Medical History:  Diagnosis Date  . Diabetes mellitus without complication ([place])   . Hypertension   . Renal disorder     Past Surgical History:  Procedure Laterality Date  . ANKLE SURGERY    . KNEE SURGERY    . REPLACEMENT TOTAL KNEE      There were no vitals filed for this visit.   Subjective Assessment - 03/12/19 1602    Subjective  Pt arrived very emotional.  Received new orthotic shoes, reports they are an inch too big and causes foot to slide around while walking and had to cut out the pressure relief herself.  Reports she cried in pain for 3 days and foot bled no stop for 3 days.                Wound Therapy - 03/12/19 1605    Subjective  Pt arrived very emotional.  Received new orthotic shoes, reports they are an inch too big and causes foot to slide around while walking and had to cut out the pressure relief herself.  Reports she cried in pain for 3 days and foot bled no stop for 3 days.    Patient and Family Stated Goals  wound to heal     Date of Onset  06/20/18    Prior Treatments  self care     Pain Scale  0-10    Pain Score  2     Pain Type  Acute pain    Pain Location  Foot    Pain Orientation  Right    Patients  Stated Pain Goal  0    Evaluation and Treatment Procedures Explained to Patient/Family  Yes    Evaluation and Treatment Procedures  agreed to    Wound Properties Date First Assessed: 07/18/18 Time First Assessed: 0827 Wound Type: Diabetic ulcer Location: Foot Location Orientation: Right Wound Description (Comments): plantar aspect  Present on Admission: Yes   Dressing Type  Silver hydrofiber   silverhydrofiber, 2x2, medipore tape, coban per request   Dressing Changed  Changed    Dressing Status  Old drainage    Dressing Change Frequency  PRN    Site / Wound Assessment  Red    % Wound base Red or Granulating  100%    % Wound base Other/Granulation Tissue (Comment)  --   callous perimeter   Peri-wound Assessment  Erythema (non-blanchable);Maceration    Wound Length (cm)  1 cm    Wound Width (cm)  1.1 cm   was .9, enlarged following debridement   Wound Depth (cm)  0.4 cm    Wound Volume (cm^3)  0.44 cm^3  Wound Surface Area (cm^2)  1.1 cm^2    Margins  Epibole (rolled edges)    Closure  None    Drainage Amount  Moderate    Drainage Description  Serosanguineous    Treatment  Cleansed;Debridement (Selective)    Selective Debridement - Location  epidboled edges, callous     Selective Debridement - Tools Used  Forceps;Scalpel    Selective Debridement - Tissue Removed  callous, devitalized tissue     Wound Therapy - Clinical Statement  Pt arrived upset, reoprts increased pain and drainage for 3 days following last session.  Upon removal of dressings wound bed continues at 100% granulation in wound bed though callous all perimeter.  Pt educated on minimal progress with healing and goal of self care, verbalized understanding.  Selective debridement for removal of callous perimeter to promote healing and resumed silver hydrofiber dressings for drainage control.  Wrote down appropriate dressings for pt to purchase for transition to self care at home.  Will continue wound care x1 more session to  assure compliance and answer any questions needed for self care     Wound Therapy - Functional Problem List  difficulty walking, donning boot     Factors Delaying/Impairing Wound Healing  Altered sensation;Diabetes Mellitus;Multiple medical problems    Hydrotherapy Plan  Debridement;Dressing change;Other (comment)    Wound Therapy - Frequency  Other (comment)    Wound Therapy - Current Recommendations  PT   once every other week   Wound Plan  F/U on confidence wiht self care    Dressing   silver hydrofiber over wound bed, vasoline over calllous followed by 4x4      Dressing  secured with medipore tape.                 PT Short Term Goals - 02/23/19 1214      PT SHORT TERM GOAL #1   Title  PT to verbalize the signs and sx of cellulitis and to contact MD ASAP     Time  1    Period  Weeks    Status  Achieved    Target Date  07/25/18      PT SHORT TERM GOAL #2   Title  PT callous to be removed from wound to allow decrease pressure to allow wound to heal     Time  3    Period  Weeks    Status  On-going      PT SHORT TERM GOAL #3   Title  wound drainage to decrease to scant to min to allow pt to be able to don socks without soiling     Time  3    Period  Weeks    Status  On-going      PT SHORT TERM GOAL #4   Title  epiboled edges to be gone to allow wound to begin healing     Time  3    Period  Weeks    Status  Partially Met        PT Long Term Goals - 02/23/19 1215      PT LONG TERM GOAL #1   Title  PT wound to have no depth to allow pt to be comfortable with self care     Time  6    Period  Weeks    Status  On-going      PT LONG TERM GOAL #2   Title  PT wound length and width to have decreased at least  1 cm to demonstrate a healing environment for pt to begin self care.     Time  6    Period  Weeks    Status  Achieved      PT LONG TERM GOAL #3   Title  PT edema to have decreased to facilitate donning of boot.     Time  6    Period  Weeks    Status   Achieved              Patient will benefit from skilled therapeutic intervention in order to improve the following deficits and impairments:     Visit Diagnosis: Pain in right foot  Localized edema  Open wound of right foot with complication, sequela     Problem List There are no active problems to display for this patient.  [address], LPTA; [place]  Aldona Lento 03/12/2019, 4:15 PM  [facility] [address] [place], [place], [address] Phone: [phone]   Fax:  [phone]  Name: Merrilyn Legler MRN: [id number] Date of Birth: 1955-10-07

## 2019-03-13 ENCOUNTER — Ambulatory Visit (HOSPITAL_COMMUNITY): Payer: Medicaid Other

## 2019-03-16 ENCOUNTER — Ambulatory Visit (HOSPITAL_COMMUNITY): Payer: Medicaid Other

## 2019-03-25 ENCOUNTER — Telehealth (HOSPITAL_COMMUNITY): Payer: Self-pay

## 2019-03-25 ENCOUNTER — Other Ambulatory Visit: Payer: Self-pay

## 2019-03-25 ENCOUNTER — Encounter (HOSPITAL_COMMUNITY): Payer: Self-pay

## 2019-03-25 ENCOUNTER — Ambulatory Visit (HOSPITAL_COMMUNITY): Payer: Medicaid Other | Attending: Internal Medicine

## 2019-03-25 DIAGNOSIS — S91301S Unspecified open wound, right foot, sequela: Secondary | ICD-10-CM | POA: Diagnosis present

## 2019-03-25 DIAGNOSIS — M79671 Pain in right foot: Secondary | ICD-10-CM | POA: Insufficient documentation

## 2019-03-25 DIAGNOSIS — R600 Localized edema: Secondary | ICD-10-CM

## 2019-03-25 NOTE — Telephone Encounter (Signed)
Called the pt's cell number to ask her to give [place] a call back regarding her medicaid start date and the end date of her coverage.

## 2019-03-25 NOTE — Therapy (Signed)
[facility] [address] [place], [place], [address] Phone: [phone]   Fax:  [phone]  Wound Care Therapy  Patient Details  Name: Suzanne Tran MRN: [id number] Date of Birth: [birth year] No data recorded  Encounter Date: 03/25/2019  PT End of Session - 03/25/19 1805    Visit Number  60   Progress note done visit 57   Number of Visits  69    Date for PT Re-Evaluation  04/22/19    Authorization Type  no insurance, self pay- patient now has medicaid, will request once temporary date ranges is known    Authorization Time Period  07/18/18-08/28/18; second: 08/21/18-09/25/18; third: 09/25/18-11/06/18; 11/03/18-12/05/18; 11/21/18-01/16/19; : 01/20/19-02/17/19; 02/16/19 to 03/16/19; NEW 03/25/19 to 04/22/19    PT Start Time  1545    PT Stop Time  1625    PT Time Calculation (min)  40 min    Activity Tolerance  Patient tolerated treatment well    Behavior During Therapy  [facility] for tasks assessed/performed       Past Medical History:  Diagnosis Date  . Diabetes mellitus without complication ([facility])   . Hypertension   . Renal disorder     Past Surgical History:  Procedure Laterality Date  . ANKLE SURGERY    . KNEE SURGERY    . REPLACEMENT TOTAL KNEE      There were no vitals filed for this visit.              Wound Therapy - 03/25/19 1747    Subjective  Patient arrived with both new offloading shoes on and was also visually upset. Patient reported that her shoe is not properly fit to her foot and she really needs a smaller size, she is following up with the orthotist in two weeks. Patient also reported her concerns about previous treatment (2 treatment sessions ago), where she bled for 2-3 days afterward and reported pain that kept her up at night. Patient concerned with her ability to perform self care as she cannot see the bottom of her foot well and thinks she would still benefit from skilled physical therapy.     Patient and Family Stated Goals   wound to heal     Date of Onset  06/20/18    Prior Treatments  self care     Pain Score  0-No pain    Evaluation and Treatment Procedures Explained to Patient/Family  Yes    Evaluation and Treatment Procedures  agreed to    Wound Properties Date First Assessed: 07/18/18 Time First Assessed: 0827 Wound Type: Diabetic ulcer Location: Foot Location Orientation: Right Wound Description (Comments): plantar aspect  Present on Admission: Yes   Dressing Type  Silver hydrofiber   silverhydrofiber, 2x2, medipore tape, coban per request   Dressing Changed  Changed    Dressing Status  Old drainage    Dressing Change Frequency  PRN    Site / Wound Assessment  Pink;Pale    % Wound base Red or Granulating  100%    % Wound base Other/Granulation Tissue (Comment)  --   callous perimeter   Peri-wound Assessment  Erythema (non-blanchable)    Wound Length (cm)  1.8 cm   1   Wound Width (cm)  1.5 cm   1.1   Wound Depth (cm)  0.6 cm   .4   Wound Volume (cm^3)  1.62 cm^3    Wound Surface Area (cm^2)  2.7 cm^2    Undermining (cm)  at 12  o'clock    Margins  Epibole (rolled edges)    Closure  None    Drainage Amount  Moderate    Drainage Description  Serosanguineous    Treatment  Cleansed;Debridement (Selective)    Selective Debridement - Location  epidboled edges, callous     Selective Debridement - Tools Used  Forceps;Scalpel;Scissors    Selective Debridement - Tissue Removed  callous, devitalized tissue     Wound Therapy - Clinical Statement  Session focused on reassuring patient and answering all questions about current care, self-care and plan for physical therapy and wound care moving forward. Patient reported she felt better about all of wound care concerns by the end of session. Continued cleansing and debridement of callus to promote wound approximation. Dressed with silver hydrofiber and petroleum around the wound margins.  Wound measurements were obtained and wound was notably bigger compared to  previous treatment session. Will continue to assess wound size in upcoming sessions to determine next step with wound care. Currently, extending plan of care for 2 visits at a frequency of 1 time every other week to observe wound for additional changes with goal to transition to self-care now that she has the appropriate orthopedic shoes to offload her wound. Emphasized importance of 100% compliance with wearing orthopedic shoes when weightbearing to allow proper healing. Patient would continue to benefit from skilled physical therapy to promote wound healing and to educate patient in proper self-care.     Wound Therapy - Functional Problem List  difficulty walking, donning boot     Factors Delaying/Impairing Wound Healing  Altered sensation;Diabetes Mellitus;Multiple medical problems    Hydrotherapy Plan  Debridement;Dressing change;Other (comment)    Wound Therapy - Frequency  Other (comment)   biweekly for 4 weeks   Wound Therapy - Current Recommendations  PT   once every other week   Wound Plan  F/U on confidence with self care and assess wound size    Dressing   silver hydrofiber over wound bed, vasoline over calllous followed by 4x4      Dressing  secured with medipore tape. Kerlix and coban.                PT Short Term Goals - 03/25/19 1803      PT SHORT TERM GOAL #1   Title  PT to verbalize the signs and sx of cellulitis and to contact MD ASAP     Time  1    Period  Weeks    Status  Achieved    Target Date  07/25/18      PT SHORT TERM GOAL #2   Title  PT callous to be removed from wound to allow decrease pressure to allow wound to heal     Time  3    Period  Weeks    Status  On-going      PT SHORT TERM GOAL #3   Title  wound drainage to decrease to scant to min to allow pt to be able to don socks without soiling     Time  3    Period  Weeks    Status  On-going      PT SHORT TERM GOAL #4   Title  epiboled edges to be gone to allow wound to begin healing     Time   3    Period  Weeks    Status  On-going        PT Long Term Goals - 03/25/19 1803  PT LONG TERM GOAL #1   Title  PT wound to have no depth to allow pt to be comfortable with self care     Time  6    Period  Weeks    Status  On-going      PT LONG TERM GOAL #2   Title  PT wound length and width to have decreased at least 1 cm to demonstrate a healing environment for pt to begin self care.     Time  6    Period  Weeks    Status  Achieved      PT LONG TERM GOAL #3   Title  PT edema to have decreased to facilitate donning of boot.     Time  6    Period  Weeks    Status  Achieved            Plan - 03/25/19 1802    Clinical Impression Statement  see above    Rehab Potential  Fair    PT Frequency  Biweekly    PT Duration  4 weeks    PT Treatment/Interventions  ADLs/Self Care Home Management;Patient/family education;Other (comment)    PT Next Visit Plan  see above    Consulted and Agree with Plan of Care  Patient       Patient will benefit from skilled therapeutic intervention in order to improve the following deficits and impairments:  Pain, Decreased skin integrity, Difficulty walking, Increased edema  Visit Diagnosis: Pain in right foot  Localized edema  Open wound of right foot with complication, sequela     Problem List There are no active problems to display for this patient.  6:07 PM, 03/25/19 Jerene Pitch, DPT Physical Therapy with [facility]  [phone] office  [facility] [address] [place], [place], [address] Phone: [phone]   Fax:  [phone]  Name: Suzanne Tran MRN: [id number] Date of Birth: [birth year]

## 2019-04-07 ENCOUNTER — Telehealth (HOSPITAL_COMMUNITY): Payer: Self-pay | Admitting: Physical Therapy

## 2019-04-07 NOTE — Telephone Encounter (Signed)
pt called to cancel her appt due to she has an eye appt.

## 2019-04-08 ENCOUNTER — Ambulatory Visit (HOSPITAL_COMMUNITY): Payer: Medicaid Other | Admitting: Physical Therapy

## 2019-04-08 ENCOUNTER — Other Ambulatory Visit (HOSPITAL_BASED_OUTPATIENT_CLINIC_OR_DEPARTMENT_OTHER): Payer: Self-pay

## 2019-04-08 DIAGNOSIS — R5383 Other fatigue: Secondary | ICD-10-CM

## 2019-04-09 ENCOUNTER — Other Ambulatory Visit: Payer: Self-pay

## 2019-04-09 ENCOUNTER — Ambulatory Visit (HOSPITAL_COMMUNITY): Payer: Medicaid Other | Admitting: Physical Therapy

## 2019-04-09 ENCOUNTER — Encounter (HOSPITAL_COMMUNITY): Payer: Self-pay | Admitting: Physical Therapy

## 2019-04-09 DIAGNOSIS — M79671 Pain in right foot: Secondary | ICD-10-CM | POA: Diagnosis not present

## 2019-04-09 DIAGNOSIS — R600 Localized edema: Secondary | ICD-10-CM

## 2019-04-09 DIAGNOSIS — S91301S Unspecified open wound, right foot, sequela: Secondary | ICD-10-CM

## 2019-04-09 NOTE — Therapy (Signed)
[place], [place], [address] Phone: [phone]   Fax:  [phone]  Wound Care Therapy  Patient Details  Name: Suzanne Tran MRN: [id number] Date of Birth: [birth year] No data recorded  Encounter Date: 04/09/2019  PT End of Session - 04/09/19 1316    Visit Number  7   Progress note done visit 41   Number of Visits  41    Date for PT Re-Evaluation  04/22/19    Authorization Type  no insurance, self pay- patient now has medicaid, will request once temporary date ranges is known    Authorization Time Period  07/18/18-08/28/18; second: 08/21/18-09/25/18; third: 09/25/18-11/06/18; 11/03/18-12/05/18; 11/21/18-01/16/19; : 01/20/19-02/17/19; 02/16/19 to 03/16/19; NEW 03/25/19 to 04/22/19    PT Start Time  1130    PT Stop Time  1210    PT Time Calculation (min)  40 min    Activity Tolerance  Patient tolerated treatment well    Behavior During Therapy  [facility] for tasks assessed/performed       Past Medical History:  Diagnosis Date  . Diabetes mellitus without complication ([place])   . Hypertension   . Renal disorder     Past Surgical History:  Procedure Laterality Date  . ANKLE SURGERY    . KNEE SURGERY    . REPLACEMENT TOTAL KNEE      There were no vitals filed for this visit.              Wound Therapy - 04/09/19 1305    Subjective  pt reports she has an eye appointment tomorrow.  States they have been changing her bandage every other day at home.      Patient and Family Stated Goals  wound to heal     Date of Onset  06/20/18    Prior Treatments  self care     Pain Score  0-No pain    Evaluation and Treatment Procedures Explained to Patient/Family  Yes    Evaluation and Treatment Procedures  agreed to    Wound Properties Date First Assessed: 07/18/18 Time First Assessed: 0827 Wound Type: Diabetic ulcer Location: Foot Location Orientation: Right Wound Description (Comments): plantar aspect  Present on Admission: Yes   Dressing Type  Silver hydrofiber   silverhydrofiber, 2x2, medipore tape, coban per request   Dressing Changed  Changed    Dressing Status  Old drainage    Dressing Change Frequency  PRN    Site / Wound Assessment  Pink;Pale    % Wound base Red or Granulating  100%    % Wound base Other/Granulation Tissue (Comment)  --   callous perimeter   Peri-wound Assessment  Erythema (non-blanchable)    Wound Length (cm)  1.6 cm   1.8 cm on 03/25/19   Wound Width (cm)  1.6 cm   was 1.5 cm on 03/25/19   Wound Depth (cm)  1 cm   at least; could be deeper.  0.6cm on 03/25/19   Wound Volume (cm^3)  2.56 cm^3    Wound Surface Area (cm^2)  2.56 cm^2    Margins  Epibole (rolled edges)    Closure  None    Drainage Amount  Moderate    Drainage Description  Serosanguineous    Treatment  Cleansed;Debridement (Selective)    Selective Debridement - Location  epidboled edges, callous     Selective Debridement - Tools Used  Forceps;Scalpel;Scissors    Selective Debridement - Tissue Removed  callous, devitalized tissue  Wound Therapy - Clinical Statement  excessive build up of callous around all borders heavily debrided.  Remeasured this session with increased depth and little change in size.  Overall size is hard to measure precisely due to unknown integrity of tissue within wound itself and true depth of wound.  Suspect there may be tunneling into the foot.  In light of pateint completing 61 sessions over a 9 month span and little change in last several months, pt would now beneift from other venue of care.  Discussed with pateint that a facility with MD on site may be more beneficial at this point. Pt verbalized understanding.     Wound Therapy - Functional Problem List  difficulty walking, donning boot     Factors Delaying/Impairing Wound Healing  Altered sensation;Diabetes Mellitus;Multiple medical problems    Hydrotherapy Plan  Debridement;Dressing change;Other (comment)    Wound Therapy - Frequency  Other  (comment)   biweekly for 4 weeks   Wound Therapy - Current Recommendations  PT   once every other week   Wound Plan  Therapist to contact MD regarding referral to woundcare facility with MD on site.      Dressing   silver hydrofiber over wound bed, vasoline over calllous followed by 4x4      Dressing  secured with medipore tape. Kerlix and coban.                PT Short Term Goals - 03/25/19 1803      PT SHORT TERM GOAL #1   Title  PT to verbalize the signs and sx of cellulitis and to contact MD ASAP     Time  1    Period  Weeks    Status  Achieved    Target Date  07/25/18      PT SHORT TERM GOAL #2   Title  PT callous to be removed from wound to allow decrease pressure to allow wound to heal     Time  3    Period  Weeks    Status  On-going      PT SHORT TERM GOAL #3   Title  wound drainage to decrease to scant to min to allow pt to be able to don socks without soiling     Time  3    Period  Weeks    Status  On-going      PT SHORT TERM GOAL #4   Title  epiboled edges to be gone to allow wound to begin healing     Time  3    Period  Weeks    Status  On-going        PT Long Term Goals - 03/25/19 1803      PT LONG TERM GOAL #1   Title  PT wound to have no depth to allow pt to be comfortable with self care     Time  6    Period  Weeks    Status  On-going      PT LONG TERM GOAL #2   Title  PT wound length and width to have decreased at least 1 cm to demonstrate a healing environment for pt to begin self care.     Time  6    Period  Weeks    Status  Achieved      PT LONG TERM GOAL #3   Title  PT edema to have decreased to facilitate donning of boot.     Time  6  Period  Weeks    Status  Achieved              Patient will benefit from skilled therapeutic intervention in order to improve the following deficits and impairments:     Visit Diagnosis: No diagnosis found.     Problem List There are no active problems to display for this  patient.  [name], PTA/CLT [phone]  [name] 04/09/2019, 1:18 PM  [place], [place], [address] Phone: [phone]   Fax:  [phone]  Name: Audriena Pizzimenti MRN: [id number] Date of Birth: August 23, 1955

## 2019-04-10 ENCOUNTER — Telehealth (HOSPITAL_COMMUNITY): Payer: Self-pay | Admitting: Physical Therapy

## 2019-04-10 NOTE — Telephone Encounter (Signed)
She has apptments for 9/29 and 10/7- until she gets in with the [facility] I gave her their address and phone number.

## 2019-04-14 ENCOUNTER — Other Ambulatory Visit: Payer: Self-pay

## 2019-04-14 ENCOUNTER — Ambulatory Visit (HOSPITAL_COMMUNITY): Payer: Medicaid Other | Admitting: Physical Therapy

## 2019-04-14 DIAGNOSIS — M79671 Pain in right foot: Secondary | ICD-10-CM

## 2019-04-14 DIAGNOSIS — S91301S Unspecified open wound, right foot, sequela: Secondary | ICD-10-CM

## 2019-04-14 DIAGNOSIS — R600 Localized edema: Secondary | ICD-10-CM

## 2019-04-14 NOTE — Therapy (Signed)
[facility] [place], [place], [address] Phone: [phone]   Fax:  [phone]  Wound Care Therapy  Patient Details  Name: Suzanne Tran MRN: [id number] Date of Birth: Feb 02, 1956 No data recorded  Encounter Date: 04/14/2019  PT End of Session - 04/14/19 1807    Visit Number  62   Progress note done visit 19   Number of Visits  69    Date for PT Re-Evaluation  04/22/19    Authorization Type  no insurance, self pay- patient now has medicaid, will request once temporary date ranges is known    Authorization Time Period  07/18/18-08/28/18; second: 08/21/18-09/25/18; third: 09/25/18-11/06/18; 11/03/18-12/05/18; 11/21/18-01/16/19; : 01/20/19-02/17/19; 02/16/19 to 03/16/19; NEW 03/25/19 to 04/22/19    Authorization - Visit Number  10    Authorization - Number of Visits  10    PT Start Time  1455    PT Stop Time  1530    PT Time Calculation (min)  35 min    Activity Tolerance  Patient tolerated treatment well    Behavior During Therapy  [facility] for tasks assessed/performed       Past Medical History:  Diagnosis Date  . Diabetes mellitus without complication ([place])   . Hypertension   . Renal disorder     Past Surgical History:  Procedure Laterality Date  . ANKLE SURGERY    . KNEE SURGERY    . REPLACEMENT TOTAL KNEE      There were no vitals filed for this visit.              Wound Therapy - 04/14/19 1803    Subjective  pt states they shaved her eye last thursday and it's already much better.  [facility] MD office called to inform her she would be getting a call from [facility] wound center to schedule an appointment.     Patient and Family Stated Goals  wound to heal     Date of Onset  06/20/18    Prior Treatments  self care     Pain Score  0-No pain    Evaluation and Treatment Procedures Explained to Patient/Family  Yes    Evaluation and Treatment Procedures  agreed to    Wound Properties Date First Assessed: 07/18/18 Time First Assessed:  0827 Wound Type: Diabetic ulcer Location: Foot Location Orientation: Right Wound Description (Comments): plantar aspect  Present on Admission: Yes   Dressing Type  Silver hydrofiber   silverhydrofiber, 2x2, medipore tape, coban per request   Dressing Changed  Changed    Dressing Status  Old drainage    Dressing Change Frequency  PRN    Site / Wound Assessment  Pink;Pale    % Wound base Red or Granulating  100%    % Wound base Other/Granulation Tissue (Comment)  --   callous perimeter   Peri-wound Assessment  Erythema (non-blanchable)    Wound Length (cm)  1.6 cm    Wound Width (cm)  1.6 cm    Wound Depth (cm)  1 cm    Wound Volume (cm^3)  2.56 cm^3    Wound Surface Area (cm^2)  2.56 cm^2    Margins  Epibole (rolled edges)    Closure  None    Drainage Amount  Moderate    Drainage Description  Serosanguineous    Treatment  Cleansed;Debridement (Selective)    Selective Debridement - Location  epidboled edges, callous     Selective Debridement - Tools Used  Forceps;Scalpel;Scissors  Selective Debridement - Tissue Removed  callous, devitalized tissue     Wound Therapy - Clinical Statement  continued to debride away excessive callous build up around wound opening to promote approximation.  Unknown depth at this time but feel it doe have more depth than previously measured.  Albe to pack more silver hydrofiber into wound this session.  continued with kerlix and coban to secure.  Will continue woundcare here until care is transitioned to [place].    Wound Therapy - Functional Problem List  difficulty walking, donning boot     Factors Delaying/Impairing Wound Healing  Altered sensation;Diabetes Mellitus;Multiple medical problems    Hydrotherapy Plan  Debridement;Dressing change;Other (comment)    Wound Therapy - Frequency  Other (comment)   biweekly for 4 weeks   Wound Therapy - Current Recommendations  PT   once every other week   Wound Plan  continue woundcare here until transitions to [place].   Complete weekly measurements.     Dressing   silver hydrofiber over wound bed, vasoline over calllous followed by 4x4      Dressing  secured with medipore tape. Kerlix and coban.                PT Short Term Goals - 03/25/19 1803      PT SHORT TERM GOAL #1   Title  PT to verbalize the signs and sx of cellulitis and to contact MD ASAP     Time  1    Period  Weeks    Status  Achieved    Target Date  07/25/18      PT SHORT TERM GOAL #2   Title  PT callous to be removed from wound to allow decrease pressure to allow wound to heal     Time  3    Period  Weeks    Status  On-going      PT SHORT TERM GOAL #3   Title  wound drainage to decrease to scant to min to allow pt to be able to don socks without soiling     Time  3    Period  Weeks    Status  On-going      PT SHORT TERM GOAL #4   Title  epiboled edges to be gone to allow wound to begin healing     Time  3    Period  Weeks    Status  On-going        PT Long Term Goals - 03/25/19 1803      PT LONG TERM GOAL #1   Title  PT wound to have no depth to allow pt to be comfortable with self care     Time  6    Period  Weeks    Status  On-going      PT LONG TERM GOAL #2   Title  PT wound length and width to have decreased at least 1 cm to demonstrate a healing environment for pt to begin self care.     Time  6    Period  Weeks    Status  Achieved      PT LONG TERM GOAL #3   Title  PT edema to have decreased to facilitate donning of boot.     Time  6    Period  Weeks    Status  Achieved              Patient will benefit from skilled therapeutic intervention in order  to improve the following deficits and impairments:     Visit Diagnosis: No diagnosis found.     Problem List There are no active problems to display for this patient. [name], PTA/CLT [phone]   [name] 04/14/2019, 6:09 PM  [facility] [address] [place],  [place], [address] Phone: [phone]   Fax:  [phone]  Name: Suzanne Tran MRN: [id number] Date of Birth: 12-01-1955

## 2019-04-17 ENCOUNTER — Other Ambulatory Visit: Payer: Self-pay

## 2019-04-17 ENCOUNTER — Ambulatory Visit: Payer: Medicaid Other | Attending: Internal Medicine | Admitting: Neurology

## 2019-04-17 DIAGNOSIS — Z794 Long term (current) use of insulin: Secondary | ICD-10-CM | POA: Insufficient documentation

## 2019-04-17 DIAGNOSIS — R5383 Other fatigue: Secondary | ICD-10-CM | POA: Diagnosis not present

## 2019-04-17 DIAGNOSIS — Z79899 Other long term (current) drug therapy: Secondary | ICD-10-CM | POA: Insufficient documentation

## 2019-04-17 DIAGNOSIS — G4733 Obstructive sleep apnea (adult) (pediatric): Secondary | ICD-10-CM | POA: Insufficient documentation

## 2019-04-21 ENCOUNTER — Telehealth (HOSPITAL_COMMUNITY): Payer: Self-pay | Admitting: Physical Therapy

## 2019-04-21 NOTE — Telephone Encounter (Signed)
pt called to cancel due this appt due to she has an appt at the [place] location for wound care too.

## 2019-04-22 ENCOUNTER — Ambulatory Visit (HOSPITAL_COMMUNITY): Payer: Medicaid Other | Admitting: Physical Therapy

## 2019-04-22 ENCOUNTER — Encounter (HOSPITAL_COMMUNITY): Payer: Self-pay | Admitting: Physical Therapy

## 2019-04-22 NOTE — Therapy (Signed)
[facility], [place], [address] Phone: [phone]   Fax:  [phone]  Patient Details  Name: Suzanne Tran MRN: [id number] Date of Birth: September 12, 1955 Referring Provider:  No ref. provider found  Encounter Date: 04/22/2019   PHYSICAL THERAPY DISCHARGE SUMMARY  Visits from Start of Care: 62  Current functional level related to goals / functional outcomes: PT wound and callous have decreased significantly but we have been unable to heal it therefore pt is being referred to the wound center   Remaining deficits: Wound with callous build up   Education / Equipment: The importance of wearing the orthotics given to her.   Plan: Patient agrees to discharge.  Patient goals were partially met. Patient is being discharged due to lack of progress.  ?????     Rayetta Humphrey, [place] CLT [phone] 04/22/2019, 1:09 PM  [facility] [address] [place], [place], [address] Phone: [phone]   Fax:  [phone]

## 2019-04-25 NOTE — Procedures (Signed)
   [place] A. Merlene Laughter, MD     [email or web address]               HOME SLEEP TEST  LOCATION: [place]   Patient Name: Suzanne Tran, Suzanne Tran Date: 04/17/2019 Gender: Female D.O.B: 30-Mar-1956 Age (years): 63 Referring Provider: Delphina Cahill Height (inches): 30 Interpreting Physician: Phillips Odor MD, ABSM Weight (lbs): 240 RPSGT: Rosebud Poles BMI: 35 MRN: [id number] Neck Size: CLINICAL INFORMATION Sleep Study Type: HST     Indication for sleep study: Fatigue, Morbid Obesity     Epworth Sleepiness Score: NA  SLEEP STUDY TECHNIQUE A multi-channel overnight portable sleep study was performed. The channels recorded were: nasal airflow, thoracic respiratory movement, and oxygen saturation with a pulse oximetry. Snoring was also monitored.  MEDICATIONS Patient self administered medications include: N/A.  Current Outpatient Medications:  .  insulin regular (NOVOLIN R) 100 units/mL injection, Inject into the skin 3 (three) times daily before meals. Sliding scale, Disp: , Rfl:  .  losartan-hydrochlorothiazide (HYZAAR) 50-12.5 MG tablet, Take 1 tablet by mouth daily. , Disp: , Rfl:  .  metFORMIN (GLUCOPHAGE) 500 MG tablet, Take 500 mg by mouth 2 (two) times daily with a meal., Disp: , Rfl:    SLEEP ARCHITECTURE Patient was studied for 499.4 minutes. The sleep efficiency was 92.5 % and the patient was supine for 23%. The arousal index was 0.0 per hour.  RESPIRATORY PARAMETERS The overall AHI was 13.1 per hour, with a central apnea index of 0.8 per hour.  The oxygen nadir was 71% during sleep.     CARDIAC DATA Mean heart rate during sleep was 80.6 bpm.  IMPRESSIONS Mild obstructive sleep apnea occurred during this study (AHI = 13.1/h). AutoPAP 5-14 is recommended.  Delano Metz, MD Diplomate, American Board of Sleep Medicine.  ELECTRONICALLY SIGNED ON:  04/25/2019, 5:04 PM [place] PH: [phone]   FX: [phone]  [phone] [name]

## 2019-05-07 ENCOUNTER — Ambulatory Visit (HOSPITAL_COMMUNITY): Payer: Medicaid Other | Admitting: Physical Therapy

## 2019-10-02 NOTE — Progress Notes (Deleted)
[facility] - Clinic Note  10/05/2019     CHIEF COMPLAINT Patient presents for No chief complaint on file.   HISTORY OF PRESENT ILLNESS: Suzanne Tran is a 64 y.o. female who presents to the clinic today for:     Referring physician: Benita Stabile, MD [address] [place],  [place] [address]  HISTORICAL INFORMATION:   Selected notes from the MEDICAL RECORD NUMBER Referred by Dr. Sherryll Burger for concern of BDR with DME    CURRENT MEDICATIONS: No current outpatient medications on file. (Ophthalmic Drugs)   No current facility-administered medications for this visit. (Ophthalmic Drugs)   Current Outpatient Medications (Other)  Medication Sig  . insulin regular (NOVOLIN R) 100 units/mL injection Inject into the skin 3 (three) times daily before meals. Sliding scale  . losartan-hydrochlorothiazide (HYZAAR) 50-12.5 MG tablet Take 1 tablet by mouth daily.   . metFORMIN (GLUCOPHAGE) 500 MG tablet Take 500 mg by mouth 2 (two) times daily with a meal.   No current facility-administered medications for this visit. (Other)      REVIEW OF SYSTEMS:    ALLERGIES Allergies  Allergen Reactions  . Codeine     Rash and oral swelling     PAST MEDICAL HISTORY Past Medical History:  Diagnosis Date  . Diabetes mellitus without complication (H[facility])   . Hypertension   . Renal disorder    Past Surgical History:  Procedure Laterality Date  . ANKLE SURGERY    . KNEE SURGERY    . REPLACEMENT TOTAL KNEE      FAMILY HISTORY No family history on file.  SOCIAL HISTORY Social History   Tobacco Use  . Smoking status: Never Smoker  . Smokeless tobacco: Never Used  Substance Use Topics  . Alcohol use: Never  . Drug use: Never         OPHTHALMIC EXAM:  Not recorded      IMAGING AND PROCEDURES  Imaging and Procedures for @TODAY @           ASSESSMENT/PLAN:    ICD-10-CM   1. Retinal edema  H35.81     1.  2.  3.  Ophthalmic Meds Ordered  this visit:  No orders of the defined types were placed in this encounter.      No follow-ups on file.  There are no Patient Instructions on file for this visit.   Explained the diagnoses, plan, and follow up with the patient and they expressed understanding.  Patient expressed understanding of the importance of proper follow up care.   This document serves as a record of services personally performed by , MD, PhD. It was created on their behalf by Karie Chimera, COA, a certified ophthalmic assistant. The creation of this record is the provider's dictation and/or activities during the visit.    Electronically signed by: Herby Abraham, COA @TODAY @ 10:31 AM  Herby Abraham, M.D., Ph.D. Diseases & Surgery of the Retina and Vitreous [facility] @TODAY @     Abbreviations: M myopia (nearsighted); A astigmatism; H hyperopia (farsighted); P presbyopia; Mrx spectacle prescription;  CTL contact lenses; OD right eye; OS left eye; OU both eyes  XT exotropia; ET esotropia; PEK punctate epithelial keratitis; PEE punctate epithelial erosions; DES dry eye syndrome; MGD meibomian gland dysfunction; ATs artificial tears; PFAT's preservative free artificial tears; NSC nuclear sclerotic cataract; PSC posterior subcapsular cataract; ERM epi-retinal membrane; PVD posterior vitreous detachment; RD retinal detachment; DM diabetes mellitus; DR diabetic  retinopathy; NPDR non-proliferative diabetic retinopathy; PDR proliferative diabetic retinopathy; CSME clinically significant macular edema; DME diabetic macular edema; dbh dot blot hemorrhages; CWS cotton wool spot; POAG primary open angle glaucoma; C/D cup-to-disc ratio; HVF humphrey visual field; GVF goldmann visual field; OCT optical coherence tomography; IOP intraocular pressure; BRVO Branch retinal vein occlusion; CRVO central retinal vein occlusion; CRAO central retinal artery occlusion; BRAO branch retinal artery  occlusion; RT retinal tear; SB scleral buckle; PPV pars plana vitrectomy; VH Vitreous hemorrhage; PRP panretinal laser photocoagulation; IVK intravitreal kenalog; VMT vitreomacular traction; MH Macular hole;  NVD neovascularization of the disc; NVE neovascularization elsewhere; AREDS age related eye disease study; ARMD age related macular degeneration; POAG primary open angle glaucoma; EBMD epithelial/anterior basement membrane dystrophy; ACIOL anterior chamber intraocular lens; IOL intraocular lens; PCIOL posterior chamber intraocular lens; Phaco/IOL phacoemulsification with intraocular lens placement; [place] photorefractive keratectomy; LASIK laser assisted in situ keratomileusis; HTN hypertension; DM diabetes mellitus; COPD chronic obstructive pulmonary disease

## 2019-10-05 ENCOUNTER — Encounter (INDEPENDENT_AMBULATORY_CARE_PROVIDER_SITE_OTHER): Payer: Self-pay

## 2019-10-05 ENCOUNTER — Encounter (INDEPENDENT_AMBULATORY_CARE_PROVIDER_SITE_OTHER): Payer: Medicaid Other | Admitting: Ophthalmology

## 2019-10-06 NOTE — Progress Notes (Signed)
This encounter was created in error - please disregard.

## 2019-10-10 ENCOUNTER — Encounter (HOSPITAL_COMMUNITY): Payer: Self-pay | Admitting: Emergency Medicine

## 2019-10-10 ENCOUNTER — Emergency Department (HOSPITAL_COMMUNITY)
Admission: EM | Admit: 2019-10-10 | Discharge: 2019-10-10 | Disposition: A | Discharge status: Home / Self Care | Payer: Medicaid Other | Attending: Emergency Medicine | Admitting: Emergency Medicine

## 2019-10-10 ENCOUNTER — Emergency Department (HOSPITAL_COMMUNITY): Payer: Medicaid Other

## 2019-10-10 ENCOUNTER — Other Ambulatory Visit: Payer: Self-pay

## 2019-10-10 DIAGNOSIS — R0602 Shortness of breath: Secondary | ICD-10-CM | POA: Diagnosis not present

## 2019-10-10 DIAGNOSIS — I10 Essential (primary) hypertension: Secondary | ICD-10-CM | POA: Diagnosis not present

## 2019-10-10 DIAGNOSIS — R0789 Other chest pain: Secondary | ICD-10-CM | POA: Diagnosis not present

## 2019-10-10 DIAGNOSIS — E119 Type 2 diabetes mellitus without complications: Secondary | ICD-10-CM | POA: Insufficient documentation

## 2019-10-10 DIAGNOSIS — Z20822 Contact with and (suspected) exposure to covid-19: Secondary | ICD-10-CM | POA: Insufficient documentation

## 2019-10-10 DIAGNOSIS — Z794 Long term (current) use of insulin: Secondary | ICD-10-CM | POA: Diagnosis not present

## 2019-10-10 LAB — COMPREHENSIVE METABOLIC PANEL WITH GFR
ALT: 11 U/L (ref 0–44)
AST: 12 U/L — ABNORMAL LOW (ref 15–41)
Albumin: 3.5 g/dL (ref 3.5–5.0)
Alkaline Phosphatase: 55 U/L (ref 38–126)
Anion gap: 7 (ref 5–15)
BUN: 32 mg/dL — ABNORMAL HIGH (ref 8–23)
CO2: 27 mmol/L (ref 22–32)
Calcium: 9.2 mg/dL (ref 8.9–10.3)
Chloride: 104 mmol/L (ref 98–111)
Creatinine, Ser: 1.12 mg/dL — ABNORMAL HIGH (ref 0.44–1.00)
GFR calc Af Amer: >60 mL/min (ref >60)
GFR calc non Af Amer: 52 mL/min — ABNORMAL LOW (ref >60)
Glucose, Bld: 91 mg/dL (ref 70–99)
Potassium: 4.2 mmol/L (ref 3.5–5.1)
Sodium: 138 mmol/L (ref 135–145)
Total Bilirubin: 0.5 mg/dL (ref 0.3–1.2)
Total Protein: 7.1 g/dL (ref 6.5–8.1)

## 2019-10-10 LAB — CBC WITH DIFFERENTIAL/PLATELET
Abs Immature Granulocytes: 0.02 K/uL (ref 0.00–0.07)
Basophils Absolute: 0.1 K/uL (ref 0.0–0.1)
Basophils Relative: 1 %
Eosinophils Absolute: 0.2 K/uL (ref 0.0–0.5)
Eosinophils Relative: 4 %
HCT: 34.1 % — ABNORMAL LOW (ref 36.0–46.0)
Hemoglobin: 10.6 g/dL — ABNORMAL LOW (ref 12.0–15.0)
Immature Granulocytes: 0 %
Lymphocytes Relative: 29 %
Lymphs Abs: 1.6 K/uL (ref 0.7–4.0)
MCH: 28.8 pg (ref 26.0–34.0)
MCHC: 31.1 g/dL (ref 30.0–36.0)
MCV: 92.7 fL (ref 80.0–100.0)
Monocytes Absolute: 0.4 K/uL (ref 0.1–1.0)
Monocytes Relative: 7 %
Neutro Abs: 3.2 K/uL (ref 1.7–7.7)
Neutrophils Relative %: 59 %
Platelets: 287 K/uL (ref 150–400)
RBC: 3.68 MIL/uL — ABNORMAL LOW (ref 3.87–5.11)
RDW: 13 % (ref 11.5–15.5)
WBC: 5.5 K/uL (ref 4.0–10.5)
nRBC: 0 % (ref 0.0–0.2)

## 2019-10-10 LAB — POC SARS CORONAVIRUS 2 AG -  ED: SARS Coronavirus 2 Ag: NEGATIVE

## 2019-10-10 LAB — TROPONIN I (HIGH SENSITIVITY)
Troponin I (High Sensitivity): 16 ng/L (ref <18)
Troponin I (High Sensitivity): 16 ng/L (ref <18)

## 2019-10-10 LAB — D-DIMER, QUANTITATIVE: D-Dimer, Quant: 2.09 ug/mL-FEU — ABNORMAL HIGH (ref 0.00–0.50)

## 2019-10-10 LAB — CBG MONITORING, ED: Glucose-Capillary: 63 mg/dL — ABNORMAL LOW (ref 70–99)

## 2019-10-10 MED ORDER — IOHEXOL 350 MG/ML SOLN
100.0000 mL | Freq: Once | INTRAVENOUS | Status: AC | PRN
  Administered 2019-10-10: 100 mL via INTRAVENOUS

## 2019-10-10 NOTE — ED Notes (Signed)
Pt given peanut butter crackers and a drink

## 2019-10-10 NOTE — ED Provider Notes (Signed)
[facility] EMERGENCY DEPARTMENT Provider Note   CSN: [id number] Arrival date & time: 10/10/19  1416     History Chief Complaint  Patient presents with  . Shortness of Breath    Suzanne Tran is a 64 y.o. female.  Pt complains of feeling short of breath today. Pt reports she was started on Augmentin on Monday for bronchitis.  Pt is worried that she has pneumonia.  Pt had a negative covid test on Monday   The history is provided by the patient. No language interpreter was used.  Shortness of Breath Severity:  Severe Onset quality:  Sudden Duration:  1 day Timing:  Constant Progression:  Worsening Chronicity:  New Context: pollens   Context: not activity   Relieved by:  Nothing Worsened by:  Nothing Ineffective treatments:  None tried Associated symptoms: cough and sputum production   Associated symptoms: no chest pain   Risk factors: obesity   Risk factors: no recent alcohol use and no hx of PE/DVT        Past Medical History:  Diagnosis Date  . Diabetes mellitus without complication ([place])   . Hypertension   . Renal disorder     There are no problems to display for this patient.   Past Surgical History:  Procedure Laterality Date  . ANKLE SURGERY    . KNEE SURGERY    . REPLACEMENT TOTAL KNEE    . TONSILLECTOMY       OB History    Gravida  1   Para  1   Term  1   Preterm      AB      Living  1     SAB      TAB      Ectopic      Multiple      Live Births              History reviewed. No pertinent family history.  Social History   Tobacco Use  . Smoking status: Never Smoker  . Smokeless tobacco: Never Used  Substance Use Topics  . Alcohol use: Never  . Drug use: Never    Home Medications Prior to Admission medications   Medication Sig Start Date End Date Taking? Authorizing Provider  insulin regular (NOVOLIN R) 100 units/mL injection Inject into the skin 3 (three) times daily before meals. Sliding scale    [provider]  losartan-hydrochlorothiazide (HYZAAR) 50-12.5 MG tablet Take 1 tablet by mouth daily.  08/21/17   [provider]  metFORMIN (GLUCOPHAGE) 500 MG tablet Take 500 mg by mouth 2 (two) times daily with a meal.    [provider]    Allergies    Codeine and Dilantin [phenytoin]  Review of Systems   Review of Systems  Respiratory: Positive for cough, sputum production and shortness of breath.   Cardiovascular: Negative for chest pain.  All other systems reviewed and are negative.   Physical Exam Updated Vital Signs BP (!) 150/70   Pulse 89   Temp 98.4 F (36.9 C) (Oral)   Resp (!) 22   Ht 5' 9.5" (1.765 m)   Wt 113.4 kg   SpO2 100%   BMI 36.39 kg/m   Physical Exam Vitals and nursing note reviewed.  Constitutional:      Appearance: She is well-developed.  HENT:     Head: Normocephalic.  Cardiovascular:     Rate and Rhythm: Normal rate and regular rhythm.  Pulmonary:     Effort:  Pulmonary effort is normal.     Breath sounds: No decreased breath sounds.  Chest:     Chest wall: No mass.  Abdominal:     General: There is no distension.  Musculoskeletal:        General: Normal range of motion.     Cervical back: Normal range of motion.  Skin:    General: Skin is warm.  Neurological:     General: No focal deficit present.     Mental Status: She is alert and oriented to person, place, and time.  Psychiatric:        Mood and Affect: Mood normal.     ED Results / Procedures / Treatments   Labs (all labs ordered are listed, but only abnormal results are displayed) Labs Reviewed  CBC WITH DIFFERENTIAL/PLATELET - Abnormal; Notable for the following components:      Result Value   RBC 3.68 (*)    Hemoglobin 10.6 (*)    HCT 34.1 (*)    All other components within normal limits  COMPREHENSIVE METABOLIC PANEL - Abnormal; Notable for the following components:   BUN 32 (*)    Creatinine, Ser 1.12 (*)    AST 12 (*)    GFR calc non Af Amer 52  (*)    All other components within normal limits  D-DIMER, QUANTITATIVE (NOT AT [facility]) - Abnormal; Notable for the following components:   D-Dimer, Quant 2.09 (*)    All other components within normal limits  CBG MONITORING, ED - Abnormal; Notable for the following components:   Glucose-Capillary 63 (*)    All other components within normal limits  POC SARS CORONAVIRUS 2 AG -  ED  TROPONIN I (HIGH SENSITIVITY)  TROPONIN I (HIGH SENSITIVITY)    EKG EKG Interpretation  Date/Time:  Saturday October 10 2019 14:32:04 EDT Ventricular Rate:  86 PR Interval:    QRS Duration: 96 QT Interval:  374 QTC Calculation: 448 R Axis:   -34 Text Interpretation: Sinus rhythm Left axis deviation since last tracing no significant change Confirmed by Noemi Chapel [phone]) on 10/10/2019 2:37:56 PM   Radiology CT Angio Chest PE W and/or Wo Contrast  Result Date: 10/10/2019 CLINICAL DATA:  Shortness of breath. Chest pressure. EXAM: CT ANGIOGRAPHY CHEST WITH CONTRAST TECHNIQUE: Multidetector CT imaging of the chest was performed using the standard protocol during bolus administration of intravenous contrast. Multiplanar CT image reconstructions and MIPs were obtained to evaluate the vascular anatomy. CONTRAST:  172m OMNIPAQUE IOHEXOL 350 MG/ML SOLN COMPARISON:  None. FINDINGS: Cardiovascular: Heart is enlarged. Trace fluid superior pericardial recess. Aorta and main pulmonary artery normal in caliber. Adequate opacification of the main pulmonary artery. No filling defect identified to suggest acute pulmonary embolus. Mediastinum/Nodes: No enlarged axillary, mediastinal or hilar lymphadenopathy. Normal appearance of the esophagus. Lungs/Pleura: Central airways are patent. Scattered atelectasis within the lungs bilaterally. 4 mm right upper lobe nodule (image 77; series 6). 5 mm right lower lobe nodule (image 147; series 6). 5 mm right lower lobe nodule (image 104; series 6). Upper Abdomen: Unremarkable.  Musculoskeletal: Thoracic spine degenerative changes. No aggressive or acute appearing osseous lesions. Review of the MIP images confirms the above findings. IMPRESSION: 1. No evidence for acute pulmonary embolus. 2. No acute process within the chest. 3. Multiple pulmonary nodules measuring up to 5 mm. Non-contrast chest CT at 3-6 months is recommended. If the nodules are stable at time of repeat CT, then future CT at 18-24 months (from today's scan) is considered optional  for low-risk patients, but is recommended for high-risk patients. This recommendation follows the consensus statement: Guidelines for Management of Incidental Pulmonary Nodules Detected on CT Images: From the Fleischner Society 2017; Radiology 2017; 284:228-243. Electronically Signed   By: Lovey Newcomer M.D.   On: 10/10/2019 18:48   DG Chest Port 1 View  Result Date: 10/10/2019 CLINICAL DATA:  Cough, short of breath, hypertension EXAM: PORTABLE CHEST 1 VIEW COMPARISON:  None. FINDINGS: Single frontal view of the chest demonstrates an unremarkable cardiac silhouette. No acute airspace disease, effusion, or pneumothorax. No acute bony abnormalities. IMPRESSION: 1. No acute intrathoracic process. Electronically Signed   By: Randa Ngo M.D.   On: 10/10/2019 15:07    Procedures Procedures (including critical care time)  Medications Ordered in ED Medications  iohexol (OMNIPAQUE) 350 MG/ML injection 100 mL (100 mLs Intravenous Contrast Given 10/10/19 1817)    ED Course  I have reviewed the triage vital signs and the nursing notes.  Pertinent labs & imaging results that were available during my care of the patient were reviewed by me and considered in my medical decision making (see chart for details).    MDM Rules/Calculators/A&P                      MDM:  Marthann Schiller is normal   Pt has elevation of ddimer.  Ct scan shows no evidence of PE.  Ct shows pulmonary nodule.  Pt advised she needs repeat ct scan in 3-6 months.  Pt is scheduled  to see her MD next week.  Pt advised to have MD review Ct and schedule follow up as directed. Troponin negative x 2.  Pt able to eat and drink.  Vitals remain normal  Oxygen between 99-100 %.  Pt advised to continue current treatment.   Final Clinical Impression(s) / ED Diagnoses Final diagnoses:  SOB (shortness of breath)    Rx / DC Orders ED Discharge Orders    None    An After Visit Summary was printed and given to the patient.    Sidney Ace 10/10/19 2042    Virgel Manifold, MD 10/11/19 1504

## 2019-10-10 NOTE — ED Triage Notes (Signed)
Patient c/o shortness of breath that started approx 4 hours ago. Patient states unable to take a deep breath. Per patient chest pressure. Patient diagnosed with bronchitis on Monday. Patient tested for Covid and Flu-negative.  Patient given tessalon pearls and Augmentin. Patient states she did have some improvement.

## 2019-10-10 NOTE — Discharge Instructions (Addendum)
See your Physician for recheck.  Return if any problems.  Continue your current medications

## 2019-10-10 NOTE — ED Notes (Signed)
Pt getting dressed. Went over d/c papers. Pt called for ride

## 2019-11-10 ENCOUNTER — Other Ambulatory Visit: Payer: Self-pay

## 2019-11-10 ENCOUNTER — Ambulatory Visit (INDEPENDENT_AMBULATORY_CARE_PROVIDER_SITE_OTHER): Payer: Medicaid Other | Admitting: Ophthalmology

## 2019-11-10 ENCOUNTER — Encounter (INDEPENDENT_AMBULATORY_CARE_PROVIDER_SITE_OTHER): Payer: Self-pay | Admitting: Ophthalmology

## 2019-11-10 DIAGNOSIS — I10 Essential (primary) hypertension: Secondary | ICD-10-CM

## 2019-11-10 DIAGNOSIS — H35033 Hypertensive retinopathy, bilateral: Secondary | ICD-10-CM

## 2019-11-10 DIAGNOSIS — E113412 Type 2 diabetes mellitus with severe nonproliferative diabetic retinopathy with macular edema, left eye: Secondary | ICD-10-CM

## 2019-11-10 DIAGNOSIS — H47011 Ischemic optic neuropathy, right eye: Secondary | ICD-10-CM

## 2019-11-10 DIAGNOSIS — H3581 Retinal edema: Secondary | ICD-10-CM | POA: Diagnosis not present

## 2019-11-10 DIAGNOSIS — E113311 Type 2 diabetes mellitus with moderate nonproliferative diabetic retinopathy with macular edema, right eye: Secondary | ICD-10-CM | POA: Diagnosis not present
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
## 2019-11-13 ENCOUNTER — Other Ambulatory Visit: Payer: Self-pay

## 2019-11-13 ENCOUNTER — Encounter: Payer: Self-pay | Admitting: Cardiology

## 2019-11-13 ENCOUNTER — Ambulatory Visit (INDEPENDENT_AMBULATORY_CARE_PROVIDER_SITE_OTHER): Payer: Medicaid Other | Admitting: Cardiology

## 2019-11-13 VITALS — BP 136/78 | HR 88 | Temp 98.6°F | Ht 70.0 in

## 2019-11-13 DIAGNOSIS — R0602 Shortness of breath: Secondary | ICD-10-CM | POA: Diagnosis not present

## 2019-11-13 DIAGNOSIS — E1165 Type 2 diabetes mellitus with hyperglycemia: Secondary | ICD-10-CM

## 2019-11-13 DIAGNOSIS — G453 Amaurosis fugax: Secondary | ICD-10-CM | POA: Diagnosis not present

## 2019-11-13 DIAGNOSIS — I10 Essential (primary) hypertension: Secondary | ICD-10-CM | POA: Diagnosis not present

## 2019-11-13 NOTE — Patient Instructions (Signed)
Medication Instructions: Your physician recommends that you continue on your current medications as directed. Please refer to the Current Medication list given to you today.   Labwork: None today  Procedures/Testing: Your physician has requested that you have an echocardiogram. Echocardiography is a painless test that uses sound waves to create images of your heart. It provides your doctor with information about the size and shape of your heart and how well your heart's chambers and valves are working. This procedure takes approximately one hour. There are no restrictions for this procedure.  Your physician has requested that you have a carotid duplex. This test is an ultrasound of the carotid arteries in your neck. It looks at blood flow through these arteries that supply the brain with blood. Allow one hour for this exam. There are no restrictions or special instructions.    Follow-Up: We will call you with results.  Any Additional Special Instructions Will Be Listed Below (If Applicable).      Thank you for choosing [facility] !          If you need a refill on your cardiac medications before your next appointment, please call your pharmacy.

## 2019-11-13 NOTE — Progress Notes (Signed)
Cardiology Office Note  Date: 11/13/2019   ID: Suzanne Tran, Suzanne Tran January 23, 1956, MRN [id number]  PCP:  Suzanne Stabile, MD  Cardiologist:  Suzanne Dell, MD Electrophysiologist:  None   Chief Complaint  Patient presents with  . Shortness of Breath    History of Present Illness: Suzanne Tran is a 64 y.o. female referred for cardiology consultation by Dr. Margo Tran for evaluation of shortness of breath and fatigue.  Records indicate ER visit back in March with shortness of breath, she had been previously started on Augmentin for possible bronchitis.  ECG showed no acute ST segment changes, high-sensitivity troponin I levels were normal range x2.  D-dimer elevated to 2.09 but chest CTA did not demonstrate pulmonary embolus.  Incidental findings included cardiac enlargement and multiple small pulmonary nodules.  She presents today and we discussed her symptoms.  She feels breathless with activity, not at rest, describes NYHA class II-III symptoms.  This started in March.  She does feel like the symptoms improved somewhat after antibiotic treatment but never completely resolved.  She has no known history of chronic lung disease.  Does report sinus drainage and coughing when she first gets up in the morning.  She has not undergone PFTs previously.  No history of cardiomyopathy or ischemic heart disease.  Currently her right foot is in a protective boot with Charcot foot and a healing wound.  She is using a rolling walker today.  I reviewed her medications which are outlined below, also recent lab work.  Past Medical History:  Diagnosis Date  . CKD (chronic kidney disease) stage 3, GFR 30-59 ml/min   . Diabetic Charcot foot (HCC)   . Essential hypertension   . Mixed hyperlipidemia   . Nephrolithiasis   . OSA (obstructive sleep apnea)   . Type 2 diabetes mellitus (HCC)     Past Surgical History:  Procedure Laterality Date  . ANKLE SURGERY    . KNEE SURGERY    . REPLACEMENT TOTAL KNEE      . TONSILLECTOMY      Current Outpatient Medications  Medication Sig Dispense Refill  . albuterol (VENTOLIN HFA) 108 (90 Base) MCG/ACT inhaler SMARTSIG:2 Puff(s) By Mouth Every 6 Hours PRN    . ascorbic acid (VITAMIN C) 250 MG tablet Take by mouth.    . B Complex Vitamins (VITAMIN B-COMPLEX) TABS Take by mouth.    . insulin regular (NOVOLIN R) 100 units/mL injection Inject into the skin 3 (three) times daily before meals. Sliding scale    . LANTUS SOLOSTAR 100 UNIT/ML Solostar Pen INJECT 10 30 UNITS SUBCUTANEOUSLY ONCE DAILY    . losartan-hydrochlorothiazide (HYZAAR) 50-12.5 MG tablet Take 1 tablet by mouth daily.      No current facility-administered medications for this visit.   Allergies:  Codeine and Dilantin [phenytoin]   Social History: The patient  reports that she has never smoked. She has never used smokeless tobacco. She reports that she does not drink alcohol or use drugs.   Family History: The patient's family history includes CAD in her father; Cancer in her mother; Diabetes Mellitus II in her father and mother; Hypertension in her father.   ROS:   No description of orthopnea or PND.  Reports description of amaurosis involving the right eye and apparently retinal artery disease. She follows with an eye specialist.  She has not had carotid Dopplers.  Physical Exam: VS:  BP 136/78   Pulse 88   Temp 98.6 F (37 C)   Ht  5\' 10"  (1.778 m)   SpO2 95%   BMI 35.87 kg/m , BMI Body mass index is 35.87 kg/m.  Wt Readings from Last 3 Encounters:  10/10/19 250 lb (113.4 kg)  12/05/18 240 lb 11.9 oz (109.2 kg)  08/05/18 240 lb (108.9 kg)    General: Patient appears comfortable at rest. HEENT: Conjunctiva and lids normal, no mask. Neck: Supple, no elevated JVP or obvious carotid bruits, no thyromegaly. Lungs: Decreased breath sounds but no wheezing, nonlabored breathing at rest. Cardiac: Regular rate and rhythm, no S3 or significant systolic murmur, no pericardial  rub. Abdomen: Soft, nontender, bowel sounds present. Extremities: Right foot in protective boot. Skin: Warm and dry. Musculoskeletal: No kyphosis. Neuropsychiatric: Alert and oriented x3, affect grossly appropriate.  ECG:  An ECG dated 10/10/2019 was personally reviewed today and demonstrated:  Sinus rhythm with leftward axis, decreased R wave progression.  Recent Labwork: 10/10/2019: ALT 11; AST 12; BUN 32; Creatinine, Ser 1.12; Hemoglobin 10.6; Platelets 287; Potassium 4.2; Sodium 138  10/08/2019: Hemoglobin 10.7, platelets 308, BUN 35, creatinine 1.2, potassium 4.5, AST 11, ALT 8, cholesterol 166, triglycerides 90, HDL 46, LDL 103, hemoglobin A1c 7%  Other Studies Reviewed Today:  Chest CTA 10/10/2019: FINDINGS: Cardiovascular: Heart is enlarged. Trace fluid superior pericardial recess. Aorta and main pulmonary artery normal in caliber. Adequate opacification of the main pulmonary artery. No filling defect identified to suggest acute pulmonary embolus.  Mediastinum/Nodes: No enlarged axillary, mediastinal or hilar lymphadenopathy. Normal appearance of the esophagus.  Lungs/Pleura: Central airways are patent. Scattered atelectasis within the lungs bilaterally. 4 mm right upper lobe nodule (image 77; series 6). 5 mm right lower lobe nodule (image 147; series 6). 5 mm right lower lobe nodule (image 104; series 6).  Upper Abdomen: Unremarkable.  Musculoskeletal: Thoracic spine degenerative changes. No aggressive or acute appearing osseous lesions.  Review of the MIP images confirms the above findings.  IMPRESSION: 1. No evidence for acute pulmonary embolus. 2. No acute process within the chest. 3. Multiple pulmonary nodules measuring up to 5 mm. Non-contrast chest CT at 3-6 months is recommended. If the nodules are stable at time of repeat CT, then future CT at 18-24 months (from today's scan) is considered optional for low-risk patients, but is recommended for high-risk  patients. This recommendation follows the consensus statement: Guidelines for Management of Incidental Pulmonary Nodules Detected on CT Images: From the Fleischner Society 2017; Radiology 2017; 284:228-243.  Assessment and Plan:  1.  Dyspnea on exertion, etiology not yet clarified.  Reports episode of bronchitis in March with some improvement after antibiotics but not complete resolution.  Chest CTA at that time did not demonstrate pulmonary embolus but did show multiple small pulmonary nodules which will be reimaged later by Dr. April.  Heart described as "enlarged."  Otherwise her ECG was nonspecific and high-sensitivity troponin I levels were negative.  She does not describe definite angina symptoms.  Plan is to begin with an echocardiogram to assess cardiac structure and function.  If LVEF normal without wall motion abnormalities, we will likely pursue a Lexiscan Myoview next.  2.  Description of right sided amaurosis and apparent retinal artery disease, follows with an eye specialist.  She has not had carotid Dopplers and this will be arranged.  3.  Uncontrolled type 2 diabetes mellitus, recent hemoglobin A1c 7%.  She is following with Dr. Margo Tran and is on insulin at this time.  4.  Essential hypertension, currently on Hyzaar.  Systolic is in the 130s today.  No  changes were made as yet.  Medication Adjustments/Labs and Tests Ordered: Current medicines are reviewed at length with the patient today.  Concerns regarding medicines are outlined above.   Tests Ordered: Orders Placed This Encounter  Procedures  . US Carotid Duplex Bilateral  . ECHOCARDIOGRAM COMPLETE    Medication Changes: No orders of the defined types were placed in this encounter.   Disposition:  Follow up test results and determine next step.  Signed, Satira Sark, MD, [facility] 11/13/2019 2:11 PM    [facility] at [facility] [address], [place], [place] [address] Phone: [phone];  Fax: [phone]

## 2019-11-27 ENCOUNTER — Telehealth: Payer: Self-pay | Admitting: Cardiology

## 2019-11-27 NOTE — Telephone Encounter (Signed)
Labs routed to Dr. Diona Browner.

## 2019-11-27 NOTE — Telephone Encounter (Signed)
Scanned lab results from Dr. Margo Aye today- under media they are under date of 10/08/2019, did not route to Dr. Diona Browner- please make him aware of results

## 2019-12-03 ENCOUNTER — Ambulatory Visit (HOSPITAL_COMMUNITY)
Admission: RE | Admit: 2019-12-03 | Discharge: 2019-12-03 | Disposition: A | Discharge status: Home / Self Care | Payer: Medicare Other | Source: Ambulatory Visit | Attending: Cardiology | Admitting: Cardiology

## 2019-12-03 ENCOUNTER — Other Ambulatory Visit: Payer: Self-pay

## 2019-12-03 ENCOUNTER — Ambulatory Visit (HOSPITAL_COMMUNITY): Payer: Medicare Other

## 2019-12-03 DIAGNOSIS — R0602 Shortness of breath: Secondary | ICD-10-CM | POA: Diagnosis not present

## 2019-12-03 DIAGNOSIS — G453 Amaurosis fugax: Secondary | ICD-10-CM

## 2019-12-03 NOTE — Progress Notes (Signed)
*  PRELIMINARY RESULTS* Echocardiogram 2D Echocardiogram has been performed.  Stacey Drain 12/03/2019, 11:29 AM

## 2019-12-10 ENCOUNTER — Telehealth: Payer: Self-pay

## 2019-12-10 DIAGNOSIS — R0602 Shortness of breath: Secondary | ICD-10-CM

## 2019-12-10 NOTE — Telephone Encounter (Signed)
I spoke with patient, she agrees to have lexiscan.I have printed instruction letter for her.Chrys Racer will schedule apt and make f/u apt with Randall An, PA-C

## 2019-12-10 NOTE — Telephone Encounter (Signed)
-----   Message from Jonelle Sidle, MD sent at 12/03/2019  1:34 PM EDT ----- Results reviewed. Echocardiogram indicates mildly reduced LVEF at 45 to 50%, left atrium is also dilated which could explain increased heart size by recent imaging studies. Let's go ahead and schedule a Lexiscan Myoview for ischemic evaluation and then set up a follow-up with [place] in the next few weeks.

## 2019-12-10 NOTE — Telephone Encounter (Signed)
-----   Message from Samuel G McDowell, MD sent at 12/03/2019  1:34 PM EDT ----- Results reviewed. Echocardiogram indicates mildly reduced LVEF at 45 to 50%, left atrium is also dilated which could explain increased heart size by recent imaging studies. Let's go ahead and schedule a Lexiscan Myoview for ischemic evaluation and then set up a follow-up with [place] in the next few weeks. 

## 2020-01-01 ENCOUNTER — Encounter (HOSPITAL_BASED_OUTPATIENT_CLINIC_OR_DEPARTMENT_OTHER)
Admission: RE | Admit: 2020-01-01 | Discharge: 2020-01-01 | Disposition: A | Discharge status: Home / Self Care | Payer: Medicare Other | Source: Ambulatory Visit | Attending: Cardiology | Admitting: Cardiology

## 2020-01-01 ENCOUNTER — Encounter (HOSPITAL_COMMUNITY): Payer: Self-pay

## 2020-01-01 ENCOUNTER — Other Ambulatory Visit: Payer: Self-pay

## 2020-01-01 ENCOUNTER — Encounter (HOSPITAL_COMMUNITY)
Admission: RE | Admit: 2020-01-01 | Discharge: 2020-01-01 | Disposition: A | Discharge status: Home / Self Care | Payer: Medicare Other | Source: Ambulatory Visit | Attending: Cardiology | Admitting: Cardiology

## 2020-01-01 DIAGNOSIS — R0602 Shortness of breath: Secondary | ICD-10-CM | POA: Diagnosis not present

## 2020-01-01 LAB — NM MYOCAR MULTI W/SPECT W/WALL MOTION / EF
LV dias vol: 179 mL (ref 46–106)
LV sys vol: 122 mL
Peak HR: 83 {beats}/min
RATE: 0.64
Rest HR: 77 {beats}/min
SDS: 4
SRS: 7
SSS: 11
TID: 0.95

## 2020-01-01 MED ORDER — TECHNETIUM TC 99M TETROFOSMIN IV KIT
10.0000 | PACK | Freq: Once | INTRAVENOUS | Status: AC | PRN
  Administered 2020-01-01: 10.3 via INTRAVENOUS

## 2020-01-01 MED ORDER — REGADENOSON 0.4 MG/5ML IV SOLN
INTRAVENOUS | Status: AC
  Administered 2020-01-01: 0.4 mg via INTRAVENOUS
  Filled 2020-01-01: qty 5

## 2020-01-01 MED ORDER — TECHNETIUM TC 99M TETROFOSMIN IV KIT
30.0000 | PACK | Freq: Once | INTRAVENOUS | Status: AC | PRN
  Administered 2020-01-01: 30.6 via INTRAVENOUS

## 2020-01-01 MED ORDER — SODIUM CHLORIDE FLUSH 0.9 % IV SOLN
INTRAVENOUS | Status: AC
  Administered 2020-01-01: 10 mL via INTRAVENOUS
  Filled 2020-01-01: qty 10

## 2020-01-07 ENCOUNTER — Encounter: Payer: Self-pay | Admitting: Student

## 2020-01-07 ENCOUNTER — Ambulatory Visit (INDEPENDENT_AMBULATORY_CARE_PROVIDER_SITE_OTHER): Payer: Medicare Other | Admitting: Student

## 2020-01-07 ENCOUNTER — Other Ambulatory Visit: Payer: Self-pay

## 2020-01-07 VITALS — BP 138/80 | HR 85 | Ht 70.0 in | Wt 303.0 lb

## 2020-01-07 DIAGNOSIS — Z794 Long term (current) use of insulin: Secondary | ICD-10-CM

## 2020-01-07 DIAGNOSIS — R0600 Dyspnea, unspecified: Secondary | ICD-10-CM

## 2020-01-07 DIAGNOSIS — E1169 Type 2 diabetes mellitus with other specified complication: Secondary | ICD-10-CM

## 2020-01-07 DIAGNOSIS — I10 Essential (primary) hypertension: Secondary | ICD-10-CM

## 2020-01-07 DIAGNOSIS — I420 Dilated cardiomyopathy: Secondary | ICD-10-CM | POA: Diagnosis not present

## 2020-01-07 DIAGNOSIS — R0609 Other forms of dyspnea: Secondary | ICD-10-CM

## 2020-01-07 MED ORDER — ASPIRIN EC 81 MG PO TBEC: 81.0000 mg | DELAYED_RELEASE_TABLET | Freq: Every day | ORAL | 3 refills | Status: AC

## 2020-01-07 NOTE — Progress Notes (Signed)
Cardiology Office Note    Date:  01/07/2020   ID:  Suzanne Tran, DOB 08/05/1955, MRN [id number]  PCP:  Benita Stabile, MD  Cardiologist: Nona Dell, MD    Chief Complaint  Patient presents with  . Follow-up    recent echocardiogram and stress test    History of Present Illness:    Suzanne Tran is a 64 y.o. female with past medical history of HTN, HLD, Type 2 DM and Stage 3 CKD who presents to the office today for follow-up from her recent testing.   She was last examined by Dr. Diona Browner on 11/13/2019 as a new-patient referral for evaluation of dyspnea on exertion. She had previously been treated for bronchitis but did not experience full resolution of her symptoms. An echocardiogram was initially recommended which showed her EF was mildly reduced at 45-50% with moderate LVH and biatrial dilation. She did have mild MR but no significant valve abnormalities. Given her low-normal EF, a NST was arranged which showed extensive breast attenuation but no definitive ischemia or scar. Dr. Diona Browner over-read the study and mentioned anterolateral ischemia could not be excluded, therefore follow-up was arranged to discuss further ischemic testing with a cardiac catheterization.   In talking with the patient today, she reports still having dyspnea on exertion which is notable when walking from room to room in her home and with minimal activity. She denies any associated chest pain or palpitations. No diaphoresis, nausea or vomiting. No recent orthopnea or PND. She does experience lower extremity edema and takes Losartan-HCTZ on a daily basis.   She feels her dyspnea might be secondary to deconditioning as she was previously in a boot due to Charcot foot but this was recently removed and she is trying to increase her activity. She is planning to see her PCP in the coming weeks to discuss possible PT.    Past Medical History:  Diagnosis Date  . CKD (chronic kidney disease) stage 3, GFR 30-59  ml/min   . Diabetic Charcot foot (HCC)   . Essential hypertension   . Mixed hyperlipidemia   . Nephrolithiasis   . OSA (obstructive sleep apnea)   . Type 2 diabetes mellitus (HCC)     Past Surgical History:  Procedure Laterality Date  . ANKLE SURGERY    . KNEE SURGERY    . REPLACEMENT TOTAL KNEE    . TONSILLECTOMY      Current Medications: Outpatient Medications Prior to Visit  Medication Sig Dispense Refill  . albuterol (VENTOLIN HFA) 108 (90 Base) MCG/ACT inhaler SMARTSIG:2 Puff(s) By Mouth Every 6 Hours PRN    . ascorbic acid (VITAMIN C) 250 MG tablet Take by mouth.    . B Complex Vitamins (VITAMIN B-COMPLEX) TABS Take by mouth.    . insulin regular (NOVOLIN R) 100 units/mL injection Inject into the skin 3 (three) times daily before meals. Sliding scale    . LANTUS SOLOSTAR 100 UNIT/ML Solostar Pen INJECT 10 30 UNITS SUBCUTANEOUSLY ONCE DAILY    . losartan-hydrochlorothiazide (HYZAAR) 50-12.5 MG tablet Take 1 tablet by mouth daily.      No facility-administered medications prior to visit.     Allergies:   Codeine and Dilantin [phenytoin]   Social History   Socioeconomic History  . Marital status: Unknown    Spouse name: Not on file  . Number of children: Not on file  . Years of education: Not on file  . Highest education level: Not on file  Occupational History  . Not on  file  Tobacco Use  . Smoking status: Never Smoker  . Smokeless tobacco: Never Used  Vaping Use  . Vaping Use: Never assessed  Substance and Sexual Activity  . Alcohol use: Never  . Drug use: Never  . Sexual activity: Not on file  Other Topics Concern  . Not on file  Social History Narrative  . Not on file   Social Determinants of Health   Financial Resource Strain:   . Difficulty of Paying Living Expenses:   Food Insecurity:   . Worried About Programme researcher, broadcasting/film/video in the Last Year:   . Barista in the Last Year:   Transportation Needs:   . Freight forwarder (Medical):   Marland Kitchen  Lack of Transportation (Non-Medical):   Physical Activity:   . Days of Exercise per Week:   . Minutes of Exercise per Session:   Stress:   . Feeling of Stress :   Social Connections:   . Frequency of Communication with Friends and Family:   . Frequency of Social Gatherings with Friends and Family:   . Attends Religious Services:   . Active Member of Clubs or Organizations:   . Attends Banker Meetings:   Marland Kitchen Marital Status:      Family History:  The patient's family history includes CAD in her father; Cancer in her mother; Diabetes Mellitus II in her father and mother; Hypertension in her father.   Review of Systems:   Please see the history of present illness.     General:  No chills, fever, night sweats or weight changes. Positive for fatigue.  Cardiovascular:  No chest pain, edema, orthopnea, palpitations, paroxysmal nocturnal dyspnea. Positive for dyspnea on exertion.  Dermatological: No rash, lesions/masses Respiratory: No cough, dyspnea Urologic: No hematuria, dysuria Abdominal:   No nausea, vomiting, diarrhea, bright red blood per rectum, melena, or hematemesis Neurologic:  No visual changes, wkns, changes in mental status. All other systems reviewed and are otherwise negative except as noted above.   Physical Exam:    VS:  BP 138/80   Pulse 85   Ht 5\' 10"  (1.778 m)   Wt (!) 303 lb (137.4 kg)   SpO2 93%   BMI 43.48 kg/m    General: Well developed, overweight female appearing in no acute distress. Head: Normocephalic, atraumatic, sclera non-icteric.  Neck: No carotid bruits. JVD not elevated.  Lungs: Respirations regular and unlabored, without wheezes or rales.  Heart: Regular rate and rhythm. No S3 or S4.  No murmur, no rubs, or gallops appreciated. Abdomen: Soft, non-tender, non-distended. No obvious abdominal masses. Msk:  Strength and tone appear normal for age. No obvious joint deformities or effusions. Extremities: No clubbing or cyanosis. Trace  lower extremity edema bilaterally.  Distal pedal pulses are 2+ bilaterally. Neuro: Alert and oriented X 3. Moves all extremities spontaneously. No focal deficits noted. Psych:  Responds to questions appropriately with a normal affect. Skin: No rashes or lesions noted  Wt Readings from Last 3 Encounters:  01/07/20 (!) 303 lb (137.4 kg)  10/10/19 250 lb (113.4 kg)  12/05/18 240 lb 11.9 oz (109.2 kg)     Studies/Labs Reviewed:   EKG:  EKG is not ordered today.  Recent Labs: 10/10/2019: ALT 11; BUN 32; Creatinine, Ser 1.12; Hemoglobin 10.6; Platelets 287; Potassium 4.2; Sodium 138   Lipid Panel No results found for: CHOL, TRIG, HDL, CHOLHDL, VLDL, LDLCALC, LDLDIRECT  Additional studies/ records that were reviewed today include:   Echocardiogram: 11/2019 IMPRESSIONS  1. Left ventricular ejection fraction, by estimation, is 45 to 50%. The  left ventricle has mildly decreased function. Left ventricular endocardial  border not optimally defined to evaluate regional wall motion. There is  moderate left ventricular  hypertrophy. Left ventricular diastolic parameters are indeterminate.  Elevated left atrial pressure.  2. Right ventricular systolic function is normal. The right ventricular  size is normal. There is mildly elevated pulmonary artery systolic  pressure.  3. Left atrial size was severely dilated.  4. Right atrial size was mildly dilated.  5. The mitral valve is abnormal. Mild mitral valve regurgitation. No  evidence of mitral stenosis.  6. The aortic valve is tricuspid. Aortic valve regurgitation is not  visualized. No aortic stenosis is present.  7. The inferior vena cava is dilated in size with <50% respiratory  variability, suggesting right atrial pressure of 15 mmHg.   Carotid Dopplers: 11/2019 IMPRESSION: Color duplex indicates minimal heterogeneous and calcified plaque, with no hemodynamically significant stenosis by duplex criteria in the extracranial  cerebrovascular circulation.   NST: 12/2019  Electrically negative for ischemia  Probable normal perfusion and EXTENSIVE breast attenuation. No definite ischemia or scar. Would recomm echo if not done to confirm/clarify LVEF and wall motion.  LVEF calculated at 32%  High risk study due to LVEF REcomm echo if not done to evaluate wall motion/LVEF   Assessment:    1. Dyspnea on exertion   2. Dilated cardiomyopathy ([place])   3. Essential hypertension   4. Type 2 diabetes mellitus with other specified complication, with long-term current use of insulin ([place])      Plan:   In order of problems listed above:  1. Dyspnea on Exertion - She has experienced progressive symptoms for the past few months and recent echocardiogram showed her EF was mildly reduced at 45-50% and NST was sub-optimal as this showed extensive breast attenuation and anterolateral ischemia could not be excluded. I had previously reviewed her test results with Dr. Domenic Polite who recommended a catheterization if she had persistent symptoms as she has multiple cardiac risk factors (HTN, HLD, Type 2 DM and family history of CAD). We reviewed the procedure but the patient is very anxious about undergoing any type of invasive procedures at this time. She feels like her dyspnea on exertion is secondary to deconditioning which is certainly a possibility given her decreased activity over the past year in the setting of Charcot foot. Therefore, our conversation transitioned to medical therapy. She does not wish to start Imdur or BB therapy but is in agreement to starting ASA 81mg  daily. She wishes to increase her activity over the next few months and if no improvement in her symptoms, will then readdress a possible catheterization. I encouraged her to make [place] aware if symptoms change in the interim or she decides to pursue a catheterization prior to her next visit.   2. Dilated Cardiomyopathy - Recent echocardiogram showed her EF was  mildly reduced at 45-50% with moderate LVH and biatrial dilation. Reviewed echocardiogram results in detail with the patient today. She denies any specific orthopnea or PND but does have dyspnea on exertion. She wishes to hold off on additional ischemic testing at this time as outlined above.  - Continue Losartan-HCTZ 50-12.5mg  daily. Would consider the addition of a BB in the future but will make gradual medication changes.   3. HTN - BP is at 138/80 during today's visit. Continue current medication regimen with Losartan-HCTZ 50-12.5mg  daily. Creatinine was stable at 1.12 by recent labs  on 10/10/2019.   4. Type 2 DM - Followed by her PCP. Hgb A1c was at 7.0 by recent check in 09/2019.   Medication Adjustments/Labs and Tests Ordered: Current medicines are reviewed at length with the patient today.  Concerns regarding medicines are outlined above.  Medication changes, Labs and Tests ordered today are listed in the Patient Instructions below. Patient Instructions  Medication Instructions:  START Aspirin 81 mg daily     *If you need a refill on your cardiac medications before your next appointment, please call your pharmacy*   Lab Work: None today If you have labs (blood work) drawn today and your tests are completely normal, you will receive your results only by: Marland Kitchen MyChart Message (if you have MyChart) OR . A paper copy in the mail If you have any lab test that is abnormal or we need to change your treatment, we will call you to review the results.   Testing/Procedures: None today   Follow-Up: At [facility], you and your health needs are our priority.  As part of our continuing mission to provide you with exceptional heart care, we have created designated Provider Care Teams.  These Care Teams include your primary Cardiologist (physician) and Advanced Practice Providers (APPs -  Physician Assistants and Nurse Practitioners) who all work together to provide you with the care you  need, when you need it.  We recommend signing up for the patient portal called "MyChart".  Sign up information is provided on this After Visit Summary.  MyChart is used to connect with patients for Virtual Visits (Telemedicine).  Patients are able to view lab/test results, encounter notes, upcoming appointments, etc.  Non-urgent messages can be sent to your provider as well.   To learn more about what you can do with MyChart, go to [email or web address].    Your next appointment:   2 month(s)  The format for your next appointment:   In Person  Provider:   Nona Dell, MD   Other Instructions None      Thank you for choosing [facility] !            Signed, Ellsworth Lennox, PA-C  01/07/2020 8:24 PM    [facility] [address] [place], [place] [address] Phone: [phone] Fax: [phone]

## 2020-01-07 NOTE — Patient Instructions (Signed)
Medication Instructions:  START Aspirin 81 mg daily     *If you need a refill on your cardiac medications before your next appointment, please call your pharmacy*   Lab Work: None today If you have labs (blood work) drawn today and your tests are completely normal, you will receive your results only by: Marland Kitchen MyChart Message (if you have MyChart) OR . A paper copy in the mail If you have any lab test that is abnormal or we need to change your treatment, we will call you to review the results.   Testing/Procedures: None today   Follow-Up: At [facility], you and your health needs are our priority.  As part of our continuing mission to provide you with exceptional heart care, we have created designated Provider Care Teams.  These Care Teams include your primary Cardiologist (physician) and Advanced Practice Providers (APPs -  Physician Assistants and Nurse Practitioners) who all work together to provide you with the care you need, when you need it.  We recommend signing up for the patient portal called "MyChart".  Sign up information is provided on this After Visit Summary.  MyChart is used to connect with patients for Virtual Visits (Telemedicine).  Patients are able to view lab/test results, encounter notes, upcoming appointments, etc.  Non-urgent messages can be sent to your provider as well.   To learn more about what you can do with MyChart, go to [email or web address].    Your next appointment:   2 month(s)  The format for your next appointment:   In Person  Provider:   Nona Dell, MD   Other Instructions None      Thank you for choosing [facility] !

## 2020-02-28 NOTE — Progress Notes (Signed)
Cardiology Office Note  Date: 02/29/2020   ID: Avion Patella, DOB Jan 01, 1956, MRN [id number]  PCP:  Benita Stabile, MD  Cardiologist:  Nona Dell, MD Electrophysiologist:  None   Chief Complaint  Patient presents with  . Cardiac follow-up    History of Present Illness: Suzanne Tran is a 64 y.o. female last seen in June by Ms. Strader PA-C.  She presents for a follow-up visit.  She still has a cast on her right foot, using a rolling walker and fairly limited in terms of ambulation.  She has not been able to exercise regularly and has gained additional weight.  As per last office note, diagnostic cardiac catheterization was discussed for further assessment of dyspnea on exertion and in light of her noninvasive cardiac test results and cardiac risk factors.  She preferred medical therapy at that time.  Today we had a long discussion about the results of her testing, implications, and treatment options including medical therapy adjustments versus invasive testing.  I get the definite impression in talking with her that she is hesitant to make adjustments in her medications and she is also very concerned about pursuing any invasive cardiac work-up.  Per discussion today, she has decided to continue the present course of observation.  I did  discuss rationale for low-dose aspirin and she is already taking Hyzaar.  I also talked with her about considering low-dose Coreg, but she did not want to start anything new.  Past Medical History:  Diagnosis Date  . CKD (chronic kidney disease) stage 3, GFR 30-59 ml/min   . Diabetic Charcot foot (HCC)   . Essential hypertension   . Mixed hyperlipidemia   . Nephrolithiasis   . OSA (obstructive sleep apnea)   . Type 2 diabetes mellitus (HCC)     Past Surgical History:  Procedure Laterality Date  . ANKLE SURGERY    . KNEE SURGERY    . REPLACEMENT TOTAL KNEE    . TONSILLECTOMY      Current Outpatient Medications  Medication Sig Dispense  Refill  . albuterol (VENTOLIN HFA) 108 (90 Base) MCG/ACT inhaler SMARTSIG:2 Puff(s) By Mouth Every 6 Hours PRN    . ascorbic acid (VITAMIN C) 250 MG tablet Take by mouth.    Marland Kitchen aspirin EC 81 MG tablet Take 1 tablet (81 mg total) by mouth daily. Swallow whole. 90 tablet 3  . B Complex Vitamins (VITAMIN B-COMPLEX) TABS Take by mouth.    . furosemide (LASIX) 20 MG tablet Take 20 mg by mouth daily.    . insulin regular (NOVOLIN R) 100 units/mL injection Inject into the skin 3 (three) times daily before meals. Sliding scale    . LANTUS SOLOSTAR 100 UNIT/ML Solostar Pen INJECT 10 30 UNITS SUBCUTANEOUSLY ONCE DAILY    . losartan-hydrochlorothiazide (HYZAAR) 50-12.5 MG tablet Take 1 tablet by mouth daily.     . potassium chloride (KLOR-CON) 10 MEQ tablet Take 10 mEq by mouth daily.     No current facility-administered medications for this visit.   Allergies:  Codeine and Dilantin [phenytoin]   ROS:   No palpitations or syncope.  Physical Exam: VS:  BP 140/78   Pulse 80   Ht 5' 10.8" (1.798 m)   Wt (!) 328 lb (148.8 kg)   SpO2 95%   BMI 46.01 kg/m , BMI Body mass index is 46.01 kg/m.  Wt Readings from Last 3 Encounters:  02/29/20 (!) 328 lb (148.8 kg)  01/07/20 (!) 303 lb (137.4 kg)  10/10/19  250 lb (113.4 kg)    General: Patient appears comfortable at rest. HEENT: Conjunctiva and lids normal, wearing a mask. Neck: Supple, no elevated JVP or carotid bruits, no thyromegaly. Lungs: Clear to auscultation, nonlabored breathing at rest. Cardiac: Regular rate and rhythm, no S3, 2/6 systolic murmur. Extremities: Cast on right foot and ankle.  ECG:  An ECG dated 10/10/2019 was personally reviewed today and demonstrated:  Sinus rhythm with leftward axis, decreased R wave progression.  Recent Labwork: 10/10/2019: ALT 11; AST 12; BUN 32; Creatinine, Ser 1.12; Hemoglobin 10.6; Platelets 287; Potassium 4.2; Sodium 138   Other Studies Reviewed Today:  Echocardiogram 12/03/2019: 1. Left  ventricular ejection fraction, by estimation, is 45 to 50%. The  left ventricle has mildly decreased function. Left ventricular endocardial  border not optimally defined to evaluate regional wall motion. There is  moderate left ventricular  hypertrophy. Left ventricular diastolic parameters are indeterminate.  Elevated left atrial pressure.  2. Right ventricular systolic function is normal. The right ventricular  size is normal. There is mildly elevated pulmonary artery systolic  pressure.  3. Left atrial size was severely dilated.  4. Right atrial size was mildly dilated.  5. The mitral valve is abnormal. Mild mitral valve regurgitation. No  evidence of mitral stenosis.  6. The aortic valve is tricuspid. Aortic valve regurgitation is not  visualized. No aortic stenosis is present.  7. The inferior vena cava is dilated in size with <50% respiratory  variability, suggesting right atrial pressure of 15 mmHg.   Lexiscan Myoview 01/01/2020:  Electrically negative for ischemia  Probable normal perfusion and EXTENSIVE breast attenuation. No definite ischemia or scar. Would recomm echo if not done to confirm/clarify LVEF and wall motion.  LVEF calculated at 32%  High risk study due to LVEF REcomm echo if not done to evaluate wall motion/LVEF  Assessment and Plan:  1.  Cardiomyopathy, LVEF 45 to 50% by echocardiogram, calculated lower by Myoview.  Perfusion imaging showed breast attenuation, somewhat variable and cannot completely exclude anterolateral ischemia.  We have discussed the possibility of a diagnostic cardiac catheterization and she declines at this point.  We have also discussed medical therapy options, she has been hesitant to make any new additions.  I talked with her about adding low-dose Coreg and she would like to hold off.  Continue aspirin and Hyzaar.  If the situation changes we can revisit these issues.  2.  Uncontrolled type 2 diabetes mellitus.  She follows with  Dr. Margo Aye and continues on insulin.  Last hemoglobin A1c 7%.  Medication Adjustments/Labs and Tests Ordered: Current medicines are reviewed at length with the patient today.  Concerns regarding medicines are outlined above.   Tests Ordered: No orders of the defined types were placed in this encounter.   Medication Changes: No orders of the defined types were placed in this encounter.   Disposition:  Follow up 6 months in the [place] office.  Signed, Jonelle Sidle, MD, [facility] 02/29/2020 2:41 PM    [facility] at [facility] [address] [place], [place] [address] Phone: [phone]; Fax: [phone]

## 2020-02-29 ENCOUNTER — Other Ambulatory Visit: Payer: Self-pay

## 2020-02-29 ENCOUNTER — Encounter: Payer: Self-pay | Admitting: Cardiology

## 2020-02-29 ENCOUNTER — Ambulatory Visit (INDEPENDENT_AMBULATORY_CARE_PROVIDER_SITE_OTHER): Payer: Medicare Other | Admitting: Cardiology

## 2020-02-29 VITALS — BP 140/78 | HR 80 | Ht 70.8 in | Wt 328.0 lb

## 2020-02-29 DIAGNOSIS — I429 Cardiomyopathy, unspecified: Secondary | ICD-10-CM | POA: Diagnosis not present

## 2020-02-29 DIAGNOSIS — R0600 Dyspnea, unspecified: Secondary | ICD-10-CM | POA: Diagnosis not present

## 2020-02-29 DIAGNOSIS — E1165 Type 2 diabetes mellitus with hyperglycemia: Secondary | ICD-10-CM | POA: Diagnosis not present

## 2020-02-29 DIAGNOSIS — R0609 Other forms of dyspnea: Secondary | ICD-10-CM

## 2020-02-29 NOTE — Patient Instructions (Signed)
Medication Instructions:  °Your physician recommends that you continue on your current medications as directed. Please refer to the Current Medication list given to you today. ° °*If you need a refill on your cardiac medications before your next appointment, please call your pharmacy* ° ° °Lab Work: °None today °If you have labs (blood work) drawn today and your tests are completely normal, you will receive your results only by: °• MyChart Message (if you have MyChart) OR °• A paper copy in the mail °If you have any lab test that is abnormal or we need to change your treatment, we will call you to review the results. ° ° °Testing/Procedures: °None today ° ° °Follow-Up: °At [facility], you and your health needs are our priority.  As part of our continuing mission to provide you with exceptional heart care, we have created designated Provider Care Teams.  These Care Teams include your primary Cardiologist (physician) and Advanced Practice Providers (APPs -  Physician Assistants and Nurse Practitioners) who all work together to provide you with the care you need, when you need it. ° °We recommend signing up for the patient portal called "MyChart".  Sign up information is provided on this After Visit Summary.  MyChart is used to connect with patients for Virtual Visits (Telemedicine).  Patients are able to view lab/test results, encounter notes, upcoming appointments, etc.  Non-urgent messages can be sent to your provider as well.   °To learn more about what you can do with MyChart, go to [email or web address].   ° °Your next appointment:   °6 month(s) ° °The format for your next appointment:   °In Person ° °Provider:   °Samuel McDowell, MD ° ° °Other Instructions °None ° ° ° ° °Thank you for choosing [facility] ! ° ° ° ° ° ° ° ° °

## 2020-03-19 ENCOUNTER — Other Ambulatory Visit: Payer: Self-pay

## 2020-03-19 ENCOUNTER — Inpatient Hospital Stay (HOSPITAL_COMMUNITY)
Admission: EM | Admit: 2020-03-19 | Discharge: 2020-03-31 | DRG: 291 | Disposition: A | Discharge status: Skilled Nursing Facility | Payer: Medicare Other | Attending: Internal Medicine | Admitting: Internal Medicine

## 2020-03-19 ENCOUNTER — Emergency Department (HOSPITAL_COMMUNITY): Payer: Medicare Other

## 2020-03-19 ENCOUNTER — Encounter (HOSPITAL_COMMUNITY): Payer: Self-pay

## 2020-03-19 DIAGNOSIS — E11610 Type 2 diabetes mellitus with diabetic neuropathic arthropathy: Secondary | ICD-10-CM | POA: Diagnosis present

## 2020-03-19 DIAGNOSIS — M21969 Unspecified acquired deformity of unspecified lower leg: Secondary | ICD-10-CM | POA: Diagnosis present

## 2020-03-19 DIAGNOSIS — Z833 Family history of diabetes mellitus: Secondary | ICD-10-CM

## 2020-03-19 DIAGNOSIS — J9601 Acute respiratory failure with hypoxia: Secondary | ICD-10-CM | POA: Diagnosis present

## 2020-03-19 DIAGNOSIS — E876 Hypokalemia: Secondary | ICD-10-CM | POA: Diagnosis present

## 2020-03-19 DIAGNOSIS — R601 Generalized edema: Secondary | ICD-10-CM

## 2020-03-19 DIAGNOSIS — Z20822 Contact with and (suspected) exposure to covid-19: Secondary | ICD-10-CM | POA: Diagnosis present

## 2020-03-19 DIAGNOSIS — E11621 Type 2 diabetes mellitus with foot ulcer: Secondary | ICD-10-CM | POA: Diagnosis present

## 2020-03-19 DIAGNOSIS — E1142 Type 2 diabetes mellitus with diabetic polyneuropathy: Secondary | ICD-10-CM | POA: Diagnosis present

## 2020-03-19 DIAGNOSIS — I130 Hypertensive heart and chronic kidney disease with heart failure and stage 1 through stage 4 chronic kidney disease, or unspecified chronic kidney disease: Principal | ICD-10-CM | POA: Diagnosis present

## 2020-03-19 DIAGNOSIS — I5023 Acute on chronic systolic (congestive) heart failure: Secondary | ICD-10-CM | POA: Diagnosis present

## 2020-03-19 DIAGNOSIS — I160 Hypertensive urgency: Secondary | ICD-10-CM | POA: Diagnosis present

## 2020-03-19 DIAGNOSIS — Z6841 Body Mass Index (BMI) 40.0 and over, adult: Secondary | ICD-10-CM

## 2020-03-19 DIAGNOSIS — M6281 Muscle weakness (generalized): Secondary | ICD-10-CM | POA: Diagnosis present

## 2020-03-19 DIAGNOSIS — E119 Type 2 diabetes mellitus without complications: Secondary | ICD-10-CM

## 2020-03-19 DIAGNOSIS — E1122 Type 2 diabetes mellitus with diabetic chronic kidney disease: Secondary | ICD-10-CM | POA: Diagnosis present

## 2020-03-19 DIAGNOSIS — K8020 Calculus of gallbladder without cholecystitis without obstruction: Secondary | ICD-10-CM | POA: Diagnosis present

## 2020-03-19 DIAGNOSIS — R319 Hematuria, unspecified: Secondary | ICD-10-CM

## 2020-03-19 DIAGNOSIS — N183 Chronic kidney disease, stage 3 unspecified: Secondary | ICD-10-CM | POA: Diagnosis present

## 2020-03-19 DIAGNOSIS — E1165 Type 2 diabetes mellitus with hyperglycemia: Secondary | ICD-10-CM | POA: Diagnosis present

## 2020-03-19 DIAGNOSIS — Z809 Family history of malignant neoplasm, unspecified: Secondary | ICD-10-CM

## 2020-03-19 DIAGNOSIS — Z794 Long term (current) use of insulin: Secondary | ICD-10-CM

## 2020-03-19 DIAGNOSIS — N1831 Chronic kidney disease, stage 3a: Secondary | ICD-10-CM | POA: Diagnosis present

## 2020-03-19 DIAGNOSIS — Z96659 Presence of unspecified artificial knee joint: Secondary | ICD-10-CM | POA: Diagnosis present

## 2020-03-19 DIAGNOSIS — Q443 Congenital stenosis and stricture of bile ducts: Secondary | ICD-10-CM | POA: Diagnosis not present

## 2020-03-19 DIAGNOSIS — R162 Hepatomegaly with splenomegaly, not elsewhere classified: Secondary | ICD-10-CM | POA: Diagnosis present

## 2020-03-19 DIAGNOSIS — N390 Urinary tract infection, site not specified: Secondary | ICD-10-CM | POA: Diagnosis present

## 2020-03-19 DIAGNOSIS — L97519 Non-pressure chronic ulcer of other part of right foot with unspecified severity: Secondary | ICD-10-CM | POA: Diagnosis present

## 2020-03-19 DIAGNOSIS — I5021 Acute systolic (congestive) heart failure: Secondary | ICD-10-CM | POA: Diagnosis not present

## 2020-03-19 DIAGNOSIS — R2689 Other abnormalities of gait and mobility: Secondary | ICD-10-CM | POA: Diagnosis present

## 2020-03-19 DIAGNOSIS — Z79899 Other long term (current) drug therapy: Secondary | ICD-10-CM

## 2020-03-19 DIAGNOSIS — Z7982 Long term (current) use of aspirin: Secondary | ICD-10-CM

## 2020-03-19 DIAGNOSIS — G4733 Obstructive sleep apnea (adult) (pediatric): Secondary | ICD-10-CM | POA: Diagnosis present

## 2020-03-19 DIAGNOSIS — E782 Mixed hyperlipidemia: Secondary | ICD-10-CM | POA: Diagnosis present

## 2020-03-19 DIAGNOSIS — D649 Anemia, unspecified: Secondary | ICD-10-CM | POA: Diagnosis present

## 2020-03-19 DIAGNOSIS — L97511 Non-pressure chronic ulcer of other part of right foot limited to breakdown of skin: Secondary | ICD-10-CM | POA: Diagnosis not present

## 2020-03-19 DIAGNOSIS — Z87442 Personal history of urinary calculi: Secondary | ICD-10-CM

## 2020-03-19 DIAGNOSIS — Z8249 Family history of ischemic heart disease and other diseases of the circulatory system: Secondary | ICD-10-CM

## 2020-03-19 DIAGNOSIS — E118 Type 2 diabetes mellitus with unspecified complications: Secondary | ICD-10-CM | POA: Diagnosis present

## 2020-03-19 DIAGNOSIS — K8010 Calculus of gallbladder with chronic cholecystitis without obstruction: Secondary | ICD-10-CM | POA: Diagnosis present

## 2020-03-19 HISTORY — DX: Charcot's joint, right ankle and foot: M14.671

## 2020-03-19 HISTORY — DX: Polyneuropathy, unspecified: G62.9

## 2020-03-19 LAB — COMPREHENSIVE METABOLIC PANEL
ALT: 12 U/L (ref 0–44)
AST: 16 U/L (ref 15–41)
Albumin: 3.2 g/dL — ABNORMAL LOW (ref 3.5–5.0)
Alkaline Phosphatase: 52 U/L (ref 38–126)
Anion gap: 9 (ref 5–15)
BUN: 41 mg/dL — ABNORMAL HIGH (ref 8–23)
CO2: 23 mmol/L (ref 22–32)
Calcium: 9 mg/dL (ref 8.9–10.3)
Chloride: 107 mmol/L (ref 98–111)
Creatinine, Ser: 1.25 mg/dL — ABNORMAL HIGH (ref 0.44–1.00)
GFR calc Af Amer: 52 mL/min — ABNORMAL LOW (ref >60)
GFR calc non Af Amer: 45 mL/min — ABNORMAL LOW (ref >60)
Glucose, Bld: 150 mg/dL — ABNORMAL HIGH (ref 70–99)
Potassium: 4.5 mmol/L (ref 3.5–5.1)
Sodium: 139 mmol/L (ref 135–145)
Total Bilirubin: 0.4 mg/dL (ref 0.3–1.2)
Total Protein: 6.9 g/dL (ref 6.5–8.1)

## 2020-03-19 LAB — LIPASE, BLOOD: Lipase: 19 U/L (ref 11–51)

## 2020-03-19 LAB — CBC
HCT: 33.4 % — ABNORMAL LOW (ref 36.0–46.0)
Hemoglobin: 10.1 g/dL — ABNORMAL LOW (ref 12.0–15.0)
MCH: 26.8 pg (ref 26.0–34.0)
MCHC: 30.2 g/dL (ref 30.0–36.0)
MCV: 88.6 fL (ref 80.0–100.0)
Platelets: 244 10*3/uL (ref 150–400)
RBC: 3.77 MIL/uL — ABNORMAL LOW (ref 3.87–5.11)
RDW: 15.6 % — ABNORMAL HIGH (ref 11.5–15.5)
WBC: 5.6 10*3/uL (ref 4.0–10.5)
nRBC: 0 % (ref 0.0–0.2)

## 2020-03-19 LAB — URINALYSIS, ROUTINE W REFLEX MICROSCOPIC
Bacteria, UA: NONE SEEN
Bilirubin Urine: NEGATIVE
Glucose, UA: NEGATIVE mg/dL
Ketones, ur: NEGATIVE mg/dL
Leukocytes,Ua: NEGATIVE
Nitrite: NEGATIVE
Protein, ur: 100 mg/dL — AB
Specific Gravity, Urine: 1.009 (ref 1.005–1.030)
pH: 6 (ref 5.0–8.0)

## 2020-03-19 LAB — TROPONIN I (HIGH SENSITIVITY)
Troponin I (High Sensitivity): 16 ng/L (ref <18)
Troponin I (High Sensitivity): 17 ng/L (ref <18)

## 2020-03-19 LAB — BRAIN NATRIURETIC PEPTIDE: B Natriuretic Peptide: 525 pg/mL — ABNORMAL HIGH (ref 0.0–100.0)

## 2020-03-19 LAB — PROTIME-INR
INR: 1.2 (ref 0.8–1.2)
Prothrombin Time: 14.7 seconds (ref 11.4–15.2)

## 2020-03-19 LAB — LACTIC ACID, PLASMA: Lactic Acid, Venous: 1.1 mmol/L (ref 0.5–1.9)

## 2020-03-19 LAB — SARS CORONAVIRUS 2 BY RT PCR (HOSPITAL ORDER, PERFORMED IN ~~LOC~~ HOSPITAL LAB): SARS Coronavirus 2: NEGATIVE

## 2020-03-19 LAB — CBG MONITORING, ED: Glucose-Capillary: 119 mg/dL — ABNORMAL HIGH (ref 70–99)

## 2020-03-19 MED ORDER — HYDRALAZINE HCL 25 MG PO TABS: 25.0000 mg | ORAL_TABLET | Freq: Four times a day (QID) | ORAL | Status: DC | PRN

## 2020-03-19 MED ORDER — FUROSEMIDE 10 MG/ML IJ SOLN
40.0000 mg | Freq: Once | INTRAMUSCULAR | Status: AC
  Administered 2020-03-19: 40 mg via INTRAVENOUS
  Filled 2020-03-19: qty 4

## 2020-03-19 NOTE — ED Provider Notes (Signed)
AP-EMERGENCY DEPT [facility] Emergency Department Provider Note MRN:  [id number]  Arrival date & time: 03/19/20     Chief Complaint   Shortness of Breath and abdominal swelling   History of Present Illness   Suzanne Tran is a 64 y.o. year-old female with a history of DM presenting to the ED with chief complaint of swelling.  Worsening abdominal swelling and leg swelling for the past 4 days.  Abdomen is so full that it is starting to make her SOB.  Denies chest pain.  No fever or cough.  Does not drink.  Mild abdominal discomfort.  No n/v/d.  Symptoms moderate, constant, no exacerbating or alleviating symptoms.    Review of Systems  A complete 10 system review of systems was obtained and all systems are negative except as noted in the HPI and PMH.   Patient's Health History    Past Medical History:  Diagnosis Date  . Charcot ankle, right   . CKD (chronic kidney disease) stage 3, GFR 30-59 ml/min   . Diabetic Charcot foot (HCC)   . Essential hypertension   . Mixed hyperlipidemia   . Nephrolithiasis   . Neuropathy   . OSA (obstructive sleep apnea)   . Type 2 diabetes mellitus (HCC)     Past Surgical History:  Procedure Laterality Date  . ANKLE SURGERY    . KNEE SURGERY    . REPLACEMENT TOTAL KNEE    . TONSILLECTOMY      Family History  Problem Relation Age of Onset  . Diabetes Mellitus II Mother   . Cancer Mother   . Hypertension Father   . CAD Father   . Diabetes Mellitus II Father   . Diabetes Sister   . Diabetes Brother     Social History   Socioeconomic History  . Marital status: Unknown    Spouse name: Not on file  . Number of children: Not on file  . Years of education: Not on file  . Highest education level: Not on file  Occupational History  . Not on file  Tobacco Use  . Smoking status: Never Smoker  . Smokeless tobacco: Never Used  Vaping Use  . Vaping Use: Never used  Substance and Sexual Activity  . Alcohol use: Never  . Drug use:  Never  . Sexual activity: Not on file  Other Topics Concern  . Not on file  Social History Narrative  . Not on file   Social Determinants of Health   Financial Resource Strain:   . Difficulty of Paying Living Expenses: Not on file  Food Insecurity:   . Worried About Programme researcher, broadcasting/film/video in the Last Year: Not on file  . Ran Out of Food in the Last Year: Not on file  Transportation Needs:   . Lack of Transportation (Medical): Not on file  . Lack of Transportation (Non-Medical): Not on file  Physical Activity:   . Days of Exercise per Week: Not on file  . Minutes of Exercise per Session: Not on file  Stress:   . Feeling of Stress : Not on file  Social Connections:   . Frequency of Communication with Friends and Family: Not on file  . Frequency of Social Gatherings with Friends and Family: Not on file  . Attends Religious Services: Not on file  . Active Member of Clubs or Organizations: Not on file  . Attends Banker Meetings: Not on file  . Marital Status: Not on file  Intimate Partner Violence:   .  Fear of Current or Ex-Partner: Not on file  . Emotionally Abused: Not on file  . Physically Abused: Not on file  . Sexually Abused: Not on file     Physical Exam   Vitals:   03/19/20 2030 03/19/20 2100  BP: (!) 175/94 (!) 182/91  Pulse: 86 90  Resp: (!) 28 (!) 30  Temp:    SpO2: 98% 94%    CONSTITUTIONAL:  Chronically ill-appearing, NAD NEURO:  Alert and oriented x 3, no focal deficits EYES:  eyes equal and reactive ENT/NECK:  no LAD, no JVD CARDIO:  regular rate, well-perfused, normal S1 and S2 PULM:  CTAB no wheezing or rhonchi, mildly tachypneic GI/GU:  normal bowel sounds, non-distended, non-tender MSK/SPINE:  No gross deformities, anasarca up to the mid abdomen SKIN:  no rash, atraumatic PSYCH:  Appropriate speech and behavior  *Additional and/or pertinent findings included in MDM below  Diagnostic and Interventional Summary    EKG  Interpretation  Date/Time:  Saturday March 19 2020 18:01:32 EDT Ventricular Rate:  87 PR Interval:    QRS Duration: 114 QT Interval:  359 QTC Calculation: 432 R Axis:   -44 Text Interpretation: Sinus rhythm Borderline IVCD with LAD Low voltage, precordial leads Consider anterior infarct Confirmed by Kennis Carina [phone]) on 03/19/2020 6:19:25 PM      Labs Reviewed  CBC - Abnormal; Notable for the following components:      Result Value   RBC 3.77 (*)    Hemoglobin 10.1 (*)    HCT 33.4 (*)    RDW 15.6 (*)    All other components within normal limits  COMPREHENSIVE METABOLIC PANEL - Abnormal; Notable for the following components:   Glucose, Bld 150 (*)    BUN 41 (*)    Creatinine, Ser 1.25 (*)    Albumin 3.2 (*)    GFR calc non Af Amer 45 (*)    GFR calc Af Amer 52 (*)    All other components within normal limits  URINALYSIS, ROUTINE W REFLEX MICROSCOPIC - Abnormal; Notable for the following components:   Color, Urine STRAW (*)    Hgb urine dipstick SMALL (*)    Protein, ur 100 (*)    All other components within normal limits  BRAIN NATRIURETIC PEPTIDE - Abnormal; Notable for the following components:   B Natriuretic Peptide 525.0 (*)    All other components within normal limits  SARS CORONAVIRUS 2 BY RT PCR (HOSPITAL ORDER, PERFORMED IN [facility])  LIPASE, BLOOD  LACTIC ACID, PLASMA  PROTIME-INR  HEPATITIS PANEL, ACUTE  TROPONIN I (HIGH SENSITIVITY)  TROPONIN I (HIGH SENSITIVITY)    DG Chest Port 1 View  Final Result    CT ABDOMEN PELVIS WO CONTRAST  Final Result      Medications  hydrALAZINE (APRESOLINE) tablet 25 mg (has no administration in time range)  furosemide (LASIX) injection 40 mg (40 mg Intravenous Given 03/19/20 2051)     Procedures  /  Critical Care Procedures  ED Course and Medical Decision Making  I have reviewed the triage vital signs, the nursing notes, and pertinent available records from the EMR.  Listed above are  laboratory and imaging tests that I personally ordered, reviewed, and interpreted and then considered in my medical decision making (see below for details).  Unclear cause of anasarca, evaluating for CHF, cirrhosis, renal impairment.     Work-up suggestive of CHF exacerbation, admitted to hospital service for further care.  Elmer Sow. Pilar Plate, MD [facility] Emergency  Medicine [facility] [email or web address]  Final Clinical Impressions(s) / ED Diagnoses     ICD-10-CM   1. Anasarca  R60.1     ED Discharge Orders    None       Discharge Instructions Discussed with and Provided to Patient:   Discharge Instructions   None       Sabas Sous, MD 03/19/20 2337

## 2020-03-19 NOTE — ED Triage Notes (Addendum)
Pt reports that her abdomen has been swelling for 4 days. Pt has pitting edema in feet. Pt reports SOB due to swelling and pushing on lungs Pt is seen by Dr Diona Browner

## 2020-03-19 NOTE — H&P (Signed)
History and Physical    Suzanne Tran ZOX:[id number] DOB: May 05, 1956 DOA: 03/19/2020  PCP: Benita StabileHall, John Z, MD   Patient coming from: Home   Chief Complaint: Swelling, SOB   HPI: Suzanne Tran is a 64 y.o. female with medical history significant for insulin-dependent diabetes mellitus, chronic kidney disease stage IIIa, and chronic systolic CHF, now presenting to the emergency department for evaluation of swelling, weight gain, and shortness of breath. Patient reports difficulty with mobility due to chronic right foot ulcer and has not been taking Lasix as directed because it is difficult for her to get to the bathroom. She has noticed progressive weight gain, swelling in abdomen and legs, and SOB over the past week. She was unable to sleep last night due to orthopnea. She denies chest pain, fevers, chills, or cough.    ED Course: Upon arrival to the ED, patient is found to be afebrile, saturating well but tachypneic at rest, and hypertensive to 190/99.  EKG features a sinus rhythm and chest x-ray notable for cardiomegaly, small pleural effusions, vascular congestion, and mild interstitial edema.  CT the abdomen and pelvis is notable for mild hepatosplenomegaly, slight nodular contour of the liver, and subcutaneous edema.  Chemistry panel features a creatinine 1.25, up from 1.12 in March.  CBC with a normocytic anemia.  BNP elevated to 525.  COVID-19 screening test not yet collected.  Patient was treated with 40 mg IV Lasix in the ED.  Review of Systems:  All other systems reviewed and apart from HPI, are negative.  Past Medical History:  Diagnosis Date  . Charcot ankle, right   . CKD (chronic kidney disease) stage 3, GFR 30-59 ml/min   . Diabetic Charcot foot (HCC)   . Essential hypertension   . Mixed hyperlipidemia   . Nephrolithiasis   . Neuropathy   . OSA (obstructive sleep apnea)   . Type 2 diabetes mellitus (HCC)     Past Surgical History:  Procedure Laterality Date  . ANKLE  SURGERY    . KNEE SURGERY    . REPLACEMENT TOTAL KNEE    . TONSILLECTOMY      Social History:   reports that she has never smoked. She has never used smokeless tobacco. She reports that she does not drink alcohol and does not use drugs.  Allergies  Allergen Reactions  . Codeine     Rash and oral swelling   . Dilantin [Phenytoin] Other (See Comments)    seizure     Family History  Problem Relation Age of Onset  . Diabetes Mellitus II Mother   . Cancer Mother   . Hypertension Father   . CAD Father   . Diabetes Mellitus II Father   . Diabetes Sister   . Diabetes Brother      Prior to Admission medications   Medication Sig Start Date End Date Taking? Authorizing Provider  albuterol (VENTOLIN HFA) 108 (90 Base) MCG/ACT inhaler SMARTSIG:2 Puff(s) By Mouth Every 6 Hours PRN 11/04/19   [provider]  ascorbic acid (VITAMIN C) 250 MG tablet Take by mouth.    [provider]  aspirin EC 81 MG tablet Take 1 tablet (81 mg total) by mouth daily. Swallow whole. 01/07/20   Strader, Lennart PallBrittany M, PA-C  B Complex Vitamins (VITAMIN B-COMPLEX) TABS Take by mouth.    [provider]  furosemide (LASIX) 20 MG tablet Take 20 mg by mouth daily. 01/27/20   [provider]  insulin regular (NOVOLIN R) 100 units/mL injection Inject  into the skin 3 (three) times daily before meals. Sliding scale    [provider]  LANTUS SOLOSTAR 100 UNIT/ML Solostar Pen INJECT 10 30 UNITS SUBCUTANEOUSLY ONCE DAILY 09/22/19   [provider]  losartan-hydrochlorothiazide (HYZAAR) 50-12.5 MG tablet Take 1 tablet by mouth daily.  08/21/17   [provider]  potassium chloride (KLOR-CON) 10 MEQ tablet Take 10 mEq by mouth daily. 01/27/20   [provider]    Physical Exam: Vitals:   03/19/20 1757  BP: (!) 190/99  Pulse: 87  Resp: (!) 27  Temp: 98.6 F (37 C)  TempSrc: Oral  SpO2: 97%    Constitutional: NAD, calm  Eyes: PERTLA, lids and  conjunctivae normal ENMT: Mucous membranes are moist. Posterior pharynx clear of any exudate or lesions.   Neck: normal, supple, no masses, no thyromegaly Respiratory: clear to auscultation bilaterally, no wheezing, no crackles. No accessory muscle use.  Cardiovascular: S1 & S2 heard, regular rate and rhythm. Pitting edema involving bilateral LEs.   Abdomen: Distended, soft. Bowel sounds active.  Musculoskeletal: no clubbing / cyanosis. No joint deformity upper and lower extremities.   Skin: Serous weeping from bilateral LEs. Warm, dry, well-perfused. Neurologic: CN 2-12 grossly intact. Sensation intact. Moving all extremities.  Psychiatric: Alert and oriented to person, place, and situation. Calm and cooperative.    Labs and Imaging on Admission: I have personally reviewed following labs and imaging studies  CBC: Recent Labs  Lab 03/19/20 1835  WBC 5.6  HGB 10.1*  HCT 33.4*  MCV 88.6  PLT 244   Basic Metabolic Panel: Recent Labs  Lab 03/19/20 1835  NA 139  K 4.5  CL 107  CO2 23  GLUCOSE 150*  BUN 41*  CREATININE 1.25*  CALCIUM 9.0   GFR: CrCl cannot be calculated (Unknown ideal weight.). Liver Function Tests: Recent Labs  Lab 03/19/20 1835  AST 16  ALT 12  ALKPHOS 52  BILITOT 0.4  PROT 6.9  ALBUMIN 3.2*   Recent Labs  Lab 03/19/20 1835  LIPASE 19   No results for input(s): AMMONIA in the last 168 hours. Coagulation Profile: Recent Labs  Lab 03/19/20 1835  INR 1.2   Cardiac Enzymes: No results for input(s): CKTOTAL, CKMB, CKMBINDEX, TROPONINI in the last 168 hours. BNP (last 3 results) No results for input(s): PROBNP in the last 8760 hours. HbA1C: No results for input(s): HGBA1C in the last 72 hours. CBG: No results for input(s): GLUCAP in the last 168 hours. Lipid Profile: No results for input(s): CHOL, HDL, LDLCALC, TRIG, CHOLHDL, LDLDIRECT in the last 72 hours. Thyroid Function Tests: No results for input(s): TSH, T4TOTAL, FREET4, T3FREE,  THYROIDAB in the last 72 hours. Anemia Panel: No results for input(s): VITAMINB12, FOLATE, FERRITIN, TIBC, IRON, RETICCTPCT in the last 72 hours. Urine analysis: No results found for: COLORURINE, APPEARANCEUR, LABSPEC, PHURINE, GLUCOSEU, HGBUR, BILIRUBINUR, KETONESUR, PROTEINUR, UROBILINOGEN, NITRITE, LEUKOCYTESUR Sepsis Labs: @LABRCNTIP (procalcitonin:4,lacticidven:4) )No results found for this or any previous visit (from the past 240 hour(s)).   Radiological Exams on Admission: CT ABDOMEN PELVIS WO CONTRAST  Result Date: 03/19/2020 CLINICAL DATA:  Abdominal distension. Patient reports abdominal swelling for 4 days and pitting edema in his feet. Shortness of breath. EXAM: CT ABDOMEN AND PELVIS WITHOUT CONTRAST TECHNIQUE: Multidetector CT imaging of the abdomen and pelvis was performed following the standard protocol without IV contrast. COMPARISON:  None. FINDINGS: Lower chest: Small right greater than left pleural effusions and adjacent atelectasis. Mild cardiomegaly. Coronary artery calcifications. Mitral annulus calcifications. Hepatobiliary: Mildly  enlarged liver spanning 19.5 cm cranial caudal. There is slight nodular capsular contours. No evidence of focal hepatic lesion on noncontrast exam. Multiple gallstones within physiologically distended gallbladder. Common bile duct is not well-defined on the current exam, no evidence of biliary dilatation. Pancreas: Mild atrophy.  No ductal dilatation or inflammation. Spleen: Mildly enlarged spanning 12.6 x 12.9 x 6.7 cm (volume = 570 cm^3). No evidence of focal abnormality. There is a small splenule at the hilum. Adrenals/Urinary Tract: No left adrenal nodule. Right adrenal gland is grossly normal but difficult to accurately delineate. There is dilatation of the left renal pelvis without definite calyceal dilatation or ureteral dilatation, likely extrarenal pelvis configuration. Left kidney demonstrates multifocal areas of cortical scarring with lobulated  contours. There is left perinephric edema. There is an exophytic lesion measuring water density from the lower left kidney measuring 2.1 cm likely cyst. An adjacent 11 mm nodule in the lower left kidney is too small to accurately characterize. Right kidney is displaced inferiorly in the abdomen. There is moderate right perinephric edema. No right hydronephrosis. No visualized urolithiasis. Urinary bladder is unremarkable. Stomach/Bowel: Bowel evaluation is limited in the absence of enteric contrast, motion artifact, and soft tissue attenuation from habitus. Ingested material distends the stomach. Normal positioning of the duodenum and ligament of Treitz. There is no small bowel obstruction. Bowel loops in the pelvis are difficult to delineate from adjacent ascites, no obvious bowel inflammation. Appendix tentatively but not definitively visualized. No appendicitis. Small to moderate volume of colonic stool. No colonic inflammation. Vascular/Lymphatic: Aortic atherosclerosis. No aortic aneurysm. Prominent retroperitoneal nodes measuring up to 8 mm, likely reactive but nonspecific. Reproductive: Uterus partially obscured by adjacent ascites and soft tissue attenuation. No gross adnexal mass. Other: Small to moderate volume intra-abdominal ascites with fluid in the right left upper quadrant, left pericolic gutter, and tracking into the pelvis. There is mild to moderate mesenteric edema. Severe subcutaneous edema and skin thickening. Edema appears confluent in the flanks. No free intra-abdominal air. Musculoskeletal: Mild superior endplate compression fracture of L4 versus Schmorl's node, appears chronic. There is multilevel degenerative change in the spine. Cortical irregularity about the anterior right iliac bone may be sequela of remote injury or postsurgical. IMPRESSION: 1. Mild hepatosplenomegaly. Slight nodular capsular contours, can be seen with cirrhosis. Recommend correlation for cirrhosis risk factors. 2.  Significant third spacing with severe subcutaneous edema. Small to moderate abdominopelvic ascites. 3. Small right greater than left pleural effusions and adjacent atelectasis. 4. Cholelithiasis without evidence of gallbladder inflammation. 5. Left renal cortical scarring. Probable left renal cyst. Additional nodule in the lower left kidney is too small to accurately characterize. Aortic Atherosclerosis (ICD10-I70.0). Electronically Signed   By: Narda Rutherford M.D.   On: 03/19/2020 18:48   DG Chest Port 1 View  Result Date: 03/19/2020 CLINICAL DATA:  Shortness of breath.  Abdominal swelling for 4 days. EXAM: PORTABLE CHEST 1 VIEW COMPARISON:  Radiograph and chest CT 10/10/2019 FINDINGS: Similar cardiomegaly. Unchanged mediastinal contours with aortic atherosclerosis. Small bilateral pleural effusions. There is vascular congestion with mild interstitial opacities suspicious for edema. No confluent airspace disease. No pneumothorax. No acute osseous abnormalities are seen IMPRESSION: Cardiomegaly with small pleural effusions, vascular congestion and mild interstitial opacities suspicious for pulmonary edema. Overall findings consistent with CHF. Electronically Signed   By: Narda Rutherford M.D.   On: 03/19/2020 18:51    EKG: Independently reviewed. Sinus rhythm, LAD.   Assessment/Plan  1. Acute on chronic systolic CHF  - Patient with CHF presenting  with weight-gain, swelling, and SOB and is found to have elevated BNP, peripheral edema, dyspnea at rest, and edema on CXR  - She was given Lasix 40 mg IV in ED  - Continue diuresis with Lasix 40 mg IV q12h, monitor daily wt and I/Os, monitor renal function and electrolytes    2. Type II DM  - Continue CBGs and insulin   3. CKD IIIa  - SCr slightly above baseline on admission  - Renally-dose medications, monitor closely while diuresing   4. Hypertensive urgency  - BP 190/100 in ED  - Anticipate improvement with diuresis, follow-up pharmacy med-rec,  use hydralazine as needed   5. Right foot ulcer  - Improving per patient report, continue wound care    DVT prophylaxis: Lovenox Code Status: Full  Family Communication: Discussed with patient  Disposition Plan:  Patient is from: Home  Anticipated d/c is to: TBD Anticipated d/c date is: 03/22/20 Patient currently: Dyspneic at rest, pending improvement with diuresis  Consults called: none  Admission status: Inpatient     Briscoe Deutscher, MD [facility]  03/19/2020, 8:15 PM  \

## 2020-03-20 DIAGNOSIS — R601 Generalized edema: Secondary | ICD-10-CM

## 2020-03-20 LAB — BASIC METABOLIC PANEL
Anion gap: 10 (ref 5–15)
BUN: 43 mg/dL — ABNORMAL HIGH (ref 8–23)
CO2: 23 mmol/L (ref 22–32)
Calcium: 9.1 mg/dL (ref 8.9–10.3)
Chloride: 105 mmol/L (ref 98–111)
Creatinine, Ser: 1.3 mg/dL — ABNORMAL HIGH (ref 0.44–1.00)
GFR calc Af Amer: 50 mL/min — ABNORMAL LOW (ref >60)
GFR calc non Af Amer: 43 mL/min — ABNORMAL LOW (ref >60)
Glucose, Bld: 168 mg/dL — ABNORMAL HIGH (ref 70–99)
Potassium: 4.5 mmol/L (ref 3.5–5.1)
Sodium: 138 mmol/L (ref 135–145)

## 2020-03-20 LAB — CBC
HCT: 33 % — ABNORMAL LOW (ref 36.0–46.0)
Hemoglobin: 10 g/dL — ABNORMAL LOW (ref 12.0–15.0)
MCH: 26.8 pg (ref 26.0–34.0)
MCHC: 30.3 g/dL (ref 30.0–36.0)
MCV: 88.5 fL (ref 80.0–100.0)
Platelets: 249 10*3/uL (ref 150–400)
RBC: 3.73 MIL/uL — ABNORMAL LOW (ref 3.87–5.11)
RDW: 15.5 % (ref 11.5–15.5)
WBC: 5.6 10*3/uL (ref 4.0–10.5)
nRBC: 0 % (ref 0.0–0.2)

## 2020-03-20 LAB — GLUCOSE, CAPILLARY: Glucose-Capillary: 203 mg/dL — ABNORMAL HIGH (ref 70–99)

## 2020-03-20 LAB — CBG MONITORING, ED
Glucose-Capillary: 159 mg/dL — ABNORMAL HIGH (ref 70–99)
Glucose-Capillary: 184 mg/dL — ABNORMAL HIGH (ref 70–99)
Glucose-Capillary: 190 mg/dL — ABNORMAL HIGH (ref 70–99)

## 2020-03-20 LAB — HEPATITIS PANEL, ACUTE
HCV Ab: NONREACTIVE
Hep A IgM: NONREACTIVE
Hep B C IgM: NONREACTIVE
Hepatitis B Surface Ag: NONREACTIVE

## 2020-03-20 LAB — HIV ANTIBODY (ROUTINE TESTING W REFLEX): HIV Screen 4th Generation wRfx: NONREACTIVE

## 2020-03-20 LAB — HEMOGLOBIN A1C
Hgb A1c MFr Bld: 6 % — ABNORMAL HIGH (ref 4.8–5.6)
Mean Plasma Glucose: 125.5 mg/dL

## 2020-03-20 MED ORDER — INSULIN ASPART 100 UNIT/ML ~~LOC~~ SOLN
0.0000 [IU] | Freq: Three times a day (TID) | SUBCUTANEOUS | Status: DC
  Administered 2020-03-20 – 2020-03-23 (×9): 2 [IU] via SUBCUTANEOUS
  Administered 2020-03-23: 3 [IU] via SUBCUTANEOUS
  Administered 2020-03-24: 2 [IU] via SUBCUTANEOUS
  Administered 2020-03-24: 3 [IU] via SUBCUTANEOUS
  Administered 2020-03-24: 2 [IU] via SUBCUTANEOUS
  Administered 2020-03-25 (×2): 3 [IU] via SUBCUTANEOUS
  Administered 2020-03-25: 2 [IU] via SUBCUTANEOUS
  Administered 2020-03-26 (×2): 3 [IU] via SUBCUTANEOUS
  Administered 2020-03-26 – 2020-03-27 (×2): 2 [IU] via SUBCUTANEOUS
  Administered 2020-03-27 – 2020-03-28 (×4): 3 [IU] via SUBCUTANEOUS
  Administered 2020-03-28: 5 [IU] via SUBCUTANEOUS
  Administered 2020-03-29 – 2020-03-30 (×4): 2 [IU] via SUBCUTANEOUS
  Administered 2020-03-30: 3 [IU] via SUBCUTANEOUS
  Administered 2020-03-30 – 2020-03-31 (×3): 2 [IU] via SUBCUTANEOUS
  Filled 2020-03-20: qty 1

## 2020-03-20 MED ORDER — INSULIN ASPART 100 UNIT/ML ~~LOC~~ SOLN
0.0000 [IU] | Freq: Every day | SUBCUTANEOUS | Status: DC
  Administered 2020-03-20 – 2020-03-21 (×2): 2 [IU] via SUBCUTANEOUS
  Administered 2020-03-22 – 2020-03-23 (×2): 3 [IU] via SUBCUTANEOUS
  Administered 2020-03-24 – 2020-03-26 (×3): 2 [IU] via SUBCUTANEOUS
  Administered 2020-03-27: 3 [IU] via SUBCUTANEOUS
  Administered 2020-03-28 – 2020-03-30 (×3): 2 [IU] via SUBCUTANEOUS

## 2020-03-20 MED ORDER — FUROSEMIDE 10 MG/ML IJ SOLN
40.0000 mg | Freq: Two times a day (BID) | INTRAMUSCULAR | Status: DC
  Administered 2020-03-20 – 2020-03-26 (×13): 40 mg via INTRAVENOUS
  Filled 2020-03-20 (×13): qty 4

## 2020-03-20 MED ORDER — SODIUM CHLORIDE 0.9 % IV SOLN: 250.0000 mL | INTRAVENOUS | Status: DC | PRN

## 2020-03-20 MED ORDER — CARVEDILOL 3.125 MG PO TABS
3.1250 mg | ORAL_TABLET | Freq: Two times a day (BID) | ORAL | Status: DC
  Administered 2020-03-20 – 2020-03-31 (×23): 3.125 mg via ORAL
  Filled 2020-03-20 (×23): qty 1

## 2020-03-20 MED ORDER — ACETAMINOPHEN 325 MG PO TABS
650.0000 mg | ORAL_TABLET | ORAL | Status: DC | PRN
  Administered 2020-03-26 – 2020-03-27 (×2): 650 mg via ORAL
  Filled 2020-03-20 (×2): qty 2

## 2020-03-20 MED ORDER — ENOXAPARIN SODIUM 40 MG/0.4ML ~~LOC~~ SOLN
40.0000 mg | SUBCUTANEOUS | Status: DC
  Administered 2020-03-20 – 2020-03-29 (×10): 40 mg via SUBCUTANEOUS
  Filled 2020-03-20 (×9): qty 0.4

## 2020-03-20 MED ORDER — ONDANSETRON HCL 4 MG/2ML IJ SOLN: 4.0000 mg | Freq: Four times a day (QID) | INTRAMUSCULAR | Status: DC | PRN

## 2020-03-20 MED ORDER — SODIUM CHLORIDE 0.9% FLUSH: 3.0000 mL | INTRAVENOUS | Status: DC | PRN

## 2020-03-20 MED ORDER — SODIUM CHLORIDE 0.9% FLUSH
3.0000 mL | Freq: Two times a day (BID) | INTRAVENOUS | Status: DC
  Administered 2020-03-20 – 2020-03-31 (×23): 3 mL via INTRAVENOUS

## 2020-03-20 NOTE — Progress Notes (Signed)
PROGRESS NOTE  Suzanne Tran VPX:[id number] DOB: May 04, 1956 DOA: 03/19/2020 PCP: Benita Stabile, MD   LOS: 1 day   Brief Narrative / Interim history: 64 year old female with IDDM, CKD stage IIIa, chronic right lower extremity wounds, chronic systolic CHF came into the hospital with complaints of swelling, weight gain, shortness of breath.  She reports difficulties with ambulating due to her right foot ulcer as she has a cast, and also difficulties due to her breathing.  She reports increasing abdominal swelling.  She occasionally skips her Lasix when she has an appointment and has to drive to [facility].  Chest x-ray on admission showed interstitial edema she was also found to be hypoxic requiring supplemental oxygen  Subjective / 24h Interval events: She is feeling about the same this morning, is very emotional and tearful in the ED as she lives by herself and really cannot take care of herself anymore, she cannot ambulate much and she has difficulties with ADLs  Assessment & Plan: Principal Problem Acute hypoxic respiratory failure due to acute pulmonary edema due to acute on chronic systolic CHF -patient is grossly volume overloaded, she has been started on IV Lasix twice daily, continue to closely monitor ins and outs, daily weights -2D echo done 4 months ago showed EF 45-50%, dilated left atrium -She also underwent a Lexiscan 3 months ago which showed LVEF 32%  Active Problems Chronic kidney disease stage IIIa -baseline creatinine 1.0-1.1, currently within acceptable range.  Closely monitor while receiving Lasix  Essential hypertension-hold losartan HCTZ to allow for diuresis.  She is still hypertensive this morning, will start low-dose carvedilol, will increase dose if she tolerates  Obesity-definitely there is a component of fluid overload but she would benefit also from weight loss  Hepatosplenomegaly, concern for liver disease -CT scan showed slight nodular capsular contours of the  liver which can be seen with cirrhosis.  She has never drank alcohol.  Suspect this is [name], patient would benefit from weight loss  Cholelithiasis-no evidence of gallbladder inflammation on the CT scan, she is asymptomatic from the standpoint.  This can be addressed as an outpatient.  Normocytic anemia, in the setting of chronic disease processes -monitor hemoglobin, stable, no bleeding  Chronic right lower extremity wound -follows with wound care in [place], she has a hard cast which apparently is being removed every 7 days or so.  She is not on antibiotics, afebrile and no WBC  Deconditioning -in the setting of severe fluid overload, poor mobility, will obtain physical therapy consult.  She probably needs SNF at this point  Insulin-dependent diabetes mellitus -Continue sliding scale, closely monitor CBGs.  Check A1c  CBG (last 3)  Recent Labs    03/19/20 2347 03/20/20 0613  GLUCAP 119* 159*    Scheduled Meds: . enoxaparin (LOVENOX) injection  40 mg Subcutaneous Q24H  . furosemide  40 mg Intravenous Q12H  . insulin aspart  0-5 Units Subcutaneous QHS  . insulin aspart  0-9 Units Subcutaneous TID WC  . sodium chloride flush  3 mL Intravenous Q12H   Continuous Infusions: . sodium chloride     PRN Meds:.sodium chloride, acetaminophen, hydrALAZINE, ondansetron (ZOFRAN) IV, sodium chloride flush  Diet Orders (From admission, onward)    Start     Ordered   03/20/20 0027  Diet heart healthy/carb modified Room service appropriate? Yes; Fluid consistency: Thin; Fluid restriction: 1500 mL Fluid  Diet effective now       Question Answer Comment  Diet-HS Snack? Nothing   Room service appropriate?  Yes   Fluid consistency: Thin   Fluid restriction: 1500 mL Fluid      03/20/20 0026          DVT prophylaxis: enoxaparin (LOVENOX) injection 40 mg Start: 03/20/20 1000     Code Status: Full Code  Family Communication: no family at bedside   Status is: Inpatient  Remains  inpatient appropriate because:IV treatments appropriate due to intensity of illness or inability to take PO and Inpatient level of care appropriate due to severity of illness   Dispo: The patient is from: Home              Anticipated d/c is to: SNF              Anticipated d/c date is: > 3 days              Patient currently is not medically stable to d/c.  Consultants:  None   Procedures:  None   Microbiology  None   Antimicrobials: None     Objective: Vitals:   03/19/20 1930 03/19/20 2000 03/19/20 2030 03/19/20 2100  BP: (!) 174/96 (!) 173/98 (!) 175/94 (!) 182/91  Pulse: 88 87 86 90  Resp: (!) 22 20 (!) 28 (!) 30  Temp:      TempSrc:      SpO2: 100% 95% 98% 94%    Intake/Output Summary (Last 24 hours) at 03/20/2020 0814 Last data filed at 03/20/2020 0938 Gross per 24 hour  Intake --  Output 1500 ml  Net -1500 ml   There were no vitals filed for this visit.  Examination:  Constitutional: NAD Eyes: no scleral icterus ENMT: Mucous membranes are moist.  Neck: normal, supple Respiratory: Difficult exam due to obesity but overall moves air well, no wheezing, crackles at the bases present.  Tachypneic Cardiovascular: Regular rate and rhythm, no murmurs / rubs / gallops.  2+ pitting LE edema. Good peripheral pulses Abdomen: non distended, no tenderness. Bowel sounds positive.  Musculoskeletal: no clubbing / cyanosis.  Skin: Chronic venous stasis changes bilateral lower extremities, right foot is in a cast Neurologic: CN 2-12 grossly intact. Strength 5/5 in all 4.    Data Reviewed: I have independently reviewed following labs and imaging studies   CBC: Recent Labs  Lab 03/19/20 1835 03/20/20 0632  WBC 5.6 5.6  HGB 10.1* 10.0*  HCT 33.4* 33.0*  MCV 88.6 88.5  PLT 244 249   Basic Metabolic Panel: Recent Labs  Lab 03/19/20 1835 03/20/20 0632  NA 139 138  K 4.5 4.5  CL 107 105  CO2 23 23  GLUCOSE 150* 168*  BUN 41* 43*  CREATININE 1.25* 1.30*   CALCIUM 9.0 9.1   Liver Function Tests: Recent Labs  Lab 03/19/20 1835  AST 16  ALT 12  ALKPHOS 52  BILITOT 0.4  PROT 6.9  ALBUMIN 3.2*   Coagulation Profile: Recent Labs  Lab 03/19/20 1835  INR 1.2   HbA1C: No results for input(s): HGBA1C in the last 72 hours. CBG: Recent Labs  Lab 03/19/20 2347 03/20/20 0613  GLUCAP 119* 159*    Recent Results (from the past 240 hour(s))  SARS Coronavirus 2 by RT PCR (hospital order, performed in [facility] hospital lab) Nasopharyngeal Nasopharyngeal Swab     Status: None   Collection Time: 03/19/20  9:37 PM   Specimen: Nasopharyngeal Swab  Result Value Ref Range Status   SARS Coronavirus 2 NEGATIVE NEGATIVE Final    Comment: (NOTE) SARS-CoV-2 target nucleic acids are  NOT DETECTED.  The SARS-CoV-2 RNA is generally detectable in upper and lower respiratory specimens during the acute phase of infection. The lowest concentration of SARS-CoV-2 viral copies this assay can detect is 250 copies / mL. A negative result does not preclude SARS-CoV-2 infection and should not be used as the sole basis for treatment or other patient management decisions.  A negative result may occur with improper specimen collection / handling, submission of specimen other than nasopharyngeal swab, presence of viral mutation(s) within the areas targeted by this assay, and inadequate number of viral copies (<250 copies / mL). A negative result must be combined with clinical observations, patient history, and epidemiological information.  Fact Sheet for Patients:   BoilerBrush.com.cyhttps://www.fda.gov/media/136312/download  Fact Sheet for Healthcare Providers: https://pope.com/https://www.fda.gov/media/136313/download  This test is not yet approved or  cleared by the [place] FDA and has been authorized for detection and/or diagnosis of SARS-CoV-2 by FDA under an Emergency Use Authorization (EUA).  This EUA will remain in effect (meaning this test can be used) for the duration of  the COVID-19 declaration under Section 564(b)(1) of the Act, 21 U.S.C. section 360bbb-3(b)(1), unless the authorization is terminated or revoked sooner.  Performed at [facility], [address] [place], [place] [address]      Radiology Studies: CT ABDOMEN PELVIS WO CONTRAST  Result Date: 03/19/2020 CLINICAL DATA:  Abdominal distension. Patient reports abdominal swelling for 4 days and pitting edema in his feet. Shortness of breath. EXAM: CT ABDOMEN AND PELVIS WITHOUT CONTRAST TECHNIQUE: Multidetector CT imaging of the abdomen and pelvis was performed following the standard protocol without IV contrast. COMPARISON:  None. FINDINGS: Lower chest: Small right greater than left pleural effusions and adjacent atelectasis. Mild cardiomegaly. Coronary artery calcifications. Mitral annulus calcifications. Hepatobiliary: Mildly enlarged liver spanning 19.5 cm cranial caudal. There is slight nodular capsular contours. No evidence of focal hepatic lesion on noncontrast exam. Multiple gallstones within physiologically distended gallbladder. Common bile duct is not well-defined on the current exam, no evidence of biliary dilatation. Pancreas: Mild atrophy.  No ductal dilatation or inflammation. Spleen: Mildly enlarged spanning 12.6 x 12.9 x 6.7 cm (volume = 570 cm^3). No evidence of focal abnormality. There is a small splenule at the hilum. Adrenals/Urinary Tract: No left adrenal nodule. Right adrenal gland is grossly normal but difficult to accurately delineate. There is dilatation of the left renal pelvis without definite calyceal dilatation or ureteral dilatation, likely extrarenal pelvis configuration. Left kidney demonstrates multifocal areas of cortical scarring with lobulated contours. There is left perinephric edema. There is an exophytic lesion measuring water density from the lower left kidney measuring 2.1 cm likely cyst. An adjacent 11 mm nodule in the lower left kidney is too small to accurately  characterize. Right kidney is displaced inferiorly in the abdomen. There is moderate right perinephric edema. No right hydronephrosis. No visualized urolithiasis. Urinary bladder is unremarkable. Stomach/Bowel: Bowel evaluation is limited in the absence of enteric contrast, motion artifact, and soft tissue attenuation from habitus. Ingested material distends the stomach. Normal positioning of the duodenum and ligament of Treitz. There is no small bowel obstruction. Bowel loops in the pelvis are difficult to delineate from adjacent ascites, no obvious bowel inflammation. Appendix tentatively but not definitively visualized. No appendicitis. Small to moderate volume of colonic stool. No colonic inflammation. Vascular/Lymphatic: Aortic atherosclerosis. No aortic aneurysm. Prominent retroperitoneal nodes measuring up to 8 mm, likely reactive but nonspecific. Reproductive: Uterus partially obscured by adjacent ascites and soft tissue attenuation. No gross adnexal mass. Other: Small  to moderate volume intra-abdominal ascites with fluid in the right left upper quadrant, left pericolic gutter, and tracking into the pelvis. There is mild to moderate mesenteric edema. Severe subcutaneous edema and skin thickening. Edema appears confluent in the flanks. No free intra-abdominal air. Musculoskeletal: Mild superior endplate compression fracture of L4 versus Schmorl's node, appears chronic. There is multilevel degenerative change in the spine. Cortical irregularity about the anterior right iliac bone may be sequela of remote injury or postsurgical. IMPRESSION: 1. Mild hepatosplenomegaly. Slight nodular capsular contours, can be seen with cirrhosis. Recommend correlation for cirrhosis risk factors. 2. Significant third spacing with severe subcutaneous edema. Small to moderate abdominopelvic ascites. 3. Small right greater than left pleural effusions and adjacent atelectasis. 4. Cholelithiasis without evidence of gallbladder  inflammation. 5. Left renal cortical scarring. Probable left renal cyst. Additional nodule in the lower left kidney is too small to accurately characterize. Aortic Atherosclerosis (ICD10-I70.0). Electronically Signed   By: Narda Rutherford M.D.   On: 03/19/2020 18:48   DG Chest Port 1 View  Result Date: 03/19/2020 CLINICAL DATA:  Shortness of breath.  Abdominal swelling for 4 days. EXAM: PORTABLE CHEST 1 VIEW COMPARISON:  Radiograph and chest CT 10/10/2019 FINDINGS: Similar cardiomegaly. Unchanged mediastinal contours with aortic atherosclerosis. Small bilateral pleural effusions. There is vascular congestion with mild interstitial opacities suspicious for edema. No confluent airspace disease. No pneumothorax. No acute osseous abnormalities are seen IMPRESSION: Cardiomegaly with small pleural effusions, vascular congestion and mild interstitial opacities suspicious for pulmonary edema. Overall findings consistent with CHF. Electronically Signed   By: Narda Rutherford M.D.   On: 03/19/2020 18:51    Pamella Pert, MD, PhD [facility]  Between 7 am - 7 pm I am available, please contact me via Amion or Securechat  Between 7 pm - 7 am I am not available, please contact night coverage MD/APP via Amion

## 2020-03-20 NOTE — ED Notes (Signed)
Changed out purewick canister 

## 2020-03-20 NOTE — TOC Initial Note (Signed)
Transition of Care [facility]) - Initial/Assessment Note    Patient Details  Name: Suzanne Tran MRN: [id number] Date of Birth: September 05, 1955  [facility] CM/SW Contact:    Barry Brunner, LCSW Phone Number: 03/20/2020, 6:26 PM  Clinical Narrative:                 Patient is a 64 year old female admitted for CHF. CSW provided CHF consult. Patient reported given new CHF consult, she has never monitored her fluid intake and was not aware of what a heart healthy diet consisted of. CSW discussed with patient how to monitor fluid retention, what a heart healthy diet consisted of, and appropriate fluid intake. Patient reported that she would be agreeable to local SNF placement because of the weakness she experienced in her arms and while ambulating. Patient reported that she did not feel safe returning home because of her weakness. Patient reported that she has not had the Covid vaccine  And would be unwilling to receive it. Patient reported that she understood that she would be required to be in isolation for 14 days without the Covid 19 vaccine. TOC to follow  Expected Discharge Plan: Skilled Nursing Facility Barriers to Discharge: Continued Medical Work up   Patient Goals and CMS Choice Patient states their goals for this hospitalization and ongoing recovery are:: Gain strength with SNF CMS Medicare.gov Compare Post Acute Care list provided to:: Patient Choice offered to / list presented to : Patient  Expected Discharge Plan and Services Expected Discharge Plan: Skilled Nursing Facility     Post Acute Care Choice: Skilled Nursing Facility Living arrangements for the past 2 months: Apartment                                      Prior Living Arrangements/Services Living arrangements for the past 2 months: Apartment Lives with:: Self Patient language and need for interpreter reviewed:: No Do you feel safe going back to the place where you live?: No   Patient doesn't feel  strong enough to go home safely  Need for Family Participation in Patient Care: Yes (Comment) Care giver support system in place?: Yes (comment)   Criminal Activity/Legal Involvement Pertinent to Current Situation/Hospitalization: No - Comment as needed  Activities of Daily Living Home Assistive Devices/Equipment: Walker (specify type), Cane (specify quad or straight), CBG Meter ADL Screening (condition at time of admission) Patient's cognitive ability adequate to safely complete daily activities?: Yes Is the patient deaf or have difficulty hearing?: No Does the patient have difficulty seeing, even when wearing glasses/contacts?: No Does the patient have difficulty concentrating, remembering, or making decisions?: No Patient able to express need for assistance with ADLs?: Yes Does the patient have difficulty dressing or bathing?: No Independently performs ADLs?: Yes (appropriate for developmental age) Does the patient have difficulty walking or climbing stairs?: No Weakness of Legs: None Weakness of Arms/Hands: None  Permission Sought/Granted Permission sought to share information with : Family Supports Permission granted to share information with : Yes, Verbal Permission Granted  Share Information with NAME: Lajuan Lines  Permission granted to share info w AGENCY: Local SNF  Permission granted to share info w Relationship: Brother  Permission granted to share info w Contact Information: [phone]  Emotional Assessment     Affect (typically observed): Accepting, Apprehensive, Anxious (Accepting of Diagnosis, Concerned about follow up treatment) Orientation: : Oriented to Self, Oriented to Situation,  Oriented to  Time, Oriented to Place Alcohol / Substance Use: Not Applicable Psych Involvement: No (comment)  Admission diagnosis:  Anasarca [R60.1] Acute on chronic systolic (congestive) heart failure (HCC) [I50.23] Patient Active Problem List   Diagnosis Date Noted  . Acute  on chronic systolic (congestive) heart failure (HCC) 03/19/2020  . CKD (chronic kidney disease) stage 3, GFR 30-59 ml/min   . Type 2 diabetes mellitus (HCC)   . Hypertensive urgency   . Ulcer of right foot (HCC)    PCP:  Benita Stabile, MD Pharmacy:   [facility] [address], [place] [address] [place] [address] [place] [address] [place] [address] Phone: [phone] Fax: [phone]     Social Determinants of Health (SDOH) Interventions    Readmission Risk Interventions Readmission Risk Prevention Plan 03/20/2020  Post Dischage Appt Complete  Medication Screening Complete  Transportation Screening Complete

## 2020-03-21 LAB — BASIC METABOLIC PANEL
Anion gap: 10 (ref 5–15)
BUN: 43 mg/dL — ABNORMAL HIGH (ref 8–23)
CO2: 25 mmol/L (ref 22–32)
Calcium: 9.2 mg/dL (ref 8.9–10.3)
Chloride: 104 mmol/L (ref 98–111)
Creatinine, Ser: 1.34 mg/dL — ABNORMAL HIGH (ref 0.44–1.00)
GFR calc Af Amer: 48 mL/min — ABNORMAL LOW (ref >60)
GFR calc non Af Amer: 41 mL/min — ABNORMAL LOW (ref >60)
Glucose, Bld: 185 mg/dL — ABNORMAL HIGH (ref 70–99)
Potassium: 4.3 mmol/L (ref 3.5–5.1)
Sodium: 139 mmol/L (ref 135–145)

## 2020-03-21 LAB — GLUCOSE, CAPILLARY
Glucose-Capillary: 180 mg/dL — ABNORMAL HIGH (ref 70–99)
Glucose-Capillary: 194 mg/dL — ABNORMAL HIGH (ref 70–99)
Glucose-Capillary: 197 mg/dL — ABNORMAL HIGH (ref 70–99)
Glucose-Capillary: 232 mg/dL — ABNORMAL HIGH (ref 70–99)

## 2020-03-21 LAB — CBC
HCT: 32.3 % — ABNORMAL LOW (ref 36.0–46.0)
Hemoglobin: 9.6 g/dL — ABNORMAL LOW (ref 12.0–15.0)
MCH: 26.7 pg (ref 26.0–34.0)
MCHC: 29.7 g/dL — ABNORMAL LOW (ref 30.0–36.0)
MCV: 89.7 fL (ref 80.0–100.0)
Platelets: 240 10*3/uL (ref 150–400)
RBC: 3.6 MIL/uL — ABNORMAL LOW (ref 3.87–5.11)
RDW: 15.3 % (ref 11.5–15.5)
WBC: 5.3 10*3/uL (ref 4.0–10.5)
nRBC: 0 % (ref 0.0–0.2)

## 2020-03-21 LAB — HEMOGLOBIN A1C
Hgb A1c MFr Bld: 6.3 % — ABNORMAL HIGH (ref 4.8–5.6)
Mean Plasma Glucose: 134.11 mg/dL

## 2020-03-21 NOTE — Progress Notes (Signed)
PROGRESS NOTE  Suzanne Tran IEP:[id number] DOB: April 10, 1956 DOA: 03/19/2020 PCP: Benita Stabile, MD   LOS: 2 days   Brief Narrative / Interim history: 64 year old female with IDDM, CKD stage IIIa, chronic right lower extremity wounds, chronic systolic CHF came into the hospital with complaints of swelling, weight gain, shortness of breath.  She reports difficulties with ambulating due to her right foot ulcer as she has a cast, and also difficulties due to her breathing.  She reports increasing abdominal swelling.  She occasionally skips her Lasix when she has an appointment and has to drive to [facility].  Chest x-ray on admission showed interstitial edema she was also found to be hypoxic requiring supplemental oxygen  Subjective / 24h Interval events: No chest pain this morning, still complains of shortness of breath.  Overall more comfortable  Assessment & Plan: Principal Problem Acute hypoxic respiratory failure due to acute pulmonary edema due to acute on chronic systolic CHF -patient is grossly volume overloaded, she has been started on IV Lasix twice daily, continue to closely monitor ins and outs, daily weights -2D echo done 4 months ago showed EF 45-50%, dilated left atrium, will not repeat -She also underwent a Lexiscan 3 months ago which showed LVEF 32% -Seems to be responding were to IV Lasix, continue today  Active Problems Chronic kidney disease stage IIIa -baseline creatinine 1.0-1.1, currently within acceptable range.  This morning labs are pending  Essential hypertension-hold losartan HCTZ to allow for diuresis.  Blood pressure much better on Coreg, continue  Obesity-definitely there is a component of fluid overload but she would benefit also from weight loss  Hepatosplenomegaly, concern for liver disease -CT scan showed slight nodular capsular contours of the liver which can be seen with cirrhosis.  She has never drank alcohol.  Suspect this is [name], patient would benefit  from weight loss  Cholelithiasis-no evidence of gallbladder inflammation on the CT scan, she is asymptomatic from the standpoint.  This can be addressed as an outpatient.  Remains asymptomatic this morning  Normocytic anemia, in the setting of chronic disease processes -monitor hemoglobin, stable, no bleeding  Chronic right lower extremity wound -follows with wound care in [place], she has a hard cast which apparently is being removed every 7 days or so.  Remains afebrile  Deconditioning -in the setting of severe fluid overload, poor mobility, will obtain physical therapy consult.  She probably needs SNF at this point  Insulin-dependent diabetes mellitus -Continue sliding scale, closely monitor CBGs.  A1c pending this morning  CBG (last 3)  Recent Labs    03/20/20 1010 03/20/20 1302 03/20/20 2036  GLUCAP 184* 190* 203*    Scheduled Meds:  carvedilol  3.125 mg Oral BID WC   enoxaparin (LOVENOX) injection  40 mg Subcutaneous Q24H   furosemide  40 mg Intravenous Q12H   insulin aspart  0-5 Units Subcutaneous QHS   insulin aspart  0-9 Units Subcutaneous TID WC   sodium chloride flush  3 mL Intravenous Q12H   Continuous Infusions:  sodium chloride     PRN Meds:.sodium chloride, acetaminophen, hydrALAZINE, ondansetron (ZOFRAN) IV, sodium chloride flush  Diet Orders (From admission, onward)    Start     Ordered   03/20/20 0027  Diet heart healthy/carb modified Room service appropriate? Yes; Fluid consistency: Thin; Fluid restriction: 1500 mL Fluid  Diet effective now       Question Answer Comment  Diet-HS Snack? Nothing   Room service appropriate? Yes   Fluid consistency: Thin  Fluid restriction: 1500 mL Fluid      03/20/20 0026          DVT prophylaxis: enoxaparin (LOVENOX) injection 40 mg Start: 03/20/20 1000     Code Status: Full Code  Family Communication: no family at bedside   Status is: Inpatient  Remains inpatient appropriate because:IV  treatments appropriate due to intensity of illness or inability to take PO and Inpatient level of care appropriate due to severity of illness   Dispo: The patient is from: Home              Anticipated d/c is to: SNF              Anticipated d/c date is: > 3 days              Patient currently is not medically stable to d/c.  Consultants:  None   Procedures:  None   Microbiology  None   Antimicrobials: None     Objective: Vitals:   03/20/20 2036 03/21/20 0035 03/21/20 0429 03/21/20 0550  BP: (!) 159/78 (!) 156/78 (!) 145/73 (!) 145/68  Pulse: 78 77 81 78  Resp: 18 20 18 20   Temp: 97.9 F (36.6 C) 98.5 F (36.9 C) 98.9 F (37.2 C) 98 F (36.7 C)  TempSrc:  Oral Oral   SpO2: 94% 94% 93% 95%  Weight:    (!) 151 kg    Intake/Output Summary (Last 24 hours) at 03/21/2020 0710 Last data filed at 03/21/2020 0500 Gross per 24 hour  Intake 120 ml  Output 650 ml  Net -530 ml   Filed Weights   03/21/20 0550  Weight: (!) 151 kg    Examination:  Constitutional: No distress Eyes: No icterus ENMT: Moist mucous membranes Neck: normal, supple Respiratory: Difficult exam due to obesity, no wheezing, faint bibasilar crackles Cardiovascular: Regular rate and rhythm, no murmurs.  2+ pitting edema Abdomen: Soft, nontender, nondistended, bowel sounds positive Musculoskeletal: no clubbing / cyanosis.  Skin: Chronic venous stasis changes bilateral lower extremities, right foot is in a cast Neurologic: Nonfocal, equal strength   Data Reviewed: I have independently reviewed following labs and imaging studies   CBC: Recent Labs  Lab 03/19/20 1835 03/20/20 0632 03/21/20 0616  WBC 5.6 5.6 5.3  HGB 10.1* 10.0* 9.6*  HCT 33.4* 33.0* 32.3*  MCV 88.6 88.5 89.7  PLT 244 249 240   Basic Metabolic Panel: Recent Labs  Lab 03/19/20 1835 03/20/20 0632  NA 139 138  K 4.5 4.5  CL 107 105  CO2 23 23  GLUCOSE 150* 168*  BUN 41* 43*  CREATININE 1.25* 1.30*  CALCIUM 9.0 9.1    Liver Function Tests: Recent Labs  Lab 03/19/20 1835  AST 16  ALT 12  ALKPHOS 52  BILITOT 0.4  PROT 6.9  ALBUMIN 3.2*   Coagulation Profile: Recent Labs  Lab 03/19/20 1835  INR 1.2   HbA1C: Recent Labs    03/20/20 0632  HGBA1C 6.0*   CBG: Recent Labs  Lab 03/19/20 2347 03/20/20 0613 03/20/20 1010 03/20/20 1302 03/20/20 2036  GLUCAP 119* 159* 184* 190* 203*    Recent Results (from the past 240 hour(s))  SARS Coronavirus 2 by RT PCR (hospital order, performed in [facility] hospital lab) Nasopharyngeal Nasopharyngeal Swab     Status: None   Collection Time: 03/19/20  9:37 PM   Specimen: Nasopharyngeal Swab  Result Value Ref Range Status   SARS Coronavirus 2 NEGATIVE NEGATIVE Final    Comment: (NOTE)  SARS-CoV-2 target nucleic acids are NOT DETECTED.  The SARS-CoV-2 RNA is generally detectable in upper and lower respiratory specimens during the acute phase of infection. The lowest concentration of SARS-CoV-2 viral copies this assay can detect is 250 copies / mL. A negative result does not preclude SARS-CoV-2 infection and should not be used as the sole basis for treatment or other patient management decisions.  A negative result may occur with improper specimen collection / handling, submission of specimen other than nasopharyngeal swab, presence of viral mutation(s) within the areas targeted by this assay, and inadequate number of viral copies (<250 copies / mL). A negative result must be combined with clinical observations, patient history, and epidemiological information.  Fact Sheet for Patients:   BoilerBrush.com.cy  Fact Sheet for Healthcare Providers: https://pope.com/  This test is not yet approved or  cleared by the [place] FDA and has been authorized for detection and/or diagnosis of SARS-CoV-2 by FDA under an Emergency Use Authorization (EUA).  This EUA will remain in effect (meaning this  test can be used) for the duration of the COVID-19 declaration under Section 564(b)(1) of the Act, 21 U.S.C. section 360bbb-3(b)(1), unless the authorization is terminated or revoked sooner.  Performed at [facility], [address] [place], [place] [address]      Radiology Studies: No results found.  Pamella Pert, MD, PhD [facility]  Between 7 am - 7 pm I am available, please contact me via Amion or Securechat  Between 7 pm - 7 am I am not available, please contact night coverage MD/APP via Amion

## 2020-03-21 NOTE — Progress Notes (Signed)
PT Cancellation Note  Patient Details Name: Suzanne Tran MRN: [id number] DOB: [birth year]   Cancelled Treatment:    Reason Eval/Treat Not Completed: Patient declined, no reason specified;Other (comment) (states everytime she gets up she soils herself and does not want to walk at this time.)  Patient encouraged to participate in PT but was unwilling this morning secondary to frequent accidents with movement and at rest. Will attempt again in afternoon.   Aletha Halim 03/21/2020, 12:57 PM

## 2020-03-21 NOTE — Care Management Important Message (Signed)
Important Message  Patient Details  Name: Suzanne Tran MRN: [id number] Date of Birth: 1955-10-15   Medicare Important Message Given:  Yes     Corey Harold 03/21/2020, 2:26 PM

## 2020-03-22 LAB — CBC
HCT: 31.8 % — ABNORMAL LOW (ref 36.0–46.0)
Hemoglobin: 9.6 g/dL — ABNORMAL LOW (ref 12.0–15.0)
MCH: 27.2 pg (ref 26.0–34.0)
MCHC: 30.2 g/dL (ref 30.0–36.0)
MCV: 90.1 fL (ref 80.0–100.0)
Platelets: 215 10*3/uL (ref 150–400)
RBC: 3.53 MIL/uL — ABNORMAL LOW (ref 3.87–5.11)
RDW: 15.2 % (ref 11.5–15.5)
WBC: 4.8 10*3/uL (ref 4.0–10.5)
nRBC: 0 % (ref 0.0–0.2)

## 2020-03-22 LAB — BASIC METABOLIC PANEL
Anion gap: 13 (ref 5–15)
BUN: 45 mg/dL — ABNORMAL HIGH (ref 8–23)
CO2: 26 mmol/L (ref 22–32)
Calcium: 9.3 mg/dL (ref 8.9–10.3)
Chloride: 103 mmol/L (ref 98–111)
Creatinine, Ser: 1.37 mg/dL — ABNORMAL HIGH (ref 0.44–1.00)
GFR calc Af Amer: 47 mL/min — ABNORMAL LOW (ref >60)
GFR calc non Af Amer: 40 mL/min — ABNORMAL LOW (ref >60)
Glucose, Bld: 100 mg/dL — ABNORMAL HIGH (ref 70–99)
Potassium: 4.1 mmol/L (ref 3.5–5.1)
Sodium: 142 mmol/L (ref 135–145)

## 2020-03-22 LAB — GLUCOSE, CAPILLARY
Glucose-Capillary: 191 mg/dL — ABNORMAL HIGH (ref 70–99)
Glucose-Capillary: 119 mg/dL — ABNORMAL HIGH (ref 70–99)
Glucose-Capillary: 172 mg/dL — ABNORMAL HIGH (ref 70–99)
Glucose-Capillary: 180 mg/dL — ABNORMAL HIGH (ref 70–99)
Glucose-Capillary: 266 mg/dL — ABNORMAL HIGH (ref 70–99)

## 2020-03-22 MED ORDER — GUAIFENESIN-DM 100-10 MG/5ML PO SYRP
5.0000 mL | ORAL_SOLUTION | ORAL | Status: DC | PRN
  Administered 2020-03-22 – 2020-03-29 (×8): 5 mL via ORAL
  Filled 2020-03-22 (×9): qty 5

## 2020-03-22 NOTE — Evaluation (Signed)
Physical Therapy Evaluation Patient Details Name: Suzanne Tran MRN: [id number] DOB: 08/29/1955 Today's Date: 03/22/2020   History of Present Illness  64 y.o. female with medical history significant for diabetes mellitus, chronic kidney disease stage IIIa, and chronic systolic CHF, now presenting with swelling, weight gain, shortness of breath, and difficulty with mobility due to R foot ulcer.  Clinical Impression  Pt admitted with above diagnosis. Pt limited to mobility at bedside per pt request due to being on lasix and needing to void bladder frequently. Pt requires mod A to come to sitting EOB with HOB elevated, use of bedrail and heavy cues for sequencing. Pt requires min A using rocking momentum to power up from sitting at elevated EOB with RW. Pt maintains flexed trunk over RW and wide BOS with slow, labored shuffling steps at bedside. Pt cued for pursed lip breathing with all mobility, desats to 85% on 2L but able to recover within 2 minutes of seated rest and pursed lip breathing. Pt currently with functional limitations due to the deficits listed below (see PT Problem List). Pt will benefit from skilled PT to increase their independence and safety with mobility to allow discharge to the venue listed below.       Follow Up Recommendations SNF    Equipment Recommendations  None recommended by PT    Recommendations for Other Services       Precautions / Restrictions Precautions Precautions: Fall Precaution Comments: R wound cast Restrictions Weight Bearing Restrictions: No      Mobility  Bed Mobility Overal bed mobility: Needs Assistance Bed Mobility: Supine to Sit  Supine to sit: Mod assist;HOB elevated  General bed mobility comments: slow, labored movement, cues for hand placement, assistance to bring BLE to EOB and to upright trunk with HOB elevated and use of bedrails, moderate SOB noted and cued for pursed lip breathing  Transfers Overall transfer level: Needs  assistance Equipment used: Rolling walker (2 wheeled) Transfers: Sit to/from [employer] Sit to Stand: Min assist;From elevated surface Stand pivot transfers: Min assist;From elevated surface  General transfer comment: rocking momentum with min A to power up from EOB, labored movement  Ambulation/Gait Ambulation/Gait assistance: Min assist   Assistive device: Rolling walker (2 wheeled) Gait Pattern/deviations: Step-to pattern;Shuffle;Wide base of support;Trunk flexed Gait velocity: decreased   General Gait Details: limited to slow, shuffling steps at bedside with trunk flexed onto RW for support and wide BOS, no LOB noted  Stairs            Wheelchair Mobility    Modified Rankin (Stroke Patients Only)       Balance Overall balance assessment: Needs assistance Sitting-balance support: Feet supported;Bilateral upper extremity supported Sitting balance-Leahy Scale: Fair Sitting balance - Comments: seated EOB   Standing balance support: During functional activity;Bilateral upper extremity supported Standing balance-Leahy Scale: Poor Standing balance comment: reliant on UE support                  Pertinent Vitals/Pain Pain Assessment: No/denies pain    Home Living Family/patient expects to be discharged to:: Private residence Living Arrangements: Alone Available Help at Discharge: Family;Available PRN/intermittently Type of Home: Apartment Home Access: Stairs to enter Entrance Stairs-Rails: None Entrance Stairs-Number of Steps: 1 Home Layout: One level Home Equipment: Walker - 2 wheels;Cane - single point;Other (comment);Bedside commode;Shower seat (rollator)      Prior Function Level of Independence: Independent with assistive device(s)         Comments: Pt reports ambalutes with rollator  in home and community, independent with ADLs, driving until got cast on R foot for cast. Pt reports currently receiving wound care for R foot ulcer.  Pt denies home O2. Pt reports falling daily for weeks at home, always falls near furniture and able to catch self and has never hit the floor.     Hand Dominance        Extremity/Trunk Assessment   Upper Extremity Assessment Upper Extremity Assessment: Overall WFL for tasks assessed    Lower Extremity Assessment Lower Extremity Assessment: Generalized weakness;RLE deficits/detail RLE Deficits / Details: cast in tact on RLE preventing ankle/foot mobility assessment, hip and knee AROM ~50% and grossly 3+/5 strength    Cervical / Trunk Assessment Cervical / Trunk Assessment: Normal  Communication   Communication: No difficulties  Cognition Arousal/Alertness: Awake/alert Behavior During Therapy: WFL for tasks assessed/performed Overall Cognitive Status: Within Functional Limits for tasks assessed       General Comments General comments (skin integrity, edema, etc.): on 2L O2 with SpO2 desat to 85% with mobility and 90-93% at rest, cuse for pursed lip breathing with mobility    Exercises     Assessment/Plan    PT Assessment Patient needs continued PT services  PT Problem List Decreased strength;Decreased range of motion;Decreased activity tolerance;Decreased balance;Decreased mobility;Cardiopulmonary status limiting activity;Impaired sensation;Obesity;Decreased skin integrity       PT Treatment Interventions DME instruction;Gait training;Functional mobility training;Therapeutic activities;Therapeutic exercise;Balance training;Neuromuscular re-education;Patient/family education    PT Goals (Current goals can be found in the Care Plan section)  Acute Rehab PT Goals Patient Stated Goal: "get out of here" Time For Goal Achievement: 04/05/20 Potential to Achieve Goals: Good    Frequency Min 2X/week   Barriers to discharge        Co-evaluation               AM-PAC PT "6 Clicks" Mobility  Outcome Measure Help needed turning from your back to your side while in a  flat bed without using bedrails?: A Lot Help needed moving from lying on your back to sitting on the side of a flat bed without using bedrails?: A Lot Help needed moving to and from a bed to a chair (including a wheelchair)?: A Lot Help needed standing up from a chair using your arms (e.g., wheelchair or bedside chair)?: A Little Help needed to walk in hospital room?: A Little Help needed climbing 3-5 steps with a railing? : A Lot 6 Click Score: 14    End of Session Equipment Utilized During Treatment: Oxygen (2L) Activity Tolerance: Patient limited by fatigue Patient left: in chair;with call bell/phone within reach Nurse Communication: Mobility status PT Visit Diagnosis: Unsteadiness on feet (R26.81);Other abnormalities of gait and mobility (R26.89);Repeated falls (R29.6);Muscle weakness (generalized) (M62.81)    Time: 1950-9326 PT Time Calculation (min) (ACUTE ONLY): 35 min   Charges:   PT Evaluation $PT Eval Moderate Complexity: 1 Mod PT Treatments $Therapeutic Activity: 8-22 mins         Tori Aaleigha Bozza PT, DPT 03/22/20, 1:01 PM [phone]

## 2020-03-22 NOTE — TOC Progression Note (Signed)
Transition of Care [facility]) - Progression Note   Patient Details  Name: Suzanne Tran MRN: [id number] Date of Birth: [birth year]  [facility]) CM/SW Contact  Ewing Schlein, LCSW Phone Number: 03/22/2020, 1:28 PM  Clinical Narrative: PT evaluation recommended SNF. CSW completed FL2; PASRR received. Patient was already faxed out for SNF. TOC to follow.  Expected Discharge Plan: Skilled Nursing Facility Barriers to Discharge: Continued Medical Work up  Expected Discharge Plan and Services Expected Discharge Plan: Skilled Nursing Facility Post Acute Care Choice: Skilled Nursing Facility Living arrangements for the past 2 months: Apartment  Readmission Risk Interventions Readmission Risk Prevention Plan 03/20/2020  Post Dischage Appt Complete  Medication Screening Complete  Transportation Screening Complete

## 2020-03-22 NOTE — Progress Notes (Signed)
PROGRESS NOTE  Suzanne Tran NIO:[id number] DOB: [birth year] DOA: 03/19/2020 PCP: Benita Stabile, MD   LOS: 3 days   Brief Narrative / Interim history: 64 year old female with IDDM, CKD stage IIIa, chronic right lower extremity wounds, chronic systolic CHF came into the hospital with complaints of swelling, weight gain, shortness of breath.  She reports difficulties with ambulating due to her right foot ulcer as she has a cast, and also difficulties due to her breathing.  She reports increasing abdominal swelling.  She occasionally skips her Lasix when she has an appointment and has to drive to [facility].  Chest x-ray on admission showed interstitial edema she was also found to be hypoxic requiring supplemental oxygen  Subjective / 24h Interval events: She feels better this morning, finally got some rest and slept through the night.  Appreciates her swelling is less as the cast on her left leg feels loose.  Assessment & Plan: Principal Problem Acute hypoxic respiratory failure due to acute pulmonary edema due to acute on chronic systolic CHF -patient remains grossly volume overloaded, she has been started on IV Lasix twice daily, continue to closely monitor ins and outs, daily weights, looks like she dropped 2 pounds, but these are bed weights.  Reassuringly she feels better -2D echo done 4 months ago showed EF 45-50%, dilated left atrium, will not repeat -She also underwent a Lexiscan 3 months ago which showed LVEF 32% -Renal function is stable, continue IV Lasix  Active Problems Chronic kidney disease stage IIIa -baseline creatinine 1.0-1.1, currently remains within acceptable range around 1.3.  Continue Lasix  Essential hypertension-hold losartan HCTZ to allow for diuresis.  Blood pressure much better on Coreg, continue Coreg  Obesity-definitely there is a component of fluid overload but she would benefit also from weight loss  Hepatosplenomegaly, concern for liver disease -CT scan  showed slight nodular capsular contours of the liver which can be seen with cirrhosis.  She has never drank alcohol.  Suspect this is [name], patient would benefit from weight loss  Cholelithiasis-no evidence of gallbladder inflammation on the CT scan, she is asymptomatic from the standpoint.  This can be addressed as an outpatient.  She is asymptomatic with this  Normocytic anemia, in the setting of chronic disease processes -monitor hemoglobin, stable, no bleeding  Chronic right lower extremity wound -follows with wound care in [place], she has a hard cast which apparently is being removed every 7 days or so.  Remains afebrile  Deconditioning -in the setting of severe fluid overload, poor mobility, will obtain physical therapy consult.  She probably needs SNF at this point but she needs to work with physical therapy  Insulin-dependent diabetes mellitus, well controlled, with hyperglycemia -Continue sliding scale, closely monitor CBGs.  A1c 6.3  CBG (last 3)  Recent Labs    03/21/20 1108 03/21/20 1617 03/21/20 2203  GLUCAP 194* 197* 232*    Scheduled Meds: . carvedilol  3.125 mg Oral BID WC  . enoxaparin (LOVENOX) injection  40 mg Subcutaneous Q24H  . furosemide  40 mg Intravenous Q12H  . insulin aspart  0-5 Units Subcutaneous QHS  . insulin aspart  0-9 Units Subcutaneous TID WC  . sodium chloride flush  3 mL Intravenous Q12H   Continuous Infusions: . sodium chloride     PRN Meds:.sodium chloride, acetaminophen, guaiFENesin-dextromethorphan, hydrALAZINE, ondansetron (ZOFRAN) IV, sodium chloride flush  Diet Orders (From admission, onward)    Start     Ordered   03/20/20 0027  Diet heart healthy/carb modified Room service  appropriate? Yes; Fluid consistency: Thin; Fluid restriction: 1500 mL Fluid  Diet effective now       Question Answer Comment  Diet-HS Snack? Nothing   Room service appropriate? Yes   Fluid consistency: Thin   Fluid restriction: 1500 mL Fluid       03/20/20 0026          DVT prophylaxis: enoxaparin (LOVENOX) injection 40 mg Start: 03/20/20 1000     Code Status: Full Code  Family Communication: no family at bedside   Status is: Inpatient  Remains inpatient appropriate because:IV treatments appropriate due to intensity of illness or inability to take PO and Inpatient level of care appropriate due to severity of illness   Dispo: The patient is from: Home              Anticipated d/c is to: SNF              Anticipated d/c date is: > 3 days              Patient currently is not medically stable to d/c.  Consultants:  None   Procedures:  None   Microbiology  None   Antimicrobials: None     Objective: Vitals:   03/21/20 0550 03/21/20 1243 03/21/20 2157 03/22/20 0523  BP: (!) 145/68 (!) 143/94 (!) 157/81 (!) 129/54  Pulse: 78 78 76 73  Resp: 20 18 18 17   Temp: 98 F (36.7 C) 98.5 F (36.9 C) 98.6 F (37 C) 98.7 F (37.1 C)  TempSrc:  Oral    SpO2: 95% 97% 96% 96%  Weight: (!) 151 kg   (!) 149.8 kg    Intake/Output Summary (Last 24 hours) at 03/22/2020 05/22/2020 Last data filed at 03/22/2020 0039 Gross per 24 hour  Intake --  Output 2300 ml  Net -2300 ml   Filed Weights   03/21/20 0550 03/22/20 0523  Weight: (!) 151 kg (!) 149.8 kg    Examination:  Constitutional: NAD Eyes: No scleral icterus ENMT: Moist mucous membranes Neck: normal, supple Respiratory: Difficult exam due to obesity, diminished at the bases but overall clear without wheezing, faint bibasilar crackles Cardiovascular: Regular rate and rhythm, no murmurs, 1+ pitting edema Abdomen: Soft, nontender, nondistended, positive bowel sounds Musculoskeletal: no clubbing / cyanosis.  Skin: Chronic venous stasis changes bilateral lower extremities, right foot is in a cast Neurologic: No focal deficits   Data Reviewed: I have independently reviewed following labs and imaging studies   CBC: Recent Labs  Lab 03/19/20 1835 03/20/20 0632  03/21/20 0616 03/22/20 0357  WBC 5.6 5.6 5.3 4.8  HGB 10.1* 10.0* 9.6* 9.6*  HCT 33.4* 33.0* 32.3* 31.8*  MCV 88.6 88.5 89.7 90.1  PLT 244 249 240 215   Basic Metabolic Panel: Recent Labs  Lab 03/19/20 1835 03/20/20 0632 03/21/20 0616 03/22/20 0357  NA 139 138 139 142  K 4.5 4.5 4.3 4.1  CL 107 105 104 103  CO2 23 23 25 26   GLUCOSE 150* 168* 185* 100*  BUN 41* 43* 43* 45*  CREATININE 1.25* 1.30* 1.34* 1.37*  CALCIUM 9.0 9.1 9.2 9.3   Liver Function Tests: Recent Labs  Lab 03/19/20 1835  AST 16  ALT 12  ALKPHOS 52  BILITOT 0.4  PROT 6.9  ALBUMIN 3.2*   Coagulation Profile: Recent Labs  Lab 03/19/20 1835  INR 1.2   HbA1C: Recent Labs    03/20/20 0632 03/21/20 0616  HGBA1C 6.0* 6.3*   CBG: Recent Labs  Lab 03/20/20  2036 03/21/20 0745 03/21/20 1108 03/21/20 1617 03/21/20 2203  GLUCAP 203* 180* 194* 197* 232*    Recent Results (from the past 240 hour(s))  SARS Coronavirus 2 by RT PCR (hospital order, performed in [facility] hospital lab) Nasopharyngeal Nasopharyngeal Swab     Status: None   Collection Time: 03/19/20  9:37 PM   Specimen: Nasopharyngeal Swab  Result Value Ref Range Status   SARS Coronavirus 2 NEGATIVE NEGATIVE Final    Comment: (NOTE) SARS-CoV-2 target nucleic acids are NOT DETECTED.  The SARS-CoV-2 RNA is generally detectable in upper and lower respiratory specimens during the acute phase of infection. The lowest concentration of SARS-CoV-2 viral copies this assay can detect is 250 copies / mL. A negative result does not preclude SARS-CoV-2 infection and should not be used as the sole basis for treatment or other patient management decisions.  A negative result may occur with improper specimen collection / handling, submission of specimen other than nasopharyngeal swab, presence of viral mutation(s) within the areas targeted by this assay, and inadequate number of viral copies (<250 copies / mL). A negative result must be  combined with clinical observations, patient history, and epidemiological information.  Fact Sheet for Patients:   BoilerBrush.com.cy  Fact Sheet for Healthcare Providers: https://pope.com/  This test is not yet approved or  cleared by the [place] FDA and has been authorized for detection and/or diagnosis of SARS-CoV-2 by FDA under an Emergency Use Authorization (EUA).  This EUA will remain in effect (meaning this test can be used) for the duration of the COVID-19 declaration under Section 564(b)(1) of the Act, 21 U.S.C. section 360bbb-3(b)(1), unless the authorization is terminated or revoked sooner.  Performed at [facility], [address], [place], [place] [address]      Radiology Studies: No results found.  Pamella Pert, MD, PhD [facility]  Between 7 am - 7 pm I am available, please contact me via Amion or Securechat  Between 7 pm - 7 am I am not available, please contact night coverage MD/APP via Amion

## 2020-03-22 NOTE — Plan of Care (Signed)
  Problem: Acute Rehab PT Goals(only PT should resolve) Goal: Pt will Roll Supine to Side Outcome: Progressing Flowsheets (Taken 03/22/2020 1350) Pt will Roll Supine to Side: with min assist Goal: Pt Will Go Supine/Side To Sit Outcome: Progressing Flowsheets (Taken 03/22/2020 1350) Pt will go Supine/Side to Sit: with minimal assist Goal: Patient Will Transfer Sit To/From Stand Outcome: Progressing Flowsheets (Taken 03/22/2020 1350) Patient will transfer sit to/from stand: with min guard assist Goal: Pt Will Transfer Bed To Chair/Chair To Bed Outcome: Progressing Flowsheets (Taken 03/22/2020 1350) Pt will Transfer Bed to Chair/Chair to Bed: min guard assist Goal: Pt Will Ambulate Outcome: Progressing Flowsheets (Taken 03/22/2020 1350) Pt will Ambulate:  > 125 feet  with minimal assist  with rolling walker   Tori Aldine Chakraborty PT, DPT 03/22/20, 1:51 PM [phone]

## 2020-03-22 NOTE — NC FL2 (Signed)
[place] MEDICAID FL2 LEVEL OF CARE SCREENING TOOL     IDENTIFICATION  Patient Name: Suzanne Tran Birthdate: [birth year] Sex: female Admission Date (Current Location): 03/19/2020  [place] and [place] Number:  Aaron Edelman [id number] R Facility and Address:  [facility],  [address] [place] [address]      Provider Number: [id number]  Attending Physician Name and Address:  Leatha Gilding, MD  Relative Name and Phone Number:  Lajuan Lines (brother) Ph: [phone]    Current Level of Care: Hospital Recommended Level of Care: Skilled Nursing Facility Prior Approval Number:    Date Approved/Denied:   PASRR Number: [id number]  Discharge Plan: SNF    Current Diagnoses: Patient Active Problem List   Diagnosis Date Noted  . Acute on chronic systolic (congestive) heart failure (HCC) 03/19/2020  . CKD (chronic kidney disease) stage 3, GFR 30-59 ml/min   . Type 2 diabetes mellitus (HCC)   . Hypertensive urgency   . Ulcer of right foot (HCC)     Orientation RESPIRATION BLADDER Height & Weight     Self, Time, Situation, Place  O2 (1L/min) Incontinent Weight: (!) 330 lb 4 oz (149.8 kg) Height:     BEHAVIORAL SYMPTOMS/MOOD NEUROLOGICAL BOWEL NUTRITION STATUS      Continent Diet (Heart healthy/carb modified)  AMBULATORY STATUS COMMUNICATION OF NEEDS Skin   Limited Assist Verbally Skin abrasions (Both legs)                       Personal Care Assistance Level of Assistance  Bathing, Feeding, Dressing Bathing Assistance: Limited assistance Feeding assistance: Independent Dressing Assistance: Limited assistance     Functional Limitations Info  Sight, Hearing, Speech Sight Info: Impaired Hearing Info: Adequate Speech Info: Adequate    SPECIAL CARE FACTORS FREQUENCY  PT (By licensed PT)     PT Frequency: 5x's/week              Contractures Contractures Info: Not present    Additional Factors Info  Code Status, Allergies, Insulin  Sliding Scale Code Status Info: Full Allergies Info: Codeine; Dilantin (Phenytoin)   Insulin Sliding Scale Info: 3 times daily with meals: CBG < 70: Implement Hypoglycemia Standing Orders and refer to Hypoglycemia Standing Orders sidebar report; CBG 70 - 120: 0 units; CBG 121 - 150: 1 unit; CBG 151 - 200: 2 units; CBG 201 - 250: 3 units; CBG 251 - 300: 5 units; CBG 301 - 350: 7 units; CBG 351 - 400: 9 units; CBG > 400: call MD and obtain STAT lab verification; Daily at bedtime: CBG < 70: Implement Hypoglycemia Standing Orders and refer to Hypoglycemia Standing Orders sidebar report; CBG 70 - 120: 0 units; CBG 121 - 150: 0 units; CBG 151 - 200: 0 units; CBG 201 - 250: 2 units; CBG 251 - 300: 3 units; CBG 301 - 350: 4 units; CBG 351 - 400: 5 units; CBG > 400: call MD and obtain STAT lab verification       Current Medications (03/22/2020):  This is the current hospital active medication list Current Facility-Administered Medications  Medication Dose Route Frequency Provider Last Rate Last Admin  . 0.9 %  sodium chloride infusion  250 mL Intravenous PRN Opyd, Lavone Neri, MD      . acetaminophen (TYLENOL) tablet 650 mg  650 mg Oral Q4H PRN Opyd, Lavone Neri, MD      . carvedilol (COREG) tablet 3.125 mg  3.125 mg Oral BID WC Leatha Gilding, MD  3.125 mg at 03/22/20 0933  . enoxaparin (LOVENOX) injection 40 mg  40 mg Subcutaneous Q24H Opyd, Lavone Neri, MD   40 mg at 03/22/20 0932  . furosemide (LASIX) injection 40 mg  40 mg Intravenous Q12H Opyd, Lavone Neri, MD   40 mg at 03/22/20 0933  . guaiFENesin-dextromethorphan (ROBITUSSIN DM) 100-10 MG/5ML syrup 5 mL  5 mL Oral Q4H PRN Leatha Gilding, MD      . hydrALAZINE (APRESOLINE) tablet 25 mg  25 mg Oral Q6H PRN Opyd, Lavone Neri, MD      . insulin aspart (novoLOG) injection 0-5 Units  0-5 Units Subcutaneous QHS Opyd, Lavone Neri, MD   2 Units at 03/21/20 2214  . insulin aspart (novoLOG) injection 0-9 Units  0-9 Units Subcutaneous TID WC Opyd, Lavone Neri, MD   2  Units at 03/22/20 1218  . ondansetron (ZOFRAN) injection 4 mg  4 mg Intravenous Q6H PRN Opyd, Lavone Neri, MD      . sodium chloride flush (NS) 0.9 % injection 3 mL  3 mL Intravenous Q12H Opyd, Lavone Neri, MD   3 mL at 03/22/20 1000  . sodium chloride flush (NS) 0.9 % injection 3 mL  3 mL Intravenous PRN Opyd, Lavone Neri, MD         Discharge Medications: Please see discharge summary for a list of discharge medications.  Relevant Imaging Results:  Relevant Lab Results:   Additional Information SSN: [id number]  Ewing Schlein, LCSW

## 2020-03-23 LAB — CBC
HCT: 31.8 % — ABNORMAL LOW (ref 36.0–46.0)
Hemoglobin: 9.7 g/dL — ABNORMAL LOW (ref 12.0–15.0)
MCH: 27 pg (ref 26.0–34.0)
MCHC: 30.5 g/dL (ref 30.0–36.0)
MCV: 88.6 fL (ref 80.0–100.0)
Platelets: 222 10*3/uL (ref 150–400)
RBC: 3.59 MIL/uL — ABNORMAL LOW (ref 3.87–5.11)
RDW: 15.2 % (ref 11.5–15.5)
WBC: 4.1 10*3/uL (ref 4.0–10.5)
nRBC: 0 % (ref 0.0–0.2)

## 2020-03-23 LAB — BASIC METABOLIC PANEL
Anion gap: 11 (ref 5–15)
BUN: 48 mg/dL — ABNORMAL HIGH (ref 8–23)
CO2: 29 mmol/L (ref 22–32)
Calcium: 9.2 mg/dL (ref 8.9–10.3)
Chloride: 101 mmol/L (ref 98–111)
Creatinine, Ser: 1.44 mg/dL — ABNORMAL HIGH (ref 0.44–1.00)
GFR calc Af Amer: 44 mL/min — ABNORMAL LOW (ref >60)
GFR calc non Af Amer: 38 mL/min — ABNORMAL LOW (ref >60)
Glucose, Bld: 223 mg/dL — ABNORMAL HIGH (ref 70–99)
Potassium: 4.2 mmol/L (ref 3.5–5.1)
Sodium: 141 mmol/L (ref 135–145)

## 2020-03-23 LAB — GLUCOSE, CAPILLARY
Glucose-Capillary: 203 mg/dL — ABNORMAL HIGH (ref 70–99)
Glucose-Capillary: 233 mg/dL — ABNORMAL HIGH (ref 70–99)
Glucose-Capillary: 157 mg/dL — ABNORMAL HIGH (ref 70–99)
Glucose-Capillary: 254 mg/dL — ABNORMAL HIGH (ref 70–99)

## 2020-03-23 MED ORDER — SALINE SPRAY 0.65 % NA SOLN
1.0000 | NASAL | Status: DC | PRN
  Administered 2020-03-23: 1 via NASAL
  Filled 2020-03-23: qty 44

## 2020-03-23 MED ORDER — CHLORHEXIDINE GLUCONATE CLOTH 2 % EX PADS
6.0000 | MEDICATED_PAD | Freq: Every day | CUTANEOUS | Status: DC
  Administered 2020-03-24 – 2020-03-30 (×6): 6 via TOPICAL

## 2020-03-23 MED ORDER — INSULIN GLARGINE 100 UNIT/ML ~~LOC~~ SOLN
5.0000 [IU] | Freq: Every day | SUBCUTANEOUS | Status: DC
  Administered 2020-03-23 – 2020-03-24 (×2): 5 [IU] via SUBCUTANEOUS
  Filled 2020-03-23 (×6): qty 0.05

## 2020-03-23 NOTE — Progress Notes (Signed)
Inpatient Diabetes Program Recommendations  AACE/ADA: New Consensus Statement on Inpatient Glycemic Control (2015)  Target Ranges:  Prepandial:   less than 140 mg/dL      Peak postprandial:   less than 180 mg/dL (1-2 hours)      Critically ill patients:  140 - 180 mg/dL   Lab Results  Component Value Date   GLUCAP 233 (H) 03/23/2020   HGBA1C 6.3 (H) 03/21/2020    Review of Glycemic Control Results for Suzanne Tran, Suzanne Tran (MRN [id number]) as of 03/23/2020 12:03  Ref. Range 03/22/2020 12:13 03/22/2020 16:18 03/22/2020 20:42 03/23/2020 07:44 03/23/2020 11:00  Glucose-Capillary Latest Ref Range: 70 - 99 mg/dL 810 (H) 175 (H) 102 (H) 203 (H) 233 (H)   Diabetes history: DM2 Outpatient Diabetes medications: Lantus 8 units qd + Novolin R tid meal coverage Current orders for Inpatient glycemic control: Novolog sensitive correction tid + hs 0-5 units  Inpatient Diabetes Program Recommendations:   Consider: -Lantus 5 units daily Secure chat sent to Dr. Pola Corn with recommendations.  Thank you, Billy Fischer. Jayvyn Haselton, RN, MSN, CDE  Diabetes Coordinator Inpatient Glycemic Control Team Team Pager [phone] (8am-5pm) 03/23/2020 12:05 PM

## 2020-03-23 NOTE — Progress Notes (Signed)
Foley inserted so that patient may ambulate and not have urinary incontinence which would get her cast wet.  Sat up in chair for several hours today. Now back in bed

## 2020-03-23 NOTE — Progress Notes (Signed)
PROGRESS NOTE  Suzanne Tran  DOB: Jul 22, 1955  PCP: Benita Stabile, MD TOI:[id number]  DOA: 03/19/2020  LOS: 4 days   Chief Complaint  Patient presents with  . Shortness of Breath  . abdominal swelling    Brief narrative: Suzanne Tran is a 64 y.o. female with PMH of morbid obesity, sleep apnea, DM2, HTN, HLD, CKD stage III, CHF, peripheral neuropathy who lives at home alone and uses a walker to get around. Patient presented to the ED on 03/19/2020 with complaint of progressively worsening weight gain, bilateral lower extremity edema progressing to abdominal distention and shortness of breath.  Patient was hypoxic in the ED required supplemental oxygen.  Chest x-ray on admission showed interstitial edema. She was admitted for CHF exacerbation and started on IV diuresis.  Subjective: Patient was seen and examined this morning. Uncomfortable in bed.  Still complains that she feels short of breath when the bed is laid on the lower angle.  Has a purewick catheter on.  Complains that she cannot work with PT because of the catheter falling off.  She wants a Foley catheter.  Assessment/Plan: Acute on chronic systolic CHF Essential hypertension -Patient presented grossly volume overloaded.  -Home meds include Lasix, losartan HCTZ. Inconsistent compliance to Lasix at home because of the trips to the bathroom. -Currently on IV Lasix twice daily.  Losartan and HCTZ on hold.  Not on a beta-blocker. -Net negative balance of 2.1 L last 24 hours and 6.4 L since admission.  -Patient seems very concerned about the risk of going into renal failure and dialysis.  Hence she doesn't want higher dose of Lasix.  We'll continue 40 mg IV twice daily for now.  -Continue daily weight, intake output, renal function, electrolytes and blood pressure monitoring. -Recent echo with EF 45 to 50 %.  Because of exacerbation, will repeat a limited echo to look at EF only.  Acute hypoxic respiratory failure  -Not on  supplemental oxygen at home.  Currently requiring 2 to 3 L by nasal cannula.  Chronic kidney disease stage IIIa  -baseline creatinine less than 1.3.  Slightly up at 1.44 today.  Secondary to diuresis.  Not enough of a rise to call AKI.  Continue to monitor Recent Labs    10/10/19 1506 03/19/20 1835 03/20/20 0632 03/21/20 0616 03/22/20 0357 03/23/20 0528  CREATININE 1.12* 1.25* 1.30* 1.34* 1.37* 1.44*   Type 2 diabetes mellitus -On Lantus and premeal sliding scale insulin at home. -Diabetes care coordinator consult appreciated. -Resume on Lantus 5 units daily today.  Continue sliding scale insulin with Accu-Cheks. Lab Results  Component Value Date   HGBA1C 6.3 (H) 03/21/2020   Recent Labs  Lab 03/22/20 1213 03/22/20 1618 03/22/20 2042 03/23/20 0744 03/23/20 1100  GLUCAP 172* 180* 266* 203* 233*   Hyperlipidemia -Not on statin.  Liver enzymes normal.  Unclear if she had side effects with it in the past.  Morbid obesity - Body mass index is 46.23 kg/m. Patient has been advised to make an attempt to improve diet and exercise patterns to aid in weight loss.  Obstructive sleep apnea - ?CPAP  Hepatic steatosis Suspect early liver cirrhosis -CT scan of abdomen showed slight nodular capsule contours of liver.   -No history of alcoholism.  Suspect NASH related to morbid obesity.   -Liver enzymes normal.   -Weight loss recommended.   Recent Labs  Lab 03/19/20 1835  AST 16  ALT 12  ALKPHOS 52  BILITOT 0.4  PROT 6.9  ALBUMIN 3.2*  Cholelithiasis-no evidence of gallbladder inflammation on the CT scan, she is asymptomatic from the standpoint.  This can be addressed as an outpatient.  She is asymptomatic with this  Normocytic anemia -in the setting of chronic disease processes -monitor hemoglobin, stable, no bleeding Recent Labs    10/10/19 1506 03/19/20 1835 03/20/20 0632 03/21/20 0616 03/22/20 0357 03/23/20 0528  HGB 10.6* 10.1* 10.0* 9.6* 9.6* 9.7*    Chronic right lower extremity wound -follows with wound care in [place], she has a hard cast which apparently is being removed every 7 days or so.  Remains afebrile  Deconditioning -in the setting of severe fluid overload, poor mobility, will obtain physical therapy consult.  She probably needs SNF at this point. -Continue to work with physical therapy.  Mobility: PT eval Code Status:   Code Status: Full Code  Nutritional status: Body mass index is 46.23 kg/m.     Diet Order            Diet heart healthy/carb modified Room service appropriate? Yes; Fluid consistency: Thin; Fluid restriction: 1500 mL Fluid  Diet effective now                 DVT prophylaxis: enoxaparin (LOVENOX) injection 40 mg Start: 03/20/20 1000   Antimicrobials:  None Fluid: None Consultants: None Family Communication:  None at bedside  Status is: Inpatient  Remains inpatient appropriate because: Ongoing diuresis.  Needs IV Lasix.  Dispo: The patient is from: Home              Anticipated d/c is to: SNF              Anticipated d/c date is: 3 days              Patient currently is not medically stable to d/c.       Infusions:  . sodium chloride      Scheduled Meds: . carvedilol  3.125 mg Oral BID WC  . enoxaparin (LOVENOX) injection  40 mg Subcutaneous Q24H  . furosemide  40 mg Intravenous Q12H  . insulin aspart  0-5 Units Subcutaneous QHS  . insulin aspart  0-9 Units Subcutaneous TID WC  . insulin glargine  5 Units Subcutaneous Daily  . sodium chloride flush  3 mL Intravenous Q12H    Antimicrobials: Anti-infectives (From admission, onward)   None      PRN meds: sodium chloride, acetaminophen, guaiFENesin-dextromethorphan, hydrALAZINE, ondansetron (ZOFRAN) IV, sodium chloride, sodium chloride flush   Objective: Vitals:   03/22/20 2103 03/23/20 0510  BP:  (!) 125/57  Pulse:  72  Resp:  20  Temp:  97.7 F (36.5 C)  SpO2: 93% 92%    Intake/Output Summary (Last  24 hours) at 03/23/2020 1350 Last data filed at 03/23/2020 0900 Gross per 24 hour  Intake --  Output 1800 ml  Net -1800 ml   Filed Weights   03/21/20 0550 03/22/20 0523 03/23/20 0500  Weight: (!) 151 kg (!) 149.8 kg (!) 149.5 kg   Weight change: -0.3 kg Body mass index is 46.23 kg/m.   Physical Exam: General exam: Appears calm and comfortable.  Mild discomfort because of positioning in bed Skin: No rashes, lesions or ulcers. HEENT: Atraumatic, normocephalic, supple neck, no obvious bleeding Lungs: Clear to auscultation bilaterally CVS: Regular rate and rhythm, no murmur GI/Abd soft, nontender, nondistended, bowel sound present CNS: Alert, awake, oriented x3 Psychiatry: Mood appropriate Extremities: Chronic bilateral pedal edema, stasis changes, right leg in cast, no calf tenderness  Data  Review: I have personally reviewed the laboratory data and studies available.  Recent Labs  Lab 03/19/20 1835 03/20/20 3244 03/21/20 0616 03/22/20 0357 03/23/20 0528  WBC 5.6 5.6 5.3 4.8 4.1  HGB 10.1* 10.0* 9.6* 9.6* 9.7*  HCT 33.4* 33.0* 32.3* 31.8* 31.8*  MCV 88.6 88.5 89.7 90.1 88.6  PLT 244 249 240 215 222   Recent Labs  Lab 03/19/20 1835 03/20/20 0632 03/21/20 0616 03/22/20 0357 03/23/20 0528  NA 139 138 139 142 141  K 4.5 4.5 4.3 4.1 4.2  CL 107 105 104 103 101  CO2 23 23 25 26 29   GLUCOSE 150* 168* 185* 100* 223*  BUN 41* 43* 43* 45* 48*  CREATININE 1.25* 1.30* 1.34* 1.37* 1.44*  CALCIUM 9.0 9.1 9.2 9.3 9.2    Signed, , MD [facility] Pager: [phone] (Secure Chat preferred). 03/23/2020

## 2020-03-23 NOTE — TOC Progression Note (Signed)
Transition of Care [facility]) - Progression Note    Patient Details  Name: Suzanne Tran MRN: [id number] Date of Birth: 07-Jan-1956  [facility] CM/SW Contact  Karn [place], [place] Phone Number: 03/23/2020, 8:46 AM  Clinical Narrative:  LCSW provided bed offers to pt. She accepts [facility]. Debbie at facility notified. TOC will continue to follow and assist with d/c planning.      Expected Discharge Plan: Skilled Nursing Facility Barriers to Discharge: Continued Medical Work up  Expected Discharge Plan and Services Expected Discharge Plan: Skilled Nursing Facility     Post Acute Care Choice: Skilled Nursing Facility Living arrangements for the past 2 months: Apartment                                       Social Determinants of Health (SDOH) Interventions    Readmission Risk Interventions Readmission Risk Prevention Plan 03/20/2020  Post Dischage Appt Complete  Medication Screening Complete  Transportation Screening Complete

## 2020-03-24 ENCOUNTER — Inpatient Hospital Stay (HOSPITAL_COMMUNITY): Payer: Medicare Other

## 2020-03-24 DIAGNOSIS — I5021 Acute systolic (congestive) heart failure: Secondary | ICD-10-CM

## 2020-03-24 LAB — CBC
HCT: 32.1 % — ABNORMAL LOW (ref 36.0–46.0)
Hemoglobin: 9.8 g/dL — ABNORMAL LOW (ref 12.0–15.0)
MCH: 27 pg (ref 26.0–34.0)
MCHC: 30.5 g/dL (ref 30.0–36.0)
MCV: 88.4 fL (ref 80.0–100.0)
Platelets: 218 10*3/uL (ref 150–400)
RBC: 3.63 MIL/uL — ABNORMAL LOW (ref 3.87–5.11)
RDW: 15.1 % (ref 11.5–15.5)
WBC: 4 10*3/uL (ref 4.0–10.5)
nRBC: 0 % (ref 0.0–0.2)

## 2020-03-24 LAB — LIPID PANEL
Cholesterol: 127 mg/dL (ref 0–200)
HDL: 39 mg/dL — ABNORMAL LOW (ref >40)
LDL Cholesterol: 71 mg/dL (ref 0–99)
Total CHOL/HDL Ratio: 3.3 RATIO
Triglycerides: 84 mg/dL (ref <150)
VLDL: 17 mg/dL (ref 0–40)

## 2020-03-24 LAB — ECHOCARDIOGRAM LIMITED
Area-P 1/2: 5.27 cm2
S' Lateral: 4.13 cm
Weight: 5160.53 oz

## 2020-03-24 LAB — GLUCOSE, CAPILLARY
Glucose-Capillary: 169 mg/dL — ABNORMAL HIGH (ref 70–99)
Glucose-Capillary: 194 mg/dL — ABNORMAL HIGH (ref 70–99)
Glucose-Capillary: 219 mg/dL — ABNORMAL HIGH (ref 70–99)
Glucose-Capillary: 204 mg/dL — ABNORMAL HIGH (ref 70–99)

## 2020-03-24 LAB — BASIC METABOLIC PANEL
Anion gap: 12 (ref 5–15)
BUN: 45 mg/dL — ABNORMAL HIGH (ref 8–23)
CO2: 28 mmol/L (ref 22–32)
Calcium: 9.2 mg/dL (ref 8.9–10.3)
Chloride: 100 mmol/L (ref 98–111)
Creatinine, Ser: 1.26 mg/dL — ABNORMAL HIGH (ref 0.44–1.00)
GFR calc Af Amer: 52 mL/min — ABNORMAL LOW (ref >60)
GFR calc non Af Amer: 45 mL/min — ABNORMAL LOW (ref >60)
Glucose, Bld: 194 mg/dL — ABNORMAL HIGH (ref 70–99)
Potassium: 3.8 mmol/L (ref 3.5–5.1)
Sodium: 140 mmol/L (ref 135–145)

## 2020-03-24 LAB — MAGNESIUM: Magnesium: 1.9 mg/dL (ref 1.7–2.4)

## 2020-03-24 MED ORDER — POTASSIUM CHLORIDE CRYS ER 20 MEQ PO TBCR
40.0000 meq | EXTENDED_RELEASE_TABLET | Freq: Once | ORAL | Status: AC
  Administered 2020-03-24: 40 meq via ORAL
  Filled 2020-03-24: qty 2

## 2020-03-24 MED ORDER — MAGNESIUM OXIDE 400 (241.3 MG) MG PO TABS
400.0000 mg | ORAL_TABLET | Freq: Once | ORAL | Status: AC
  Administered 2020-03-24: 400 mg via ORAL
  Filled 2020-03-24: qty 1

## 2020-03-24 NOTE — Progress Notes (Addendum)
PROGRESS NOTE  Suzanne Tran  DOB: April 04, 1956  PCP: Benita Stabile, MD FBP:[id number]  DOA: 03/19/2020  LOS: 5 days   Chief Complaint  Patient presents with  . Shortness of Breath  . abdominal swelling    Brief narrative: Suzanne Tran is a 64 y.o. female with PMH of morbid obesity, sleep apnea, DM2, HTN, HLD, CKD stage III, CHF, peripheral neuropathy who lives at home alone and uses a walker to get around. Patient presented to the ED on 03/19/2020 with complaint of progressively worsening weight gain, bilateral lower extremity edema progressing to abdominal distention and shortness of breath.  Patient was hypoxic in the ED required supplemental oxygen.  Chest x-ray on admission showed interstitial edema. She was admitted for CHF exacerbation and started on IV diuresis.  Subjective: Patient was seen and examined this morning. Feels better.  She is here.  She was able to finally fall asleep last night. Pulse much better after Foley catheter insertion.  I have made it clear to her that it will be for short period only with tentative plan to remove on Saturday. Net negative balance of 3.1 L in last 24 hours, 9.6 L since admission.  Creatinine improving  Assessment/Plan: Acute on chronic systolic CHF Essential hypertension -Patient presented grossly volume overloaded.  -Home meds include Lasix, losartan HCTZ. Inconsistent compliance to Lasix at home because of the trips to the bathroom. -Currently on IV Lasix 40 mg twice daily.  Losartan and HCTZ on hold.  Not on a beta-blocker. -Net negative balance of 3.1 L last 24 hours and 9.6 L since admission.  -Creatinine level is also improving as well. -We will continue the same dose of Lasix IV for now. -Continue daily weight, intake output, renal function, electrolytes and blood pressure monitoring. -Recent echo with EF 45 to 50 %.  Because of exacerbation, will repeat a limited echo to look at EF only.  Pending echo today. -Foley catheter  inserted on 9/8 for strict monitoring.  Acute hypoxic respiratory failure  -Not on supplemental oxygen at home.  Currently requiring 2 to 3 L by nasal cannula.  Hypokalemia/hypomagnesemia -Potassium level 3.8, magnesium level 1.9 today.  With ongoing diuresis, I would give oral replacement.  Chronic kidney disease stage IIIa  -baseline creatinine less than 1.3.  With appropriate amount of diuresis, creatinine level is stable.  Continue to monitor. Recent Labs    10/10/19 1506 03/19/20 1835 03/20/20 8242 03/21/20 0616 03/22/20 0357 03/23/20 0528 03/24/20 0556  CREATININE 1.12* 1.25* 1.30* 1.34* 1.37* 1.44* 1.26*   Type 2 diabetes mellitus -A1c 6.3 on 9/6  -on Lantus and premeal sliding scale insulin at home. -Diabetes care coordinator consult appreciated. -Currently on Lantus 5 units daily.  Continue sliding scale insulin with Accu-Cheks. Recent Labs  Lab 03/23/20 0744 03/23/20 1100 03/23/20 1619 03/23/20 2100 03/24/20 0810  GLUCAP 203* 233* 157* 254* 169*   History of hyperlipidemia -Not on statin.  Lipid panel seems to be normal.      Component Value Date/Time   CHOL 127 03/24/2020 0556   TRIG 84 03/24/2020 0556   HDL 39 (L) 03/24/2020 0556   CHOLHDL 3.3 03/24/2020 0556   VLDL 17 03/24/2020 0556   LDLCALC 71 03/24/2020 0556   Morbid obesity - Body mass index is 45.24 kg/m. Patient has been advised to make an attempt to improve diet and exercise patterns to aid in weight loss.  Obstructive sleep apnea - ?CPAP  Hepatic steatosis Suspect early liver cirrhosis -CT scan of abdomen showed  slight nodular capsule contours of liver.   -No history of alcoholism.  Suspect NASH related to morbid obesity.   -Liver enzymes normal.   -Weight loss recommended.   Recent Labs  Lab 03/19/20 1835  AST 16  ALT 12  ALKPHOS 52  BILITOT 0.4  PROT 6.9  ALBUMIN 3.2*   Cholelithiasis-no evidence of gallbladder inflammation on the CT scan, she is asymptomatic from the  standpoint.  This can be addressed as an outpatient.  She is asymptomatic with this  Normocytic anemia -in the setting of chronic disease processes -monitor hemoglobin, stable, no bleeding Recent Labs    10/10/19 1506 03/19/20 1835 03/20/20 0632 03/21/20 0616 03/22/20 0357 03/23/20 0528 03/24/20 0556  HGB 10.6* 10.1* 10.0* 9.6* 9.6* 9.7* 9.8*   Chronic right lower extremity wound -follows with wound care in [place], she has a hard cast which apparently is being removed every 7 days or so.  Remains afebrile  Deconditioning -in the setting of severe fluid overload, poor mobility, will obtain physical therapy consult.  She probably needs SNF at this point. -Continue to work with physical therapy.  Mobility: PT eval recommended SNF Code Status:   Code Status: Full Code  Nutritional status: Body mass index is 45.24 kg/m.     Diet Order            Diet heart healthy/carb modified Room service appropriate? Yes; Fluid consistency: Thin; Fluid restriction: 1500 mL Fluid  Diet effective now                 DVT prophylaxis: enoxaparin (LOVENOX) injection 40 mg Start: 03/20/20 1000   Antimicrobials:  None Fluid: None Consultants: None Family Communication:  None at bedside  Status is: Inpatient  Remains inpatient appropriate because: Ongoing diuresis.  Needs IV Lasix.  Dispo: The patient is from: Home              Anticipated d/c is to: SNF              Anticipated d/c date is: 3 days              Patient currently is not medically stable to d/c.    Infusions:  . sodium chloride      Scheduled Meds: . carvedilol  3.125 mg Oral BID WC  . Chlorhexidine Gluconate Cloth  6 each Topical Daily  . enoxaparin (LOVENOX) injection  40 mg Subcutaneous Q24H  . furosemide  40 mg Intravenous Q12H  . insulin aspart  0-5 Units Subcutaneous QHS  . insulin aspart  0-9 Units Subcutaneous TID WC  . insulin glargine  5 Units Subcutaneous Daily  . magnesium oxide  400 mg  Oral Once  . potassium chloride  40 mEq Oral Once  . sodium chloride flush  3 mL Intravenous Q12H    Antimicrobials: Anti-infectives (From admission, onward)   None      PRN meds: sodium chloride, acetaminophen, guaiFENesin-dextromethorphan, hydrALAZINE, ondansetron (ZOFRAN) IV, sodium chloride, sodium chloride flush   Objective: Vitals:   03/23/20 2020 03/24/20 0435  BP: (!) 153/69 (!) 135/56  Pulse: 75 72  Resp: 18 20  Temp: 98 F (36.7 C) 98.6 F (37 C)  SpO2: 99% 98%    Intake/Output Summary (Last 24 hours) at 03/24/2020 1213 Last data filed at 03/24/2020 0512 Gross per 24 hour  Intake 240 ml  Output 2900 ml  Net -2660 ml   Filed Weights   03/22/20 0523 03/23/20 0500 03/24/20 0436  Weight: (!) 149.8 kg Marland Kitchen)  149.5 kg (!) 146.3 kg   Weight change: -3.2 kg Body mass index is 45.24 kg/m.   Physical Exam: General exam: Appears calm and comfortable.  Feels better today Skin: No rashes, lesions or ulcers. HEENT: Atraumatic, normocephalic, supple neck, no obvious bleeding Lungs: Clear to auscultation bilaterally CVS: Regular rate and rhythm, no murmur GI/Abd soft, nontender, nondistended, bowel sound present CNS: Alert, awake, oriented x3 Psychiatry: Mood appropriate Extremities: Chronic bilateral pedal edema, stasis changes, right leg in cast, no calf tenderness.  Improving bilateral edema  Data Review: I have personally reviewed the laboratory data and studies available.  Recent Labs  Lab 03/20/20 [phone] 03/21/20 0616 03/22/20 0357 03/23/20 0528 03/24/20 0556  WBC 5.6 5.3 4.8 4.1 4.0  HGB 10.0* 9.6* 9.6* 9.7* 9.8*  HCT 33.0* 32.3* 31.8* 31.8* 32.1*  MCV 88.5 89.7 90.1 88.6 88.4  PLT 249 240 215 222 218   Recent Labs  Lab 03/20/20 0632 03/21/20 0616 03/22/20 0357 03/23/20 0528 03/24/20 0556  NA 138 139 142 141 140  K 4.5 4.3 4.1 4.2 3.8  CL 105 104 103 101 100  CO2 23 25 26 29 28   GLUCOSE 168* 185* 100* 223* 194*  BUN 43* 43* 45* 48* 45*   CREATININE 1.30* 1.34* 1.37* 1.44* 1.26*  CALCIUM 9.1 9.2 9.3 9.2 9.2  MG  --   --   --   --  1.9    Signed, , MD [facility] 03/24/2020

## 2020-03-24 NOTE — Progress Notes (Addendum)
Physical Therapy Treatment Patient Details Name: Suzanne Tran MRN: [id number] DOB: 01-20-1956 Today's Date: 03/24/2020    History of Present Illness 64 y.o. female with medical history significant for diabetes mellitus, chronic kidney disease stage IIIa, and chronic systolic CHF, now presenting with swelling, weight gain, shortness of breath, and difficulty with mobility due to R foot ulcer.    PT Comments    The patient was sitting up in the chair during the beginning of the session with O2 sat at 94% at 1.5L. O2 sat at rest on RA was 87%, so O2 reapplied. She reported she has been struggling with a decreased appetite since all of this started. Down mood was redirected successfully with encouragement. The patient had BLE weakness during therapeutic exercise but was able to complete with stable O2 and no SOB. She performed sit to stand min assist and required VC's to properly place hands within RW. She ambulated 60 feet RW, displaying reliance on RW for weight acceptance and flexed trunk. O2 remained at 94% with 2LPM throughout ambulation, although she did stop in order to talk on 3 occasions. Patient tolerated sitting up in chair after therapy- RN notified. PLAN: The patient will continue to benefit from skilled physical therapy services in hospital at recommended venue below in order to improve balance, gait, and ADL's to promote independence in functional activities.     Follow Up Recommendations  SNF     Equipment Recommendations  None recommended by PT    Recommendations for Other Services Other (comment) (pt is open to consult with RD given recent decline in appetite and struggles with proper diet)     Precautions / Restrictions Precautions Precautions: Fall Precaution Comments: R wound cast Restrictions Weight Bearing Restrictions: No    Mobility  Bed Mobility                  Transfers Overall transfer level: Needs assistance Equipment used: Rolling walker (2  wheeled) Transfers: Sit to/from Stand Sit to Stand: Min assist         General transfer comment: rocking momentum with min A to power up from EOB, labored movement; VC's to properly place hands in RW  Ambulation/Gait Ambulation/Gait assistance: Min guard Gait Distance (Feet): 60 Feet Assistive device: Rolling walker (2 wheeled) Gait Pattern/deviations: Step-to pattern;Shuffle;Wide base of support;Trunk flexed Gait velocity: decreased   General Gait Details: slow labored steps, pt stops to talk periodically; slightly down mood redirected with encouragement   Stairs             Wheelchair Mobility    Modified Rankin (Stroke Patients Only)       Balance Overall balance assessment: Needs assistance Sitting-balance support: Feet supported;Bilateral upper extremity supported Sitting balance-Leahy Scale: Fair Sitting balance - Comments: seated edge of chair   Standing balance support: During functional activity;Bilateral upper extremity supported Standing balance-Leahy Scale: Fair Standing balance comment: reliant on RW for BUE support during ambulation                            Cognition Arousal/Alertness: Awake/alert Behavior During Therapy: WFL for tasks assessed/performed Overall Cognitive Status: Within Functional Limits for tasks assessed                                        Exercises General Exercises - Lower Extremity Long Arc Quad: AROM;Strengthening;Seated;Both;10 reps Hip  Flexion/Marching: AROM;Strengthening;Seated;10 reps;Left Toe Raises: AROM;Strengthening;Seated;10 reps;Left Heel Raises: AROM;Strengthening;Seated;10 reps;Left    General Comments        Pertinent Vitals/Pain Pain Assessment: No/denies pain    Home Living                      Prior Function            PT Goals (current goals can now be found in the care plan section) Acute Rehab PT Goals Patient Stated Goal: "get out of here" Time  For Goal Achievement: 04/05/20 Potential to Achieve Goals: Good Progress towards PT goals: Progressing toward goals    Frequency    Min 2X/week      PT Plan Current plan remains appropriate    Co-evaluation              AM-PAC PT "6 Clicks" Mobility   Outcome Measure  Help needed turning from your back to your side while in a flat bed without using bedrails?: A Little Help needed moving from lying on your back to sitting on the side of a flat bed without using bedrails?: A Lot Help needed moving to and from a bed to a chair (including a wheelchair)?: A Little Help needed standing up from a chair using your arms (e.g., wheelchair or bedside chair)?: A Little Help needed to walk in hospital room?: A Little Help needed climbing 3-5 steps with a railing? : A Lot 6 Click Score: 16    End of Session Equipment Utilized During Treatment: Oxygen;Gait belt Activity Tolerance: Patient limited by fatigue;Patient tolerated treatment well Patient left: in chair;with call bell/phone within reach Nurse Communication: Mobility status PT Visit Diagnosis: Unsteadiness on feet (R26.81);Other abnormalities of gait and mobility (R26.89);Repeated falls (R29.6);Muscle weakness (generalized) (M62.81)     Time: 3704-8889 PT Time Calculation (min) (ACUTE ONLY): 30 min  Charges:  $Gait Training: 8-22 mins $Therapeutic Exercise: 8-22 mins                     1:58 PM , 03/24/20 Lorin Picket, SPT Physical Therapy with [place]  [facility] [phone] office  During this treatment session, the therapist was present, participating in and directing the treatment.  1:58 PM, 03/24/20 Ocie Bob, MPT Physical Therapist with [facility] 336 [phone] office [phone] mobile phone

## 2020-03-24 NOTE — Progress Notes (Signed)
*  PRELIMINARY RESULTS* Echocardiogram 2D Echocardiogram has been performed.  Suzanne Tran 03/24/2020, 12:48 PM

## 2020-03-25 LAB — BASIC METABOLIC PANEL
Anion gap: 8 (ref 5–15)
BUN: 43 mg/dL — ABNORMAL HIGH (ref 8–23)
CO2: 31 mmol/L (ref 22–32)
Calcium: 9 mg/dL (ref 8.9–10.3)
Chloride: 99 mmol/L (ref 98–111)
Creatinine, Ser: 1.39 mg/dL — ABNORMAL HIGH (ref 0.44–1.00)
GFR calc Af Amer: 46 mL/min — ABNORMAL LOW (ref >60)
GFR calc non Af Amer: 40 mL/min — ABNORMAL LOW (ref >60)
Glucose, Bld: 196 mg/dL — ABNORMAL HIGH (ref 70–99)
Potassium: 4.2 mmol/L (ref 3.5–5.1)
Sodium: 138 mmol/L (ref 135–145)

## 2020-03-25 LAB — GLUCOSE, CAPILLARY
Glucose-Capillary: 178 mg/dL — ABNORMAL HIGH (ref 70–99)
Glucose-Capillary: 218 mg/dL — ABNORMAL HIGH (ref 70–99)
Glucose-Capillary: 201 mg/dL — ABNORMAL HIGH (ref 70–99)
Glucose-Capillary: 243 mg/dL — ABNORMAL HIGH (ref 70–99)

## 2020-03-25 LAB — MAGNESIUM: Magnesium: 1.9 mg/dL (ref 1.7–2.4)

## 2020-03-25 MED ORDER — INSULIN GLARGINE 100 UNIT/ML ~~LOC~~ SOLN
10.0000 [IU] | Freq: Every day | SUBCUTANEOUS | Status: DC
  Administered 2020-03-25 – 2020-03-26 (×2): 10 [IU] via SUBCUTANEOUS
  Filled 2020-03-25 (×5): qty 0.1

## 2020-03-25 MED ORDER — MAGNESIUM OXIDE 400 (241.3 MG) MG PO TABS
400.0000 mg | ORAL_TABLET | Freq: Once | ORAL | Status: AC
  Administered 2020-03-25: 400 mg via ORAL
  Filled 2020-03-25: qty 1

## 2020-03-25 NOTE — Care Management Important Message (Signed)
Important Message  Patient Details  Name: Suzanne Tran MRN: [id number] Date of Birth: 09-Apr-1956   Medicare Important Message Given:  Yes     Corey Harold 03/25/2020, 1:46 PM

## 2020-03-25 NOTE — Progress Notes (Signed)
PROGRESS NOTE  Suzanne Tran  DOB: [birth year]  PCP: Benita Stabile, MD ZDG:[id number]  DOA: 03/19/2020  LOS: 6 days   Chief Complaint  Patient presents with  . Shortness of Breath  . abdominal swelling    Brief narrative: Suzanne Tran is a 64 y.o. female with PMH of morbid obesity, sleep apnea, DM2, HTN, HLD, CKD stage III, CHF, peripheral neuropathy who lives at home alone and uses a walker to get around. Patient presented to the ED on 03/19/2020 with complaint of progressively worsening weight gain, bilateral lower extremity edema progressing to abdominal distention and shortness of breath.  Patient was hypoxic in the ED required supplemental oxygen.  Chest x-ray on admission showed interstitial edema. She was admitted for CHF exacerbation and started on IV diuresis.  Subjective: Patient was seen and examined this morning. Sitting up in chair. Not in distress. Remains on supplemental oxygen. Blood pressure in 140s. Labs from this morning showed creatinine slightly up to 1.39. Intake output -net negative balance of 1.5 L in last 24 hours, 11 L since admission.  Assessment/Plan: Acute on chronic systolic CHF Essential hypertension -Patient presented grossly volume overloaded.  -Home meds include Lasix, losartan HCTZ. Inconsistent compliance to Lasix at home because of the trips to the bathroom. -Currently on IV Lasix 40 mg twice daily.  Losartan and HCTZ on hold.  Not on a beta-blocker. -net negative balance of 1.5 L in last 24 hours, 11 L since admission.admission.  -Creatinine level remained stable at baseline -We will continue the same dose of Lasix IV for now. -Continue daily weight, intake output, renal function, electrolytes and blood pressure monitoring. -Recent echo with EF 45 to 50 %.  Repeat echo this admission with slight decline in EF to 40%.   -Foley catheter inserted on 9/8 for strict monitoring.  Acute hypoxic respiratory failure  -Not on supplemental oxygen at  home.  Currently requiring 2 to 3 L by nasal cannula.  Hypokalemia/hypomagnesemia -With ongoing diuresis, potassium and magnesium level are being monitored and replaced. Recent Labs  Lab 03/21/20 0616 03/22/20 0357 03/23/20 0528 03/24/20 0556 03/25/20 0709  K 4.3 4.1 4.2 3.8 4.2  MG  --   --   --  1.9 1.9   Chronic kidney disease stage IIIa  -baseline creatinine less than 1.3.  With appropriate amount of diuresis, creatinine level is stable.  Continue to monitor. Recent Labs    10/10/19 1506 03/19/20 1835 03/20/20 9518 03/21/20 0616 03/22/20 0357 03/23/20 0528 03/24/20 0556 03/25/20 0709  CREATININE 1.12* 1.25* 1.30* 1.34* 1.37* 1.44* 1.26* 1.39*   Type 2 diabetes mellitus -A1c 6.3 on 9/6  -on Lantus and premeal sliding scale insulin at home. -Diabetes care coordinator consult appreciated. -Blood sugar level remains mostly over 200 on 5 units of Lantus.  Increase to 10 units for today. -Continue sliding scale insulin with Accu-Cheks. Recent Labs  Lab 03/24/20 1234 03/24/20 1630 03/24/20 2056 03/25/20 0723 03/25/20 1111  GLUCAP 194* 219* 204* 178* 218*   History of hyperlipidemia -Not on statin.  Lipid panel seems to be normal.      Component Value Date/Time   CHOL 127 03/24/2020 0556   TRIG 84 03/24/2020 0556   HDL 39 (L) 03/24/2020 0556   CHOLHDL 3.3 03/24/2020 0556   VLDL 17 03/24/2020 0556   LDLCALC 71 03/24/2020 0556   Morbid obesity - Body mass index is 44.06 kg/m. Patient has been advised to make an attempt to improve diet and exercise patterns to aid in weight  loss.  Obstructive sleep apnea - ?CPAP  Hepatic steatosis Suspect early liver cirrhosis -CT scan of abdomen showed slight nodular capsule contours of liver.   -No history of alcoholism.  Suspect NASH related to morbid obesity.   -Liver enzymes normal.   -Weight loss recommended.   Recent Labs  Lab 03/19/20 1835  AST 16  ALT 12  ALKPHOS 52  BILITOT 0.4  PROT 6.9  ALBUMIN 3.2*    Cholelithiasis-no evidence of gallbladder inflammation on the CT scan, she is asymptomatic from the standpoint.  This can be addressed as an outpatient.  She is asymptomatic with this  Normocytic anemia -in the setting of chronic disease processes -monitor hemoglobin, stable, no bleeding Recent Labs    10/10/19 1506 03/19/20 1835 03/20/20 0632 03/21/20 0616 03/22/20 0357 03/23/20 0528 03/24/20 0556  HGB 10.6* 10.1* 10.0* 9.6* 9.6* 9.7* 9.8*   Chronic right lower extremity wound  -follows up with podiatrist Dr. Sherlynn Carbon. with wound care in [place], she has a hard cast which apparently is usually removed every 7 days or so.   She could not follow-up this time because she is hospitalized. She will be in a SNF for next 2 to 3 weeks post discharge. -I spoke with the nurse Dorene Grebe at patient's podiatrist Dr. Sherlynn Carbon. Suggested to remove the cast since it is way past the date -Paged orthopedics.  Deconditioning -in the setting of severe fluid overload, poor mobility, will obtain physical therapy consult.  She probably needs SNF at this point. -Continue to work with physical therapy.  Mobility: PT eval recommended SNF Code Status:   Code Status: Full Code  Nutritional status: Body mass index is 44.06 kg/m.     Diet Order            Diet heart healthy/carb modified Room service appropriate? Yes; Fluid consistency: Thin; Fluid restriction: 1500 mL Fluid  Diet effective now                 DVT prophylaxis: enoxaparin (LOVENOX) injection 40 mg Start: 03/20/20 1000   Antimicrobials:  None Fluid: None Consultants: None Family Communication:  None at bedside  Status is: Inpatient  Remains inpatient appropriate because: Ongoing diuresis.  Needs IV Lasix.  Dispo: The patient is from: Home              Anticipated d/c is to: SNF              Anticipated d/c date is: 3 days              Patient currently is not medically stable to d/c.    Infusions:  . sodium  chloride      Scheduled Meds: . carvedilol  3.125 mg Oral BID WC  . Chlorhexidine Gluconate Cloth  6 each Topical Daily  . enoxaparin (LOVENOX) injection  40 mg Subcutaneous Q24H  . furosemide  40 mg Intravenous Q12H  . insulin aspart  0-5 Units Subcutaneous QHS  . insulin aspart  0-9 Units Subcutaneous TID WC  . insulin glargine  10 Units Subcutaneous Daily  . sodium chloride flush  3 mL Intravenous Q12H    Antimicrobials: Anti-infectives (From admission, onward)   None      PRN meds: sodium chloride, acetaminophen, guaiFENesin-dextromethorphan, hydrALAZINE, ondansetron (ZOFRAN) IV, sodium chloride, sodium chloride flush   Objective: Vitals:   03/24/20 2021 03/25/20 0548  BP: (!) 144/63 (!) 147/63  Pulse: 77 73  Resp: 18 18  Temp: 99.5 F (37.5 C) 98.6 F (  37 C)  SpO2: 93% 99%    Intake/Output Summary (Last 24 hours) at 03/25/2020 1503 Last data filed at 03/25/2020 0933 Gross per 24 hour  Intake 3 ml  Output 1450 ml  Net -1447 ml   Filed Weights   03/23/20 0500 03/24/20 0436 03/25/20 0500  Weight: (!) 149.5 kg (!) 146.3 kg (!) 142.5 kg   Weight change: -3.8 kg Body mass index is 44.06 kg/m.   Physical Exam: General exam: Appears calm and comfortable. Feels better today Skin: No rashes, lesions or ulcers. HEENT: Atraumatic, normocephalic, supple neck, no obvious bleeding Lungs: Clear to auscultation bilaterally. CVS: Regular rate and rhythm, no murmur GI/Abd soft, nontender, nondistended, bowel sound present CNS: Alert, awake, oriented x3 Psychiatry: Mood appropriate Extremities: Chronic bilateral pedal edema, stasis changes, no calf tenderness.  Improving bilateral edema. Right leg on the cast.  Data Review: I have personally reviewed the laboratory data and studies available.  Recent Labs  Lab 03/20/20 [phone] 03/21/20 0616 03/22/20 0357 03/23/20 0528 03/24/20 0556  WBC 5.6 5.3 4.8 4.1 4.0  HGB 10.0* 9.6* 9.6* 9.7* 9.8*  HCT 33.0* 32.3* 31.8* 31.8*  32.1*  MCV 88.5 89.7 90.1 88.6 88.4  PLT 249 240 215 222 218   Recent Labs  Lab 03/21/20 0616 03/22/20 0357 03/23/20 0528 03/24/20 0556 03/25/20 0709  NA 139 142 141 140 138  K 4.3 4.1 4.2 3.8 4.2  CL 104 103 101 100 99  CO2 25 26 29 28 31   GLUCOSE 185* 100* 223* 194* 196*  BUN 43* 45* 48* 45* 43*  CREATININE 1.34* 1.37* 1.44* 1.26* 1.39*  CALCIUM 9.2 9.3 9.2 9.2 9.0  MG  --   --   --  1.9 1.9    Signed, , MD [facility] 03/25/2020

## 2020-03-26 LAB — GLUCOSE, CAPILLARY
Glucose-Capillary: 189 mg/dL — ABNORMAL HIGH (ref 70–99)
Glucose-Capillary: 211 mg/dL — ABNORMAL HIGH (ref 70–99)
Glucose-Capillary: 237 mg/dL — ABNORMAL HIGH (ref 70–99)
Glucose-Capillary: 239 mg/dL — ABNORMAL HIGH (ref 70–99)

## 2020-03-26 LAB — BASIC METABOLIC PANEL
Anion gap: 12 (ref 5–15)
BUN: 45 mg/dL — ABNORMAL HIGH (ref 8–23)
CO2: 30 mmol/L (ref 22–32)
Calcium: 9.1 mg/dL (ref 8.9–10.3)
Chloride: 97 mmol/L — ABNORMAL LOW (ref 98–111)
Creatinine, Ser: 1.43 mg/dL — ABNORMAL HIGH (ref 0.44–1.00)
GFR calc Af Amer: 44 mL/min — ABNORMAL LOW (ref >60)
GFR calc non Af Amer: 38 mL/min — ABNORMAL LOW (ref >60)
Glucose, Bld: 202 mg/dL — ABNORMAL HIGH (ref 70–99)
Potassium: 4 mmol/L (ref 3.5–5.1)
Sodium: 139 mmol/L (ref 135–145)

## 2020-03-26 LAB — MAGNESIUM: Magnesium: 1.9 mg/dL (ref 1.7–2.4)

## 2020-03-26 MED ORDER — FUROSEMIDE 10 MG/ML IJ SOLN
40.0000 mg | Freq: Every day | INTRAMUSCULAR | Status: DC
  Administered 2020-03-27 – 2020-03-28 (×2): 40 mg via INTRAVENOUS
  Filled 2020-03-26 (×2): qty 4

## 2020-03-26 MED ORDER — INSULIN GLARGINE 100 UNIT/ML ~~LOC~~ SOLN
15.0000 [IU] | Freq: Every day | SUBCUTANEOUS | Status: DC
  Administered 2020-03-27: 15 [IU] via SUBCUTANEOUS
  Filled 2020-03-26 (×3): qty 0.15

## 2020-03-26 MED ORDER — BENZONATATE 100 MG PO CAPS
100.0000 mg | ORAL_CAPSULE | Freq: Three times a day (TID) | ORAL | Status: DC
  Administered 2020-03-26 – 2020-03-27 (×4): 100 mg via ORAL
  Filled 2020-03-26 (×4): qty 1

## 2020-03-26 NOTE — Progress Notes (Signed)
PROGRESS NOTE  Suzanne Tran  DOB: June 25, 1956  PCP: Benita Stabile, MD WUJ:[id number]  DOA: 03/19/2020  LOS: 7 days   Chief Complaint  Patient presents with  . Shortness of Breath  . abdominal swelling    Brief narrative: Suzanne Tran is a 64 y.o. female with PMH of morbid obesity, sleep apnea, DM2, HTN, HLD, CKD stage III, CHF, peripheral neuropathy who lives at home alone and uses a walker to get around. Patient presented to the ED on 03/19/2020 with complaint of progressively worsening weight gain, bilateral lower extremity edema progressing to abdominal distention and shortness of breath.  Patient was hypoxic in the ED required supplemental oxygen.  Chest x-ray on admission showed interstitial edema. She was admitted for CHF exacerbation and started on IV diuresis.  Subjective: Patient was seen and examined this morning. Sitting up in chair.  Not in distress.  Not on supplemental oxygen at rest today. Has a Foley catheter in. Blood glucose this morning 211  Assessment/Plan: Acute on chronic systolic CHF Essential hypertension -Patient presented grossly volume overloaded.  -Home meds include Lasix, losartan HCTZ. Inconsistent compliance to Lasix at home because of the trips to the bathroom. -Currently on IV Lasix 40 mg twice daily.  Losartan and HCTZ on hold.  Not on a beta-blocker. -net negative balance of 2.3 L in last 24 hours and 13.5 L since admission -Creatinine level slightly up to 1.43 today.  Will reduce Lasix to 40 mg IV daily.   -Continue daily weight, intake output, renal function, electrolytes and blood pressure monitoring. -Recent echo with EF 45 to 50 %.  Repeat echo this admission with slight decline in EF to 40%.   -Foley catheter inserted on 9/8 for strict monitoring.  Once her mobility improves, will DC Foley.  Acute hypoxic respiratory failure  -Not on supplemental oxygen at home.  Currently requiring 2 to 3 L by nasal  cannula.  Hypokalemia/hypomagnesemia -With ongoing diuresis, potassium and magnesium level are being monitored and replaced. Recent Labs  Lab 03/22/20 0357 03/23/20 0528 03/24/20 0556 03/25/20 0709 03/26/20 0738  K 4.1 4.2 3.8 4.2 4.0  MG  --   --  1.9 1.9 1.9   Chronic kidney disease stage IIIa  -baseline creatinine less than 1.3. With appropriate amount of diuresis, creatinine level is stable. Continue to monitor. Recent Labs    10/10/19 1506 03/19/20 1835 03/20/20 9562 03/21/20 0616 03/22/20 0357 03/23/20 0528 03/24/20 0556 03/25/20 0709 03/26/20 0738  CREATININE 1.12* 1.25* 1.30* 1.34* 1.37* 1.44* 1.26* 1.39* 1.43*   Type 2 diabetes mellitus -A1c 6.3 on 9/6  -on Lantus and premeal sliding scale insulin at home. -Diabetes care coordinator consult appreciated. -Blood sugar level remains mostly over 200 on 10 units of Lantus.  Increase to 15 units for tomorrow.. -Continue sliding scale insulin with Accu-Cheks. Recent Labs  Lab 03/25/20 1111 03/25/20 1717 03/25/20 2049 03/26/20 0752 03/26/20 1137  GLUCAP 218* 201* 243* 189* 211*   History of hyperlipidemia -Not on statin.  Lipid panel seems to be normal.      Component Value Date/Time   CHOL 127 03/24/2020 0556   TRIG 84 03/24/2020 0556   HDL 39 (L) 03/24/2020 0556   CHOLHDL 3.3 03/24/2020 0556   VLDL 17 03/24/2020 0556   LDLCALC 71 03/24/2020 0556   Morbid obesity - Body mass index is 44.06 kg/m. Patient has been advised to make an attempt to improve diet and exercise patterns to aid in weight loss.  Obstructive sleep apnea - ?CPAP  Hepatic steatosis Suspect early liver cirrhosis -CT scan of abdomen showed slight nodular capsule contours of liver.   -No history of alcoholism.  Suspect NASH related to morbid obesity.   -Liver enzymes normal.   -Weight loss recommended.   Recent Labs  Lab 03/19/20 1835  AST 16  ALT 12  ALKPHOS 52  BILITOT 0.4  PROT 6.9  ALBUMIN 3.2*   Cholelithiasis-no  evidence of gallbladder inflammation on the CT scan, she is asymptomatic from the standpoint.  This can be addressed as an outpatient.  She is asymptomatic with this  Normocytic anemia -in the setting of chronic disease processes -monitor hemoglobin, stable, no bleeding Recent Labs    10/10/19 1506 03/19/20 1835 03/20/20 0632 03/21/20 0616 03/22/20 0357 03/23/20 0528 03/24/20 0556  HGB 10.6* 10.1* 10.0* 9.6* 9.6* 9.7* 9.8*   Chronic right lower extremity wound  -follows up with podiatrist Dr. Sherlynn Carbon. with wound care in [place], she has a hard cast which apparently is usually removed every 7 days or so.   She could not follow-up this time because she is hospitalized. She will be in a SNF for next 2 to 3 weeks post discharge. -I spoke with the nurse Dorene Grebe at patient's podiatrist Dr. Sherlynn Carbon. Suggested to remove the cast since it is way past the date -Discussed with orthopedics Dr. Romeo Apple will remove the cast on Monday 9/13.  Deconditioning -in the setting of severe fluid overload, poor mobility, will obtain physical therapy consult.  She probably needs SNF at this point. -Continue to work with physical therapy.  Mobility: PT eval recommended SNF Code Status:   Code Status: Full Code  Nutritional status: Body mass index is 44.06 kg/m.     Diet Order            Diet heart healthy/carb modified Room service appropriate? Yes; Fluid consistency: Thin; Fluid restriction: 1500 mL Fluid  Diet effective now                 DVT prophylaxis: enoxaparin (LOVENOX) injection 40 mg Start: 03/20/20 1000   Antimicrobials:  None Fluid: None Consultants: None Family Communication:  None at bedside  Status is: Inpatient  Remains inpatient appropriate because: Ongoing diuresis.  Needs IV Lasix.  Dispo: The patient is from: Home              Anticipated d/c is to: SNF              Anticipated d/c date is: 3 days              Patient currently is not medically stable  to d/c.    Infusions:  . sodium chloride      Scheduled Meds: . benzonatate  100 mg Oral TID  . carvedilol  3.125 mg Oral BID WC  . Chlorhexidine Gluconate Cloth  6 each Topical Daily  . enoxaparin (LOVENOX) injection  40 mg Subcutaneous Q24H  . [START ON 03/27/2020] furosemide  40 mg Intravenous Daily  . insulin aspart  0-5 Units Subcutaneous QHS  . insulin aspart  0-9 Units Subcutaneous TID WC  . [START ON 03/27/2020] insulin glargine  15 Units Subcutaneous Daily  . sodium chloride flush  3 mL Intravenous Q12H    Antimicrobials: Anti-infectives (From admission, onward)   None      PRN meds: sodium chloride, acetaminophen, guaiFENesin-dextromethorphan, hydrALAZINE, ondansetron (ZOFRAN) IV, sodium chloride, sodium chloride flush   Objective: Vitals:   03/25/20 0548 03/25/20 2256  BP: (!) 147/63 (!) 149/65  Pulse: 73 72  Resp: 18 18  Temp: 98.6 F (37 C) 98.5 F (36.9 C)  SpO2: 99% 92%    Intake/Output Summary (Last 24 hours) at 03/26/2020 1412 Last data filed at 03/26/2020 1250 Gross per 24 hour  Intake 243 ml  Output 2950 ml  Net -2707 ml   Filed Weights   03/23/20 0500 03/24/20 0436 03/25/20 0500  Weight: (!) 149.5 kg (!) 146.3 kg (!) 142.5 kg   Weight change:  Body mass index is 44.06 kg/m.   Physical Exam: General exam: Appears calm and comfortable. Feels better today Skin: No rashes, lesions or ulcers. HEENT: Atraumatic, normocephalic, supple neck, no obvious bleeding Lungs: Clear to auscultation bilaterally CVS: Regular rate and rhythm, no murmur GI/Abd soft, nontender, nondistended, bowel sound present CNS: Alert, awake, oriented x3 Psychiatry: Mood appropriate Extremities: Chronic bilateral pedal edema, stasis changes, no calf tenderness. Improving bilateral edema. Right leg on the cast.  Data Review: I have personally reviewed the laboratory data and studies available.  Recent Labs  Lab 03/20/20 [id number] 03/21/20 0616 03/22/20 0357  03/23/20 0528 03/24/20 0556  WBC 5.6 5.3 4.8 4.1 4.0  HGB 10.0* 9.6* 9.6* 9.7* 9.8*  HCT 33.0* 32.3* 31.8* 31.8* 32.1*  MCV 88.5 89.7 90.1 88.6 88.4  PLT 249 240 215 222 218   Recent Labs  Lab 03/22/20 0357 03/23/20 0528 03/24/20 0556 03/25/20 0709 03/26/20 0738  NA 142 141 140 138 139  K 4.1 4.2 3.8 4.2 4.0  CL 103 101 100 99 97*  CO2 26 29 28 31 30   GLUCOSE 100* 223* 194* 196* 202*  BUN 45* 48* 45* 43* 45*  CREATININE 1.37* 1.44* 1.26* 1.39* 1.43*  CALCIUM 9.3 9.2 9.2 9.0 9.1  MG  --   --  1.9 1.9 1.9    Signed, , MD [facility] 03/26/2020

## 2020-03-27 LAB — CBC WITH DIFFERENTIAL/PLATELET
Abs Immature Granulocytes: 0.02 10*3/uL (ref 0.00–0.07)
Basophils Absolute: 0 10*3/uL (ref 0.0–0.1)
Basophils Relative: 1 %
Eosinophils Absolute: 0.3 10*3/uL (ref 0.0–0.5)
Eosinophils Relative: 5 %
HCT: 31.9 % — ABNORMAL LOW (ref 36.0–46.0)
Hemoglobin: 9.7 g/dL — ABNORMAL LOW (ref 12.0–15.0)
Immature Granulocytes: 0 %
Lymphocytes Relative: 29 %
Lymphs Abs: 1.5 10*3/uL (ref 0.7–4.0)
MCH: 27 pg (ref 26.0–34.0)
MCHC: 30.4 g/dL (ref 30.0–36.0)
MCV: 88.9 fL (ref 80.0–100.0)
Monocytes Absolute: 0.6 10*3/uL (ref 0.1–1.0)
Monocytes Relative: 11 %
Neutro Abs: 2.9 10*3/uL (ref 1.7–7.7)
Neutrophils Relative %: 54 %
Platelets: 198 10*3/uL (ref 150–400)
RBC: 3.59 MIL/uL — ABNORMAL LOW (ref 3.87–5.11)
RDW: 14.9 % (ref 11.5–15.5)
WBC: 5.3 10*3/uL (ref 4.0–10.5)
nRBC: 0 % (ref 0.0–0.2)

## 2020-03-27 LAB — GLUCOSE, CAPILLARY
Glucose-Capillary: 195 mg/dL — ABNORMAL HIGH (ref 70–99)
Glucose-Capillary: 229 mg/dL — ABNORMAL HIGH (ref 70–99)
Glucose-Capillary: 235 mg/dL — ABNORMAL HIGH (ref 70–99)
Glucose-Capillary: 253 mg/dL — ABNORMAL HIGH (ref 70–99)

## 2020-03-27 LAB — MAGNESIUM: Magnesium: 2 mg/dL (ref 1.7–2.4)

## 2020-03-27 LAB — URINALYSIS, ROUTINE W REFLEX MICROSCOPIC
Bilirubin Urine: NEGATIVE
Glucose, UA: NEGATIVE mg/dL
Ketones, ur: NEGATIVE mg/dL
Nitrite: NEGATIVE
Protein, ur: 100 mg/dL — AB
RBC / HPF: >50 RBC/hpf — ABNORMAL HIGH (ref 0–5)
Specific Gravity, Urine: 1.013 (ref 1.005–1.030)
WBC, UA: >50 WBC/hpf — ABNORMAL HIGH (ref 0–5)
pH: 5 (ref 5.0–8.0)

## 2020-03-27 LAB — BASIC METABOLIC PANEL
Anion gap: 9 (ref 5–15)
BUN: 46 mg/dL — ABNORMAL HIGH (ref 8–23)
CO2: 31 mmol/L (ref 22–32)
Calcium: 8.8 mg/dL — ABNORMAL LOW (ref 8.9–10.3)
Chloride: 96 mmol/L — ABNORMAL LOW (ref 98–111)
Creatinine, Ser: 1.44 mg/dL — ABNORMAL HIGH (ref 0.44–1.00)
GFR calc Af Amer: 44 mL/min — ABNORMAL LOW (ref >60)
GFR calc non Af Amer: 38 mL/min — ABNORMAL LOW (ref >60)
Glucose, Bld: 202 mg/dL — ABNORMAL HIGH (ref 70–99)
Potassium: 3.9 mmol/L (ref 3.5–5.1)
Sodium: 136 mmol/L (ref 135–145)

## 2020-03-27 LAB — SARS CORONAVIRUS 2 BY RT PCR (HOSPITAL ORDER, PERFORMED IN ~~LOC~~ HOSPITAL LAB): SARS Coronavirus 2: NEGATIVE

## 2020-03-27 MED ORDER — BENZONATATE 100 MG PO CAPS
200.0000 mg | ORAL_CAPSULE | Freq: Three times a day (TID) | ORAL | Status: DC
  Administered 2020-03-27 – 2020-03-31 (×12): 200 mg via ORAL
  Filled 2020-03-27 (×12): qty 2

## 2020-03-27 MED ORDER — INSULIN GLARGINE 100 UNIT/ML ~~LOC~~ SOLN
18.0000 [IU] | Freq: Every day | SUBCUTANEOUS | Status: DC
  Administered 2020-03-28 – 2020-03-29 (×2): 18 [IU] via SUBCUTANEOUS
  Filled 2020-03-27 (×3): qty 0.18

## 2020-03-27 MED ORDER — LORATADINE 10 MG PO TABS: 10.0000 mg | ORAL_TABLET | Freq: Every day | ORAL | Status: DC

## 2020-03-27 MED ORDER — LORATADINE 10 MG PO TABS
10.0000 mg | ORAL_TABLET | Freq: Every day | ORAL | Status: DC
  Administered 2020-03-27 – 2020-03-30 (×4): 10 mg via ORAL
  Filled 2020-03-27 (×4): qty 1

## 2020-03-27 NOTE — Progress Notes (Signed)
PROGRESS NOTE  Suzanne Tran  DOB: [birth year]  PCP: Benita Stabile, MD DDU:[id number]  DOA: 03/19/2020  LOS: 8 days   Chief Complaint  Patient presents with  . Shortness of Breath  . abdominal swelling    Brief narrative: Suzanne Tran is a 64 y.o. female with PMH of morbid obesity, sleep apnea, DM2, HTN, HLD, CKD stage III, CHF, peripheral neuropathy who lives at home alone and uses a walker to get around. Patient presented to the ED on 03/19/2020 with complaint of progressively worsening weight gain, bilateral lower extremity edema progressing to abdominal distention and shortness of breath.  Patient was hypoxic in the ED required supplemental oxygen.  Chest x-ray on admission showed interstitial edema. She was admitted for CHF exacerbation and started on IV diuresis.  Subjective: Patient was seen and examined this morning. Sitting up in chair.  Low-flow oxygen by nasal cannula. Patient expresses difficulty with fluid restriction and calorie restriction Foley catheter remains in. Dark-colored urine.  Complains of inability to sleep at night because of intermittent mild cough. Labs from this morning with creatinine stable.  Assessment/Plan: Acute on chronic systolic CHF Essential hypertension -Patient presented grossly volume overloaded.  -Home meds include Lasix, losartan HCTZ. Inconsistent compliance to Lasix at home because of the trips to the bathroom. -Currently on IV Lasix 40 mg daily.  Losartan and HCTZ on hold.  Not on a beta-blocker. -net negative balance of 800 mL in last 24 hours and 15 L since admission -Creatinine level stable at 1.44 today.  Continue Lasix to 40 mg IV daily.   -Continue daily weight, intake output, renal function, electrolytes and blood pressure monitoring. -Recent echo with EF 45 to 50 %.  Repeat echo this admission with slight decline in EF to 40%.   -Foley catheter can be removed today.  Encourage commode but okay to use Purewick.   Acute hypoxic  respiratory failure  -Not on supplemental oxygen at home.  Currently requiring 2 L by nasal cannula.  Wean down as tolerated.  Hypokalemia/hypomagnesemia -With ongoing diuresis, potassium and magnesium level are being monitored and replaced. Recent Labs  Lab 03/23/20 0528 03/24/20 0556 03/25/20 0709 03/26/20 0738 03/27/20 0802  K 4.2 3.8 4.2 4.0 3.9  MG  --  1.9 1.9 1.9 2.0   Chronic kidney disease stage IIIa  -baseline creatinine less than 1.3. With appropriate amount of diuresis, creatinine level is stable. Continue to monitor. Recent Labs    10/10/19 1506 03/19/20 1835 03/20/20 2376 03/21/20 2831 03/22/20 0357 03/23/20 0528 03/24/20 0556 03/25/20 0709 03/26/20 0738 03/27/20 0802  CREATININE 1.12* 1.25* 1.30* 1.34* 1.37* 1.44* 1.26* 1.39* 1.43* 1.44*   Type 2 diabetes mellitus -A1c 6.3 on 9/6  -on Lantus and premeal sliding scale insulin at home. -Diabetes care coordinator consult appreciated. -Blood sugar level remains elevated despite increasing dose of insulin.  Counseled for dietary control.   -Lantus dose increased to 18 units for this morning.   -Continue sliding scale insulin with Accu-Cheks. Recent Labs  Lab 03/26/20 1137 03/26/20 1616 03/26/20 2143 03/27/20 0731 03/27/20 1209  GLUCAP 211* 237* 239* 195* 229*   History of hyperlipidemia -Not on statin.  Lipid panel seems to be normal.      Component Value Date/Time   CHOL 127 03/24/2020 0556   TRIG 84 03/24/2020 0556   HDL 39 (L) 03/24/2020 0556   CHOLHDL 3.3 03/24/2020 0556   VLDL 17 03/24/2020 0556   LDLCALC 71 03/24/2020 0556   Morbid obesity - Body mass index  is 44 kg/m. Patient has been advised to make an attempt to improve diet and exercise patterns to aid in weight loss.  Obstructive sleep apnea -encourage use of CPAP at night.  Hepatic steatosis Suspect early liver cirrhosis -CT scan of abdomen showed slight nodular capsule contours of liver.   -No history of alcoholism.  Suspect  NASH related to morbid obesity.   -Liver enzymes normal.   -Weight loss recommended.    Cholelithiasis-no evidence of gallbladder inflammation on the CT scan, she is asymptomatic from the standpoint.  This can be addressed as an outpatient.  She is asymptomatic with this  Normocytic anemia -in the setting of chronic disease processes -monitor hemoglobin, stable, no bleeding Recent Labs    10/10/19 1506 03/19/20 1835 03/20/20 0632 03/21/20 0616 03/22/20 0357 03/23/20 0528 03/24/20 0556 03/27/20 0802  HGB 10.6* 10.1* 10.0* 9.6* 9.6* 9.7* 9.8* 9.7*   Chronic right lower extremity wound  -follows up with podiatrist Dr. Sherlynn Carbon. with wound care in [place], she has a hard cast which apparently is usually removed every 7 days or so.   She could not follow-up this time because she is hospitalized. She will be in a SNF for next 2 to 3 weeks post discharge. -I spoke with the nurse Dorene Grebe at patient's podiatrist Dr. Sherlynn Carbon. Suggested to remove the cast since it is way past the date -Discussed with orthopedics Dr. Romeo Apple will remove the cast on Monday 9/13.  Deconditioning -in the setting of severe fluid overload, poor mobility, will obtain physical therapy consult.  She probably needs SNF at this point. -Continue to work with physical therapy.  Dark urine -rule out hematuria.  Obtain urinalysis.    Nocturnal cough -Tessalon Perles scheduled.  Start Claritin today  Mobility: PT eval recommended SNF Code Status:   Code Status: Full Code  Nutritional status: Body mass index is 44 kg/m.     Diet Order            Diet heart healthy/carb modified Room service appropriate? Yes; Fluid consistency: Thin; Fluid restriction: 1500 mL Fluid  Diet effective now                 DVT prophylaxis: enoxaparin (LOVENOX) injection 40 mg Start: 03/20/20 1000   Antimicrobials:  None Fluid: None Consultants: None Family Communication:  None at bedside  Status is:  Inpatient  Remains inpatient appropriate because: Ongoing diuresis.  Needs IV Lasix.  Dispo: The patient is from: Home              Anticipated d/c is to: SNF              Anticipated d/c date is: 1 to 2 days              Patient currently is not medically stable to d/c.    Infusions:  . sodium chloride      Scheduled Meds: . benzonatate  200 mg Oral TID  . carvedilol  3.125 mg Oral BID WC  . Chlorhexidine Gluconate Cloth  6 each Topical Daily  . enoxaparin (LOVENOX) injection  40 mg Subcutaneous Q24H  . furosemide  40 mg Intravenous Daily  . insulin aspart  0-5 Units Subcutaneous QHS  . insulin aspart  0-9 Units Subcutaneous TID WC  . [START ON 03/28/2020] insulin glargine  18 Units Subcutaneous Daily  . loratadine  10 mg Oral QHS  . sodium chloride flush  3 mL Intravenous Q12H    Antimicrobials: Anti-infectives (From admission, onward)  None      PRN meds: sodium chloride, acetaminophen, guaiFENesin-dextromethorphan, hydrALAZINE, ondansetron (ZOFRAN) IV, sodium chloride, sodium chloride flush   Objective: Vitals:   03/26/20 2115 03/27/20 0524  BP: (!) 147/79 138/60  Pulse: 76 65  Resp: 20 18  Temp: 99.5 F (37.5 C) 99.1 F (37.3 C)  SpO2: 96% 96%    Intake/Output Summary (Last 24 hours) at 03/27/2020 1240 Last data filed at 03/27/2020 0300 Gross per 24 hour  Intake 480 ml  Output 1301 ml  Net -821 ml   Filed Weights   03/24/20 0436 03/25/20 0500 03/27/20 0500  Weight: (!) 146.3 kg (!) 142.5 kg (!) 142.3 kg   Weight change:  Body mass index is 44 kg/m.   Physical Exam: General exam: Appears calm and comfortable.  Not in physical distress.  Skin: No rashes, lesions or ulcers. HEENT: Atraumatic, normocephalic, supple neck, no obvious bleeding Lungs: Clear to auscultation bilaterally CVS: Regular rate and rhythm, no murmur GI/Abd soft, nontender, nondistended, bowel sound present CNS: Alert, awake, oriented x3 Psychiatry: Depressed  look Extremities: Chronic bilateral pedal edema, stasis changes, no calf tenderness. Improving bilateral edema. Right leg on the cast.  Data Review: I have personally reviewed the laboratory data and studies available.  Recent Labs  Lab 03/21/20 0616 03/22/20 0357 03/23/20 0528 03/24/20 0556 03/27/20 0802  WBC 5.3 4.8 4.1 4.0 5.3  NEUTROABS  --   --   --   --  2.9  HGB 9.6* 9.6* 9.7* 9.8* 9.7*  HCT 32.3* 31.8* 31.8* 32.1* 31.9*  MCV 89.7 90.1 88.6 88.4 88.9  PLT 240 215 222 218 198   Recent Labs  Lab 03/23/20 0528 03/24/20 0556 03/25/20 0709 03/26/20 0738 03/27/20 0802  NA 141 140 138 139 136  K 4.2 3.8 4.2 4.0 3.9  CL 101 100 99 97* 96*  CO2 29 28 31 30 31   GLUCOSE 223* 194* 196* 202* 202*  BUN 48* 45* 43* 45* 46*  CREATININE 1.44* 1.26* 1.39* 1.43* 1.44*  CALCIUM 9.2 9.2 9.0 9.1 8.8*  MG  --  1.9 1.9 1.9 2.0    Signed, , MD [facility] 03/27/2020

## 2020-03-28 LAB — GLUCOSE, CAPILLARY
Glucose-Capillary: 203 mg/dL — ABNORMAL HIGH (ref 70–99)
Glucose-Capillary: 267 mg/dL — ABNORMAL HIGH (ref 70–99)
Glucose-Capillary: 242 mg/dL — ABNORMAL HIGH (ref 70–99)
Glucose-Capillary: 224 mg/dL — ABNORMAL HIGH (ref 70–99)

## 2020-03-28 LAB — CBC WITH DIFFERENTIAL/PLATELET
Abs Immature Granulocytes: 0.03 10*3/uL (ref 0.00–0.07)
Basophils Absolute: 0 10*3/uL (ref 0.0–0.1)
Basophils Relative: 0 %
Eosinophils Absolute: 0.2 10*3/uL (ref 0.0–0.5)
Eosinophils Relative: 2 %
HCT: 31.5 % — ABNORMAL LOW (ref 36.0–46.0)
Hemoglobin: 9.3 g/dL — ABNORMAL LOW (ref 12.0–15.0)
Immature Granulocytes: 1 %
Lymphocytes Relative: 26 %
Lymphs Abs: 1.6 10*3/uL (ref 0.7–4.0)
MCH: 26.1 pg (ref 26.0–34.0)
MCHC: 29.5 g/dL — ABNORMAL LOW (ref 30.0–36.0)
MCV: 88.5 fL (ref 80.0–100.0)
Monocytes Absolute: 0.6 10*3/uL (ref 0.1–1.0)
Monocytes Relative: 10 %
Neutro Abs: 3.8 10*3/uL (ref 1.7–7.7)
Neutrophils Relative %: 61 %
Platelets: 206 10*3/uL (ref 150–400)
RBC: 3.56 MIL/uL — ABNORMAL LOW (ref 3.87–5.11)
RDW: 14.6 % (ref 11.5–15.5)
WBC: 6.2 10*3/uL (ref 4.0–10.5)
nRBC: 0 % (ref 0.0–0.2)

## 2020-03-28 MED ORDER — IBUPROFEN 400 MG PO TABS
400.0000 mg | ORAL_TABLET | Freq: Once | ORAL | Status: AC
  Administered 2020-03-28: 400 mg via ORAL
  Filled 2020-03-28: qty 1

## 2020-03-28 MED ORDER — PHENAZOPYRIDINE HCL 100 MG PO TABS
200.0000 mg | ORAL_TABLET | Freq: Three times a day (TID) | ORAL | Status: DC
  Administered 2020-03-28 – 2020-03-30 (×6): 200 mg via ORAL
  Filled 2020-03-28 (×6): qty 2

## 2020-03-28 NOTE — Progress Notes (Addendum)
Physical Therapy Treatment Patient Details Name: Suzanne Tran MRN: [id number] DOB: 1956/01/15 Today's Date: 03/28/2020    History of Present Illness 64 y.o. female with medical history significant for diabetes mellitus, chronic kidney disease stage IIIa, and chronic systolic CHF, now presenting with swelling, weight gain, shortness of breath, and difficulty with mobility due to R foot ulcer.    PT Comments    The patient was seated edge of bed with O2 sat at 94% with 4LPM at the beginning of the session. She tolerated seated therapeutic exercises with mild SOB but no decrease in oxygen. She performed sit to stand min assist with shakiness upon standing due to BLE weakness. She required VC's in order to properly place BUE's in RW. She ambulated 20 feet RW min assist/min guard with O2 at 95% 4LPM, demonstrating slow labored cadence with increased rate of breathing. Patient had requested not to walk out of the room due to the fact that she knew she would have an episode of incontinence. Patient tolerated sitting up in chair with after therapy- RN notified. PLAN: The patient will continue to benefit from skilled physical therapy services in hospital at recommended venue below in order to improve balance, gait, and ADL's to promote independence in functional activities.      Follow Up Recommendations  SNF     Equipment Recommendations  None recommended by PT    Recommendations for Other Services       Precautions / Restrictions Precautions Precautions: Fall Precaution Comments: R wound cast Restrictions Weight Bearing Restrictions: No    Mobility  Bed Mobility                  Transfers Overall transfer level: Needs assistance Equipment used: Rolling walker (2 wheeled) Transfers: Sit to/from Stand Sit to Stand: Min assist         General transfer comment: rocking momentum with min A to power up from EOB, labored movement; VC's to properly place hands in RW; shakiness  upon standing from BLE weakness  Ambulation/Gait Ambulation/Gait assistance: Min guard;Min assist Gait Distance (Feet): 20 Feet Assistive device: Rolling walker (2 wheeled) Gait Pattern/deviations: Step-to pattern;Shuffle;Wide base of support;Trunk flexed Gait velocity: decreased   General Gait Details: unable to walk past room due to pt report that she will become incontinent and was not afforded a catheter at this time; slow labored steps with heavy breathing   Stairs             Wheelchair Mobility    Modified Rankin (Stroke Patients Only)       Balance Overall balance assessment: Needs assistance Sitting-balance support: Feet supported;Bilateral upper extremity supported Sitting balance-Leahy Scale: Good Sitting balance - Comments: seated edge of chair   Standing balance support: During functional activity;Bilateral upper extremity supported Standing balance-Leahy Scale: Fair Standing balance comment: reliant on RW for BUE support during ambulation                            Cognition Arousal/Alertness: Awake/alert Behavior During Therapy: WFL for tasks assessed/performed Overall Cognitive Status: Within Functional Limits for tasks assessed                                        Exercises General Exercises - Lower Extremity Long Arc Quad: AROM;Strengthening;Seated;Both;5 reps Hip Flexion/Marching: AROM;Strengthening;Seated;Left;10 reps Toe Raises: AROM;Strengthening;Seated;Left;15 reps Heel Raises: AROM;Strengthening;Seated;Left;15 reps  General Comments        Pertinent Vitals/Pain Pain Assessment: No/denies pain    Home Living                      Prior Function            PT Goals (current goals can now be found in the care plan section) Acute Rehab PT Goals Patient Stated Goal: "get out of here" Time For Goal Achievement: 04/05/20 Potential to Achieve Goals: Good Progress towards PT goals: Progressing  toward goals    Frequency    Min 3X/week      PT Plan Current plan remains appropriate    Co-evaluation              AM-PAC PT "6 Clicks" Mobility   Outcome Measure  Help needed turning from your back to your side while in a flat bed without using bedrails?: A Little Help needed moving from lying on your back to sitting on the side of a flat bed without using bedrails?: A Lot Help needed moving to and from a bed to a chair (including a wheelchair)?: A Lot Help needed standing up from a chair using your arms (e.g., wheelchair or bedside chair)?: A Little Help needed to walk in hospital room?: A Little Help needed climbing 3-5 steps with a railing? : A Lot 6 Click Score: 15    End of Session Equipment Utilized During Treatment: Oxygen;Gait belt Activity Tolerance: Patient limited by fatigue;Patient tolerated treatment well Patient left: in chair;with call bell/phone within reach Nurse Communication: Mobility status PT Visit Diagnosis: Unsteadiness on feet (R26.81);Other abnormalities of gait and mobility (R26.89);Repeated falls (R29.6);Muscle weakness (generalized) (M62.81)     Time: 1410-1436 PT Time Calculation (min) (ACUTE ONLY): 26 min  Charges:  $Gait Training: 8-22 mins $Therapeutic Exercise: 8-22 mins                     4:34 PM , 03/28/20 Lorin Picket, SPT Physical Therapy with [facility] [phone] office   During this treatment session, the therapist was present, participating in and directing the treatment.  4:34 PM, 03/28/20 Ocie Bob, MPT Physical Therapist with [facility] 336 [phone] office [phone] mobile phone

## 2020-03-28 NOTE — Progress Notes (Signed)
PROGRESS NOTE  Suzanne Tran  DOB: 1955-11-11  PCP: Benita Stabile, MD TKP:[id number]  DOA: 03/19/2020  LOS: 9 days   Chief Complaint  Patient presents with  . Shortness of Breath  . abdominal swelling    Brief narrative: Suzanne Tran is a 64 y.o. female with PMH of morbid obesity, sleep apnea, DM2, HTN, HLD, CKD stage III, CHF, peripheral neuropathy who lives at home alone and uses a walker to get around. Patient presented to the ED on 03/19/2020 with complaint of progressively worsening weight gain, bilateral lower extremity edema progressing to abdominal distention and shortness of breath.  Patient was hypoxic in the ED required supplemental oxygen.  Chest x-ray on admission showed interstitial edema. She was admitted for CHF exacerbation and started on IV diuresis.  Subjective: Patient was seen and examined this morning. Sitting up in chair. Not in distress. on low-flow oxygen by nasal cannula. Foley catheter removed yesterday. Canister with dark red urine Patient is having blood-tinged urine for last 2 days. Also complains of burning. No fever. No WBC count.  Assessment/Plan: Acute on chronic systolic CHF Essential hypertension -Patient presented grossly volume overloaded.  -Home meds include Lasix, losartan HCTZ. Inconsistent compliance to Lasix at home because of the trips to the bathroom. -Currently on IV Lasix 40 mg daily.  Losartan and HCTZ on hold.  Not on a beta-blocker. -net negative balance of 800 mL in last 24 hours and 15 L since admission -Creatinine level stable.  Continue Lasix to 40 mg IV daily.   -Continue daily weight, intake output, renal function, electrolytes and blood pressure monitoring. -Recent echo with EF 45 to 50 %.  Repeat echo this admission with slight decline in EF to 40%.    Acute hypoxic respiratory failure  -Not on supplemental oxygen at home.  Currently requiring 2 L by nasal cannula.  Wean down as  tolerated.  Hypokalemia/hypomagnesemia -With ongoing diuresis, potassium and magnesium level are being monitored and replaced. -Repeat labs tomorrow. Recent Labs  Lab 03/23/20 0528 03/24/20 0556 03/25/20 0709 03/26/20 0738 03/27/20 0802  K 4.2 3.8 4.2 4.0 3.9  MG  --  1.9 1.9 1.9 2.0   Chronic kidney disease stage IIIa  -baseline creatinine less than 1.3. With appropriate amount of diuresis, creatinine level is stable. Continue to monitor. Recent Labs    10/10/19 1506 03/19/20 1835 03/20/20 5170 03/21/20 0174 03/22/20 0357 03/23/20 0528 03/24/20 0556 03/25/20 0709 03/26/20 0738 03/27/20 0802  CREATININE 1.12* 1.25* 1.30* 1.34* 1.37* 1.44* 1.26* 1.39* 1.43* 1.44*   Hematuria Chronic normocytic anemia -Since Foley catheter was inserted, patient complains of burning urination and also hematuria. No leukocytosis. No fever. Start on Pyridium. Monitor for hematuria. -Hemoglobin stable. Recent Labs    10/10/19 1506 03/19/20 1835 03/20/20 9449 03/21/20 0616 03/22/20 0357 03/23/20 0528 03/24/20 0556 03/27/20 0802 03/28/20 0716  HGB 10.6* 10.1* 10.0* 9.6* 9.6* 9.7* 9.8* 9.7* 9.3*   Type 2 diabetes mellitus -A1c 6.3 on 9/6  -on Lantus and premeal sliding scale insulin at home. -Diabetes care coordinator consult appreciated. -Blood sugar level remains elevated despite increasing dose of insulin.  Counseled for dietary control.   -Lantus dose currently at 18 units. -Continue sliding scale insulin with Accu-Cheks. Recent Labs  Lab 03/27/20 0731 03/27/20 1209 03/27/20 1555 03/27/20 2053 03/28/20 0756  GLUCAP 195* 229* 235* 253* 203*   History of hyperlipidemia -Not on statin. Lipid panel normal  Morbid obesity - Body mass index is 44 kg/m. Patient has been advised to make an  attempt to improve diet and exercise patterns to aid in weight loss.  Obstructive sleep apnea -encourage use of CPAP at night.  Hepatic steatosis Suspect early liver cirrhosis -CT scan  of abdomen showed slight nodular capsule contours of liver.   -No history of alcoholism.  Suspect NASH related to morbid obesity.   -Liver enzymes normal.   -Weight loss recommended.    Cholelithiasis -no evidence of gallbladder inflammation on the CT scan, she is asymptomatic from the standpoint.  This can be addressed as an outpatient.  She is asymptomatic with this  Chronic right lower extremity wound  -follows up with podiatrist Dr. Sherlynn Carbon. with wound care in [place], she has a hard cast which apparently is usually removed every 7 days or so.   She could not follow-up this time because she is hospitalized. She will be in a SNF for next 2 to 3 weeks post discharge. -9/10 I spoke with the nurse Dorene Grebe at patient's podiatrist Dr. Sherlynn Carbon. Suggested to remove the cast since it is way past the date -Discussed with orthopedics Dr. Romeo Apple will remove the cast today Monday 9/13.  Deconditioning -in the setting of severe fluid overload, poor mobility, will obtain physical therapy consult.  She probably needs SNF at this point. -Continue to work with physical therapy.  Nocturnal cough -Tessalon Perles and Claritin  Mobility: PT eval recommended SNF Code Status:   Code Status: Full Code  Nutritional status: Body mass index is 44 kg/m.     Diet Order            Diet heart healthy/carb modified Room service appropriate? Yes; Fluid consistency: Thin; Fluid restriction: 1500 mL Fluid  Diet effective now                 DVT prophylaxis: enoxaparin (LOVENOX) injection 40 mg Start: 03/20/20 1000   Antimicrobials:  None Fluid: None Consultants: None Family Communication:  None at bedside  Status is: Inpatient  Remains inpatient appropriate because: Unable to discharge today. Has hematuria, needs cast removed by orthopedics.  Dispo: The patient is from: Home              Anticipated d/c is to: SNF              Anticipated d/c date is: 1 to 2 days              Patient  currently is not medically stable to d/c.  Infusions:  . sodium chloride      Scheduled Meds: . benzonatate  200 mg Oral TID  . carvedilol  3.125 mg Oral BID WC  . Chlorhexidine Gluconate Cloth  6 each Topical Daily  . enoxaparin (LOVENOX) injection  40 mg Subcutaneous Q24H  . furosemide  40 mg Intravenous Daily  . insulin aspart  0-5 Units Subcutaneous QHS  . insulin aspart  0-9 Units Subcutaneous TID WC  . insulin glargine  18 Units Subcutaneous Daily  . loratadine  10 mg Oral QHS  . phenazopyridine  200 mg Oral TID WC  . sodium chloride flush  3 mL Intravenous Q12H    Antimicrobials: Anti-infectives (From admission, onward)   None      PRN meds: sodium chloride, acetaminophen, guaiFENesin-dextromethorphan, hydrALAZINE, ondansetron (ZOFRAN) IV, sodium chloride, sodium chloride flush   Objective: Vitals:   03/27/20 2214 03/28/20 0457  BP: (!) 158/74 (!) 128/54  Pulse: 79 78  Resp: 20 20  Temp: 99.6 F (37.6 C) 99.4 F (37.4 C)  SpO2: 95%  94%    Intake/Output Summary (Last 24 hours) at 03/28/2020 1109 Last data filed at 03/28/2020 0300 Gross per 24 hour  Intake --  Output 1325 ml  Net -1325 ml   Filed Weights   03/25/20 0500 03/27/20 0500 03/28/20 0500  Weight: (!) 142.5 kg (!) 142.3 kg (!) 142.3 kg   Weight change: 0 kg Body mass index is 44 kg/m.   Physical Exam: General exam: Appears calm and comfortable.  Not in physical distress. Canister with dark red urine Skin: No rashes, lesions or ulcers. HEENT: Atraumatic, normocephalic, supple neck, no obvious bleeding Lungs: Clear to auscultation bilaterally CVS: Regular rate and rhythm, no murmur GI/Abd soft, nontender, nondistended, bowel sound present CNS: Alert, awake, oriented x3 Psychiatry: Depressed look Extremities: Chronic bilateral pedal edema, stasis changes, no calf tenderness. Improving bilateral edema. Right leg on the cast.  Data Review: I have personally reviewed the laboratory data and  studies available.  Recent Labs  Lab 03/22/20 0357 03/23/20 0528 03/24/20 0556 03/27/20 0802 03/28/20 0716  WBC 4.8 4.1 4.0 5.3 6.2  NEUTROABS  --   --   --  2.9 3.8  HGB 9.6* 9.7* 9.8* 9.7* 9.3*  HCT 31.8* 31.8* 32.1* 31.9* 31.5*  MCV 90.1 88.6 88.4 88.9 88.5  PLT 215 222 218 198 206   Recent Labs  Lab 03/23/20 0528 03/24/20 0556 03/25/20 0709 03/26/20 0738 03/27/20 0802  NA 141 140 138 139 136  K 4.2 3.8 4.2 4.0 3.9  CL 101 100 99 97* 96*  CO2 29 28 31 30 31   GLUCOSE 223* 194* 196* 202* 202*  BUN 48* 45* 43* 45* 46*  CREATININE 1.44* 1.26* 1.39* 1.43* 1.44*  CALCIUM 9.2 9.2 9.0 9.1 8.8*  MG  --  1.9 1.9 1.9 2.0    Signed, , MD [facility] 03/28/2020

## 2020-03-29 ENCOUNTER — Inpatient Hospital Stay (HOSPITAL_COMMUNITY): Payer: Medicare Other

## 2020-03-29 DIAGNOSIS — L97511 Non-pressure chronic ulcer of other part of right foot limited to breakdown of skin: Secondary | ICD-10-CM

## 2020-03-29 LAB — BASIC METABOLIC PANEL
Anion gap: 9 (ref 5–15)
BUN: 58 mg/dL — ABNORMAL HIGH (ref 8–23)
CO2: 29 mmol/L (ref 22–32)
Calcium: 8.8 mg/dL — ABNORMAL LOW (ref 8.9–10.3)
Chloride: 97 mmol/L — ABNORMAL LOW (ref 98–111)
Creatinine, Ser: 1.68 mg/dL — ABNORMAL HIGH (ref 0.44–1.00)
GFR calc Af Amer: 37 mL/min — ABNORMAL LOW (ref >60)
GFR calc non Af Amer: 32 mL/min — ABNORMAL LOW (ref >60)
Glucose, Bld: 189 mg/dL — ABNORMAL HIGH (ref 70–99)
Potassium: 4.3 mmol/L (ref 3.5–5.1)
Sodium: 135 mmol/L (ref 135–145)

## 2020-03-29 LAB — GLUCOSE, CAPILLARY
Glucose-Capillary: 185 mg/dL — ABNORMAL HIGH (ref 70–99)
Glucose-Capillary: 177 mg/dL — ABNORMAL HIGH (ref 70–99)
Glucose-Capillary: 168 mg/dL — ABNORMAL HIGH (ref 70–99)
Glucose-Capillary: 208 mg/dL — ABNORMAL HIGH (ref 70–99)

## 2020-03-29 LAB — CBC WITH DIFFERENTIAL/PLATELET
Abs Immature Granulocytes: 0.02 10*3/uL (ref 0.00–0.07)
Basophils Absolute: 0 10*3/uL (ref 0.0–0.1)
Basophils Relative: 0 %
Eosinophils Absolute: 0.2 10*3/uL (ref 0.0–0.5)
Eosinophils Relative: 3 %
HCT: 32.2 % — ABNORMAL LOW (ref 36.0–46.0)
Hemoglobin: 9.7 g/dL — ABNORMAL LOW (ref 12.0–15.0)
Immature Granulocytes: 0 %
Lymphocytes Relative: 21 %
Lymphs Abs: 1.5 10*3/uL (ref 0.7–4.0)
MCH: 26.5 pg (ref 26.0–34.0)
MCHC: 30.1 g/dL (ref 30.0–36.0)
MCV: 88 fL (ref 80.0–100.0)
Monocytes Absolute: 0.8 10*3/uL (ref 0.1–1.0)
Monocytes Relative: 12 %
Neutro Abs: 4.4 10*3/uL (ref 1.7–7.7)
Neutrophils Relative %: 64 %
Platelets: 230 10*3/uL (ref 150–400)
RBC: 3.66 MIL/uL — ABNORMAL LOW (ref 3.87–5.11)
RDW: 14.6 % (ref 11.5–15.5)
WBC: 7 10*3/uL (ref 4.0–10.5)
nRBC: 0 % (ref 0.0–0.2)

## 2020-03-29 MED ORDER — INSULIN GLARGINE 100 UNIT/ML ~~LOC~~ SOLN
20.0000 [IU] | Freq: Every day | SUBCUTANEOUS | Status: DC
  Administered 2020-03-30 – 2020-03-31 (×2): 20 [IU] via SUBCUTANEOUS
  Filled 2020-03-29 (×3): qty 0.2

## 2020-03-29 MED ORDER — POVIDONE-IODINE 10 % EX SOLN
CUTANEOUS | Status: AC
  Filled 2020-03-29: qty 15

## 2020-03-29 MED ORDER — SODIUM CHLORIDE 0.9 % IV SOLN
1.0000 g | INTRAVENOUS | Status: DC
  Administered 2020-03-29 – 2020-03-31 (×3): 1 g via INTRAVENOUS
  Filled 2020-03-29 (×3): qty 10

## 2020-03-29 NOTE — Progress Notes (Signed)
Patient ID: Suzanne Tran, female   DOB: March 01, 1956, 64 y.o.   MRN: [id number] The patient has a total contact cast on the right  I was able to talk to her physician's nurse through a secretary yesterday about 5:00 and they indicated that it is okay to take the total contact cast off place a Betadine dressing and a postop shoe  The clamshell cam walker is not available at our facility and would have to be ordered and may delay the patient's discharge even more  Recommend removing the cast and following the suggestion of a postop shoe with Betadine dressing if the patient is agreeable we can do that this morning.

## 2020-03-29 NOTE — Consult Note (Signed)
Reason for Consult: Charcot foot and diabetic foot ulcer Referring Physician: DR Lorin Glass  Suzanne Tran is an 64 y.o. female.  HPI: 64 year old female presented to the hospital for congestive heart failure but in the course of her work-up it was found out that she was wearing a total contact cast placed in [place].  The cast needed to come off and the patient cannot make it back to the doctor's office.  I was asked to remove the cast.  I had to call Dr. Bennett Scrape and speak to his nurse regarding treatment because it was a discrepancy between what the patient said and what was relayed to me in the consultation request.  In any event I was able to speak to them yesterday and they advised me to take the cast off place a Betadine dressing and a postop shoe or hard soled shoe.  Patient is agreeable.    Past Medical History:  Diagnosis Date   Charcot ankle, right    CKD (chronic kidney disease) stage 3, GFR 30-59 ml/min    Diabetic Charcot foot (HCC)    Essential hypertension    Mixed hyperlipidemia    Nephrolithiasis    Neuropathy    OSA (obstructive sleep apnea)    Type 2 diabetes mellitus (HCC)     Past Surgical History:  Procedure Laterality Date   ANKLE SURGERY     KNEE SURGERY     REPLACEMENT TOTAL KNEE     TONSILLECTOMY      Family History  Problem Relation Age of Onset   Diabetes Mellitus II Mother    Cancer Mother    Hypertension Father    CAD Father    Diabetes Mellitus II Father    Diabetes Sister    Diabetes Brother     Social History:  reports that she has never smoked. She has never used smokeless tobacco. She reports that she does not drink alcohol and does not use drugs.  Allergies:  Allergies  Allergen Reactions   Codeine     Rash and oral swelling    Dilantin [Phenytoin] Other (See Comments)    seizure       Results for orders placed or performed during the hospital encounter of 03/19/20 (from the past 48 hour(s))   Glucose, capillary     Status: Abnormal   Collection Time: 03/27/20 12:09 PM  Result Value Ref Range   Glucose-Capillary 229 (H) 70 - 99 mg/dL    Comment: Glucose reference range applies only to samples taken after fasting for at least 8 hours.  Urinalysis, Routine w reflex microscopic Urine, Random     Status: Abnormal   Collection Time: 03/27/20 12:35 PM  Result Value Ref Range   Color, Urine AMBER (A) YELLOW    Comment: BIOCHEMICALS MAY BE AFFECTED BY COLOR   APPearance CLOUDY (A) CLEAR   Specific Gravity, Urine 1.013 1.005 - 1.030   pH 5.0 5.0 - 8.0   Glucose, UA NEGATIVE NEGATIVE mg/dL   Hgb urine dipstick LARGE (A) NEGATIVE   Bilirubin Urine NEGATIVE NEGATIVE   Ketones, ur NEGATIVE NEGATIVE mg/dL   Protein, ur 884 (A) NEGATIVE mg/dL   Nitrite NEGATIVE NEGATIVE   Leukocytes,Ua MODERATE (A) NEGATIVE   RBC / HPF >50 (H) 0 - 5 RBC/hpf   WBC, UA >50 (H) 0 - 5 WBC/hpf   Bacteria, UA MANY (A) NONE SEEN   WBC Clumps PRESENT     Comment: Performed at [facility], [address] [place], [place] [address]  Glucose, capillary     Status: Abnormal   Collection Time: 03/27/20  3:55 PM  Result Value Ref Range   Glucose-Capillary 235 (H) 70 - 99 mg/dL    Comment: Glucose reference range applies only to samples taken after fasting for at least 8 hours.  Glucose, capillary     Status: Abnormal   Collection Time: 03/27/20  8:53 PM  Result Value Ref Range   Glucose-Capillary 253 (H) 70 - 99 mg/dL    Comment: Glucose reference range applies only to samples taken after fasting for at least 8 hours.  CBC with Differential/Platelet     Status: Abnormal   Collection Time: 03/28/20  7:16 AM  Result Value Ref Range   WBC 6.2 4.0 - 10.5 K/uL   RBC 3.56 (L) 3.87 - 5.11 MIL/uL   Hemoglobin 9.3 (L) 12.0 - 15.0 g/dL   HCT 46.2 (L) 36 - 46 %   MCV 88.5 80.0 - 100.0 fL   MCH 26.1 26.0 - 34.0 pg   MCHC 29.5 (L) 30.0 - 36.0 g/dL   RDW 70.3 50.0 - 93.8 %   Platelets 206 150 - 400 K/uL   nRBC 0.0  0.0 - 0.2 %   Neutrophils Relative % 61 %   Neutro Abs 3.8 1.7 - 7.7 K/uL   Lymphocytes Relative 26 %   Lymphs Abs 1.6 0.7 - 4.0 K/uL   Monocytes Relative 10 %   Monocytes Absolute 0.6 0 - 1 K/uL   Eosinophils Relative 2 %   Eosinophils Absolute 0.2 0 - 0 K/uL   Basophils Relative 0 %   Basophils Absolute 0.0 0 - 0 K/uL   Immature Granulocytes 1 %   Abs Immature Granulocytes 0.03 0.00 - 0.07 K/uL    Comment: Performed at [facility], [address], [place], [place] [address]  Glucose, capillary     Status: Abnormal   Collection Time: 03/28/20  7:56 AM  Result Value Ref Range   Glucose-Capillary 203 (H) 70 - 99 mg/dL    Comment: Glucose reference range applies only to samples taken after fasting for at least 8 hours.   Comment 1 Notify RN    Comment 2 Document in Chart   Glucose, capillary     Status: Abnormal   Collection Time: 03/28/20 12:05 PM  Result Value Ref Range   Glucose-Capillary 267 (H) 70 - 99 mg/dL    Comment: Glucose reference range applies only to samples taken after fasting for at least 8 hours.   Comment 1 Notify RN    Comment 2 Document in Chart   Glucose, capillary     Status: Abnormal   Collection Time: 03/28/20  4:39 PM  Result Value Ref Range   Glucose-Capillary 242 (H) 70 - 99 mg/dL    Comment: Glucose reference range applies only to samples taken after fasting for at least 8 hours.   Comment 1 Notify RN    Comment 2 Document in Chart   Glucose, capillary     Status: Abnormal   Collection Time: 03/28/20  9:17 PM  Result Value Ref Range   Glucose-Capillary 224 (H) 70 - 99 mg/dL    Comment: Glucose reference range applies only to samples taken after fasting for at least 8 hours.  CBC with Differential/Platelet     Status: Abnormal   Collection Time: 03/29/20  6:07 AM  Result Value Ref Range   WBC 7.0 4.0 - 10.5 K/uL   RBC 3.66 (L) 3.87 - 5.11 MIL/uL  Hemoglobin 9.7 (L) 12.0 - 15.0 g/dL   HCT 40.9 (L) 36 - 46 %   MCV 88.0 80.0 - 100.0 fL   MCH 26.5  26.0 - 34.0 pg   MCHC 30.1 30.0 - 36.0 g/dL   RDW 81.1 91.4 - 78.2 %   Platelets 230 150 - 400 K/uL   nRBC 0.0 0.0 - 0.2 %   Neutrophils Relative % 64 %   Neutro Abs 4.4 1.7 - 7.7 K/uL   Lymphocytes Relative 21 %   Lymphs Abs 1.5 0.7 - 4.0 K/uL   Monocytes Relative 12 %   Monocytes Absolute 0.8 0 - 1 K/uL   Eosinophils Relative 3 %   Eosinophils Absolute 0.2 0 - 0 K/uL   Basophils Relative 0 %   Basophils Absolute 0.0 0 - 0 K/uL   Immature Granulocytes 0 %   Abs Immature Granulocytes 0.02 0.00 - 0.07 K/uL    Comment: Performed at [facility], [address] [place], [place] [address]  Basic metabolic panel     Status: Abnormal   Collection Time: 03/29/20  6:07 AM  Result Value Ref Range   Sodium 135 135 - 145 mmol/L   Potassium 4.3 3.5 - 5.1 mmol/L   Chloride 97 (L) 98 - 111 mmol/L   CO2 29 22 - 32 mmol/L   Glucose, Bld 189 (H) 70 - 99 mg/dL    Comment: Glucose reference range applies only to samples taken after fasting for at least 8 hours.   BUN 58 (H) 8 - 23 mg/dL   Creatinine, Ser 3.08 (H) 0.44 - 1.00 mg/dL   Calcium 8.8 (L) 8.9 - 10.3 mg/dL   GFR calc non Af Amer 32 (L) >60 mL/min   GFR calc Af Amer 37 (L) >60 mL/min   Anion gap 9 5 - 15    Comment: Performed at [facility], [address], [place], [place] [address]  Glucose, capillary     Status: Abnormal   Collection Time: 03/29/20  7:28 AM  Result Value Ref Range   Glucose-Capillary 185 (H) 70 - 99 mg/dL    Comment: Glucose reference range applies only to samples taken after fasting for at least 8 hours.    No results found.  Review of Systems Blood pressure (!) 152/70, pulse 75, temperature 99.1 F (37.3 C), resp. rate 20, weight (!) 142.3 kg, SpO2 97 %. Physical Exam Vitals reviewed.  Constitutional:      General: She is not in acute distress.    Appearance: She is well-developed. She is obese. She is not toxic-appearing.  HENT:     Head: Normocephalic.  Cardiovascular:     Rate and Rhythm: Normal  rate.  Musculoskeletal:     Right lower leg: Edema present.     Left lower leg: Edema present.  Neurological:     Mental Status: She is alert.    Total contact cast removed  On the plantar aspect of the right foot: There is a small ulcer in the setting of a deformity of the right foot secondary to Charcot arthropathy  The ulcer is shallow there is some surrounding erythema there is no purulence   Assessment/Plan: Diabetic foot ulcer from Charcot disease with foot deformity.  Patient will follow up with her physician in [place].  Suzanne Tran 03/29/2020, 10:27 AM

## 2020-03-29 NOTE — Progress Notes (Signed)
PROGRESS NOTE  Suzanne Tran  DOB: Oct 17, 1955  PCP: Benita Stabile, MD HQI:[id number]  DOA: 03/19/2020  LOS: 10 days   Chief Complaint  Patient presents with  . Shortness of Breath  . abdominal swelling    Brief narrative: Suzanne Tran is a 64 y.o. female with PMH of morbid obesity, sleep apnea, DM2, HTN, HLD, CKD stage III, CHF, peripheral neuropathy who lives at home alone and uses a walker to get around. Patient presented to the ED on 03/19/2020 with complaint of progressively worsening weight gain, bilateral lower extremity edema progressing to abdominal distention and shortness of breath.  Patient was hypoxic in the ED required supplemental oxygen.  Chest x-ray on admission showed interstitial edema. She was admitted for CHF exacerbation and started on IV diuresis.  Subjective: Patient was seen and examined this morning. Sitting up in chair.  Not in distress.  Remains on low-flow oxygen. Mentions that her cough is not getting better.  Intermittent and violent. Continues to have hematuria. Labs from this morning with creatinine up to 1.68. Cast removed orthopedic surgery. T-max 99.1 last 24 hours.  Assessment/Plan: Acute on chronic systolic CHF Acute hypoxic respiratory failure Essential hypertension -Patient presented grossly volume overloaded.  -Home meds include Lasix, losartan HCTZ. Inconsistent compliance to Lasix at home because of the trips to the bathroom. -Aggressively diuresed with IV Lasix. -Net negative balance 580 ml in last 24 hours and 16.7 L in this admission. -Since her creatinine is trending up, I would hold diuresis at this time. -Continue daily weight, intake output, renal function, electrolytes and blood pressure monitoring. -Recent echo with EF 45 to 50 %.  Repeat echo this admission with slight decline in EF to 40%.   -Not on supplemental oxygen at home. Currently requiring 2 L by nasal cannula.  Wean down as  tolerated.  Hypokalemia/hypomagnesemia -Potassium and magnesium level being monitored and replaced as needed.   Recent Labs  Lab 03/24/20 0556 03/25/20 0709 03/26/20 0738 03/27/20 0802 03/29/20 0607  K 3.8 4.2 4.0 3.9 4.3  MG 1.9 1.9 1.9 2.0  --    Chronic kidney disease stage IIIa  -baseline creatinine less than 1.3.  Mostly stable with diuresis.  Slightly up today.  Continue to monitor.  Recent Labs    03/19/20 1835 03/20/20 1324 03/21/20 0616 03/22/20 0357 03/23/20 0528 03/24/20 0556 03/25/20 0709 03/26/20 0738 03/27/20 0802 03/29/20 0607  CREATININE 1.25* 1.30* 1.34* 1.37* 1.44* 1.26* 1.39* 1.43* 1.44* 1.68*   UTI -Patient demanded a Foley catheter placement because of significant amount of output with diuresis.  She did not like the pubrewick because, 'it has to be removed every time for physical therapy.' -Since Foley catheter was inserted, patient is having hematuria which has gotten worse despite removal of catheter.  Also complains of dysuria -Urinalysis shows many bacteria.  Had a temperature of 99.1 last night.  No WBC count. -At this time, I will start the patient on IV Rocephin for treatment of UTI.  Pending urine culture report. -Continue Pyridium.  Hematuria Chronic normocytic anemia -Hematuria started after catheter placement. -Canister from with dark-colored urine. Hemoglobin stable however between 9 and 10.  -Continue to monitor. -Obtain ultrasound to rule out structural etiology. Recent Labs    10/10/19 1506 03/19/20 1835 03/20/20 4010 03/21/20 0616 03/22/20 0357 03/23/20 0528 03/24/20 0556 03/27/20 0802 03/28/20 0716 03/29/20 0607  HGB 10.6* 10.1* 10.0* 9.6* 9.6* 9.7* 9.8* 9.7* 9.3* 9.7*   Type 2 diabetes mellitus -A1c 6.3 on 9/6  -Currently on  Lantus 18 units daily and premeal sliding scale insulin at home. -Increase to 20 units today. -Diabetes care coordinator consult appreciated. -Continue sliding scale insulin with  Accu-Cheks. Recent Labs  Lab 03/28/20 0756 03/28/20 1205 03/28/20 1639 03/28/20 2117 03/29/20 0728  GLUCAP 203* 267* 242* 224* 185*   History of hyperlipidemia -Not on statin. Lipid panel normal  Morbid obesity - Body mass index is 44 kg/m. Patient has been advised to make an attempt to improve diet and exercise patterns to aid in weight loss.  Obstructive sleep apnea -encourage use of CPAP at night.  Hepatic steatosis Suspect early liver cirrhosis -CT scan of abdomen showed slight nodular capsule contours of liver.   -No history of alcoholism.  Suspect NASH related to morbid obesity.   -Liver enzymes normal.   -Weight loss recommended.    Cholelithiasis -no evidence of gallbladder inflammation on the CT scan, she is asymptomatic from the standpoint.  This can be addressed as an outpatient.  She is asymptomatic with this  Chronic right lower extremity wound  -follows up with podiatrist Dr. Sherlynn Carbon. with wound care in [facility]  -Missed appointment this time because of hospitalization.   -9/14, cast was removed by orthopedic Dr. Romeo Apple.  Surgical boot planned.  Deconditioning -in the setting of severe fluid overload, poor mobility,  -PT recommended SNF.  Nocturnal cough Chronic sinusitis -Some improvement with Tessalon Perles and Claritin.  Mobility: PT eval recommended SNF Code Status:   Code Status: Full Code  Nutritional status: Body mass index is 44 kg/m.     Diet Order            Diet heart healthy/carb modified Room service appropriate? Yes; Fluid consistency: Thin; Fluid restriction: 1500 mL Fluid  Diet effective now                 DVT prophylaxis: enoxaparin (LOVENOX) injection 40 mg Start: 03/20/20 1000   Antimicrobials:  None Fluid: None Consultants: None Family Communication:  None at bedside  Status is: Inpatient  Remains inpatient appropriate because: Unable to discharge today because of persistent hematuria. Dispo: The  patient is from: Home              Anticipated d/c is to: SNF              Anticipated d/c date is: 1 to 2 days              Patient currently is not medically stable to d/c.  Infusions:  . sodium chloride    . cefTRIAXone (ROCEPHIN)  IV      Scheduled Meds: . benzonatate  200 mg Oral TID  . carvedilol  3.125 mg Oral BID WC  . Chlorhexidine Gluconate Cloth  6 each Topical Daily  . enoxaparin (LOVENOX) injection  40 mg Subcutaneous Q24H  . insulin aspart  0-5 Units Subcutaneous QHS  . insulin aspart  0-9 Units Subcutaneous TID WC  . [START ON 03/30/2020] insulin glargine  20 Units Subcutaneous Daily  . loratadine  10 mg Oral QHS  . phenazopyridine  200 mg Oral TID WC  . sodium chloride flush  3 mL Intravenous Q12H    Antimicrobials: Anti-infectives (From admission, onward)   Start     Dose/Rate Route Frequency Ordered Stop   03/29/20 1100  cefTRIAXone (ROCEPHIN) 1 g in sodium chloride 0.9 % 100 mL IVPB        1 g 200 mL/hr over 30 Minutes Intravenous Every 24 hours 03/29/20 1053  PRN meds: sodium chloride, acetaminophen, guaiFENesin-dextromethorphan, hydrALAZINE, ondansetron (ZOFRAN) IV, sodium chloride, sodium chloride flush   Objective: Vitals:   03/28/20 2115 03/29/20 0609  BP: (!) 154/73 (!) 152/70  Pulse: 70 75  Resp: 18 20  Temp: 98.8 F (37.1 C) 99.1 F (37.3 C)  SpO2: 98% 97%    Intake/Output Summary (Last 24 hours) at 03/29/2020 1108 Last data filed at 03/29/2020 0700 Gross per 24 hour  Intake 480 ml  Output 1300 ml  Net -820 ml   Filed Weights   03/25/20 0500 03/27/20 0500 03/28/20 0500  Weight: (!) 142.5 kg (!) 142.3 kg (!) 142.3 kg   Weight change:  Body mass index is 44 kg/m.   Physical Exam: General exam: Appears calm and comfortable.  Not in physical distress. Canister with dark red urine Skin: No rashes, lesions or ulcers. HEENT: Atraumatic, normocephalic, supple neck, no obvious bleeding Lungs: Clear to station bilaterally CVS:  Regular rate and rhythm, no murmur GI/Abd soft, nontender, nondistended, bowel sound present CNS: Alert, awake, oriented x3 Psychiatry: Depressed look Extremities: Chronic bilateral pedal edema improving.  Right leg cast removed today.  Has wrapping on.  Data Review: I have personally reviewed the laboratory data and studies available.  Recent Labs  Lab 03/23/20 0528 03/24/20 0556 03/27/20 0802 03/28/20 0716 03/29/20 0607  WBC 4.1 4.0 5.3 6.2 7.0  NEUTROABS  --   --  2.9 3.8 4.4  HGB 9.7* 9.8* 9.7* 9.3* 9.7*  HCT 31.8* 32.1* 31.9* 31.5* 32.2*  MCV 88.6 88.4 88.9 88.5 88.0  PLT 222 218 198 206 230   Recent Labs  Lab 03/24/20 0556 03/25/20 0709 03/26/20 0738 03/27/20 0802 03/29/20 0607  NA 140 138 139 136 135  K 3.8 4.2 4.0 3.9 4.3  CL 100 99 97* 96* 97*  CO2 28 31 30 31 29   GLUCOSE 194* 196* 202* 202* 189*  BUN 45* 43* 45* 46* 58*  CREATININE 1.26* 1.39* 1.43* 1.44* 1.68*  CALCIUM 9.2 9.0 9.1 8.8* 8.8*  MG 1.9 1.9 1.9 2.0  --     Signed, , MD [facility] 03/29/2020

## 2020-03-29 NOTE — Progress Notes (Signed)
Physical Therapy Treatment Patient Details Name: Suzanne Tran MRN: [id number] DOB: [birth year] Today's Date: 03/29/2020    History of Present Illness 64 y.o. female with medical history significant for diabetes mellitus, chronic kidney disease stage IIIa, and chronic systolic CHF, now presenting with swelling, weight gain, shortness of breath, and difficulty with mobility due to R foot ulcer.    PT Comments    Pt tolerates seated therapeutic exercise with cues for form and motor control. Pt fatigues with x3 STS reps from chair, BUE assisting and min A initially bur min G with following reps. Pt unable to perform additional STS reps due to fatigue. Pt declines ambulation due to fatigued with exercises. Pt with DARCO shoe donned on R foot throughout session and remained on at EOS. Pt on 4.5L O2 with SpO2 94% during session. Pt tolerates remaining up in chair, BLE elevated and call bell in lap at EOS. Patient will benefit from continued physical therapy in hospital and recommendations below to increase strength, balance, endurance for safe ADLs and gait.    Follow Up Recommendations  SNF     Equipment Recommendations  None recommended by PT    Recommendations for Other Services       Precautions / Restrictions Precautions Precautions: Fall Required Braces or Orthoses: Other Brace Other Brace: R DARCO shoe Restrictions Weight Bearing Restrictions: No    Mobility  Bed Mobility  General bed mobility comments: in chair upon arrival  Transfers Overall transfer level: Needs assistance Equipment used: Rolling walker (2 wheeled) Transfers: Sit to/from Stand Sit to Stand: Min assist  General transfer comment: rocking momentum, BUE assisting with min A to power up, progressing to min G with repeated reps  Ambulation/Gait  General Gait Details: pt declines   Stairs             Wheelchair Mobility    Modified Rankin (Stroke Patients Only)       Balance Overall balance  assessment: Needs assistance  Standing balance support: During functional activity;Bilateral upper extremity supported Standing balance-Leahy Scale: Poor Standing balance comment: reliant on RW for support       Cognition Arousal/Alertness: Awake/alert Behavior During Therapy: WFL for tasks assessed/performed Overall Cognitive Status: Within Functional Limits for tasks assessed           Exercises General Exercises - Lower Extremity Quad Sets: AROM;Strengthening;Both;10 reps;Seated Long Arc Quad: AROM;Strengthening;Both;10 reps;Seated Hip Flexion/Marching: AROM;Strengthening;Both;10 reps;Seated Other Exercises Other Exercises: STS, 3 reps with BUE assisting    General Comments General comments (skin integrity, edema, etc.): on 4.5L O2 with SpO2 94% with exercise      Pertinent Vitals/Pain Pain Assessment: No/denies pain    Home Living                      Prior Function            PT Goals (current goals can now be found in the care plan section) Acute Rehab PT Goals Patient Stated Goal: "get out of here" Time For Goal Achievement: 04/05/20 Potential to Achieve Goals: Good Progress towards PT goals: Progressing toward goals    Frequency    Min 3X/week      PT Plan Current plan remains appropriate    Co-evaluation              AM-PAC PT "6 Clicks" Mobility   Outcome Measure  Help needed turning from your back to your side while in a flat bed without using bedrails?: A Little Help  needed moving from lying on your back to sitting on the side of a flat bed without using bedrails?: A Lot Help needed moving to and from a bed to a chair (including a wheelchair)?: A Little Help needed standing up from a chair using your arms (e.g., wheelchair or bedside chair)?: A Little Help needed to walk in hospital room?: A Little Help needed climbing 3-5 steps with a railing? : A Lot 6 Click Score: 16    End of Session Equipment Utilized During Treatment:  Oxygen;Gait belt Activity Tolerance: Patient limited by fatigue;Patient tolerated treatment well Patient left: in chair;with call bell/phone within reach Nurse Communication: Mobility status PT Visit Diagnosis: Unsteadiness on feet (R26.81);Other abnormalities of gait and mobility (R26.89);Repeated falls (R29.6);Muscle weakness (generalized) (M62.81)     Time: 2595-6387 PT Time Calculation (min) (ACUTE ONLY): 21 min  Charges:  $Therapeutic Exercise: 8-22 mins                      [name] PT, DPT 03/29/20, 3:03 PM [phone]

## 2020-03-30 LAB — URINE CULTURE: Culture: >=100000 — AB

## 2020-03-30 LAB — BASIC METABOLIC PANEL
Anion gap: 9 (ref 5–15)
BUN: 65 mg/dL — ABNORMAL HIGH (ref 8–23)
CO2: 29 mmol/L (ref 22–32)
Calcium: 8.6 mg/dL — ABNORMAL LOW (ref 8.9–10.3)
Chloride: 96 mmol/L — ABNORMAL LOW (ref 98–111)
Creatinine, Ser: 1.74 mg/dL — ABNORMAL HIGH (ref 0.44–1.00)
GFR calc Af Amer: 35 mL/min — ABNORMAL LOW (ref >60)
GFR calc non Af Amer: 30 mL/min — ABNORMAL LOW (ref >60)
Glucose, Bld: 174 mg/dL — ABNORMAL HIGH (ref 70–99)
Potassium: 4.2 mmol/L (ref 3.5–5.1)
Sodium: 134 mmol/L — ABNORMAL LOW (ref 135–145)

## 2020-03-30 LAB — GLUCOSE, CAPILLARY
Glucose-Capillary: 171 mg/dL — ABNORMAL HIGH (ref 70–99)
Glucose-Capillary: 175 mg/dL — ABNORMAL HIGH (ref 70–99)
Glucose-Capillary: 214 mg/dL — ABNORMAL HIGH (ref 70–99)
Glucose-Capillary: 206 mg/dL — ABNORMAL HIGH (ref 70–99)

## 2020-03-30 LAB — CBC WITH DIFFERENTIAL/PLATELET
Abs Immature Granulocytes: 0.02 10*3/uL (ref 0.00–0.07)
Basophils Absolute: 0.1 10*3/uL (ref 0.0–0.1)
Basophils Relative: 1 %
Eosinophils Absolute: 0.3 10*3/uL (ref 0.0–0.5)
Eosinophils Relative: 6 %
HCT: 29.9 % — ABNORMAL LOW (ref 36.0–46.0)
Hemoglobin: 9 g/dL — ABNORMAL LOW (ref 12.0–15.0)
Immature Granulocytes: 0 %
Lymphocytes Relative: 32 %
Lymphs Abs: 1.4 10*3/uL (ref 0.7–4.0)
MCH: 26.8 pg (ref 26.0–34.0)
MCHC: 30.1 g/dL (ref 30.0–36.0)
MCV: 89 fL (ref 80.0–100.0)
Monocytes Absolute: 0.7 10*3/uL (ref 0.1–1.0)
Monocytes Relative: 14 %
Neutro Abs: 2.1 10*3/uL (ref 1.7–7.7)
Neutrophils Relative %: 47 %
Platelets: 197 10*3/uL (ref 150–400)
RBC: 3.36 MIL/uL — ABNORMAL LOW (ref 3.87–5.11)
RDW: 14.2 % (ref 11.5–15.5)
WBC: 4.6 10*3/uL (ref 4.0–10.5)
nRBC: 0 % (ref 0.0–0.2)

## 2020-03-30 LAB — SARS CORONAVIRUS 2 BY RT PCR (HOSPITAL ORDER, PERFORMED IN ~~LOC~~ HOSPITAL LAB): SARS Coronavirus 2: NEGATIVE

## 2020-03-30 NOTE — TOC Progression Note (Signed)
Transition of Care [facility]) - Progression Note    Patient Details  Name: Suzanne Tran MRN: [id number] Date of Birth: 1956/01/23  Transition of Care [facility]) CM/SW Contact  Karn Cassis, [place] Phone Number: 03/30/2020, 10:16 AM  Clinical Narrative:  LCSW updated [facility] on pt. Anticipate d/c tomorrow to SNF per MD. Order for rapid COVID test placed by MD.       Expected Discharge Plan: Skilled Nursing Facility Barriers to Discharge: Continued Medical Work up  Expected Discharge Plan and Services Expected Discharge Plan: Skilled Nursing Facility     Post Acute Care Choice: Skilled Nursing Facility Living arrangements for the past 2 months: Apartment                                       Social Determinants of Health (SDOH) Interventions    Readmission Risk Interventions Readmission Risk Prevention Plan 03/20/2020  Post Dischage Appt Complete  Medication Screening Complete  Transportation Screening Complete

## 2020-03-30 NOTE — Progress Notes (Signed)
PROGRESS NOTE  Suzanne Tran  DOB: [birth year]  PCP: Benita Stabile, MD WUJ:[id number]  DOA: 03/19/2020  LOS: 11 days   Chief Complaint  Patient presents with  . Shortness of Breath  . abdominal swelling    Brief narrative: Suzanne Tran is a 64 y.o. female with PMH of morbid obesity, sleep apnea, DM2, HTN, HLD, CKD stage III, CHF, peripheral neuropathy who lives at home alone and uses a walker to get around. Patient presented to the ED on 03/19/2020 with complaint of progressively worsening weight gain, bilateral lower extremity edema progressing to abdominal distention and shortness of breath.  Patient was hypoxic in the ED required supplemental oxygen.  Chest x-ray on admission showed interstitial edema. She was admitted for CHF exacerbation and started on IV diuresis.  Subjective: Patient was seen and examined this morning. Sitting up in chair.  Cough improving with Tessalon Perles. Remains on low-flow oxygen. Continues to have red-colored urine. Hemoglobin trending down.  Assessment/Plan: Acute on chronic systolic CHF Acute hypoxic respiratory failure Essential hypertension -Patient presented grossly volume overloaded.  -She was supposed to be Lasix at home but was inconsistently compliant to eat because of the number of trips to the bathroom. -In the hospital, she has been aggressively diuresed with IV Lasix. -Net negative balance 560 ml in last 24 hours and 17L in this admission. -Lasix on hold since yesterday morning because of creatinine trending up.  Likely will resume tomorrow. -Continue daily weight, intake output, renal function, electrolytes and blood pressure monitoring. -Recent echo with EF 45 to 50 %.  Repeat echo this admission with slight decline in EF to 40%.   -Not on supplemental oxygen at home. Currently requiring 2 L by nasal cannula.  Wean down as tolerated. -Losartan and HCTZ remain on hold.  Hypokalemia/hypomagnesemia -Potassium and magnesium level  being monitored and replaced as needed.   -No replacement required today. Recent Labs  Lab 03/24/20 0556 03/24/20 0556 03/25/20 0709 03/26/20 0738 03/27/20 0802 03/29/20 0607 03/30/20 0622  K 3.8   < > 4.2 4.0 3.9 4.3 4.2  MG 1.9  --  1.9 1.9 2.0  --   --    < > = values in this interval not displayed.   Chronic kidney disease stage IIIa  -baseline creatinine less than 1.3.  Mostly stable with diuresis.  Slightly trending up now.  Continue to monitor. Recent Labs    03/20/20 0632 03/21/20 0616 03/22/20 0357 03/23/20 0528 03/24/20 0556 03/25/20 0709 03/26/20 0738 03/27/20 0802 03/29/20 0607 03/30/20 0622  CREATININE 1.30* 1.34* 1.37* 1.44* 1.26* 1.39* 1.43* 1.44* 1.68* 1.74*   E. coli UTI -Patient demanded a Foley catheter placement because of significant amount of output with diuresis.  She did not like the pubrewick because, 'it has to be removed every time for physical therapy.' -Since Foley catheter was inserted, patient started having dysuria and hematuria which continued even after removal of catheter.   -Low-grade fever without WBC count but urinalysis showed many bacteria.  -Urine culture showed more than 100,000 CFU per mL of E. coli, pansensitive. -Currently on IV Rocephin.  Plan to switch to oral Keflex at discharge. -Stop pyridium today which could be contributing to the red color urine.  Hematuria Chronic normocytic anemia -Hematuria started after catheter placement. -Canister from with dark-colored urine. Hemoglobin gradually trending down.   -Continue to monitor. -9/14, renal ultrasound did not show any evidence of hydronephrosis or structural lesion. Recent Labs    03/19/20 1835 03/20/20 9562 03/21/20 1308 03/22/20  6629 03/23/20 0528 03/24/20 0556 03/27/20 0802 03/28/20 0716 03/29/20 0607 03/30/20 0622  HGB 10.1* 10.0* 9.6* 9.6* 9.7* 9.8* 9.7* 9.3* 9.7* 9.0*   Type 2 diabetes mellitus -A1c 6.3 on 9/6  -Currently on Lantus 20 units daily and  premeal sliding scale insulin at home. -Diabetes care coordinator consult appreciated. -Continue sliding scale insulin with Accu-Cheks. Recent Labs  Lab 03/29/20 1139 03/29/20 1639 03/29/20 2051 03/30/20 0747 03/30/20 1130  GLUCAP 177* 168* 208* 171* 175*   Hepatic steatosis Suspect early liver cirrhosis -CT scan of abdomen showed slight nodular capsule contours of liver.   -No history of alcoholism.  Suspect NASH related to morbid obesity.   -Liver enzymes normal.   -Weight loss recommended.    Cholelithiasis -no evidence of gallbladder inflammation on the CT scan, she is asymptomatic from the standpoint.  This can be addressed as an outpatient.  She is asymptomatic with this.  Chronic right lower extremity wound  -follows up with podiatrist Dr. Sherlynn Carbon. with wound care in [facility]  -Missed appointment this time because of hospitalization.   -9/14, cast was removed by orthopedic Dr. Romeo Apple.  Surgical boot planned.  History of hyperlipidemia -Not on statin. Lipid panel normal  Morbid obesity - Body mass index is 44.22 kg/m. Patient has been advised to make an attempt to improve diet and exercise patterns to aid in weight loss.  Obstructive sleep apnea -encourage use of CPAP at night.  Deconditioning -in the setting of severe fluid overload, poor mobility,  -PT recommended SNF.  Nocturnal cough Chronic sinusitis -Some improvement in cough with Tessalon Perles, Robitussin-DM and Claritin.  Mobility: PT eval recommended SNF Code Status:   Code Status: Full Code  Nutritional status: Body mass index is 44.22 kg/m.     Diet Order            Diet heart healthy/carb modified Room service appropriate? Yes; Fluid consistency: Thin; Fluid restriction: 1500 mL Fluid  Diet effective now                 DVT prophylaxis: Place and maintain sequential compression device Start: 03/30/20 0920   Antimicrobials:  None Fluid: None Consultants: None Family  Communication:  None at bedside  Status is: Inpatient  Remains inpatient appropriate because: Unable to discharge today because of persistent hematuria and drop in hemoglobin. Dispo: The patient is from: Home              Anticipated d/c is to: SNF              Anticipated d/c date is: Likely tomorrow.  Covid swab sent today.              Patient currently is not medically stable to d/c.  Infusions:  . sodium chloride    . cefTRIAXone (ROCEPHIN)  IV 1 g (03/30/20 1105)    Scheduled Meds: . benzonatate  200 mg Oral TID  . carvedilol  3.125 mg Oral BID WC  . Chlorhexidine Gluconate Cloth  6 each Topical Daily  . insulin aspart  0-5 Units Subcutaneous QHS  . insulin aspart  0-9 Units Subcutaneous TID WC  . insulin glargine  20 Units Subcutaneous Daily  . loratadine  10 mg Oral QHS  . sodium chloride flush  3 mL Intravenous Q12H    Antimicrobials: Anti-infectives (From admission, onward)   Start     Dose/Rate Route Frequency Ordered Stop   03/29/20 1100  cefTRIAXone (ROCEPHIN) 1 g in sodium chloride 0.9 % 100  mL IVPB        1 g 200 mL/hr over 30 Minutes Intravenous Every 24 hours 03/29/20 1053        PRN meds: sodium chloride, acetaminophen, guaiFENesin-dextromethorphan, hydrALAZINE, ondansetron (ZOFRAN) IV, sodium chloride, sodium chloride flush   Objective: Vitals:   03/29/20 2050 03/30/20 0525  BP: 140/70 (!) 142/66  Pulse: 63 72  Resp: 18   Temp: 98.9 F (37.2 C) 98.6 F (37 C)  SpO2: 97% 98%    Intake/Output Summary (Last 24 hours) at 03/30/2020 1226 Last data filed at 03/29/2020 1900 Gross per 24 hour  Intake 240 ml  Output --  Net 240 ml   Filed Weights   03/27/20 0500 03/28/20 0500 03/30/20 0500  Weight: (!) 142.3 kg (!) 142.3 kg (!) 143 kg   Weight change:  Body mass index is 44.22 kg/m.   Physical Exam: General exam: Appears calm and comfortable.  Not in physical distress.  Morbidly obese Skin: No rashes, lesions or ulcers. HEENT: Atraumatic,  normocephalic, supple neck, no obvious bleeding Lungs: Clear to auscultation bilaterally CVS: Regular rate and rhythm, no murmur GI/Abd soft, nontender, nondistended, bowel sound present CNS: Alert, awake, oriented x3 Psychiatry: Depressed look Extremities: Chronic bilateral pedal edema improving.  Right leg cast removed .  Has wrapping on.  Data Review: I have personally reviewed the laboratory data and studies available.  Recent Labs  Lab 03/24/20 0556 03/27/20 0802 03/28/20 0716 03/29/20 0607 03/30/20 0622  WBC 4.0 5.3 6.2 7.0 4.6  NEUTROABS  --  2.9 3.8 4.4 2.1  HGB 9.8* 9.7* 9.3* 9.7* 9.0*  HCT 32.1* 31.9* 31.5* 32.2* 29.9*  MCV 88.4 88.9 88.5 88.0 89.0  PLT 218 198 206 230 197   Recent Labs  Lab 03/24/20 0556 03/24/20 0556 03/25/20 0709 03/26/20 0738 03/27/20 0802 03/29/20 0607 03/30/20 0622  NA 140   < > 138 139 136 135 134*  K 3.8   < > 4.2 4.0 3.9 4.3 4.2  CL 100   < > 99 97* 96* 97* 96*  CO2 28   < > 31 30 31 29 29   GLUCOSE 194*   < > 196* 202* 202* 189* 174*  BUN 45*   < > 43* 45* 46* 58* 65*  CREATININE 1.26*   < > 1.39* 1.43* 1.44* 1.68* 1.74*  CALCIUM 9.2   < > 9.0 9.1 8.8* 8.8* 8.6*  MG 1.9  --  1.9 1.9 2.0  --   --    < > = values in this interval not displayed.    Signed, , MD [facility] 03/30/2020

## 2020-03-31 LAB — BASIC METABOLIC PANEL
Anion gap: 9 (ref 5–15)
BUN: 70 mg/dL — ABNORMAL HIGH (ref 8–23)
CO2: 28 mmol/L (ref 22–32)
Calcium: 8.6 mg/dL — ABNORMAL LOW (ref 8.9–10.3)
Chloride: 96 mmol/L — ABNORMAL LOW (ref 98–111)
Creatinine, Ser: 1.72 mg/dL — ABNORMAL HIGH (ref 0.44–1.00)
GFR calc Af Amer: 36 mL/min — ABNORMAL LOW (ref >60)
GFR calc non Af Amer: 31 mL/min — ABNORMAL LOW (ref >60)
Glucose, Bld: 169 mg/dL — ABNORMAL HIGH (ref 70–99)
Potassium: 4.3 mmol/L (ref 3.5–5.1)
Sodium: 133 mmol/L — ABNORMAL LOW (ref 135–145)

## 2020-03-31 LAB — CBC WITH DIFFERENTIAL/PLATELET
Abs Immature Granulocytes: 0.01 10*3/uL (ref 0.00–0.07)
Basophils Absolute: 0 10*3/uL (ref 0.0–0.1)
Basophils Relative: 1 %
Eosinophils Absolute: 0.3 10*3/uL (ref 0.0–0.5)
Eosinophils Relative: 6 %
HCT: 30.2 % — ABNORMAL LOW (ref 36.0–46.0)
Hemoglobin: 9 g/dL — ABNORMAL LOW (ref 12.0–15.0)
Immature Granulocytes: 0 %
Lymphocytes Relative: 31 %
Lymphs Abs: 1.4 10*3/uL (ref 0.7–4.0)
MCH: 26 pg (ref 26.0–34.0)
MCHC: 29.8 g/dL — ABNORMAL LOW (ref 30.0–36.0)
MCV: 87.3 fL (ref 80.0–100.0)
Monocytes Absolute: 0.6 10*3/uL (ref 0.1–1.0)
Monocytes Relative: 12 %
Neutro Abs: 2.3 10*3/uL (ref 1.7–7.7)
Neutrophils Relative %: 50 %
Platelets: 218 10*3/uL (ref 150–400)
RBC: 3.46 MIL/uL — ABNORMAL LOW (ref 3.87–5.11)
RDW: 13.9 % (ref 11.5–15.5)
WBC: 4.6 10*3/uL (ref 4.0–10.5)
nRBC: 0 % (ref 0.0–0.2)

## 2020-03-31 LAB — GLUCOSE, CAPILLARY
Glucose-Capillary: 173 mg/dL — ABNORMAL HIGH (ref 70–99)
Glucose-Capillary: 185 mg/dL — ABNORMAL HIGH (ref 70–99)

## 2020-03-31 MED ORDER — INSULIN ASPART 100 UNIT/ML ~~LOC~~ SOLN: 0.0000 [IU] | Freq: Every day | SUBCUTANEOUS | 11 refills | Status: DC

## 2020-03-31 MED ORDER — INSULIN ASPART 100 UNIT/ML ~~LOC~~ SOLN: 0.0000 [IU] | Freq: Three times a day (TID) | SUBCUTANEOUS | 11 refills | Status: DC

## 2020-03-31 MED ORDER — INSULIN GLARGINE 100 UNIT/ML ~~LOC~~ SOLN: 25.0000 [IU] | Freq: Every day | SUBCUTANEOUS | 11 refills | Status: DC

## 2020-03-31 MED ORDER — BENZONATATE 200 MG PO CAPS: 200.0000 mg | ORAL_CAPSULE | Freq: Three times a day (TID) | ORAL | 0 refills | Status: DC

## 2020-03-31 MED ORDER — SACCHAROMYCES BOULARDII 250 MG PO CAPS: 250.0000 mg | ORAL_CAPSULE | Freq: Two times a day (BID) | ORAL | Status: DC

## 2020-03-31 MED ORDER — GUAIFENESIN-DM 100-10 MG/5ML PO SYRP: 5.0000 mL | ORAL_SOLUTION | ORAL | 0 refills | Status: DC | PRN

## 2020-03-31 MED ORDER — CEPHALEXIN 500 MG PO CAPS: 500.0000 mg | ORAL_CAPSULE | Freq: Three times a day (TID) | ORAL | 0 refills | Status: AC

## 2020-03-31 MED ORDER — FUROSEMIDE 20 MG PO TABS
40.0000 mg | ORAL_TABLET | Freq: Every day | ORAL | Status: DC
Start: 2020-03-31 — End: 2021-10-12

## 2020-03-31 MED ORDER — LEVOCETIRIZINE DIHYDROCHLORIDE 5 MG PO TABS: 5.0000 mg | ORAL_TABLET | Freq: Every day | ORAL | Status: DC

## 2020-03-31 MED ORDER — CARVEDILOL 3.125 MG PO TABS: 3.1250 mg | ORAL_TABLET | Freq: Two times a day (BID) | ORAL | Status: AC

## 2020-03-31 NOTE — Discharge Summary (Signed)
Physician Discharge Summary  Suzanne Tran ZOX:[id number] DOB: [birth year] DOA: 03/19/2020  PCP: Benita Stabile, MD  Admit date: 03/19/2020 Discharge date: 03/31/2020  Admitted From: Home Discharge disposition: SNF   Code Status: Full Code  Diet Recommendation: Cardiac diet  Discharge Diagnosis:   Principal Problem:   Acute on chronic systolic (congestive) heart failure (HCC) Active Problems:   CKD (chronic kidney disease) stage 3, GFR 30-59 ml/min   Type 2 diabetes mellitus (HCC)  History of Present Illness / Brief narrative:  Suzanne Tran is a 64 y.o. female with PMH of morbid obesity, sleep apnea, DM2, HTN, HLD, CKD stage III, CHF, peripheral neuropathy who lives at home alone and uses a walker to get around. Patient presented to the ED on 03/19/2020 with complaint of progressively worsening weight gain, bilateral lower extremity edema progressing to abdominal distention and shortness of breath.  Patient was hypoxic in the ED required supplemental oxygen.  Chest x-ray on admission showed interstitial edema. She was admitted for CHF exacerbation and started on IV diuresis.  Subjective:  Seen and examined this morning. Sitting up in chair.  Not in distress.  Urine color clearing up.  Hospital Course:  Acute on chronic systolic CHF Acute hypoxic respiratory failure Essential hypertension -Patient presented grossly volume overloaded.  -She was supposed to be Lasix at home but was inconsistently compliant to eat because of the number of trips to the bathroom. -In the hospital, she has been aggressively diuresed with IV Lasix. -Net negative balance 1.2 L ml in last 24 hours and 18.5 L in this admission. -She will start Lasix 40 mg oral daily post discharge.  Prior to admission, she was also on losartan and HCTZ which have been stopped.  Heart rate remains in 60s with Coreg 3.125 mg twice daily. -Recent echo with EF 45 to 50 %.  Repeat echo this admission with slight decline in EF to  40%.   -Not on supplemental oxygen at home. Currently requiring 2 L by nasal cannula intermittently, mostly at night.  Continue the same.  Hypokalemia/hypomagnesemia -Potassium and magnesium level were monitored and replaced in the hospital while being diuresed.   Recent Labs  Lab 03/25/20 0709 03/25/20 0709 03/26/20 0738 03/27/20 0802 03/29/20 0607 03/30/20 0622 03/31/20 0250  K 4.2   < > 4.0 3.9 4.3 4.2 4.3  MG 1.9  --  1.9 2.0  --   --   --    < > = values in this interval not displayed.   Acute kidney injury on chronic kidney disease stage IIIa -baseline creatinine less than 1.3.  Creatinine gradually trended up with diuresis but has plateaued now at 1.74.  Most likely new normal. Recent Labs    03/21/20 0616 03/22/20 0357 03/23/20 0528 03/24/20 0556 03/25/20 0709 03/26/20 0738 03/27/20 0802 03/29/20 0607 03/30/20 0622 03/31/20 0250  BUN 43* 45* 48* 45* 43* 45* 46* 58* 65* 70*  CREATININE 1.34* 1.37* 1.44* 1.26* 1.39* 1.43* 1.44* 1.68* 1.74* 1.72*   E. coli UTI -Patient demanded a Foley catheter placement because of significant amount of output with diuresis.  She did not like the pubrewick because, 'it has to be removed every time for physical therapy.' -Since Foley catheter was inserted, patient started having dysuria and hematuria which continued even after removal of catheter.   -Low-grade fever without WBC count but urinalysis showed many bacteria.  -Urine culture showed more than 100,000 CFU per mL of E. coli, pansensitive. -Treated with IV Rocephin.  5 more days  of oral Keflex post discharge..  Hematuria Chronic normocytic anemia -Hematuria started after catheter placement. -Probably made worse by UTI and Lovenox used for DVT prophylaxis. -Hematuria has now stopped.  Hemoglobin level stable between 9 and 10. -9/14, renal ultrasound did not show any evidence of hydronephrosis or structural lesion. Recent Labs    03/20/20 0321 03/21/20 0616  03/22/20 0357 03/23/20 0528 03/24/20 0556 03/27/20 0802 03/28/20 0716 03/29/20 0607 03/30/20 0622 03/31/20 0250  HGB 10.0* 9.6* 9.6* 9.7* 9.8* 9.7* 9.3* 9.7* 9.0* 9.0*   Type 2 diabetes mellitus -A1c 6.3 on 9/6  -Was on Lantus 8 units daily at home with scheduled Premeal insulin. -Blood sugar level monitored and insulin dose adjusted. -Plan to discharge on Lantus 25 units daily with premeal sliding scale insulin. -Diabetes care coordinator consult appreciated. Recent Labs  Lab 03/30/20 0747 03/30/20 1130 03/30/20 1605 03/30/20 2208 03/31/20 0817  GLUCAP 171* 175* 214* 206* 173*   Hepatic steatosis Suspect early liver cirrhosis -CT scan of abdomen showed slight nodular capsule contours of liver.   -No history of alcoholism.  Suspect NASH related to morbid obesity.   -Liver enzymes normal.   -Weight loss recommended.    Cholelithiasis -no evidence of gallbladder inflammation on the CT scan, she is asymptomatic from the standpoint. This can be addressed as an outpatient. She is asymptomatic with this.  Chronic right lower extremity wound -follows up with podiatrist Dr. Sherlynn Carbon. with wound care in [facility]  -Missed appointment this time because of hospitalization.   -9/14, cast was removed by orthopedic Dr. Romeo Apple.  Surgical boot applied -Follow-up with orthopedics as an outpatient  History of hyperlipidemia -Not on statin. Lipid panel normal  Morbid obesity - Body mass index is 44.22 kg/m. Patient has been advised to make an attempt to improve diet and exercise patterns to aid in weight loss.  Obstructive sleep apnea -does not use CPAP.  Requiring nightly oxygen.  Deconditioning -in the setting of severe fluid overload, poor mobility,  -PT recommended SNF.  Nocturnal cough Chronic sinusitis -improvement in cough with Tessalon Perles, Robitussin-DM and Xyzal.  Wound care: Wound / Incision (Open or Dehisced) 07/18/18 Diabetic ulcer Foot Right  plantar aspect  (Active)  Date First Assessed/Time First Assessed: 07/18/18 0827   Wound Type: Diabetic ulcer  Location: Foot  Location Orientation: Right  Wound Description (Comments): plantar aspect   Present on Admission: Yes    Assessments 07/18/2018 12:09 PM 04/14/2019  6:03 PM  Dressing Type Gauze (Comment) Silver hydrofiber  Dressing Changed Changed Changed  Dressing Status Old drainage Old drainage  Dressing Change Frequency PRN PRN  Site / Wound Assessment Red Pink;Pale  % Wound base Red or Granulating 95% 100%  % Wound base Yellow/Fibrinous Exudate 5% --  Peri-wound Assessment Edema;Other (Comment) Erythema (non-blanchable)  Wound Length (cm) 3.2 cm 1.6 cm  Wound Width (cm) 1.9 cm 1.6 cm  Wound Depth (cm) 0.3 cm 1 cm  Wound Volume (cm^3) 1.82 cm^3 2.56 cm^3  Wound Surface Area (cm^2) 6.08 cm^2 2.56 cm^2  Margins Epibole (rolled edges) Epibole (rolled edges)  Closure -- None  Drainage Amount Moderate Moderate  Drainage Description Serous Serosanguineous  Treatment Cleansed;Debridement (Selective) Cleansed;Debridement (Selective)     No Linked orders to display    Discharge Exam:   Vitals:   03/30/20 1941 03/30/20 2207 03/31/20 0500 03/31/20 0522  BP:  (!) 145/52  (!) 151/68  Pulse:  73    Resp:  20  20  Temp:  98.8 F (37.1 C)  98.6 F (37 C)  TempSrc:    Oral  SpO2: (!) 88% (!) 87%  97%  Weight:   (!) 144 kg     Body mass index is 44.53 kg/m.  General exam: Appears calm and comfortable.  Not in physical distress Skin: No rashes, lesions or ulcers. HEENT: Atraumatic, normocephalic, supple neck, no obvious bleeding Lungs: Clear to auscultation bilaterally CVS: Regular rate and rhythm, no murmur GI/Abd soft, nontender, nondistended, bowel sound present CNS: Alert, awake monitor x3 Psychiatry: Mood appropriate Extremities: Chronic bilateral lymphedema.  Right leg on surgical boot.  Follow ups:   Discharge Instructions    AMB referral to CHF clinic   Complete  by: As directed    Avoid straining   Complete by: As directed    Diet - low sodium heart healthy   Complete by: As directed    Diet - low sodium heart healthy   Complete by: As directed    Diet Carb Modified   Complete by: As directed    Heart Failure patients record your daily weight using the same scale at the same time of day   Complete by: As directed    Increase activity slowly   Complete by: As directed    Increase activity slowly   Complete by: As directed    STOP any activity that causes chest pain, shortness of breath, dizziness, sweating, or exessive weakness   Complete by: As directed       Contact information for follow-up providers    Benita Stabile, MD Follow up.   Specialty: Internal Medicine Contact information: [address] [place] [facility] [address] [phone]        Jonelle Sidle, MD .   Specialty: Cardiology Contact information: [address] [place] [address] [phone]        Delman Kitten, DPM Follow up.   Specialty: Podiatry Contact information: [phone] [facility] [address] [place] [address] [phone]        Vickki Hearing, MD Follow up.   Specialties: Orthopedic Surgery, Radiology Contact information: [address] [place] [address] [phone]            Contact information for after-discharge care    Destination    [facility] .   Service: Skilled Nursing Contact information: [address] [place] [address] [phone]                  Recommendations for Outpatient Follow-Up:   1. Follow-up with PCP as an outpatient 2. Follow-up with orthopedics as an outpatient  Discharge Instructions:  Follow with Primary MD Benita Stabile, MD in 7 days   Get CBC/BMP checked in next visit within 1 week by PCP or SNF MD ( we routinely change or add medications that can affect your baseline labs and fluid status,  therefore we recommend that you get the mentioned basic workup next visit with your PCP, your PCP may decide not to get them or add new tests based on their clinical decision)  On your next visit with your PCP, please Get Medicines reviewed and adjusted.  Please request your PCP  to go over all Hospital Tests and Procedure/Radiological results at the follow up, please get all Hospital records sent to your Prim MD by signing hospital release before you go home.  Activity: As tolerated with Full fall precautions use walker/cane & assistance as needed  For Heart failure patients -  Check your Weight same time everyday, if you gain over 2 pounds, or you develop in leg swelling, experience more shortness of breath or chest pain, call your Primary MD immediately. Follow Cardiac Low Salt Diet and 1.5 lit/day fluid restriction.  If you have smoked or chewed Tobacco in the last 2 yrs please stop smoking, stop any regular Alcohol  and or any Recreational drug use.  If you experience worsening of your admission symptoms, develop shortness of breath, life threatening emergency, suicidal or homicidal thoughts you must seek medical attention immediately by calling 911 or calling your MD immediately  if symptoms less severe.  You Must read complete instructions/literature along with all the possible adverse reactions/side effects for all the Medicines you take and that have been prescribed to you. Take any new Medicines after you have completely understood and accpet all the possible adverse reactions/side effects.   Do not drive, operate heavy machinery, perform activities at heights, swimming or participation in water activities or provide baby sitting services if your were admitted for syncope or siezures until you have seen by Primary MD or a Neurologist and advised to do so again.  Do not drive when taking Pain medications.  Do not take more than prescribed Pain, Sleep and Anxiety Medications  Wear Seat  belts while driving.   Please note You were cared for by a hospitalist during your hospital stay. If you have any questions about your discharge medications or the care you received while you were in the hospital after you are discharged, you can call the unit and asked to speak with the hospitalist on call if the hospitalist that took care of you is not available. Once you are discharged, your primary care physician will handle any further medical issues. Please note that NO REFILLS for any discharge medications will be authorized once you are discharged, as it is imperative that you return to your primary care physician (or establish a relationship with a primary care physician if you do not have one) for your aftercare needs so that they can reassess your need for medications and monitor your lab values.    Allergies as of 03/31/2020      Reactions   Codeine    Rash and oral swelling   Dilantin [phenytoin] Other (See Comments)   seizure      Medication List    STOP taking these medications   insulin lispro 100 UNIT/ML KwikPen Commonly known as: HUMALOG   insulin regular 100 units/mL injection Commonly known as: NOVOLIN R   Lantus SoloStar 100 UNIT/ML Solostar Pen Generic drug: insulin glargine Replaced by: insulin glargine 100 UNIT/ML injection   losartan-hydrochlorothiazide 50-12.5 MG tablet Commonly known as: HYZAAR     TAKE these medications   albuterol 108 (90 Base) MCG/ACT inhaler Commonly known as: VENTOLIN HFA Inhale 2 puffs into the lungs every 6 (six) hours as needed.   ascorbic acid 250 MG tablet Commonly known as: VITAMIN C Take by mouth.   aspirin EC 81 MG tablet Take 1 tablet (81 mg total) by mouth daily. Swallow whole.   benzonatate 200 MG capsule Commonly known as: TESSALON Take 1 capsule (200 mg total) by mouth 3 (three) times daily.   carvedilol 3.125 MG tablet Commonly known as: COREG Take 1 tablet (3.125 mg total) by mouth 2 (two) times daily  with a meal.   cephALEXin 500 MG capsule Commonly known as: KEFLEX Take 1 capsule (500 mg total) by mouth 3 (three) times daily for 5  days.   furosemide 20 MG tablet Commonly known as: LASIX Take 2 tablets (40 mg total) by mouth daily. What changed: how much to take   guaiFENesin-dextromethorphan 100-10 MG/5ML syrup Commonly known as: ROBITUSSIN DM Take 5 mLs by mouth every 4 (four) hours as needed for cough.   insulin aspart 100 UNIT/ML injection Commonly known as: novoLOG Inject 0-5 Units into the skin at bedtime.   insulin aspart 100 UNIT/ML injection Commonly known as: novoLOG Inject 0-9 Units into the skin 3 (three) times daily with meals.   insulin glargine 100 UNIT/ML injection Commonly known as: LANTUS Inject 0.25 mLs (25 Units total) into the skin daily. Start taking on: April 01, 2020 Replaces: Lantus SoloStar 100 UNIT/ML Solostar Pen   levocetirizine 5 MG tablet Commonly known as: XYZAL Take 1 tablet (5 mg total) by mouth at bedtime.   potassium chloride 10 MEQ tablet Commonly known as: KLOR-CON Take 10 mEq by mouth daily.   saccharomyces boulardii 250 MG capsule Commonly known as: FLORASTOR Take 1 capsule (250 mg total) by mouth 2 (two) times daily.   Vitamin B-Complex Tabs Take by mouth.       Time coordinating discharge: 35 minutes  The results of significant diagnostics from this hospitalization (including imaging, microbiology, ancillary and laboratory) are listed below for reference.    Procedures and Diagnostic Studies:   CT ABDOMEN PELVIS WO CONTRAST  Result Date: 03/19/2020 CLINICAL DATA:  Abdominal distension. Patient reports abdominal swelling for 4 days and pitting edema in his feet. Shortness of breath. EXAM: CT ABDOMEN AND PELVIS WITHOUT CONTRAST TECHNIQUE: Multidetector CT imaging of the abdomen and pelvis was performed following the standard protocol without IV contrast. COMPARISON:  None. FINDINGS: Lower chest: Small right greater  than left pleural effusions and adjacent atelectasis. Mild cardiomegaly. Coronary artery calcifications. Mitral annulus calcifications. Hepatobiliary: Mildly enlarged liver spanning 19.5 cm cranial caudal. There is slight nodular capsular contours. No evidence of focal hepatic lesion on noncontrast exam. Multiple gallstones within physiologically distended gallbladder. Common bile duct is not well-defined on the current exam, no evidence of biliary dilatation. Pancreas: Mild atrophy.  No ductal dilatation or inflammation. Spleen: Mildly enlarged spanning 12.6 x 12.9 x 6.7 cm (volume = 570 cm^3). No evidence of focal abnormality. There is a small splenule at the hilum. Adrenals/Urinary Tract: No left adrenal nodule. Right adrenal gland is grossly normal but difficult to accurately delineate. There is dilatation of the left renal pelvis without definite calyceal dilatation or ureteral dilatation, likely extrarenal pelvis configuration. Left kidney demonstrates multifocal areas of cortical scarring with lobulated contours. There is left perinephric edema. There is an exophytic lesion measuring water density from the lower left kidney measuring 2.1 cm likely cyst. An adjacent 11 mm nodule in the lower left kidney is too small to accurately characterize. Right kidney is displaced inferiorly in the abdomen. There is moderate right perinephric edema. No right hydronephrosis. No visualized urolithiasis. Urinary bladder is unremarkable. Stomach/Bowel: Bowel evaluation is limited in the absence of enteric contrast, motion artifact, and soft tissue attenuation from habitus. Ingested material distends the stomach. Normal positioning of the duodenum and ligament of Treitz. There is no small bowel obstruction. Bowel loops in the pelvis are difficult to delineate from adjacent ascites, no obvious bowel inflammation. Appendix tentatively but not definitively visualized. No appendicitis. Small to moderate volume of colonic stool. No  colonic inflammation. Vascular/Lymphatic: Aortic atherosclerosis. No aortic aneurysm. Prominent retroperitoneal nodes measuring up to 8 mm, likely reactive but nonspecific. Reproductive: Uterus partially  obscured by adjacent ascites and soft tissue attenuation. No gross adnexal mass. Other: Small to moderate volume intra-abdominal ascites with fluid in the right left upper quadrant, left pericolic gutter, and tracking into the pelvis. There is mild to moderate mesenteric edema. Severe subcutaneous edema and skin thickening. Edema appears confluent in the flanks. No free intra-abdominal air. Musculoskeletal: Mild superior endplate compression fracture of L4 versus Schmorl's node, appears chronic. There is multilevel degenerative change in the spine. Cortical irregularity about the anterior right iliac bone may be sequela of remote injury or postsurgical. IMPRESSION: 1. Mild hepatosplenomegaly. Slight nodular capsular contours, can be seen with cirrhosis. Recommend correlation for cirrhosis risk factors. 2. Significant third spacing with severe subcutaneous edema. Small to moderate abdominopelvic ascites. 3. Small right greater than left pleural effusions and adjacent atelectasis. 4. Cholelithiasis without evidence of gallbladder inflammation. 5. Left renal cortical scarring. Probable left renal cyst. Additional nodule in the lower left kidney is too small to accurately characterize. Aortic Atherosclerosis (ICD10-I70.0). Electronically Signed   By: Narda Rutherford M.D.   On: 03/19/2020 18:48   DG Chest Port 1 View  Result Date: 03/19/2020 CLINICAL DATA:  Shortness of breath.  Abdominal swelling for 4 days. EXAM: PORTABLE CHEST 1 VIEW COMPARISON:  Radiograph and chest CT 10/10/2019 FINDINGS: Similar cardiomegaly. Unchanged mediastinal contours with aortic atherosclerosis. Small bilateral pleural effusions. There is vascular congestion with mild interstitial opacities suspicious for edema. No confluent airspace  disease. No pneumothorax. No acute osseous abnormalities are seen IMPRESSION: Cardiomegaly with small pleural effusions, vascular congestion and mild interstitial opacities suspicious for pulmonary edema. Overall findings consistent with CHF. Electronically Signed   By: Narda Rutherford M.D.   On: 03/19/2020 18:51   Labs:   Basic Metabolic Panel: Recent Labs  Lab 03/25/20 0709 03/25/20 0709 03/26/20 0738 03/26/20 0738 03/27/20 0802 03/27/20 0802 03/29/20 0607 03/29/20 0607 03/30/20 0622 03/31/20 0250  NA 138   < > 139  --  136  --  135  --  134* 133*  K 4.2   < > 4.0   < > 3.9   < > 4.3   < > 4.2 4.3  CL 99   < > 97*  --  96*  --  97*  --  96* 96*  CO2 31   < > 30  --  31  --  29  --  29 28  GLUCOSE 196*   < > 202*  --  202*  --  189*  --  174* 169*  BUN 43*   < > 45*  --  46*  --  58*  --  65* 70*  CREATININE 1.39*   < > 1.43*  --  1.44*  --  1.68*  --  1.74* 1.72*  CALCIUM 9.0   < > 9.1  --  8.8*  --  8.8*  --  8.6* 8.6*  MG 1.9  --  1.9  --  2.0  --   --   --   --   --    < > = values in this interval not displayed.   GFR Estimated Creatinine Clearance: 51.4 mL/min (A) (by C-G formula based on SCr of 1.72 mg/dL (H)). Liver Function Tests: No results for input(s): AST, ALT, ALKPHOS, BILITOT, PROT, ALBUMIN in the last 168 hours. No results for input(s): LIPASE, AMYLASE in the last 168 hours. No results for input(s): AMMONIA in the last 168 hours. Coagulation profile No results for input(s): INR, PROTIME in the last  168 hours.  CBC: Recent Labs  Lab 03/27/20 0802 03/28/20 0716 03/29/20 0607 03/30/20 0622 03/31/20 0250  WBC 5.3 6.2 7.0 4.6 4.6  NEUTROABS 2.9 3.8 4.4 2.1 2.3  HGB 9.7* 9.3* 9.7* 9.0* 9.0*  HCT 31.9* 31.5* 32.2* 29.9* 30.2*  MCV 88.9 88.5 88.0 89.0 87.3  PLT 198 206 230 197 218   Cardiac Enzymes: No results for input(s): CKTOTAL, CKMB, CKMBINDEX, TROPONINI in the last 168 hours. BNP: Invalid input(s): POCBNP CBG: Recent Labs  Lab 03/30/20 0747  03/30/20 1130 03/30/20 1605 03/30/20 2208 03/31/20 0817  GLUCAP 171* 175* 214* 206* 173*   D-Dimer No results for input(s): DDIMER in the last 72 hours. Hgb A1c No results for input(s): HGBA1C in the last 72 hours. Lipid Profile No results for input(s): CHOL, HDL, LDLCALC, TRIG, CHOLHDL, LDLDIRECT in the last 72 hours. Thyroid function studies No results for input(s): TSH, T4TOTAL, T3FREE, THYROIDAB in the last 72 hours.  Invalid input(s): FREET3 Anemia work up No results for input(s): VITAMINB12, FOLATE, FERRITIN, TIBC, IRON, RETICCTPCT in the last 72 hours. Microbiology Recent Results (from the past 240 hour(s))  SARS Coronavirus 2 by RT PCR (hospital order, performed in [facility] hospital lab) Nasopharyngeal Nasopharyngeal Swab     Status: None   Collection Time: 03/27/20  9:28 AM   Specimen: Nasopharyngeal Swab  Result Value Ref Range Status   SARS Coronavirus 2 NEGATIVE NEGATIVE Final    Comment: (NOTE) SARS-CoV-2 target nucleic acids are NOT DETECTED.  The SARS-CoV-2 RNA is generally detectable in upper and lower respiratory specimens during the acute phase of infection. The lowest concentration of SARS-CoV-2 viral copies this assay can detect is 250 copies / mL. A negative result does not preclude SARS-CoV-2 infection and should not be used as the sole basis for treatment or other patient management decisions.  A negative result may occur with improper specimen collection / handling, submission of specimen other than nasopharyngeal swab, presence of viral mutation(s) within the areas targeted by this assay, and inadequate number of viral copies (<250 copies / mL). A negative result must be combined with clinical observations, patient history, and epidemiological information.  Fact Sheet for Patients:   BoilerBrush.com.cy  Fact Sheet for Healthcare Providers: https://pope.com/  This test is not yet approved or   cleared by the [place] FDA and has been authorized for detection and/or diagnosis of SARS-CoV-2 by FDA under an Emergency Use Authorization (EUA).  This EUA will remain in effect (meaning this test can be used) for the duration of the COVID-19 declaration under Section 564(b)(1) of the Act, 21 U.S.C. section 360bbb-3(b)(1), unless the authorization is terminated or revoked sooner.  Performed at [facility], [address] [place], [place] [address]   Culture, Urine     Status: Abnormal   Collection Time: 03/27/20  1:30 PM   Specimen: Urine, Random  Result Value Ref Range Status   Specimen Description URINE, RANDOM  Final   Special Requests NONE  Final   Culture >=100,000 COLONIES/mL ESCHERICHIA COLI (A)  Final   Report Status 03/30/2020 FINAL  Final   Organism ID, Bacteria ESCHERICHIA COLI (A)  Final      Susceptibility   Escherichia coli - MIC*    AMPICILLIN <=2 SENSITIVE Sensitive     CEFAZOLIN <=4 SENSITIVE Sensitive     CEFTRIAXONE <=0.25 SENSITIVE Sensitive     CIPROFLOXACIN <=0.25 SENSITIVE Sensitive     GENTAMICIN <=1 SENSITIVE Sensitive     IMIPENEM <=0.25 SENSITIVE Sensitive  NITROFURANTOIN <=16 SENSITIVE Sensitive     TRIMETH/SULFA <=20 SENSITIVE Sensitive     AMPICILLIN/SULBACTAM <=2 SENSITIVE Sensitive     PIP/TAZO <=4 SENSITIVE Sensitive     * >=100,000 COLONIES/mL ESCHERICHIA COLI  SARS Coronavirus 2 by RT PCR (hospital order, performed in [facility] hospital lab) Nasopharyngeal Nasopharyngeal Swab     Status: None   Collection Time: 03/30/20  9:23 AM   Specimen: Nasopharyngeal Swab  Result Value Ref Range Status   SARS Coronavirus 2 NEGATIVE NEGATIVE Final    Comment: (NOTE) SARS-CoV-2 target nucleic acids are NOT DETECTED.  The SARS-CoV-2 RNA is generally detectable in upper and lower respiratory specimens during the acute phase of infection. The lowest concentration of SARS-CoV-2 viral copies this assay can detect is 250 copies / mL. A negative  result does not preclude SARS-CoV-2 infection and should not be used as the sole basis for treatment or other patient management decisions.  A negative result may occur with improper specimen collection / handling, submission of specimen other than nasopharyngeal swab, presence of viral mutation(s) within the areas targeted by this assay, and inadequate number of viral copies (<250 copies / mL). A negative result must be combined with clinical observations, patient history, and epidemiological information.  Fact Sheet for Patients:   BoilerBrush.com.cyhttps://www.fda.gov/media/136312/download  Fact Sheet for Healthcare Providers: https://pope.com/https://www.fda.gov/media/136313/download  This test is not yet approved or  cleared by the [place] FDA and has been authorized for detection and/or diagnosis of SARS-CoV-2 by FDA under an Emergency Use Authorization (EUA).  This EUA will remain in effect (meaning this test can be used) for the duration of the COVID-19 declaration under Section 564(b)(1) of the Act, 21 U.S.C. section 360bbb-3(b)(1), unless the authorization is terminated or revoked sooner.  Performed at [facility], [address] [place], [place] [address]      Signed: Lorin GlassBinaya Arzella Rehmann  [facility] 03/31/2020, 10:28 AM

## 2020-03-31 NOTE — Progress Notes (Signed)
Physical Therapy Treatment Patient Details Name: Suzanne Tran MRN: [id number] DOB: 12/20/1955 Today's Date: 03/31/2020    History of Present Illness 64 y.o. female with medical history significant for diabetes mellitus, chronic kidney disease stage IIIa, and chronic systolic CHF, now presenting with swelling, weight gain, shortness of breath, and difficulty with mobility due to R foot ulcer.    PT Comments    Pt open to R ankle and LE strengthening exercises. Pt requires active assist with seated abd/add in long sitting, therapist assisting to decrease friction and increase AROM while strengthening R hip muscles. Therapist assisted to prevent LE compensation with R ankle ROM exercises, cued pt to perform in pain free ROM and pt reports compliance. Pt skeptical of DARCO shoe support, questioning if weight-bearing will negatively impact plantar surface wound, and ultimately declines ambulation despite education from therapist. Pt on RA upon arrival with SpO2 89%, returned 2L and SpO2 94% with exercises and no labored breathing noted. Patient will benefit from continued physical therapy in hospital and recommendations below to increase strength, balance, endurance for safe ADLs and gait.    Follow Up Recommendations  SNF     Equipment Recommendations  None recommended by PT    Recommendations for Other Services       Precautions / Restrictions Precautions Precautions: Fall Required Braces or Orthoses: Other Brace Other Brace: R DARCO shoe Restrictions Weight Bearing Restrictions: No    Mobility  Bed Mobility  General bed mobility comments: in chair upon arrival  Transfers  General transfer comment: pt declines  Ambulation/Gait  General Gait Details: pt declines   Stairs             Wheelchair Mobility    Modified Rankin (Stroke Patients Only)       Balance         Cognition Arousal/Alertness: Awake/alert Behavior During Therapy: WFL for tasks  assessed/performed Overall Cognitive Status: Within Functional Limits for tasks assessed       Exercises General Exercises - Lower Extremity Quad Sets: AROM;Strengthening;Right;Seated;20 reps Hip ABduction/ADduction: AAROM;Strengthening;Right;Seated;20 reps Other Exercises Other Exercises: ankle AROM (plantarflexion/dorsiflexion/inversion/eversion), R foot long sitting, 20 reps each direction, pain free ROM    General Comments        Pertinent Vitals/Pain Pain Assessment: No/denies pain    Home Living                      Prior Function            PT Goals (current goals can now be found in the care plan section) Acute Rehab PT Goals Patient Stated Goal: "get out of here" Time For Goal Achievement: 04/05/20 Potential to Achieve Goals: Good Progress towards PT goals: Progressing toward goals    Frequency    Min 3X/week      PT Plan Current plan remains appropriate    Co-evaluation              AM-PAC PT "6 Clicks" Mobility   Outcome Measure  Help needed turning from your back to your side while in a flat bed without using bedrails?: A Little Help needed moving from lying on your back to sitting on the side of a flat bed without using bedrails?: A Lot Help needed moving to and from a bed to a chair (including a wheelchair)?: A Little Help needed standing up from a chair using your arms (e.g., wheelchair or bedside chair)?: A Little Help needed to walk in hospital room?: A Little Help  needed climbing 3-5 steps with a railing? : A Lot 6 Click Score: 16    End of Session Equipment Utilized During Treatment: Oxygen Activity Tolerance: Patient tolerated treatment well Patient left: in chair;with call bell/phone within reach Nurse Communication: Mobility status PT Visit Diagnosis: Unsteadiness on feet (R26.81);Other abnormalities of gait and mobility (R26.89);Repeated falls (R29.6);Muscle weakness (generalized) (M62.81)     Time: 0086-7619 PT  Time Calculation (min) (ACUTE ONLY): 21 min  Charges:  $Therapeutic Exercise: 8-22 mins                      Domenick Bookbinder PT, DPT 03/31/20, 12:44 PM [phone]

## 2020-03-31 NOTE — TOC Transition Note (Signed)
Transition of Care [facility]) - CM/SW Discharge Note   Patient Details  Name: Suzanne Tran MRN: [id number] Date of Birth: January 23, 1956  Transition of Care [facility]) CM/SW Contact:  Barry Brunner, LCSW Phone Number: 03/31/2020, 12:10 PM   Clinical Narrative:    CSW contact [facility] to verify that they would be able to take patient on 03/31/2020. Debbie with [facility] agreeable to take patient on 03/31/2020. CSW called for EMS transport. CSW also notified patient's brother of discharge. Nurse to call report.   Final next level of care: Skilled Nursing Facility Barriers to Discharge: Barriers Resolved   Patient Goals and CMS Choice Patient states their goals for this hospitalization and ongoing recovery are:: Rehab with SNF CMS Medicare.gov Compare Post Acute Care list provided to:: Patient Choice offered to / list presented to : Patient  Discharge Placement                Patient to be transferred to facility by: [facility] Name of family member notified: Lajuan Lines Patient and family notified of of transfer: 03/31/20  Discharge Plan and Services     Post Acute Care Choice: Skilled Nursing Facility                               Social Determinants of Health (SDOH) Interventions     Readmission Risk Interventions Readmission Risk Prevention Plan 03/31/2020 03/20/2020  Post Dischage Appt - Complete  Medication Screening - Complete  Transportation Screening Complete Complete  PCP or Specialist Appt within 5-7 Days Complete -  Home Care Screening Complete -  Medication Review (RN CM) Complete -

## 2020-04-22 ENCOUNTER — Telehealth: Payer: Self-pay | Admitting: Cardiology

## 2020-04-22 NOTE — Telephone Encounter (Signed)
Spoke with pt who is requesting to speak with Production designer, theatre/television/film. Call transferred to [facility].

## 2020-04-22 NOTE — Telephone Encounter (Signed)
New message    Patient left message on vm at [phone] - Patient was very upset she said that she has never been told that she was diagnosed with CHF.  She said that would have been inportant information to share with her and [facility].  She has gained 80lbs in about 2 months and has been retaining fluids Patient would like a phone call asap

## 2020-05-10 NOTE — Progress Notes (Deleted)
Cardiology Office Note    Date:  05/10/2020   ID:  Suzanne Tran, DOB 10-10-1955, MRN [id number]  PCP:  Benita Stabile, MD  Cardiologist: Nona Dell, MD EPS: None  No chief complaint on file.   History of Present Illness:  Suzanne Tran is a 64 y.o. female with history of hypertension, HLD, DM type II, CKD stage III, morbid obesity, OSA cardiomyopathy LVEF 45 to 50% by echo calculated lower by Myoview.  Perfusion imaging showed breast attenuation, somewhat variable and cannot completely exclude anterior lateral ischemia.  Dr. Diona Browner discussed the possibility of diagnostic cardiac catheterization but the patient has declined.  He also offered additional medical therapy options such as adding low-dose Coreg but she declined this as well at office visit 02/29/2020.  Patient was discharged from the hospital 03/31/2020 after being treated for volume overload and CHF.  She diuresed 18.5 L.  Losartan and HCTZ were stopped because of AKI and she was sent home on Lasix 40 mg daily and Coreg 3.125 mg twice daily.  Repeat limited echo in the hospital showed decline in EF to 40% with global HK.  She also had suspected NASH related to morbid obesity. She was not seen by cardiology that admission.    Past Medical History:  Diagnosis Date  . Charcot ankle, right   . CKD (chronic kidney disease) stage 3, GFR 30-59 ml/min   . Diabetic Charcot foot (HCC)   . Essential hypertension   . Mixed hyperlipidemia   . Nephrolithiasis   . Neuropathy   . OSA (obstructive sleep apnea)   . Type 2 diabetes mellitus (HCC)     Past Surgical History:  Procedure Laterality Date  . ANKLE SURGERY    . KNEE SURGERY    . REPLACEMENT TOTAL KNEE    . TONSILLECTOMY      Current Medications: No outpatient medications have been marked as taking for the 05/16/20 encounter (Appointment) with Dyann Kief, PA-C.     Allergies:   Codeine and Dilantin [phenytoin]   Social History   Socioeconomic History    . Marital status: Unknown    Spouse name: Not on file  . Number of children: Not on file  . Years of education: Not on file  . Highest education level: Not on file  Occupational History  . Not on file  Tobacco Use  . Smoking status: Never Smoker  . Smokeless tobacco: Never Used  Vaping Use  . Vaping Use: Never used  Substance and Sexual Activity  . Alcohol use: Never  . Drug use: Never  . Sexual activity: Not on file  Other Topics Concern  . Not on file  Social History Narrative  . Not on file   Social Determinants of Health   Financial Resource Strain:   . Difficulty of Paying Living Expenses: Not on file  Food Insecurity:   . Worried About Programme researcher, broadcasting/film/video in the Last Year: Not on file  . Ran Out of Food in the Last Year: Not on file  Transportation Needs:   . Lack of Transportation (Medical): Not on file  . Lack of Transportation (Non-Medical): Not on file  Physical Activity:   . Days of Exercise per Week: Not on file  . Minutes of Exercise per Session: Not on file  Stress:   . Feeling of Stress : Not on file  Social Connections:   . Frequency of Communication with Friends and Family: Not on file  . Frequency of Social Gatherings  with Friends and Family: Not on file  . Attends Religious Services: Not on file  . Active Member of Clubs or Organizations: Not on file  . Attends Banker Meetings: Not on file  . Marital Status: Not on file     Family History:  The patient's ***family history includes CAD in her father; Cancer in her mother; Diabetes in her brother and sister; Diabetes Mellitus II in her father and mother; Hypertension in her father.   ROS:   Please see the history of present illness.    ROS All other systems reviewed and are negative.   PHYSICAL EXAM:   VS:  There were no vitals taken for this visit.  Physical Exam  GEN: Well nourished, well developed, in no acute distress  HEENT: normal  Neck: no JVD, carotid bruits, or  masses Cardiac:RRR; no murmurs, rubs, or gallops  Respiratory:  clear to auscultation bilaterally, normal work of breathing GI: soft, nontender, nondistended, + BS Ext: without cyanosis, clubbing, or edema, Good distal pulses bilaterally MS: no deformity or atrophy  Skin: warm and dry, no rash Neuro:  Alert and Oriented x 3, Strength and sensation are intact Psych: euthymic mood, full affect  Wt Readings from Last 3 Encounters:  03/31/20 (!) 317 lb 7.4 oz (144 kg)  02/29/20 (!) 328 lb (148.8 kg)  01/07/20 (!) 303 lb (137.4 kg)      Studies/Labs Reviewed:   EKG:  EKG is*** ordered today.  The ekg ordered today demonstrates ***  Recent Labs: 03/19/2020: ALT 12; B Natriuretic Peptide 525.0 03/27/2020: Magnesium 2.0 03/31/2020: BUN 70; Creatinine, Ser 1.72; Hemoglobin 9.0; Platelets 218; Potassium 4.3; Sodium 133   Lipid Panel    Component Value Date/Time   CHOL 127 03/24/2020 0556   TRIG 84 03/24/2020 0556   HDL 39 (L) 03/24/2020 0556   CHOLHDL 3.3 03/24/2020 0556   VLDL 17 03/24/2020 0556   LDLCALC 71 03/24/2020 0556    Additional studies/ records that were reviewed today include:   Limited echo 03/24/2020 IMPRESSIONS     1. Limited study.   2. Left ventricular ejection fraction, by estimation, is approximately  40%. The left ventricle has moderately decreased function. The left  ventricle demonstrates global hypokinesis.   3. Right ventricular systolic function is normal. The right ventricular  size is normal.   4. A trivial pericardial effusion is posterior to the left ventricle.   5. The mitral valve is abnormal. Trivial mitral valve regurgitation.  Moderate mitral annular calcification.   6. The aortic valve is tricuspid. Aortic valve regurgitation is not  visualized. Mild to moderate aortic valve sclerosis/calcification is  present, without any evidence of aortic stenosis.   7. The inferior vena cava is normal in size with greater than 50%  respiratory  variability, suggesting right atrial pressure of 3 mmHg.    Echocardiogram 12/03/2019:  1. Left ventricular ejection fraction, by estimation, is 45 to 50%. The  left ventricle has mildly decreased function. Left ventricular endocardial  border not optimally defined to evaluate regional wall motion. There is  moderate left ventricular  hypertrophy. Left ventricular diastolic parameters are indeterminate.  Elevated left atrial pressure.   2. Right ventricular systolic function is normal. The right ventricular  size is normal. There is mildly elevated pulmonary artery systolic  pressure.   3. Left atrial size was severely dilated.   4. Right atrial size was mildly dilated.   5. The mitral valve is abnormal. Mild mitral valve regurgitation. No  evidence of mitral stenosis.   6. The aortic valve is tricuspid. Aortic valve regurgitation is not  visualized. No aortic stenosis is present.   7. The inferior vena cava is dilated in size with <50% respiratory  variability, suggesting right atrial pressure of 15 mmHg.    Lexiscan Myoview 01/01/2020:  Electrically negative for ischemia  Probable normal perfusion and EXTENSIVE breast attenuation. No definite ischemia or scar. Would recomm echo if not done to confirm/clarify LVEF and wall motion.  LVEF calculated at 32%  High risk study due to LVEF REcomm echo if not done to evaluate wall motion/LVEF       ASSESSMENT:    No diagnosis found.   PLAN:  In order of problems listed above:   Cardiomyopathy LVEF 40% with global HK on limited echo 03/24/20-has declined cath in past.  Chronic systolic CHF. Discharged  03/31/20 after diuresing 18.5 L discharge weight 144 kg/317 lbs  CKD 3-HCTZ and losartan stopped in hospital. Started on coreg  HTN  Morbid obesity    Medication Adjustments/Labs and Tests Ordered: Current medicines are reviewed at length with the patient today.  Concerns regarding medicines are outlined above.  Medication  changes, Labs and Tests ordered today are listed in the Patient Instructions below. There are no Patient Instructions on file for this visit.   Elson Clan, PA-C  05/10/2020 9:59 AM    [facility] [address] [place], [place], [place]  [address] Phone: [phone]; Fax: [phone]

## 2020-05-16 ENCOUNTER — Ambulatory Visit: Payer: Medicare Other | Admitting: Physician Assistant

## 2020-09-01 ENCOUNTER — Ambulatory Visit: Payer: Medicare Other | Admitting: Cardiology

## 2020-09-13 DIAGNOSIS — E559 Vitamin D deficiency, unspecified: Secondary | ICD-10-CM | POA: Diagnosis not present

## 2020-09-13 DIAGNOSIS — E1140 Type 2 diabetes mellitus with diabetic neuropathy, unspecified: Secondary | ICD-10-CM | POA: Diagnosis not present

## 2020-10-10 ENCOUNTER — Ambulatory Visit: Payer: Medicare Other | Admitting: Cardiology

## 2020-10-10 NOTE — Progress Notes (Deleted)
Cardiology Office Note  Date: 10/10/2020   ID: Suzanne Tran, Suzanne Tran [birth year], MRN [id number]  PCP:  Celene Squibb, MD  Cardiologist:  Rozann Lesches, MD Electrophysiologist:  None   No chief complaint on file.   History of Present Illness: Suzanne Tran is a 65 y.o. female last seen in August 2021.  Cardiogram was in September 2021 at which point LVEF was approximately 40% with global hypokinesis, normal RV contraction, sclerotic aortic valve without stenosis.  Past Medical History:  Diagnosis Date  . Charcot ankle, right   . CKD (chronic kidney disease) stage 3, GFR 30-59 ml/min   . Diabetic Charcot foot ([place])   . Essential hypertension   . Mixed hyperlipidemia   . Nephrolithiasis   . Neuropathy   . OSA (obstructive sleep apnea)   . Type 2 diabetes mellitus ([place])     Past Surgical History:  Procedure Laterality Date  . ANKLE SURGERY    . KNEE SURGERY    . REPLACEMENT TOTAL KNEE    . TONSILLECTOMY      Current Outpatient Medications  Medication Sig Dispense Refill  . albuterol (VENTOLIN HFA) 108 (90 Base) MCG/ACT inhaler Inhale 2 puffs into the lungs every 6 (six) hours as needed.     Marland Kitchen ascorbic acid (VITAMIN C) 250 MG tablet Take by mouth.    Marland Kitchen aspirin EC 81 MG tablet Take 1 tablet (81 mg total) by mouth daily. Swallow whole. 90 tablet 3  . B Complex Vitamins (VITAMIN B-COMPLEX) TABS Take by mouth.    . benzonatate (TESSALON) 200 MG capsule Take 1 capsule (200 mg total) by mouth 3 (three) times daily. 20 capsule 0  . carvedilol (COREG) 3.125 MG tablet Take 1 tablet (3.125 mg total) by mouth 2 (two) times daily with a meal.    . furosemide (LASIX) 20 MG tablet Take 2 tablets (40 mg total) by mouth daily. 30 tablet   . guaiFENesin-dextromethorphan (ROBITUSSIN DM) 100-10 MG/5ML syrup Take 5 mLs by mouth every 4 (four) hours as needed for cough. 118 mL 0  . insulin aspart (NOVOLOG) 100 UNIT/ML injection Inject 0-5 Units into the skin at bedtime. 10 mL 11  . insulin  aspart (NOVOLOG) 100 UNIT/ML injection Inject 0-9 Units into the skin 3 (three) times daily with meals. 10 mL 11  . insulin glargine (LANTUS) 100 UNIT/ML injection Inject 0.25 mLs (25 Units total) into the skin daily. 10 mL 11  . levocetirizine (XYZAL) 5 MG tablet Take 1 tablet (5 mg total) by mouth at bedtime.    . potassium chloride (KLOR-CON) 10 MEQ tablet Take 10 mEq by mouth daily.    Marland Kitchen saccharomyces boulardii (FLORASTOR) 250 MG capsule Take 1 capsule (250 mg total) by mouth 2 (two) times daily.     No current facility-administered medications for this visit.   Allergies:  Codeine and Dilantin [phenytoin]   Social History: The patient  reports that she has never smoked. She has never used smokeless tobacco. She reports that she does not drink alcohol and does not use drugs.   Family History: The patient's family history includes CAD in her father; Cancer in her mother; Diabetes in her brother and sister; Diabetes Mellitus II in her father and mother; Hypertension in her father.   ROS:  Please see the history of present illness. Otherwise, complete review of systems is positive for {NONE DEFAULTED:18576::"none"}.  All other systems are reviewed and negative.   Physical Exam: VS:  There were no vitals taken for  this visit., BMI There is no height or weight on file to calculate BMI.  Wt Readings from Last 3 Encounters:  03/31/20 (!) 317 lb 7.4 oz (144 kg)  02/29/20 (!) 328 lb (148.8 kg)  01/07/20 (!) 303 lb (137.4 kg)    General: Patient appears comfortable at rest. HEENT: Conjunctiva and lids normal, oropharynx clear with moist mucosa. Neck: Supple, no elevated JVP or carotid bruits, no thyromegaly. Lungs: Clear to auscultation, nonlabored breathing at rest. Cardiac: Regular rate and rhythm, no S3 or significant systolic murmur, no pericardial rub. Abdomen: Soft, nontender, no hepatomegaly, bowel sounds present, no guarding or rebound. Extremities: No pitting edema, distal pulses  2+. Skin: Warm and dry. Musculoskeletal: No kyphosis. Neuropsychiatric: Alert and oriented x3, affect grossly appropriate.  ECG:  An ECG dated 03/19/2020 was personally reviewed today and demonstrated:  Sinus rhythm with low voltage, IVCD, poor R wave progression.  Recent Labwork: 03/19/2020: ALT 12; AST 16; B Natriuretic Peptide 525.0 03/27/2020: Magnesium 2.0 03/31/2020: BUN 70; Creatinine, Ser 1.72; Hemoglobin 9.0; Platelets 218; Potassium 4.3; Sodium 133     Component Value Date/Time   CHOL 127 03/24/2020 0556   TRIG 84 03/24/2020 0556   HDL 39 (L) 03/24/2020 0556   CHOLHDL 3.3 03/24/2020 0556   VLDL 17 03/24/2020 0556   LDLCALC 71 03/24/2020 0556    Other Studies Reviewed Today:  Echocardiogram 03/24/2020: 1. Limited study.  2. Left ventricular ejection fraction, by estimation, is approximately  40%. The left ventricle has moderately decreased function. The left  ventricle demonstrates global hypokinesis.  3. Right ventricular systolic function is normal. The right ventricular  size is normal.  4. A trivial pericardial effusion is posterior to the left ventricle.  5. The mitral valve is abnormal. Trivial mitral valve regurgitation.  Moderate mitral annular calcification.  6. The aortic valve is tricuspid. Aortic valve regurgitation is not  visualized. Mild to moderate aortic valve sclerosis/calcification is  present, without any evidence of aortic stenosis.  7. The inferior vena cava is normal in size with greater than 50%  respiratory variability, suggesting right atrial pressure of 3 mmHg.   Assessment and Plan:   Medication Adjustments/Labs and Tests Ordered: Current medicines are reviewed at length with the patient today.  Concerns regarding medicines are outlined above.   Tests Ordered: No orders of the defined types were placed in this encounter.   Medication Changes: No orders of the defined types were placed in this encounter.   Disposition:  Follow up  {follow up:15908}  Signed, Satira Sark, MD, [facility] 10/10/2020 8:28 AM    [facility] [place]. [address], [place], [place] [address] Phone: [phone]; Fax: [phone]

## 2020-10-31 DIAGNOSIS — E559 Vitamin D deficiency, unspecified: Secondary | ICD-10-CM | POA: Diagnosis not present

## 2020-12-15 ENCOUNTER — Other Ambulatory Visit: Payer: Self-pay | Admitting: Nephrology

## 2020-12-15 ENCOUNTER — Other Ambulatory Visit (HOSPITAL_COMMUNITY): Payer: Self-pay | Admitting: Nephrology

## 2020-12-15 DIAGNOSIS — D638 Anemia in other chronic diseases classified elsewhere: Secondary | ICD-10-CM | POA: Diagnosis not present

## 2020-12-15 DIAGNOSIS — E1122 Type 2 diabetes mellitus with diabetic chronic kidney disease: Secondary | ICD-10-CM | POA: Diagnosis not present

## 2020-12-15 DIAGNOSIS — N200 Calculus of kidney: Secondary | ICD-10-CM | POA: Diagnosis not present

## 2020-12-15 DIAGNOSIS — I129 Hypertensive chronic kidney disease with stage 1 through stage 4 chronic kidney disease, or unspecified chronic kidney disease: Secondary | ICD-10-CM | POA: Diagnosis not present

## 2020-12-15 DIAGNOSIS — E6609 Other obesity due to excess calories: Secondary | ICD-10-CM | POA: Diagnosis not present

## 2020-12-15 DIAGNOSIS — R809 Proteinuria, unspecified: Secondary | ICD-10-CM

## 2020-12-15 DIAGNOSIS — N189 Chronic kidney disease, unspecified: Secondary | ICD-10-CM | POA: Diagnosis not present

## 2020-12-15 DIAGNOSIS — E559 Vitamin D deficiency, unspecified: Secondary | ICD-10-CM | POA: Diagnosis not present

## 2020-12-15 DIAGNOSIS — Z79899 Other long term (current) drug therapy: Secondary | ICD-10-CM | POA: Diagnosis not present

## 2020-12-15 DIAGNOSIS — E1129 Type 2 diabetes mellitus with other diabetic kidney complication: Secondary | ICD-10-CM | POA: Diagnosis not present

## 2020-12-15 DIAGNOSIS — I5022 Chronic systolic (congestive) heart failure: Secondary | ICD-10-CM | POA: Diagnosis not present

## 2020-12-28 ENCOUNTER — Other Ambulatory Visit: Payer: Self-pay

## 2020-12-28 ENCOUNTER — Ambulatory Visit (HOSPITAL_COMMUNITY)
Admission: RE | Admit: 2020-12-28 | Discharge: 2020-12-28 | Disposition: A | Discharge status: Home / Self Care | Payer: Medicare Other | Source: Ambulatory Visit | Attending: Nephrology | Admitting: Nephrology

## 2020-12-28 DIAGNOSIS — I129 Hypertensive chronic kidney disease with stage 1 through stage 4 chronic kidney disease, or unspecified chronic kidney disease: Secondary | ICD-10-CM | POA: Diagnosis not present

## 2020-12-28 DIAGNOSIS — E1122 Type 2 diabetes mellitus with diabetic chronic kidney disease: Secondary | ICD-10-CM | POA: Diagnosis not present

## 2020-12-28 DIAGNOSIS — D638 Anemia in other chronic diseases classified elsewhere: Secondary | ICD-10-CM | POA: Diagnosis not present

## 2020-12-28 DIAGNOSIS — R809 Proteinuria, unspecified: Secondary | ICD-10-CM | POA: Insufficient documentation

## 2021-01-20 ENCOUNTER — Inpatient Hospital Stay (HOSPITAL_COMMUNITY)
Admission: EM | Admit: 2021-01-20 | Discharge: 2021-01-23 | DRG: 637 | Disposition: A | Discharge status: Home / Self Care | Payer: Medicare Other | Attending: Internal Medicine | Admitting: Internal Medicine

## 2021-01-20 ENCOUNTER — Emergency Department (HOSPITAL_COMMUNITY): Payer: Medicare Other

## 2021-01-20 ENCOUNTER — Encounter (HOSPITAL_COMMUNITY): Payer: Self-pay

## 2021-01-20 ENCOUNTER — Other Ambulatory Visit: Payer: Self-pay

## 2021-01-20 DIAGNOSIS — U071 COVID-19: Secondary | ICD-10-CM | POA: Diagnosis not present

## 2021-01-20 DIAGNOSIS — R600 Localized edema: Secondary | ICD-10-CM

## 2021-01-20 DIAGNOSIS — L03115 Cellulitis of right lower limb: Secondary | ICD-10-CM | POA: Diagnosis present

## 2021-01-20 DIAGNOSIS — Z7982 Long term (current) use of aspirin: Secondary | ICD-10-CM

## 2021-01-20 DIAGNOSIS — L0390 Cellulitis, unspecified: Secondary | ICD-10-CM | POA: Diagnosis present

## 2021-01-20 DIAGNOSIS — Z79899 Other long term (current) drug therapy: Secondary | ICD-10-CM

## 2021-01-20 DIAGNOSIS — Z8249 Family history of ischemic heart disease and other diseases of the circulatory system: Secondary | ICD-10-CM

## 2021-01-20 DIAGNOSIS — E11628 Type 2 diabetes mellitus with other skin complications: Principal | ICD-10-CM | POA: Diagnosis present

## 2021-01-20 DIAGNOSIS — I5032 Chronic diastolic (congestive) heart failure: Secondary | ICD-10-CM | POA: Diagnosis present

## 2021-01-20 DIAGNOSIS — I130 Hypertensive heart and chronic kidney disease with heart failure and stage 1 through stage 4 chronic kidney disease, or unspecified chronic kidney disease: Secondary | ICD-10-CM | POA: Diagnosis present

## 2021-01-20 DIAGNOSIS — E1140 Type 2 diabetes mellitus with diabetic neuropathy, unspecified: Secondary | ICD-10-CM | POA: Diagnosis present

## 2021-01-20 DIAGNOSIS — I10 Essential (primary) hypertension: Secondary | ICD-10-CM | POA: Diagnosis present

## 2021-01-20 DIAGNOSIS — E119 Type 2 diabetes mellitus without complications: Secondary | ICD-10-CM

## 2021-01-20 DIAGNOSIS — L02611 Cutaneous abscess of right foot: Secondary | ICD-10-CM | POA: Diagnosis present

## 2021-01-20 DIAGNOSIS — Z888 Allergy status to other drugs, medicaments and biological substances status: Secondary | ICD-10-CM

## 2021-01-20 DIAGNOSIS — Z794 Long term (current) use of insulin: Secondary | ICD-10-CM

## 2021-01-20 DIAGNOSIS — E11610 Type 2 diabetes mellitus with diabetic neuropathic arthropathy: Secondary | ICD-10-CM | POA: Diagnosis present

## 2021-01-20 DIAGNOSIS — E782 Mixed hyperlipidemia: Secondary | ICD-10-CM | POA: Diagnosis present

## 2021-01-20 DIAGNOSIS — Z833 Family history of diabetes mellitus: Secondary | ICD-10-CM

## 2021-01-20 DIAGNOSIS — Z809 Family history of malignant neoplasm, unspecified: Secondary | ICD-10-CM

## 2021-01-20 DIAGNOSIS — E1122 Type 2 diabetes mellitus with diabetic chronic kidney disease: Secondary | ICD-10-CM | POA: Diagnosis present

## 2021-01-20 DIAGNOSIS — N183 Chronic kidney disease, stage 3 unspecified: Secondary | ICD-10-CM | POA: Diagnosis present

## 2021-01-20 DIAGNOSIS — N1832 Chronic kidney disease, stage 3b: Secondary | ICD-10-CM | POA: Diagnosis present

## 2021-01-20 DIAGNOSIS — Z885 Allergy status to narcotic agent status: Secondary | ICD-10-CM

## 2021-01-20 DIAGNOSIS — G4733 Obstructive sleep apnea (adult) (pediatric): Secondary | ICD-10-CM | POA: Diagnosis present

## 2021-01-20 DIAGNOSIS — D631 Anemia in chronic kidney disease: Secondary | ICD-10-CM | POA: Diagnosis present

## 2021-01-20 DIAGNOSIS — D649 Anemia, unspecified: Secondary | ICD-10-CM | POA: Diagnosis present

## 2021-01-20 MED ORDER — SODIUM CHLORIDE 0.9 % IV SOLN
2.0000 g | Freq: Once | INTRAVENOUS | Status: AC
  Administered 2021-01-21: 2 g via INTRAVENOUS
  Filled 2021-01-20: qty 20

## 2021-01-20 NOTE — ED Notes (Signed)
ED Provider at bedside. 

## 2021-01-20 NOTE — ED Triage Notes (Signed)
Pt arrived from home via POV c/o right lateral foot pain since yesterday. Pt reports driving her car on a long trip yesterday and notes it increased swelling in her feet. Pt reports a new bump on her foot that wasn't there before and it is pain to pressure on right foot.

## 2021-01-20 NOTE — ED Provider Notes (Signed)
[facility] EMERGENCY DEPARTMENT Provider Note   CSN: [id number] Arrival date & time: 01/20/21  2126     History Chief Complaint  Patient presents with   Foot Pain    Suzanne Tran is a 65 y.o. female.   Foot Pain This is a new problem. The current episode started 2 days ago. The problem occurs constantly. The problem has been gradually worsening. Pertinent negatives include no chest pain, no abdominal pain, no headaches and no shortness of breath. Nothing aggravates the symptoms. Nothing relieves the symptoms. She has tried nothing for the symptoms. The treatment provided no relief.      Past Medical History:  Diagnosis Date   Charcot ankle, right    CKD (chronic kidney disease) stage 3, GFR 30-59 ml/min (HCC)    Diabetic Charcot foot (HCC)    Essential hypertension    Mixed hyperlipidemia    Nephrolithiasis    Neuropathy    OSA (obstructive sleep apnea)    Type 2 diabetes mellitus (HCC)     Patient Active Problem List   Diagnosis Date Noted   Cellulitis of right lower extremity 01/21/2021   Essential hypertension    Mixed hyperlipidemia    OSA (obstructive sleep apnea)    Normocytic anemia    Chronic diastolic heart failure (HCC)    Acute on chronic systolic (congestive) heart failure (HCC) 03/19/2020   CKD (chronic kidney disease) stage 3, GFR 30-59 ml/min (HCC)    Type 2 diabetes mellitus (HCC)    Hypertensive urgency    Ulcer of right foot (HCC)     Past Surgical History:  Procedure Laterality Date   ANKLE SURGERY     KNEE SURGERY     REPLACEMENT TOTAL KNEE     TONSILLECTOMY       OB History     Gravida  1   Para  1   Term  1   Preterm      AB      Living  1      SAB      IAB      Ectopic      Multiple      Live Births              Family History  Problem Relation Age of Onset   Diabetes Mellitus II Mother    Cancer Mother    Hypertension Father    CAD Father    Diabetes Mellitus II Father    Diabetes Sister     Diabetes Brother     Social History   Tobacco Use   Smoking status: Never   Smokeless tobacco: Never  Vaping Use   Vaping Use: Never used  Substance Use Topics   Alcohol use: Never   Drug use: Never    Home Medications Prior to Admission medications   Medication Sig Start Date End Date Taking? Authorizing Provider  albuterol (VENTOLIN HFA) 108 (90 Base) MCG/ACT inhaler Inhale 2 puffs into the lungs every 6 (six) hours as needed.  11/04/19   [provider]  ascorbic acid (VITAMIN C) 250 MG tablet Take by mouth.    [provider]  aspirin EC 81 MG tablet Take 1 tablet (81 mg total) by mouth daily. Swallow whole. 01/07/20   Strader, Lennart Pall, PA-C  B Complex Vitamins (VITAMIN B-COMPLEX) TABS Take by mouth.    [provider]  benzonatate (TESSALON) 200 MG capsule Take 1 capsule (200 mg total) by mouth 3 (three) times daily.  03/31/20   Lorin Glassahal, Binaya, MD  carvedilol (COREG) 3.125 MG tablet Take 1 tablet (3.125 mg total) by mouth 2 (two) times daily with a meal. 03/31/20   Dahal, Melina SchoolsBinaya, MD  furosemide (LASIX) 20 MG tablet Take 2 tablets (40 mg total) by mouth daily. 03/31/20   Dahal, Melina SchoolsBinaya, MD  guaiFENesin-dextromethorphan (ROBITUSSIN DM) 100-10 MG/5ML syrup Take 5 mLs by mouth every 4 (four) hours as needed for cough. 03/31/20   Dahal, Melina SchoolsBinaya, MD  insulin aspart (NOVOLOG) 100 UNIT/ML injection Inject 0-5 Units into the skin at bedtime. 03/31/20   Dahal, Melina SchoolsBinaya, MD  insulin aspart (NOVOLOG) 100 UNIT/ML injection Inject 0-9 Units into the skin 3 (three) times daily with meals. 03/31/20   Dahal, Melina SchoolsBinaya, MD  insulin glargine (LANTUS) 100 UNIT/ML injection Inject 0.25 mLs (25 Units total) into the skin daily. 04/01/20   Dahal, Melina SchoolsBinaya, MD  levocetirizine (XYZAL) 5 MG tablet Take 1 tablet (5 mg total) by mouth at bedtime. 03/31/20   Dahal, Melina SchoolsBinaya, MD  potassium chloride (KLOR-CON) 10 MEQ tablet Take 10 mEq by mouth daily. 01/27/20   [provider]  saccharomyces  boulardii (FLORASTOR) 250 MG capsule Take 1 capsule (250 mg total) by mouth 2 (two) times daily. 03/31/20   Lorin Glassahal, Binaya, MD    Allergies    Codeine, Coumadin [warfarin], and Dilantin [phenytoin]  Review of Systems   Review of Systems  Respiratory:  Negative for shortness of breath.   Cardiovascular:  Negative for chest pain.  Gastrointestinal:  Negative for abdominal pain.  Neurological:  Negative for headaches.  All other systems reviewed and are negative.  Physical Exam Updated Vital Signs BP (!) 133/48 (BP Location: Right Arm)   Pulse 87   Temp 98.4 F (36.9 C) (Oral)   Resp 19   Ht 5\' 10"  (1.778 m)   Wt 123 kg   SpO2 100%   BMI 38.91 kg/m   Physical Exam Vitals and nursing note reviewed.  Constitutional:      Appearance: She is well-developed.  HENT:     Head: Normocephalic and atraumatic.     Nose: No congestion or rhinorrhea.     Mouth/Throat:     Mouth: Mucous membranes are moist.  Eyes:     Pupils: Pupils are equal, round, and reactive to light.  Cardiovascular:     Rate and Rhythm: Normal rate and regular rhythm.  Pulmonary:     Effort: No respiratory distress.     Breath sounds: No stridor. No wheezing.  Abdominal:     General: Abdomen is flat. There is no distension.  Musculoskeletal:     Cervical back: Normal range of motion.  Skin:    General: Skin is warm and dry.     Findings: Erythema (right lower extremity from foot to just distal to knee. associated blister to right lateral foot. no purulent drainage.) and rash present.  Neurological:     General: No focal deficit present.     Mental Status: She is alert.    ED Results / Procedures / Treatments   Labs (all labs ordered are listed, but only abnormal results are displayed) Labs Reviewed  RESP PANEL BY RT-PCR (FLU A&B, COVID) ARPGX2 - Abnormal; Notable for the following components:      Result Value   SARS Coronavirus 2 by RT PCR POSITIVE (*)    All other components within normal limits   COMPREHENSIVE METABOLIC PANEL - Abnormal; Notable for the following components:   Sodium 130 (*)    Glucose,  Bld 176 (*)    BUN 48 (*)    Creatinine, Ser 1.86 (*)    Calcium 8.6 (*)    Albumin 3.4 (*)    GFR, Estimated 30 (*)    All other components within normal limits  CBC WITH DIFFERENTIAL/PLATELET - Abnormal; Notable for the following components:   RBC 3.67 (*)    Hemoglobin 10.8 (*)    HCT 32.6 (*)    All other components within normal limits  BRAIN NATRIURETIC PEPTIDE - Abnormal; Notable for the following components:   B Natriuretic Peptide 264.0 (*)    All other components within normal limits  D-DIMER, QUANTITATIVE - Abnormal; Notable for the following components:   D-Dimer, Quant 1.81 (*)    All other components within normal limits  GLUCOSE, CAPILLARY - Abnormal; Notable for the following components:   Glucose-Capillary 149 (*)    All other components within normal limits  FIBRINOGEN - Abnormal; Notable for the following components:   Fibrinogen 552 (*)    All other components within normal limits  C-REACTIVE PROTEIN - Abnormal; Notable for the following components:   CRP 10.9 (*)    All other components within normal limits  TROPONIN I (HIGH SENSITIVITY) - Abnormal; Notable for the following components:   Troponin I (High Sensitivity) 31 (*)    All other components within normal limits  TROPONIN I (HIGH SENSITIVITY) - Abnormal; Notable for the following components:   Troponin I (High Sensitivity) 32 (*)    All other components within normal limits  CULTURE, BLOOD (SINGLE)  URINE CULTURE  LACTIC ACID, PLASMA  LACTIC ACID, PLASMA  PROTIME-INR  APTT  FERRITIN  PROCALCITONIN  URINALYSIS, ROUTINE W REFLEX MICROSCOPIC  CBC  BASIC METABOLIC PANEL  HEMOGLOBIN A1C    EKG EKG Interpretation  Date/Time:  Saturday January 21 2021 00:14:10 EDT Ventricular Rate:  87 PR Interval:  168 QRS Duration: 101 QT Interval:  402 QTC Calculation: 484 R Axis:   -49 Text  Interpretation: Sinus rhythm LAD, consider left anterior fascicular block Abnormal R-wave progression, late transition Nonspecific T abnormalities, lateral leads Confirmed by Marily Memos [phone]) on 01/21/2021 12:48:09 AM  Radiology DG Chest Port 1 View  Result Date: 01/21/2021 CLINICAL DATA:  Possible sepsis EXAM: PORTABLE CHEST 1 VIEW COMPARISON:  03/19/2020 FINDINGS: Cardiac shadow is stable. Aortic calcifications are again seen. Lungs are well aerated bilaterally. Small left effusion and left basilar atelectasis is seen. No pneumothorax is noted. No bony abnormality is seen. IMPRESSION: Small left effusion and left basilar atelectasis. Electronically Signed   By: Alcide Clever M.D.   On: 01/21/2021 00:43   DG Foot Complete Right  Result Date: 01/21/2021 CLINICAL DATA:  Lateral foot pain for 2 days, no known injury, initial encounter EXAM: RIGHT FOOT COMPLETE - 3+ VIEW COMPARISON:  08/05/2018 FINDINGS: Degenerative changes in the tarsal metatarsal articulation are seen. Flattening of the plantar arch is noted. Some tarsal degenerative changes are noted as well. Previously seen areas of bony erosion in the cuboid are no longer present. Soft tissue swelling is seen inferiorly and laterally similar to that noted on the prior exam with less soft tissue ulceration. Small calcaneal spur is noted. No acute fracture is seen. IMPRESSION: Chronic degenerative changes without acute bony abnormality. Soft tissue swelling inferior laterally without focal ulceration. Electronically Signed   By: Alcide Clever M.D.   On: 01/21/2021 00:47    Procedures Procedures   Medications Ordered in ED Medications  cefTRIAXone (ROCEPHIN) 2 g in sodium chloride  0.9 % 100 mL IVPB (has no administration in time range)  enoxaparin (LOVENOX) injection 144 mg (144 mg Subcutaneous Patient Refused/Not Given 01/21/21 0241)  enoxaparin (LOVENOX) injection 144 mg (has no administration in time range)  acetaminophen (TYLENOL) tablet 650 mg  (has no administration in time range)    Or  acetaminophen (TYLENOL) suppository 650 mg (has no administration in time range)  ondansetron (ZOFRAN) tablet 4 mg (has no administration in time range)    Or  ondansetron (ZOFRAN) injection 4 mg (has no administration in time range)  insulin aspart (novoLOG) injection 0-20 Units (has no administration in time range)  ascorbic acid (VITAMIN C) tablet 500 mg (has no administration in time range)  zinc sulfate capsule 220 mg (has no administration in time range)  guaiFENesin-dextromethorphan (ROBITUSSIN DM) 100-10 MG/5ML syrup 10 mL (has no administration in time range)  albuterol (VENTOLIN HFA) 108 (90 Base) MCG/ACT inhaler 2 puff (has no administration in time range)  remdesivir 100 mg in sodium chloride 0.9 % 100 mL IVPB (100 mg Intravenous Not Given 01/21/21 0311)    Followed by  remdesivir 100 mg in sodium chloride 0.9 % 100 mL IVPB (has no administration in time range)  cefTRIAXone (ROCEPHIN) 2 g in sodium chloride 0.9 % 100 mL IVPB (0 g Intravenous Stopped 01/21/21 0051)    ED Course  I have reviewed the triage vital signs and the nursing notes.  Pertinent labs & imaging results that were available during my care of the patient were reviewed by me and considered in my medical decision making (see chart for details).    MDM Rules/Calculators/A&P                          Cellulitis with rapid worsening. Possibly DVT with recent travel so will dose lovenox. Not septic. Iv abx started. Admit to hospitalist.   Final Clinical Impression(s) / ED Diagnoses Final diagnoses:  Cellulitis of right lower extremity    Rx / DC Orders ED Discharge Orders     None        Byrd Terrero, Barbara Cower, MD 01/21/21 [phone]

## 2021-01-21 ENCOUNTER — Observation Stay (HOSPITAL_COMMUNITY): Payer: Medicare Other

## 2021-01-21 ENCOUNTER — Encounter (HOSPITAL_COMMUNITY): Payer: Self-pay | Admitting: Internal Medicine

## 2021-01-21 DIAGNOSIS — I10 Essential (primary) hypertension: Secondary | ICD-10-CM | POA: Diagnosis not present

## 2021-01-21 DIAGNOSIS — Z794 Long term (current) use of insulin: Secondary | ICD-10-CM | POA: Diagnosis not present

## 2021-01-21 DIAGNOSIS — Z888 Allergy status to other drugs, medicaments and biological substances status: Secondary | ICD-10-CM | POA: Diagnosis not present

## 2021-01-21 DIAGNOSIS — G4733 Obstructive sleep apnea (adult) (pediatric): Secondary | ICD-10-CM | POA: Diagnosis present

## 2021-01-21 DIAGNOSIS — E782 Mixed hyperlipidemia: Secondary | ICD-10-CM | POA: Diagnosis present

## 2021-01-21 DIAGNOSIS — D631 Anemia in chronic kidney disease: Secondary | ICD-10-CM | POA: Diagnosis present

## 2021-01-21 DIAGNOSIS — L0390 Cellulitis, unspecified: Secondary | ICD-10-CM | POA: Diagnosis present

## 2021-01-21 DIAGNOSIS — I5032 Chronic diastolic (congestive) heart failure: Secondary | ICD-10-CM | POA: Diagnosis present

## 2021-01-21 DIAGNOSIS — Z79899 Other long term (current) drug therapy: Secondary | ICD-10-CM | POA: Diagnosis not present

## 2021-01-21 DIAGNOSIS — Z8249 Family history of ischemic heart disease and other diseases of the circulatory system: Secondary | ICD-10-CM | POA: Diagnosis not present

## 2021-01-21 DIAGNOSIS — Z7982 Long term (current) use of aspirin: Secondary | ICD-10-CM | POA: Diagnosis not present

## 2021-01-21 DIAGNOSIS — E1140 Type 2 diabetes mellitus with diabetic neuropathy, unspecified: Secondary | ICD-10-CM | POA: Diagnosis present

## 2021-01-21 DIAGNOSIS — Z833 Family history of diabetes mellitus: Secondary | ICD-10-CM | POA: Diagnosis not present

## 2021-01-21 DIAGNOSIS — L03115 Cellulitis of right lower limb: Secondary | ICD-10-CM

## 2021-01-21 DIAGNOSIS — U071 COVID-19: Secondary | ICD-10-CM | POA: Diagnosis present

## 2021-01-21 DIAGNOSIS — E11610 Type 2 diabetes mellitus with diabetic neuropathic arthropathy: Secondary | ICD-10-CM | POA: Diagnosis present

## 2021-01-21 DIAGNOSIS — E11628 Type 2 diabetes mellitus with other skin complications: Secondary | ICD-10-CM | POA: Diagnosis present

## 2021-01-21 DIAGNOSIS — Z885 Allergy status to narcotic agent status: Secondary | ICD-10-CM | POA: Diagnosis not present

## 2021-01-21 DIAGNOSIS — E1122 Type 2 diabetes mellitus with diabetic chronic kidney disease: Secondary | ICD-10-CM | POA: Diagnosis present

## 2021-01-21 DIAGNOSIS — N1832 Chronic kidney disease, stage 3b: Secondary | ICD-10-CM | POA: Diagnosis present

## 2021-01-21 DIAGNOSIS — M7989 Other specified soft tissue disorders: Secondary | ICD-10-CM | POA: Diagnosis not present

## 2021-01-21 DIAGNOSIS — N1831 Chronic kidney disease, stage 3a: Secondary | ICD-10-CM | POA: Diagnosis not present

## 2021-01-21 DIAGNOSIS — R600 Localized edema: Secondary | ICD-10-CM | POA: Diagnosis not present

## 2021-01-21 DIAGNOSIS — Z809 Family history of malignant neoplasm, unspecified: Secondary | ICD-10-CM | POA: Diagnosis not present

## 2021-01-21 DIAGNOSIS — L02611 Cutaneous abscess of right foot: Secondary | ICD-10-CM | POA: Diagnosis present

## 2021-01-21 DIAGNOSIS — D649 Anemia, unspecified: Secondary | ICD-10-CM | POA: Diagnosis present

## 2021-01-21 DIAGNOSIS — I130 Hypertensive heart and chronic kidney disease with heart failure and stage 1 through stage 4 chronic kidney disease, or unspecified chronic kidney disease: Secondary | ICD-10-CM | POA: Diagnosis present

## 2021-01-21 LAB — BRAIN NATRIURETIC PEPTIDE: B Natriuretic Peptide: 264 pg/mL — ABNORMAL HIGH (ref 0.0–100.0)

## 2021-01-21 LAB — CBC WITH DIFFERENTIAL/PLATELET
Abs Immature Granulocytes: 0.04 10*3/uL (ref 0.00–0.07)
Basophils Absolute: 0 10*3/uL (ref 0.0–0.1)
Basophils Relative: 1 %
Eosinophils Absolute: 0 10*3/uL (ref 0.0–0.5)
Eosinophils Relative: 0 %
HCT: 32.6 % — ABNORMAL LOW (ref 36.0–46.0)
Hemoglobin: 10.8 g/dL — ABNORMAL LOW (ref 12.0–15.0)
Immature Granulocytes: 1 %
Lymphocytes Relative: 21 %
Lymphs Abs: 1.6 10*3/uL (ref 0.7–4.0)
MCH: 29.4 pg (ref 26.0–34.0)
MCHC: 33.1 g/dL (ref 30.0–36.0)
MCV: 88.8 fL (ref 80.0–100.0)
Monocytes Absolute: 0.9 10*3/uL (ref 0.1–1.0)
Monocytes Relative: 12 %
Neutro Abs: 5 10*3/uL (ref 1.7–7.7)
Neutrophils Relative %: 65 %
Platelets: 244 10*3/uL (ref 150–400)
RBC: 3.67 MIL/uL — ABNORMAL LOW (ref 3.87–5.11)
RDW: 12.9 % (ref 11.5–15.5)
WBC: 7.5 10*3/uL (ref 4.0–10.5)
nRBC: 0 % (ref 0.0–0.2)

## 2021-01-21 LAB — GLUCOSE, CAPILLARY
Glucose-Capillary: 149 mg/dL — ABNORMAL HIGH (ref 70–99)
Glucose-Capillary: 108 mg/dL — ABNORMAL HIGH (ref 70–99)
Glucose-Capillary: 194 mg/dL — ABNORMAL HIGH (ref 70–99)
Glucose-Capillary: 116 mg/dL — ABNORMAL HIGH (ref 70–99)
Glucose-Capillary: 212 mg/dL — ABNORMAL HIGH (ref 70–99)

## 2021-01-21 LAB — BASIC METABOLIC PANEL
Anion gap: 8 (ref 5–15)
BUN: 48 mg/dL — ABNORMAL HIGH (ref 8–23)
CO2: 25 mmol/L (ref 22–32)
Calcium: 8.4 mg/dL — ABNORMAL LOW (ref 8.9–10.3)
Chloride: 100 mmol/L (ref 98–111)
Creatinine, Ser: 1.83 mg/dL — ABNORMAL HIGH (ref 0.44–1.00)
GFR, Estimated: 30 mL/min — ABNORMAL LOW (ref >60)
Glucose, Bld: 108 mg/dL — ABNORMAL HIGH (ref 70–99)
Potassium: 3.6 mmol/L (ref 3.5–5.1)
Sodium: 133 mmol/L — ABNORMAL LOW (ref 135–145)

## 2021-01-21 LAB — COMPREHENSIVE METABOLIC PANEL
ALT: 14 U/L (ref 0–44)
AST: 16 U/L (ref 15–41)
Albumin: 3.4 g/dL — ABNORMAL LOW (ref 3.5–5.0)
Alkaline Phosphatase: 55 U/L (ref 38–126)
Anion gap: 8 (ref 5–15)
BUN: 48 mg/dL — ABNORMAL HIGH (ref 8–23)
CO2: 24 mmol/L (ref 22–32)
Calcium: 8.6 mg/dL — ABNORMAL LOW (ref 8.9–10.3)
Chloride: 98 mmol/L (ref 98–111)
Creatinine, Ser: 1.86 mg/dL — ABNORMAL HIGH (ref 0.44–1.00)
GFR, Estimated: 30 mL/min — ABNORMAL LOW (ref >60)
Glucose, Bld: 176 mg/dL — ABNORMAL HIGH (ref 70–99)
Potassium: 3.9 mmol/L (ref 3.5–5.1)
Sodium: 130 mmol/L — ABNORMAL LOW (ref 135–145)
Total Bilirubin: 0.5 mg/dL (ref 0.3–1.2)
Total Protein: 7.2 g/dL (ref 6.5–8.1)

## 2021-01-21 LAB — RESP PANEL BY RT-PCR (FLU A&B, COVID) ARPGX2
Influenza A by PCR: NEGATIVE
Influenza B by PCR: NEGATIVE
SARS Coronavirus 2 by RT PCR: POSITIVE — AB

## 2021-01-21 LAB — FERRITIN: Ferritin: 109 ng/mL (ref 11–307)

## 2021-01-21 LAB — PROCALCITONIN: Procalcitonin: 0.13 ng/mL

## 2021-01-21 LAB — CBC
HCT: 31 % — ABNORMAL LOW (ref 36.0–46.0)
Hemoglobin: 10.3 g/dL — ABNORMAL LOW (ref 12.0–15.0)
MCH: 30.1 pg (ref 26.0–34.0)
MCHC: 33.2 g/dL (ref 30.0–36.0)
MCV: 90.6 fL (ref 80.0–100.0)
Platelets: 217 10*3/uL (ref 150–400)
RBC: 3.42 MIL/uL — ABNORMAL LOW (ref 3.87–5.11)
RDW: 13 % (ref 11.5–15.5)
WBC: 5.6 10*3/uL (ref 4.0–10.5)
nRBC: 0 % (ref 0.0–0.2)

## 2021-01-21 LAB — D-DIMER, QUANTITATIVE: D-Dimer, Quant: 1.81 ug/mL-FEU — ABNORMAL HIGH (ref 0.00–0.50)

## 2021-01-21 LAB — PROTIME-INR
INR: 1.1 (ref 0.8–1.2)
Prothrombin Time: 14.4 seconds (ref 11.4–15.2)

## 2021-01-21 LAB — FIBRINOGEN: Fibrinogen: 552 mg/dL — ABNORMAL HIGH (ref 210–475)

## 2021-01-21 LAB — LACTIC ACID, PLASMA
Lactic Acid, Venous: 0.9 mmol/L (ref 0.5–1.9)
Lactic Acid, Venous: 0.9 mmol/L (ref 0.5–1.9)

## 2021-01-21 LAB — APTT: aPTT: 36 seconds (ref 24–36)

## 2021-01-21 LAB — TROPONIN I (HIGH SENSITIVITY)
Troponin I (High Sensitivity): 31 ng/L — ABNORMAL HIGH (ref <18)
Troponin I (High Sensitivity): 32 ng/L — ABNORMAL HIGH (ref <18)

## 2021-01-21 LAB — C-REACTIVE PROTEIN: CRP: 10.9 mg/dL — ABNORMAL HIGH (ref <1.0)

## 2021-01-21 MED ORDER — ALBUTEROL SULFATE HFA 108 (90 BASE) MCG/ACT IN AERS: 2.0000 | INHALATION_SPRAY | RESPIRATORY_TRACT | Status: DC | PRN

## 2021-01-21 MED ORDER — ASPIRIN EC 81 MG PO TBEC
81.0000 mg | DELAYED_RELEASE_TABLET | Freq: Every day | ORAL | Status: DC
  Administered 2021-01-21 – 2021-01-23 (×3): 81 mg via ORAL
  Filled 2021-01-21 (×3): qty 1

## 2021-01-21 MED ORDER — ZINC SULFATE 220 (50 ZN) MG PO CAPS
220.0000 mg | ORAL_CAPSULE | Freq: Every day | ORAL | Status: DC
  Administered 2021-01-21 – 2021-01-23 (×3): 220 mg via ORAL
  Filled 2021-01-21 (×3): qty 1

## 2021-01-21 MED ORDER — BISMUTH SUBSALICYLATE 262 MG/15ML PO SUSP
30.0000 mL | ORAL | Status: DC | PRN
  Administered 2021-01-21: 30 mL via ORAL
  Filled 2021-01-21: qty 118

## 2021-01-21 MED ORDER — ENOXAPARIN SODIUM 150 MG/ML IJ SOSY: 1.0000 mg/kg | PREFILLED_SYRINGE | Freq: Two times a day (BID) | INTRAMUSCULAR | Status: DC

## 2021-01-21 MED ORDER — ACETAMINOPHEN 650 MG RE SUPP: 650.0000 mg | Freq: Four times a day (QID) | RECTAL | Status: DC | PRN

## 2021-01-21 MED ORDER — VANCOMYCIN HCL 2000 MG/400ML IV SOLN
2000.0000 mg | Freq: Once | INTRAVENOUS | Status: AC
  Administered 2021-01-21: 2000 mg via INTRAVENOUS
  Filled 2021-01-21 (×2): qty 400

## 2021-01-21 MED ORDER — GUAIFENESIN-DM 100-10 MG/5ML PO SYRP: 10.0000 mL | ORAL_SOLUTION | ORAL | Status: DC | PRN

## 2021-01-21 MED ORDER — SODIUM CHLORIDE 0.9 % IV SOLN: 200.0000 mg | Freq: Once | INTRAVENOUS | Status: DC

## 2021-01-21 MED ORDER — INSULIN ASPART 100 UNIT/ML IJ SOLN
0.0000 [IU] | Freq: Three times a day (TID) | INTRAMUSCULAR | Status: DC
Start: 2021-01-21 — End: 2021-01-24
  Administered 2021-01-21: 7 [IU] via SUBCUTANEOUS
  Administered 2021-01-21 – 2021-01-22 (×2): 4 [IU] via SUBCUTANEOUS
  Administered 2021-01-22: 3 [IU] via SUBCUTANEOUS
  Administered 2021-01-22: 7 [IU] via SUBCUTANEOUS
  Administered 2021-01-23 (×3): 4 [IU] via SUBCUTANEOUS

## 2021-01-21 MED ORDER — SODIUM CHLORIDE 0.9 % IV SOLN
2.0000 g | INTRAVENOUS | Status: DC
  Administered 2021-01-21 – 2021-01-22 (×2): 2 g via INTRAVENOUS
  Filled 2021-01-21 (×2): qty 20

## 2021-01-21 MED ORDER — ENOXAPARIN SODIUM 150 MG/ML IJ SOSY: 1.0000 mg/kg | PREFILLED_SYRINGE | Freq: Once | INTRAMUSCULAR | Status: DC

## 2021-01-21 MED ORDER — FUROSEMIDE 40 MG PO TABS
40.0000 mg | ORAL_TABLET | Freq: Every day | ORAL | Status: DC
  Administered 2021-01-21 – 2021-01-23 (×3): 40 mg via ORAL
  Filled 2021-01-21 (×3): qty 1

## 2021-01-21 MED ORDER — INSULIN GLARGINE 100 UNIT/ML ~~LOC~~ SOLN
25.0000 [IU] | Freq: Every day | SUBCUTANEOUS | Status: DC
  Administered 2021-01-21 – 2021-01-23 (×3): 25 [IU] via SUBCUTANEOUS
  Filled 2021-01-21 (×5): qty 0.25

## 2021-01-21 MED ORDER — SODIUM CHLORIDE 0.9 % IV SOLN
100.0000 mg | INTRAVENOUS | Status: DC
  Filled 2021-01-21 (×2): qty 20

## 2021-01-21 MED ORDER — ONDANSETRON HCL 4 MG/2ML IJ SOLN: 4.0000 mg | Freq: Four times a day (QID) | INTRAMUSCULAR | Status: DC | PRN

## 2021-01-21 MED ORDER — VANCOMYCIN HCL IN DEXTROSE 1-5 GM/200ML-% IV SOLN: 1000.0000 mg | Freq: Once | INTRAVENOUS | Status: DC

## 2021-01-21 MED ORDER — VANCOMYCIN HCL 1500 MG/300ML IV SOLN
1500.0000 mg | INTRAVENOUS | Status: DC
  Administered 2021-01-22 – 2021-01-23 (×2): 1500 mg via INTRAVENOUS
  Filled 2021-01-21 (×2): qty 300

## 2021-01-21 MED ORDER — SODIUM CHLORIDE 0.9 % IV SOLN
100.0000 mg | Freq: Every day | INTRAVENOUS | Status: DC
  Filled 2021-01-21 (×2): qty 20

## 2021-01-21 MED ORDER — CARVEDILOL 3.125 MG PO TABS: 3.1250 mg | ORAL_TABLET | Freq: Two times a day (BID) | ORAL | Status: DC

## 2021-01-21 MED ORDER — ACETAMINOPHEN 325 MG PO TABS: 650.0000 mg | ORAL_TABLET | Freq: Four times a day (QID) | ORAL | Status: DC | PRN

## 2021-01-21 MED ORDER — ONDANSETRON HCL 4 MG PO TABS: 4.0000 mg | ORAL_TABLET | Freq: Four times a day (QID) | ORAL | Status: DC | PRN

## 2021-01-21 MED ORDER — SODIUM CHLORIDE 0.9 % IV SOLN: 100.0000 mg | Freq: Every day | INTRAVENOUS | Status: DC

## 2021-01-21 MED ORDER — ASCORBIC ACID 500 MG PO TABS
500.0000 mg | ORAL_TABLET | Freq: Every day | ORAL | Status: DC
  Administered 2021-01-21 – 2021-01-23 (×3): 500 mg via ORAL
  Filled 2021-01-21 (×3): qty 1

## 2021-01-21 NOTE — Progress Notes (Signed)
[facility] night shift telemetry coverage note.  The nursing staff reported that the patient had multiple episodes of loose stools.  C. difficile toxin was requested and Pepto-Bismol was ordered.  Sanda Klein, MD.

## 2021-01-21 NOTE — Progress Notes (Signed)
Patient admitted to the hospital earlier this morning by Dr. Robb Matar  Patient seen and examined.  Denies any shortness of breath or cough.  Denies any pain in her foot.  Reports that she had a large blister on her foot that burst prior to coming to the hospital.  She feels that overall swelling is better.  She continues to have erythema and callus of her right foot.  This erythema does not appear to be extending beyond the margins that were marked out on admission.  She is quite frustrated with Sam that in the room to create negative pressure since she has tested positive for COVID.  She states she is unable to sleep due to the noise of the fan.  Assessment/plan:  Cellulitis of right foot with underlying abscess -Patient is noted to have purulent drainage from her wound on right foot She has chronic Charcot deformity of this foot] -Plain films did not show any underlying osteomyelitis -She is currently on ceftriaxone -Would add vancomycin since she does have purulence -Purulent fluid is able to be expressed from her wound -If she does not have significant improvement, would need to consider MRI  COVID-19 virus infection -Appears to be asymptomatic at this time -Patient does not believe she tested positive for COVID since she does not have any symptoms -She is quite frustrated that she has to be in the room with a ventilation fan -We will continue to monitor for any developing symptoms -Although she does not wish to receive any targeted treatments -Although no approved indication for COVID-19, patient is only willing to take ivermectin  Chronic kidney disease stage IIIb -Creatinine is currently at baseline -Continue to monitor  Diabetes -Continued on Lantus and sliding scale insulin -Continue to monitor blood sugars  Chronic diastolic congestive heart failure -Currently appears compensated -Continue home dose of Lasix  Hypertension -She reports she is chronically on losartan, would  hold for now due to elevated creatinine  [employer]

## 2021-01-21 NOTE — Progress Notes (Signed)
ANTICOAGULATION CONSULT NOTE - Initial Consult  Pharmacy Consult for Lovenox Indication:  r/o VTE  Allergies  Allergen Reactions   Codeine     Rash and oral swelling    Dilantin [Phenytoin] Other (See Comments)    seizure     Patient Measurements: Height: 5' 10.75" (179.7 cm) Weight: (!) 144 kg (317 lb 7.4 oz) IBW/kg (Calculated) : 70.23  Vital Signs: Temp: 99.6 F (37.6 C) (07/08 2136) Temp Source: Oral (07/08 2136) BP: 132/56 (07/09 0030) Pulse Rate: 81 (07/09 0030)  Labs: Recent Labs    01/20/21 0004 01/21/21 0004  HGB 10.8*  --   HCT 32.6*  --   PLT 244  --   APTT 36  --   LABPROT 14.4  --   INR 1.1  --   CREATININE 1.86*  --   TROPONINIHS  --  31*    Estimated Creatinine Clearance: 46.8 mL/min (A) (by C-G formula based on SCr of 1.86 mg/dL (H)).   Medical History: Past Medical History:  Diagnosis Date   Charcot ankle, right    CKD (chronic kidney disease) stage 3, GFR 30-59 ml/min (HCC)    Diabetic Charcot foot (HCC)    Essential hypertension    Mixed hyperlipidemia    Nephrolithiasis    Neuropathy    OSA (obstructive sleep apnea)    Type 2 diabetes mellitus (HCC)     Assessment: 65yo female c/o RLE pain after long car trip, to begin LMWH while assessing for VTE.  Goal of Therapy:  Anti-Xa level 0.6-1 units/ml 4hrs after LMWH dose given Monitor platelets by anticoagulation protocol: Yes   Plan:  Lovenox 1mg /kg SQ Q12H. Monitor CBC.  , PharmD, BCPS  01/21/2021,1:31 AM

## 2021-01-21 NOTE — H&P (Signed)
History and Physical    Suzanne Tran PPJ:[id number] DOB: [birth year] DOA: 01/20/2021  PCP: Benita Stabile, MD   Patient coming from: Home.  I have personally briefly reviewed patient's old medical records in [facility]  Chief Complaint: Right foot pain.  HPI: Suzanne Tran is a 65 y.o. female with medical history significant of chronic diastolic CHF, right Charcot ankle, type II DM, peripheral neuropathy, stage III CKD, hypertension, OSA, hyperlipidemia, hypertension, nephrolithiasis, class III obesity who is coming to the emergency department due to right foot pain.  The patient stated that she just drove back 2-1/2 hours 2 days ago from [place] where she went to see her newborn grand kid and daughter.  After returning home she noticed that she has swelling in her right foot area and a new ulcer.  She denies fever, chills, rhinorrhea, sore throat, dyspnea, wheezing or hemoptysis.  No chest pain, palpitations, diaphoresis, PND or orthopnea.  She gets frequent lower extremity edema.  Denied abdominal pain, nausea, vomiting, diarrhea, constipation, melena or hematochezia.  No dysuria, frequency or hematuria.  No polyuria, polydipsia, polyphagia or blurred vision.  ED Course: Initial vital signs were temperature 99.6 F, pulse 92, respirations 20, BP 131/58 mmHg and O2 sat 95% on room air.  The patient received 2 g of Rocephin in the emergency department.  Lab work: Her CBC showed a white count of 7.5, hemoglobin 10.8 g/dL and platelets 580.  Normal PT/INR/PTT.  Lactic acid was 0.9 mmol/L twice.  Troponin was 31 and then 32 ng/L.  D-dimer 1.81 mcg/mL.  Fibrinogen was 552 mg/dL.  CMP showed a sodium 130 mmol/L, all other electrolytes were normal when calcium was corrected to an albumin of 3.4 g/dL.  Glucose 176, BUN 48, creatinine 1.86 mg/dL.  The rest of the hepatic functions were normal.  Imaging: A portable 1 view chest radiograph showed a small left effusion and left basilar atelectasis.   Right foot x-ray showed chronic degenerative changes without any acute bony abnormality.  There is soft tissue swelling inferior laterally without focal ulceration.  Please see images and full radiology report for further detail.  Review of Systems: As per HPI otherwise all other systems reviewed and are negative.  Past Medical History:  Diagnosis Date   Charcot ankle, right    CKD (chronic kidney disease) stage 3, GFR 30-59 ml/min (HCC) Class III obesity Chronic diastolic heart failure    Diabetic Charcot foot (HCC)    Essential hypertension    Mixed hyperlipidemia    Nephrolithiasis    Neuropathy    OSA (obstructive sleep apnea)    Type 2 diabetes mellitus (HCC)    Past Surgical History:  Procedure Laterality Date   ANKLE SURGERY     KNEE SURGERY     REPLACEMENT TOTAL KNEE     TONSILLECTOMY     Social History  reports that she has never smoked. She has never used smokeless tobacco. She reports that she does not drink alcohol and does not use drugs.  Allergies  Allergen Reactions   Codeine     Rash and oral swelling    Dilantin [Phenytoin] Other (See Comments)    seizure    Family History  Problem Relation Age of Onset   Diabetes Mellitus II Mother    Cancer Mother    Hypertension Father    CAD Father    Diabetes Mellitus II Father    Diabetes Sister    Diabetes Brother    Prior to Admission medications  Medication Sig Start Date End Date Taking? Authorizing Provider  albuterol (VENTOLIN HFA) 108 (90 Base) MCG/ACT inhaler Inhale 2 puffs into the lungs every 6 (six) hours as needed.  11/04/19   [provider]  ascorbic acid (VITAMIN C) 250 MG tablet Take by mouth.    [provider]  aspirin EC 81 MG tablet Take 1 tablet (81 mg total) by mouth daily. Swallow whole. 01/07/20   Strader, Lennart PallBrittany M, PA-C  B Complex Vitamins (VITAMIN B-COMPLEX) TABS Take by mouth.    [provider]  benzonatate (TESSALON) 200 MG capsule Take 1 capsule (200  mg total) by mouth 3 (three) times daily. 03/31/20   Lorin Glassahal, Binaya, MD  carvedilol (COREG) 3.125 MG tablet Take 1 tablet (3.125 mg total) by mouth 2 (two) times daily with a meal. 03/31/20   Dahal, Melina SchoolsBinaya, MD  furosemide (LASIX) 20 MG tablet Take 2 tablets (40 mg total) by mouth daily. 03/31/20   Dahal, Melina SchoolsBinaya, MD  guaiFENesin-dextromethorphan (ROBITUSSIN DM) 100-10 MG/5ML syrup Take 5 mLs by mouth every 4 (four) hours as needed for cough. 03/31/20   Dahal, Melina SchoolsBinaya, MD  insulin aspart (NOVOLOG) 100 UNIT/ML injection Inject 0-5 Units into the skin at bedtime. 03/31/20   Dahal, Melina SchoolsBinaya, MD  insulin aspart (NOVOLOG) 100 UNIT/ML injection Inject 0-9 Units into the skin 3 (three) times daily with meals. 03/31/20   Dahal, Melina SchoolsBinaya, MD  insulin glargine (LANTUS) 100 UNIT/ML injection Inject 0.25 mLs (25 Units total) into the skin daily. 04/01/20   Dahal, Melina SchoolsBinaya, MD  levocetirizine (XYZAL) 5 MG tablet Take 1 tablet (5 mg total) by mouth at bedtime. 03/31/20   Dahal, Melina SchoolsBinaya, MD  potassium chloride (KLOR-CON) 10 MEQ tablet Take 10 mEq by mouth daily. 01/27/20   [provider]  saccharomyces boulardii (FLORASTOR) 250 MG capsule Take 1 capsule (250 mg total) by mouth 2 (two) times daily. 03/31/20   Lorin Glassahal, Binaya, MD    Physical Exam: Vitals:   01/20/21 2200 01/20/21 2300 01/21/21 0014 01/21/21 0030  BP: (!) 165/62 (!) 157/58 135/68 (!) 132/56  Pulse: 92 88 87 81  Resp:  18 16 (!) 22  Temp:      TempSrc:      SpO2: 95% 94% 95% 97%  Weight:      Height:       Constitutional: NAD, calm, comfortable Eyes: PERRL, lids and conjunctivae normal ENMT: Mucous membranes are moist. Posterior pharynx clear of any exudate or lesions. Neck: normal, supple, no masses, no thyromegaly Respiratory: clear to auscultation bilaterally, no wheezing, no crackles. Normal respiratory effort. No accessory muscle use.  Cardiovascular: Regular rate and rhythm, no murmurs / rubs / gallops.  Stage II lymphedema trace bilateral lower  extremity pitting edema. 2+ pedal pulses. No carotid bruits.  Abdomen: Obese, no distention.  Bowel sounds positive. Soft, no tenderness, no masses palpated. No hepatosplenomegaly.  Musculoskeletal: no clubbing / cyanosis. No joint deformity upper and lower extremities. Good ROM, no contractures. Normal muscle tone.  Skin: Right foot ulcer with calor, TTP, surrounding erythema and edema.  Please see pictures below. Neurologic: CN 2-12 grossly intact. Sensation intact, DTR normal. Strength 5/5 in all 4.  Psychiatric: Normal judgment and insight. Alert and oriented x 3. Normal mood.          Labs on Admission: I have personally reviewed following labs and imaging studies  CBC: Recent Labs  Lab 01/20/21 0004  WBC 7.5  NEUTROABS 5.0  HGB 10.8*  HCT 32.6*  MCV 88.8  PLT  244    Basic Metabolic Panel: Recent Labs  Lab 01/20/21 0004  NA 130*  K 3.9  CL 98  CO2 24  GLUCOSE 176*  BUN 48*  CREATININE 1.86*  CALCIUM 8.6*    GFR: Estimated Creatinine Clearance: 46.8 mL/min (A) (by C-G formula based on SCr of 1.86 mg/dL (H)).  Liver Function Tests: Recent Labs  Lab 01/20/21 0004  AST 16  ALT 14  ALKPHOS 55  BILITOT 0.5  PROT 7.2  ALBUMIN 3.4*   Radiological Exams on Admission: DG Chest Port 1 View  Result Date: 01/21/2021 CLINICAL DATA:  Possible sepsis EXAM: PORTABLE CHEST 1 VIEW COMPARISON:  03/19/2020 FINDINGS: Cardiac shadow is stable. Aortic calcifications are again seen. Lungs are well aerated bilaterally. Small left effusion and left basilar atelectasis is seen. No pneumothorax is noted. No bony abnormality is seen. IMPRESSION: Small left effusion and left basilar atelectasis. Electronically Signed   By: Alcide Clever M.D.   On: 01/21/2021 00:43   DG Foot Complete Right  Result Date: 01/21/2021 CLINICAL DATA:  Lateral foot pain for 2 days, no known injury, initial encounter EXAM: RIGHT FOOT COMPLETE - 3+ VIEW COMPARISON:  08/05/2018 FINDINGS: Degenerative changes  in the tarsal metatarsal articulation are seen. Flattening of the plantar arch is noted. Some tarsal degenerative changes are noted as well. Previously seen areas of bony erosion in the cuboid are no longer present. Soft tissue swelling is seen inferiorly and laterally similar to that noted on the prior exam with less soft tissue ulceration. Small calcaneal spur is noted. No acute fracture is seen. IMPRESSION: Chronic degenerative changes without acute bony abnormality. Soft tissue swelling inferior laterally without focal ulceration. Electronically Signed   By: Alcide Clever M.D.   On: 01/21/2021 00:47    03/24/2020 Echo without IEA  IMPRESSIONS:   1. Limited study.   2. Left ventricular ejection fraction, by estimation, is approximately  40%. The left ventricle has moderately decreased function. The left  ventricle demonstrates global hypokinesis.   3. Right ventricular systolic function is normal. The right ventricular  size is normal.   4. A trivial pericardial effusion is posterior to the left ventricle.   5. The mitral valve is abnormal. Trivial mitral valve regurgitation.  Moderate mitral annular calcification.   6. The aortic valve is tricuspid. Aortic valve regurgitation is not  visualized. Mild to moderate aortic valve sclerosis/calcification is  present, without any evidence of aortic stenosis.   7. The inferior vena cava is normal in size with greater than 50%  respiratory variability, suggesting right atrial pressure of 3 mmHg.   EKG: Independently reviewed.  Vent. rate 87 BPM PR interval 168 ms QRS duration 101 ms QT/QTcB 402/484 ms P-R-T axes -12 -49 71 Sinus rhythm LAD, consider left anterior fascicular block Abnormal R-wave progression, late transition Nonspecific T abnormalities, lateral leads  Assessment/Plan Principal Problem:   Cellulitis of right lower extremity Observation/telemetry. Continue ceftriaxone 2 g IVPB every 24 hours. Continue local wound  care. Analgesics as needed. Blood glucose control.  Active Problems: Med reconciliation is pending.    COVID-19 virus infection Asymptomatic at this time. Declined remdesivir. Declined future vaccination. Stated that she would only take ivermectin.    CKD (chronic kidney disease) stage 3, GFR 30-59 ml/min (HCC) Creatinine and GFR around baseline. Monitor renal function electrolytes.    Type 2 diabetes mellitus (HCC) Carbohydrate modified diet. Continue Lantus at bedtime. CBG monitoring with RI SS.    Essential hypertension Continue furosemide 40 mg p.o.  daily. Continue carvedilol 3.125 mg p.o. twice daily. Monitor BP, renal function electrolytes.    Mixed hyperlipidemia   OSA (obstructive sleep apnea) Not on CPAP.    Normocytic anemia Monitor H&H. Transfuse as needed.    Chronic diastolic heart failure (HCC) No signs of decompensation at this time.   DVT prophylaxis: Lovenox SQ. Code Status:   Full code. Family Communication:   Disposition Plan:   Patient is from:  Home.  Anticipated DC to:  Home.  Anticipated DC date:  01/23/2021.  Anticipated DC barriers: Clinical status. Consults called:   Admission status:  Observation/telemetry.  Severity of Illness:  Bobette Mo MD [facility]  How to contact the [facility] Attending or Consulting provider 7A - 7P or covering provider during after hours 7P -7A, for this patient?   Check the care team in [facility] and look for a) attending/consulting [facility] provider listed and b) the [facility] team listed Log into [email or web address] and use [place]'s universal password to access. If you do not have the password, please contact the hospital operator. Locate the [facility] provider you are looking for under [facility] and page to a number that you can be directly reached. If you still have difficulty reaching the provider, please page the [facility] (Director on Call) for the Hospitalists listed on amion for assistance.  01/21/2021,  1:36 AM   This document was prepared using Dragon voice recognition software and may contain some unintended transcription errors.

## 2021-01-21 NOTE — Progress Notes (Signed)
Pharmacy Antibiotic Note  Suzanne Tran is a 65 y.o. female admitted on 01/20/2021 with cellulitis.  Pharmacy has been consulted for Vancomycin dosing.  Plan: Vancomycin 2000 mg IV x 1 dose. Vancomycin 1500 mg IV every 24 hours. Monitor labs, c/s, and vanco level as indicated  Height: 5\' 10"  (177.8 cm) Weight: 123 kg (271 lb 2.7 oz) IBW/kg (Calculated) : 68.5  Temp (24hrs), Avg:99.1 F (37.3 C), Min:98.4 F (36.9 C), Max:99.6 F (37.6 C)  Recent Labs  Lab 01/20/21 0004 01/21/21 0220 01/21/21 0741  WBC 7.5  --  5.6  CREATININE 1.86*  --  1.83*  LATICACIDVEN 0.9 0.9  --     Estimated Creatinine Clearance: 43.1 mL/min (A) (by C-G formula based on SCr of 1.83 mg/dL (H)).    Allergies  Allergen Reactions   Codeine     Rash and oral swelling    Coumadin [Warfarin] Anaphylaxis, Swelling and Rash   Dilantin [Phenytoin] Other (See Comments)    seizure     Antimicrobials this admission: Vanco 7/9 >>  CTX 7/8 >>    Microbiology results: 7/8 BCx: ngtd 7/8 UCx: pending   Thank you for allowing pharmacy to be a part of this patient's care.  9/8 01/21/2021 2:51 PM

## 2021-01-21 NOTE — ED Notes (Signed)
ED Provider at bedside. 

## 2021-01-22 DIAGNOSIS — I10 Essential (primary) hypertension: Secondary | ICD-10-CM

## 2021-01-22 DIAGNOSIS — U071 COVID-19: Secondary | ICD-10-CM

## 2021-01-22 DIAGNOSIS — I5032 Chronic diastolic (congestive) heart failure: Secondary | ICD-10-CM

## 2021-01-22 DIAGNOSIS — N1831 Chronic kidney disease, stage 3a: Secondary | ICD-10-CM

## 2021-01-22 LAB — C-REACTIVE PROTEIN: CRP: 5 mg/dL — ABNORMAL HIGH (ref <1.0)

## 2021-01-22 LAB — COMPREHENSIVE METABOLIC PANEL
ALT: 12 U/L (ref 0–44)
AST: 17 U/L (ref 15–41)
Albumin: 2.9 g/dL — ABNORMAL LOW (ref 3.5–5.0)
Alkaline Phosphatase: 46 U/L (ref 38–126)
Anion gap: 10 (ref 5–15)
BUN: 45 mg/dL — ABNORMAL HIGH (ref 8–23)
CO2: 22 mmol/L (ref 22–32)
Calcium: 8.2 mg/dL — ABNORMAL LOW (ref 8.9–10.3)
Chloride: 102 mmol/L (ref 98–111)
Creatinine, Ser: 1.72 mg/dL — ABNORMAL HIGH (ref 0.44–1.00)
GFR, Estimated: 32 mL/min — ABNORMAL LOW (ref >60)
Glucose, Bld: 123 mg/dL — ABNORMAL HIGH (ref 70–99)
Potassium: 3.7 mmol/L (ref 3.5–5.1)
Sodium: 134 mmol/L — ABNORMAL LOW (ref 135–145)
Total Bilirubin: 0.2 mg/dL — ABNORMAL LOW (ref 0.3–1.2)
Total Protein: 6.5 g/dL (ref 6.5–8.1)

## 2021-01-22 LAB — CBC WITH DIFFERENTIAL/PLATELET
Abs Immature Granulocytes: 0.02 10*3/uL (ref 0.00–0.07)
Basophils Absolute: 0 10*3/uL (ref 0.0–0.1)
Basophils Relative: 1 %
Eosinophils Absolute: 0.2 10*3/uL (ref 0.0–0.5)
Eosinophils Relative: 4 %
HCT: 33.3 % — ABNORMAL LOW (ref 36.0–46.0)
Hemoglobin: 10.6 g/dL — ABNORMAL LOW (ref 12.0–15.0)
Immature Granulocytes: 0 %
Lymphocytes Relative: 29 %
Lymphs Abs: 1.3 10*3/uL (ref 0.7–4.0)
MCH: 28.6 pg (ref 26.0–34.0)
MCHC: 31.8 g/dL (ref 30.0–36.0)
MCV: 90 fL (ref 80.0–100.0)
Monocytes Absolute: 0.5 10*3/uL (ref 0.1–1.0)
Monocytes Relative: 11 %
Neutro Abs: 2.5 10*3/uL (ref 1.7–7.7)
Neutrophils Relative %: 55 %
Platelets: 235 10*3/uL (ref 150–400)
RBC: 3.7 MIL/uL — ABNORMAL LOW (ref 3.87–5.11)
RDW: 12.6 % (ref 11.5–15.5)
WBC: 4.5 10*3/uL (ref 4.0–10.5)
nRBC: 0 % (ref 0.0–0.2)

## 2021-01-22 LAB — GLUCOSE, CAPILLARY
Glucose-Capillary: 100 mg/dL — ABNORMAL HIGH (ref 70–99)
Glucose-Capillary: 145 mg/dL — ABNORMAL HIGH (ref 70–99)
Glucose-Capillary: 173 mg/dL — ABNORMAL HIGH (ref 70–99)
Glucose-Capillary: 86 mg/dL (ref 70–99)
Glucose-Capillary: 234 mg/dL — ABNORMAL HIGH (ref 70–99)

## 2021-01-22 LAB — D-DIMER, QUANTITATIVE: D-Dimer, Quant: 1.82 ug/mL-FEU — ABNORMAL HIGH (ref 0.00–0.50)

## 2021-01-22 LAB — FERRITIN: Ferritin: 106 ng/mL (ref 11–307)

## 2021-01-22 LAB — MAGNESIUM: Magnesium: 2 mg/dL (ref 1.7–2.4)

## 2021-01-22 LAB — PHOSPHORUS: Phosphorus: 3.4 mg/dL (ref 2.5–4.6)

## 2021-01-22 MED ORDER — DIPHENHYDRAMINE HCL 25 MG PO CAPS
25.0000 mg | ORAL_CAPSULE | Freq: Four times a day (QID) | ORAL | Status: DC | PRN
  Administered 2021-01-22 – 2021-01-23 (×2): 25 mg via ORAL
  Filled 2021-01-22 (×2): qty 1

## 2021-01-22 NOTE — Progress Notes (Signed)
PROGRESS NOTE    Suzanne Tran  ZOX:[id number] DOB: 1956-06-25 DOA: 01/20/2021 PCP: Benita Stabile, MD    Brief Narrative:  65 y/o female with history of DM type 2, HTN, peripheral neuropathy, CKD stage 3, right charcot foot, admitted with cellulitis and abscess of right foot. On admission, she did test positive for covid 19, although she has been asymptomatic. She is on IV antibiotics.   Assessment & Plan:   Principal Problem:   Cellulitis of right lower extremity Active Problems:   CKD (chronic kidney disease) stage 3, GFR 30-59 ml/min (HCC)   Type 2 diabetes mellitus (HCC)   Essential hypertension   Mixed hyperlipidemia   OSA (obstructive sleep apnea)   Normocytic anemia   Chronic diastolic heart failure (HCC)   COVID-19 virus infection   Cellulitis   Cellulitis of right foot with underlying abscess -Patient is noted to have purulent drainage from her wound on right foot She has chronic Charcot deformity of this foot] -Plain films did not show any underlying osteomyelitis -She is currently on ceftriaxone -Vancomycin added since she does have purulence -Purulent fluid is able to be expressed from her wound, culture in process -checking MRI for any deep tissue infection   COVID-19 virus infection -Appears to be asymptomatic at this time -Patient does not believe she tested positive for COVID since she does not have any symptoms -She is quite frustrated that she has to be in the room with a ventilation fan -We will continue to monitor for any developing symptoms -She does not wish to receive any treatments for covid based on hospital protocol -Although not approved for treatment of COVID-19, patient says she is only willing to take ivermectin   Chronic kidney disease stage IIIb -Creatinine is currently at baseline -Continue to monitor   Diabetes -Continued on Lantus and sliding scale insulin -Continue to monitor blood sugars   Chronic diastolic congestive heart  failure -Currently appears compensated -Continue home dose of Lasix   Hypertension -She reports she is chronically on losartan, would hold for now due to elevated creatinine   DVT prophylaxis: SCDs, refused lovenox  Code Status: full code Family Communication: discussed with patient Disposition Plan: Status is: Inpatient  Remains inpatient appropriate because:Ongoing diagnostic testing needed not appropriate for outpatient work up, IV treatments appropriate due to intensity of illness or inability to take PO, and Inpatient level of care appropriate due to severity of illness  Dispo: The patient is from: Home              Anticipated d/c is to: Home              Patient currently is not medically stable to d/c.   Difficult to place patient No         Consultants:    Procedures:    Antimicrobials:  Ceftriaxone 7/9> Vancomycin 7/9>    Subjective: She says she got more rest after wearing some ear plugs, denies any pain in her right foot  Objective: Vitals:   01/21/21 1347 01/21/21 2053 01/22/21 0203 01/22/21 1409  BP: (!) 142/49 115/78 (!) 101/54 (!) 129/52  Pulse: 69 79 83 67  Resp: 20 19 20 20   Temp: 98.6 F (37 C) 98.5 F (36.9 C) 99.5 F (37.5 C)   TempSrc: Oral Oral Oral   SpO2: 95% 94% 97% 93%  Weight:      Height:        Intake/Output Summary (Last 24 hours) at 01/22/2021 1637 Last data filed  at 01/22/2021 0700 Gross per 24 hour  Intake 393.93 ml  Output 200 ml  Net 193.93 ml   Filed Weights   01/20/21 2137 01/21/21 0222  Weight: (!) 144 kg 123 kg    Examination:  General exam: Appears calm and comfortable  Respiratory system: Clear to auscultation. Respiratory effort normal. Cardiovascular system: S1 & S2 heard, RRR. No JVD, murmurs, rubs, gallops or clicks. No pedal edema. Gastrointestinal system: Abdomen is nondistended, soft and nontender. No organomegaly or masses felt. Normal bowel sounds heard. Central nervous system: Alert and  oriented. No focal neurological deficits. Extremities: Symmetric 5 x 5 power. Skin: right leg erythema improving, wound as below Psychiatry: Judgement and insight appear normal. Mood & affect appropriate.            Data Reviewed: I have personally reviewed following labs and imaging studies  CBC: Recent Labs  Lab 01/20/21 0004 01/21/21 0741 01/22/21 0608  WBC 7.5 5.6 4.5  NEUTROABS 5.0  --  2.5  HGB 10.8* 10.3* 10.6*  HCT 32.6* 31.0* 33.3*  MCV 88.8 90.6 90.0  PLT 244 217 235   Basic Metabolic Panel: Recent Labs  Lab 01/20/21 0004 01/21/21 0741 01/22/21 0608  NA 130* 133* 134*  K 3.9 3.6 3.7  CL 98 100 102  CO2 24 25 22   GLUCOSE 176* 108* 123*  BUN 48* 48* 45*  CREATININE 1.86* 1.83* 1.72*  CALCIUM 8.6* 8.4* 8.2*  MG  --   --  2.0  PHOS  --   --  3.4   GFR: Estimated Creatinine Clearance: 45.9 mL/min (A) (by C-G formula based on SCr of 1.72 mg/dL (H)). Liver Function Tests: Recent Labs  Lab 01/20/21 0004 01/22/21 0608  AST 16 17  ALT 14 12  ALKPHOS 55 46  BILITOT 0.5 0.2*  PROT 7.2 6.5  ALBUMIN 3.4* 2.9*   No results for input(s): LIPASE, AMYLASE in the last 168 hours. No results for input(s): AMMONIA in the last 168 hours. Coagulation Profile: Recent Labs  Lab 01/20/21 0004  INR 1.1   Cardiac Enzymes: No results for input(s): CKTOTAL, CKMB, CKMBINDEX, TROPONINI in the last 168 hours. BNP (last 3 results) No results for input(s): PROBNP in the last 8760 hours. HbA1C: No results for input(s): HGBA1C in the last 72 hours. CBG: Recent Labs  Lab 01/21/21 1623 01/21/21 2051 01/22/21 0148 01/22/21 0753 01/22/21 1135  GLUCAP 116* 212* 100* 145* 173*   Lipid Profile: No results for input(s): CHOL, HDL, LDLCALC, TRIG, CHOLHDL, LDLDIRECT in the last 72 hours. Thyroid Function Tests: No results for input(s): TSH, T4TOTAL, FREET4, T3FREE, THYROIDAB in the last 72 hours. Anemia Panel: Recent Labs    01/21/21 0004 01/22/21 0608  FERRITIN  109 106   Sepsis Labs: Recent Labs  Lab 01/20/21 0004 01/21/21 0004 01/21/21 0220  PROCALCITON  --  0.13  --   LATICACIDVEN 0.9  --  0.9    Recent Results (from the past 240 hour(s))  Blood culture (routine single)     Status: None (Preliminary result)   Collection Time: 01/20/21 12:04 AM   Specimen: BLOOD  Result Value Ref Range Status   Specimen Description BLOOD  Final   Special Requests NONE  Final   Culture   Final    NO GROWTH 1 DAY Performed at [facility], [address], [place], [place]    Report Status PENDING  Incomplete  Resp Panel by RT-PCR (Flu A&B, Covid) Nasopharyngeal Swab     Status: Abnormal  Collection Time: 01/21/21  1:43 AM   Specimen: Nasopharyngeal Swab; Nasopharyngeal(NP) swabs in vial transport medium  Result Value Ref Range Status   SARS Coronavirus 2 by RT PCR POSITIVE (A) NEGATIVE Final    Comment: RESULT CALLED TO, READ BACK BY AND VERIFIED WITH: HARRIS,B @ 0254 ON 01/21/21 BY JUW (NOTE) SARS-CoV-2 target nucleic acids are DETECTED.  The SARS-CoV-2 RNA is generally detectable in upper respiratory specimens during the acute phase of infection. Positive results are indicative of the presence of the identified virus, but do not rule out bacterial infection or co-infection with other pathogens not detected by the test. Clinical correlation with patient history and other diagnostic information is necessary to determine patient infection status. The expected result is Negative.  Fact Sheet for Patients: BloggerCourse.com  Fact Sheet for Healthcare Providers: SeriousBroker.it  This test is not yet approved or cleared by the [place] FDA and  has been authorized for detection and/or diagnosis of SARS-CoV-2 by FDA under an Emergency Use Authorization (EUA).  This EUA will remain in effect (meaning this test can be  used) for the duration of  the COVID-19 declaration under Section  564(b)(1) of the Act, 21 U.S.C. section 360bbb-3(b)(1), unless the authorization is terminated or revoked sooner.     Influenza A by PCR NEGATIVE NEGATIVE Final   Influenza B by PCR NEGATIVE NEGATIVE Final    Comment: (NOTE) The Xpert Xpress SARS-CoV-2/FLU/RSV plus assay is intended as an aid in the diagnosis of influenza from Nasopharyngeal swab specimens and should not be used as a sole basis for treatment. Nasal washings and aspirates are unacceptable for Xpert Xpress SARS-CoV-2/FLU/RSV testing.  Fact Sheet for Patients: BloggerCourse.com  Fact Sheet for Healthcare Providers: SeriousBroker.it  This test is not yet approved or cleared by the [place] FDA and has been authorized for detection and/or diagnosis of SARS-CoV-2 by FDA under an Emergency Use Authorization (EUA). This EUA will remain in effect (meaning this test can be used) for the duration of the COVID-19 declaration under Section 564(b)(1) of the Act, 21 U.S.C. section 360bbb-3(b)(1), unless the authorization is terminated or revoked.  Performed at [facility], [address], [place], [place] [address]          Radiology Studies: US Venous Img Lower Bilateral (DVT)  Result Date: 01/21/2021 CLINICAL DATA:  Bilateral lower extremity edema and right foot pain after traveling for 3 days. EXAM: BILATERAL LOWER EXTREMITY VENOUS DOPPLER ULTRASOUND TECHNIQUE: Gray-scale sonography with compression, as well as color and duplex ultrasound, were performed to evaluate the deep venous system(s) from the level of the common femoral vein through the popliteal and proximal calf veins. COMPARISON:  None. FINDINGS: VENOUS Normal compressibility of the common femoral, superficial femoral, and popliteal veins, as well as the visualized calf veins. Visualized portions of profunda femoral vein and great saphenous vein unremarkable. No filling defects to suggest DVT on grayscale or  color Doppler imaging. Doppler waveforms show normal direction of venous flow, normal respiratory plasticity and response to augmentation. OTHER 4.2 x 1.6 x 1.7 cm cystic structure in the left popliteal fossa is likely a Baker cyst. Limitations: none IMPRESSION: Negative. Electronically Signed   By: Acquanetta Belling M.D.   On: 01/21/2021 12:20   DG Chest Port 1 View  Result Date: 01/21/2021 CLINICAL DATA:  Possible sepsis EXAM: PORTABLE CHEST 1 VIEW COMPARISON:  03/19/2020 FINDINGS: Cardiac shadow is stable. Aortic calcifications are again seen. Lungs are well aerated bilaterally. Small left effusion and left basilar atelectasis is  seen. No pneumothorax is noted. No bony abnormality is seen. IMPRESSION: Small left effusion and left basilar atelectasis. Electronically Signed   By: Alcide CleverMark  Lukens M.D.   On: 01/21/2021 00:43   DG Foot Complete Right  Result Date: 01/21/2021 CLINICAL DATA:  Lateral foot pain for 2 days, no known injury, initial encounter EXAM: RIGHT FOOT COMPLETE - 3+ VIEW COMPARISON:  08/05/2018 FINDINGS: Degenerative changes in the tarsal metatarsal articulation are seen. Flattening of the plantar arch is noted. Some tarsal degenerative changes are noted as well. Previously seen areas of bony erosion in the cuboid are no longer present. Soft tissue swelling is seen inferiorly and laterally similar to that noted on the prior exam with less soft tissue ulceration. Small calcaneal spur is noted. No acute fracture is seen. IMPRESSION: Chronic degenerative changes without acute bony abnormality. Soft tissue swelling inferior laterally without focal ulceration. Electronically Signed   By: Alcide CleverMark  Lukens M.D.   On: 01/21/2021 00:47        Scheduled Meds:  vitamin C  500 mg Oral Daily   aspirin EC  81 mg Oral Daily   furosemide  40 mg Oral Daily   insulin aspart  0-20 Units Subcutaneous TID WC   insulin glargine  25 Units Subcutaneous Daily   zinc sulfate  220 mg Oral Daily   Continuous  Infusions:  cefTRIAXone (ROCEPHIN)  IV 2 g (01/21/21 2211)   vancomycin 1,500 mg (01/22/21 1528)     LOS: 1 day    Time spent: 35mins    Erick BlinksJehanzeb Rishard Delange, MD [facility]   If 7PM-7AM, please contact night-coverage [email or web address]  01/22/2021, 4:37 PM

## 2021-01-23 ENCOUNTER — Inpatient Hospital Stay (HOSPITAL_COMMUNITY): Payer: Medicare Other

## 2021-01-23 DIAGNOSIS — R600 Localized edema: Secondary | ICD-10-CM

## 2021-01-23 LAB — COMPREHENSIVE METABOLIC PANEL
ALT: 12 U/L (ref 0–44)
AST: 15 U/L (ref 15–41)
Albumin: 2.9 g/dL — ABNORMAL LOW (ref 3.5–5.0)
Alkaline Phosphatase: 47 U/L (ref 38–126)
Anion gap: 8 (ref 5–15)
BUN: 43 mg/dL — ABNORMAL HIGH (ref 8–23)
CO2: 25 mmol/L (ref 22–32)
Calcium: 8.3 mg/dL — ABNORMAL LOW (ref 8.9–10.3)
Chloride: 104 mmol/L (ref 98–111)
Creatinine, Ser: 1.63 mg/dL — ABNORMAL HIGH (ref 0.44–1.00)
GFR, Estimated: 35 mL/min — ABNORMAL LOW (ref >60)
Glucose, Bld: 178 mg/dL — ABNORMAL HIGH (ref 70–99)
Potassium: 4.2 mmol/L (ref 3.5–5.1)
Sodium: 137 mmol/L (ref 135–145)
Total Bilirubin: 0.4 mg/dL (ref 0.3–1.2)
Total Protein: 6.2 g/dL — ABNORMAL LOW (ref 6.5–8.1)

## 2021-01-23 LAB — CBC WITH DIFFERENTIAL/PLATELET
Abs Immature Granulocytes: 0.02 10*3/uL (ref 0.00–0.07)
Basophils Absolute: 0 10*3/uL (ref 0.0–0.1)
Basophils Relative: 1 %
Eosinophils Absolute: 0.3 10*3/uL (ref 0.0–0.5)
Eosinophils Relative: 9 %
HCT: 34.3 % — ABNORMAL LOW (ref 36.0–46.0)
Hemoglobin: 10.9 g/dL — ABNORMAL LOW (ref 12.0–15.0)
Immature Granulocytes: 1 %
Lymphocytes Relative: 54 %
Lymphs Abs: 1.9 10*3/uL (ref 0.7–4.0)
MCH: 28.8 pg (ref 26.0–34.0)
MCHC: 31.8 g/dL (ref 30.0–36.0)
MCV: 90.7 fL (ref 80.0–100.0)
Monocytes Absolute: 0.5 10*3/uL (ref 0.1–1.0)
Monocytes Relative: 15 %
Neutro Abs: 0.7 10*3/uL — ABNORMAL LOW (ref 1.7–7.7)
Neutrophils Relative %: 20 %
Platelets: 214 10*3/uL (ref 150–400)
RBC: 3.78 MIL/uL — ABNORMAL LOW (ref 3.87–5.11)
RDW: 12.8 % (ref 11.5–15.5)
WBC: 3.4 10*3/uL — ABNORMAL LOW (ref 4.0–10.5)
nRBC: 0 % (ref 0.0–0.2)

## 2021-01-23 LAB — D-DIMER, QUANTITATIVE: D-Dimer, Quant: 1.6 ug/mL-FEU — ABNORMAL HIGH (ref 0.00–0.50)

## 2021-01-23 LAB — FERRITIN: Ferritin: 104 ng/mL (ref 11–307)

## 2021-01-23 LAB — HEMOGLOBIN A1C
Hgb A1c MFr Bld: 6.9 % — ABNORMAL HIGH (ref 4.8–5.6)
Mean Plasma Glucose: 151 mg/dL

## 2021-01-23 LAB — C-REACTIVE PROTEIN: CRP: 2.1 mg/dL — ABNORMAL HIGH (ref <1.0)

## 2021-01-23 LAB — PHOSPHORUS: Phosphorus: 3.9 mg/dL (ref 2.5–4.6)

## 2021-01-23 LAB — MAGNESIUM: Magnesium: 2 mg/dL (ref 1.7–2.4)

## 2021-01-23 LAB — GLUCOSE, CAPILLARY
Glucose-Capillary: 175 mg/dL — ABNORMAL HIGH (ref 70–99)
Glucose-Capillary: 171 mg/dL — ABNORMAL HIGH (ref 70–99)
Glucose-Capillary: 168 mg/dL — ABNORMAL HIGH (ref 70–99)

## 2021-01-23 MED ORDER — AMLODIPINE BESYLATE 5 MG PO TABS: 5.0000 mg | ORAL_TABLET | Freq: Every day | ORAL | Status: DC

## 2021-01-23 MED ORDER — DOXYCYCLINE HYCLATE 100 MG PO CAPS: 100.0000 mg | ORAL_CAPSULE | Freq: Two times a day (BID) | ORAL | 0 refills | Status: DC

## 2021-01-23 MED ORDER — CEFDINIR 300 MG PO CAPS: 300.0000 mg | ORAL_CAPSULE | Freq: Two times a day (BID) | ORAL | 0 refills | Status: DC
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
## 2021-01-23 NOTE — Discharge Instructions (Addendum)
You will need to isolate until 01/31/21

## 2021-01-24 NOTE — Clinical Social Work Note (Signed)
Patient states she declined HH at discharge, however she now would like [facility] services. Advised to call Dr. Margo Aye to set up [facility] services.    Abigayle Wilinski, Juleen China, LCSW

## 2021-01-25 LAB — AEROBIC CULTURE W GRAM STAIN (SUPERFICIAL SPECIMEN)

## 2021-01-26 LAB — CULTURE, BLOOD (SINGLE): Culture: NO GROWTH

## 2021-02-10 DIAGNOSIS — E1122 Type 2 diabetes mellitus with diabetic chronic kidney disease: Secondary | ICD-10-CM | POA: Diagnosis not present

## 2021-02-10 DIAGNOSIS — Z79899 Other long term (current) drug therapy: Secondary | ICD-10-CM | POA: Diagnosis not present

## 2021-02-10 DIAGNOSIS — E559 Vitamin D deficiency, unspecified: Secondary | ICD-10-CM | POA: Diagnosis not present

## 2021-02-17 ENCOUNTER — Other Ambulatory Visit (HOSPITAL_COMMUNITY): Payer: Self-pay | Admitting: Nephrology

## 2021-02-17 DIAGNOSIS — I129 Hypertensive chronic kidney disease with stage 1 through stage 4 chronic kidney disease, or unspecified chronic kidney disease: Secondary | ICD-10-CM | POA: Diagnosis not present

## 2021-02-17 DIAGNOSIS — E1129 Type 2 diabetes mellitus with other diabetic kidney complication: Secondary | ICD-10-CM | POA: Diagnosis not present

## 2021-02-17 DIAGNOSIS — E559 Vitamin D deficiency, unspecified: Secondary | ICD-10-CM | POA: Diagnosis not present

## 2021-02-17 DIAGNOSIS — R809 Proteinuria, unspecified: Secondary | ICD-10-CM | POA: Diagnosis not present

## 2021-02-17 DIAGNOSIS — N189 Chronic kidney disease, unspecified: Secondary | ICD-10-CM | POA: Diagnosis not present

## 2021-02-17 DIAGNOSIS — N2889 Other specified disorders of kidney and ureter: Secondary | ICD-10-CM

## 2021-02-17 DIAGNOSIS — E790 Hyperuricemia without signs of inflammatory arthritis and tophaceous disease: Secondary | ICD-10-CM | POA: Diagnosis not present

## 2021-02-17 DIAGNOSIS — I5022 Chronic systolic (congestive) heart failure: Secondary | ICD-10-CM | POA: Diagnosis not present

## 2021-02-17 DIAGNOSIS — E211 Secondary hyperparathyroidism, not elsewhere classified: Secondary | ICD-10-CM | POA: Diagnosis not present

## 2021-02-17 DIAGNOSIS — R768 Other specified abnormal immunological findings in serum: Secondary | ICD-10-CM | POA: Diagnosis not present

## 2021-02-17 DIAGNOSIS — E1122 Type 2 diabetes mellitus with diabetic chronic kidney disease: Secondary | ICD-10-CM | POA: Diagnosis not present

## 2021-03-01 ENCOUNTER — Ambulatory Visit (HOSPITAL_COMMUNITY): Payer: Medicaid Other

## 2021-03-03 ENCOUNTER — Ambulatory Visit (HOSPITAL_COMMUNITY)
Admission: RE | Admit: 2021-03-03 | Discharge: 2021-03-03 | Disposition: A | Discharge status: Home / Self Care | Payer: Medicare Other | Source: Ambulatory Visit | Attending: Nephrology | Admitting: Nephrology

## 2021-03-03 ENCOUNTER — Other Ambulatory Visit: Payer: Self-pay

## 2021-03-03 DIAGNOSIS — N2889 Other specified disorders of kidney and ureter: Secondary | ICD-10-CM

## 2021-03-03 MED ORDER — GADOBUTROL 1 MMOL/ML IV SOLN
7.0000 mL | Freq: Once | INTRAVENOUS | Status: AC | PRN
  Administered 2021-03-03: 7 mL via INTRAVENOUS

## 2021-03-24 DIAGNOSIS — I5022 Chronic systolic (congestive) heart failure: Secondary | ICD-10-CM | POA: Diagnosis not present

## 2021-03-24 DIAGNOSIS — E211 Secondary hyperparathyroidism, not elsewhere classified: Secondary | ICD-10-CM | POA: Diagnosis not present

## 2021-03-24 DIAGNOSIS — E1122 Type 2 diabetes mellitus with diabetic chronic kidney disease: Secondary | ICD-10-CM | POA: Diagnosis not present

## 2021-03-24 DIAGNOSIS — N189 Chronic kidney disease, unspecified: Secondary | ICD-10-CM | POA: Diagnosis not present

## 2021-03-24 DIAGNOSIS — I129 Hypertensive chronic kidney disease with stage 1 through stage 4 chronic kidney disease, or unspecified chronic kidney disease: Secondary | ICD-10-CM | POA: Diagnosis not present

## 2021-03-24 DIAGNOSIS — N2889 Other specified disorders of kidney and ureter: Secondary | ICD-10-CM | POA: Diagnosis not present

## 2021-03-24 DIAGNOSIS — R809 Proteinuria, unspecified: Secondary | ICD-10-CM | POA: Diagnosis not present

## 2021-03-24 DIAGNOSIS — E1129 Type 2 diabetes mellitus with other diabetic kidney complication: Secondary | ICD-10-CM | POA: Diagnosis not present

## 2021-03-24 DIAGNOSIS — N1339 Other hydronephrosis: Secondary | ICD-10-CM | POA: Diagnosis not present

## 2021-04-19 ENCOUNTER — Ambulatory Visit (INDEPENDENT_AMBULATORY_CARE_PROVIDER_SITE_OTHER): Payer: Medicare Other | Admitting: Urology

## 2021-04-19 ENCOUNTER — Encounter: Payer: Self-pay | Admitting: Urology

## 2021-04-19 ENCOUNTER — Other Ambulatory Visit: Payer: Self-pay

## 2021-04-19 VITALS — BP 155/79 | HR 88 | Ht 69.5 in | Wt 283.1 lb

## 2021-04-19 DIAGNOSIS — N2889 Other specified disorders of kidney and ureter: Secondary | ICD-10-CM

## 2021-04-19 NOTE — Addendum Note (Signed)
Addended by: Gustavus Messing on: 04/19/2021 03:26 PM   Modules accepted: Orders

## 2021-04-19 NOTE — Patient Instructions (Signed)
Renal Mass  A renal mass is an abnormal growth in the kidney. It may be found while performing an MRI, CT scan, or ultrasound to evaluate other problems of the abdomen. A renal mass that is cancerous (malignant) may grow or spread quickly. Others are not cancerous (benign). Renal masses include: Tumors. These may be malignant or benign. The most common type of kidney cancer in adults is renal cell carcinoma. In children, the most common type of kidney cancer is Wilms tumor. The most common benign tumors of the kidney include renal adenomas, oncocytomas, and angiomyolipoma (AML). Cysts. These are fluid-filled sacs that form on or in the kidney. What are the causes? Certain types of cancers, infections, or injuries can cause a renal mass. It isnot always known what causes a cyst to develop in or on the kidney. What are the signs or symptoms? Often, a renal mass does not cause any signs or symptoms; most kidney cysts donot cause symptoms. How is this diagnosed? Your health care provider may recommend tests to diagnose the cause of your renal mass. These tests may be done if a renal mass is found: Physical exam. Blood tests. Urine tests. Imaging tests, such as ultrasound, CT scan, or MRI. Biopsy. This is a small sample that is removed from the renal mass and tested in a lab. The exact tests and how often they are done will depend on: The size and appearance of the renal mass. Risk factors or medical conditions that increase your risk for problems. Any symptoms associated with the renal mass, or concerns that you have about it. Tests and physical exams may be done once, or they may be done regularly for a period of time. Tests and exams that are done regularly will help monitorwhether the mass is growing and beginning to cause problems. How is this treated? Treatment is not always needed for this condition. Your health care provider may recommend careful monitoring and regular tests and exams.  Treatment willdepend on the cause of the mass. Treatment for a cancerous renal mass may include surgical removal,chemotherapy, radiation, or immunotherapy. Most kidney cysts do not need to be treated. Follow these instructions at home: What you need to do at home will depend on the cause of the mass. Follow the instructions that your health care provider gives to you. In general: Take over-the-counter and prescription medicines only as told by your health care provider. If you were prescribed an antibiotic medicine, take it as told by your health care provider. Do not stop taking the antibiotic even if you start to feel better. Follow any restrictions that are given to you by your health care provider. Keep all follow-up visits. This is important. You may need to see your health care provider once or twice a year to have CT scans and ultrasounds. These tests will show if your renal mass has changed or grown. Contact a health care provider if you: Have pain in your side or back (flank pain). Have a fever. Feel full soon after eating. Have pain or swelling in the abdomen. Lose weight. Get help right away if: Your pain gets worse. There is blood in your urine. You cannot urinate. You have chest pain. You have trouble breathing. These symptoms may represent a serious problem that is an emergency. Do not wait to see if the symptoms will go away. Get medical help right away. Call your local emergency services (911 in the [place] Summary A renal mass is an abnormal growth in the kidney.   It may be cancerous (malignant) and grow or spread quickly, or it may not be cancerous (benign). Renal masses often do not have any signs or symptoms. Renal masses may be found while performing an MRI, CT scan, or ultrasound for other problems of the abdomen. Your health care provider may recommend that you have tests to diagnose the cause of your renal mass. These may include a physical exam, blood tests, urine  tests, imaging, or a biopsy. Treatment is not always needed for this condition. Careful monitoring may be recommended. This information is not intended to replace advice given to you by your health care provider. Make sure you discuss any questions you have with your healthcare provider. Document Revised: 12/28/2019 Document Reviewed: 12/28/2019 Elsevier Patient Education  2022 Elsevier Inc.  

## 2021-04-19 NOTE — Progress Notes (Signed)
04/19/2021 3:15 PM   Suzanne Tran 16-Mar-1956 [id number]  Referring provider: Randa Lynn, MD [phone] ,  [place] [address]  Left renal mass   HPI: Ms Suzanne Tran is a 8OB here for evaluation of a left renal mass. She has a history for CKD3 and underwent renal US with Dr. Wolfgang Phoenix and was found to have a left posterior renal mass. She underwent MRI 03/03/2021 showed a 2.7cm left heterogenous renal mass concerning for malignancy. She has a atrophic left kidney with left moderate hydronephrosis.  She denies any flank pain. She has a hx of nephrolithiasis 25 years ago and had a stent placed. She has a hx of DMII and has a right diabetic foot wound. She denies any significant.    PMH: Past Medical History:  Diagnosis Date   Charcot ankle, right    CKD (chronic kidney disease) stage 3, GFR 30-59 ml/min (HCC)    Diabetic Charcot foot (HCC)    Essential hypertension    Mixed hyperlipidemia    Nephrolithiasis    Neuropathy    OSA (obstructive sleep apnea)    Type 2 diabetes mellitus (HCC)     Surgical History: Past Surgical History:  Procedure Laterality Date   ANKLE SURGERY     KNEE SURGERY     REPLACEMENT TOTAL KNEE     TONSILLECTOMY      Home Medications:  Allergies as of 04/19/2021       Reactions   Codeine    Rash and oral swelling   Coumadin [warfarin] Anaphylaxis, Swelling, Rash   Dilantin [phenytoin] Other (See Comments)   seizure        Medication List        Accurate as of April 19, 2021  3:15 PM. If you have any questions, ask your nurse or doctor.          albuterol 108 (90 Base) MCG/ACT inhaler Commonly known as: VENTOLIN HFA Inhale 2 puffs into the lungs every 6 (six) hours as needed.   ascorbic acid 250 MG tablet Commonly known as: VITAMIN C Take 500 mg by mouth daily.   aspirin EC 81 MG tablet Take 1 tablet (81 mg total) by mouth daily. Swallow whole.   brimonidine 0.2 % ophthalmic solution Commonly known as:  ALPHAGAN Place 1 drop into both eyes in the morning and at bedtime.   carvedilol 3.125 MG tablet Commonly known as: COREG Take 1 tablet (3.125 mg total) by mouth 2 (two) times daily with a meal.   cefdinir 300 MG capsule Commonly known as: OMNICEF Take 1 capsule (300 mg total) by mouth 2 (two) times daily.   doxycycline 100 MG capsule Commonly known as: VIBRAMYCIN Take 1 capsule (100 mg total) by mouth 2 (two) times daily.   FreeStyle Libre 2 Sensor Misc Apply topically.   furosemide 20 MG tablet Commonly known as: LASIX Take 2 tablets (40 mg total) by mouth daily. What changed: how much to take   insulin glargine 100 UNIT/ML injection Commonly known as: LANTUS Inject 0.25 mLs (25 Units total) into the skin daily.   insulin lispro 100 UNIT/ML KwikPen Commonly known as: HUMALOG Inject 5-10 Units into the skin 3 (three) times daily. Sliding scale   levocetirizine 5 MG tablet Commonly known as: XYZAL Take 1 tablet (5 mg total) by mouth at bedtime.   losartan 25 MG tablet Commonly known as: COZAAR Take 25 mg by mouth daily.   Misc Intestinal Flora Regulat Caps Take 1 capsule by mouth  daily.   potassium chloride 10 MEQ tablet Commonly known as: KLOR-CON Take 10 mEq by mouth daily.   Super Thera Vite M Tabs Take 1 tablet by mouth daily.   Vitamin B-Complex Tabs Take by mouth.   Vitamin D (Ergocalciferol) 1.25 MG (50000 UNIT) Caps capsule Commonly known as: DRISDOL Take 50,000 Units by mouth once a week.        Allergies:  Allergies  Allergen Reactions   Codeine     Rash and oral swelling    Coumadin [Warfarin] Anaphylaxis, Swelling and Rash   Dilantin [Phenytoin] Other (See Comments)    seizure     Family History: Family History  Problem Relation Age of Onset   Diabetes Mellitus II Mother    Cancer Mother    Hypertension Father    CAD Father    Diabetes Mellitus II Father    Diabetes Sister    Diabetes Brother     Social History:  reports  that she has never smoked. She has never used smokeless tobacco. She reports that she does not drink alcohol and does not use drugs.  ROS: All other review of systems were reviewed and are negative except what is noted above in HPI  Physical Exam: BP (!) 155/79   Pulse 88   Ht 5' 9.5" (1.765 m)   Wt 283 lb 2 oz (128.4 kg)   BMI 41.21 kg/m   Constitutional:  Alert and oriented, No acute distress. HEENT: [place] AT, moist mucus membranes.  Trachea midline, no masses. Cardiovascular: No clubbing, cyanosis, or edema. Respiratory: Normal respiratory effort, no increased work of breathing. GI: Abdomen is soft, nontender, nondistended, no abdominal masses GU: No CVA tenderness.  Lymph: No cervical or inguinal lymphadenopathy. Skin: No rashes, bruises or suspicious lesions. Neurologic: Grossly intact, no focal deficits, moving all 4 extremities. Psychiatric: Normal mood and affect.  Laboratory Data: Lab Results  Component Value Date   WBC 3.4 (L) 01/23/2021   HGB 10.9 (L) 01/23/2021   HCT 34.3 (L) 01/23/2021   MCV 90.7 01/23/2021   PLT 214 01/23/2021    Lab Results  Component Value Date   CREATININE 1.63 (H) 01/23/2021    No results found for: PSA  No results found for: TESTOSTERONE  Lab Results  Component Value Date   HGBA1C 6.9 (H) 01/21/2021    Urinalysis    Component Value Date/Time   COLORURINE AMBER (A) 03/27/2020 1235   APPEARANCEUR CLOUDY (A) 03/27/2020 1235   LABSPEC 1.013 03/27/2020 1235   PHURINE 5.0 03/27/2020 1235   GLUCOSEU NEGATIVE 03/27/2020 1235   HGBUR LARGE (A) 03/27/2020 1235   BILIRUBINUR NEGATIVE 03/27/2020 1235   KETONESUR NEGATIVE 03/27/2020 1235   PROTEINUR 100 (A) 03/27/2020 1235   NITRITE NEGATIVE 03/27/2020 1235   LEUKOCYTESUR MODERATE (A) 03/27/2020 1235    Lab Results  Component Value Date   BACTERIA MANY (A) 03/27/2020    Pertinent Imaging: MRI 02/2021: Images reviewed and discussed with the patient No results found for this or  any previous visit.  Results for orders placed during the hospital encounter of 01/20/21  US Venous Img Lower Bilateral (DVT)  Narrative CLINICAL DATA:  Bilateral lower extremity edema and right foot pain after traveling for 3 days.  EXAM: BILATERAL LOWER EXTREMITY VENOUS DOPPLER ULTRASOUND  TECHNIQUE: Gray-scale sonography with compression, as well as color and duplex ultrasound, were performed to evaluate the deep venous system(s) from the level of the common femoral vein through the popliteal and proximal calf veins.  COMPARISON:  None.  FINDINGS: VENOUS  Normal compressibility of the common femoral, superficial femoral, and popliteal veins, as well as the visualized calf veins. Visualized portions of profunda femoral vein and great saphenous vein unremarkable. No filling defects to suggest DVT on grayscale or color Doppler imaging. Doppler waveforms show normal direction of venous flow, normal respiratory plasticity and response to augmentation.  OTHER  4.2 x 1.6 x 1.7 cm cystic structure in the left popliteal fossa is likely a Baker cyst.  Limitations: none  IMPRESSION: Negative.   Electronically Signed By: Acquanetta Belling M.D. On: 01/21/2021 12:20  No results found for this or any previous visit.  No results found for this or any previous visit.  Results for orders placed during the hospital encounter of 12/28/20  US RENAL  Narrative CLINICAL DATA:  Chronic kidney disease.  Type 2 diabetes.  EXAM: RENAL / URINARY TRACT ULTRASOUND COMPLETE  COMPARISON:  03/29/2020  FINDINGS: Right Kidney:  Renal measurements: 12.4 x 7.2 x 6.7 cm = volume: 312 mL. Echogenicity within normal limits. No mass or hydronephrosis visualized.  Left Kidney:  Renal measurements: 12.6 x 4.2 x 4.4 cm = volume: 120 ML. Diffuse cortical thinning. 3 x 2.6 x 2.7 cm exophytic entity at the lower pole, enlarged from measurement of 18 mm 20 CT of 03/19/2020. Echogenicity  within normal limits. No hydronephrosis.  Bladder:  Appears normal for degree of bladder distention.  Other:  None.  IMPRESSION: Enlargement of both kidneys since the study of last year, consistent with diabetic glomerular nephropathy. Left kidney is atrophic relative to the right. No obstruction.  Enlarging exophytic entity at the lower pole of the left kidney, now measuring up to 3 cm in diameter. This is indeterminate by ultrasound and could be an enlarging cyst or mass. Renal MRI would be the best way to evaluate this further.   Electronically Signed By: Paulina Fusi M.D. On: 12/29/2020 10:22  No results found for this or any previous visit.  No results found for this or any previous visit.  No results found for this or any previous visit.   Assessment & Plan:    1. Renal mass We discussed the natural hx of renal masses and the 80/20 malignant/benign likelihood. We disucssed the treatment options including active surveillance. Renal ablation, partial and radical nephrectomy. After discussing the options the patient elects for surveillance. RTC 6 months with renal US   No follow-ups on file.  Wilkie Aye, MD  [facility] [place]

## 2021-05-19 DIAGNOSIS — E559 Vitamin D deficiency, unspecified: Secondary | ICD-10-CM | POA: Diagnosis not present

## 2021-05-19 DIAGNOSIS — D509 Iron deficiency anemia, unspecified: Secondary | ICD-10-CM | POA: Diagnosis not present

## 2021-05-19 DIAGNOSIS — E1169 Type 2 diabetes mellitus with other specified complication: Secondary | ICD-10-CM | POA: Diagnosis not present

## 2021-05-31 DIAGNOSIS — E211 Secondary hyperparathyroidism, not elsewhere classified: Secondary | ICD-10-CM | POA: Diagnosis not present

## 2021-05-31 DIAGNOSIS — E559 Vitamin D deficiency, unspecified: Secondary | ICD-10-CM | POA: Diagnosis not present

## 2021-05-31 DIAGNOSIS — E1129 Type 2 diabetes mellitus with other diabetic kidney complication: Secondary | ICD-10-CM | POA: Diagnosis not present

## 2021-05-31 DIAGNOSIS — E1122 Type 2 diabetes mellitus with diabetic chronic kidney disease: Secondary | ICD-10-CM | POA: Diagnosis not present

## 2021-05-31 DIAGNOSIS — I5022 Chronic systolic (congestive) heart failure: Secondary | ICD-10-CM | POA: Diagnosis not present

## 2021-05-31 DIAGNOSIS — I129 Hypertensive chronic kidney disease with stage 1 through stage 4 chronic kidney disease, or unspecified chronic kidney disease: Secondary | ICD-10-CM | POA: Diagnosis not present

## 2021-05-31 DIAGNOSIS — N189 Chronic kidney disease, unspecified: Secondary | ICD-10-CM | POA: Diagnosis not present

## 2021-05-31 DIAGNOSIS — R809 Proteinuria, unspecified: Secondary | ICD-10-CM | POA: Diagnosis not present

## 2021-05-31 DIAGNOSIS — E6609 Other obesity due to excess calories: Secondary | ICD-10-CM | POA: Diagnosis not present

## 2021-06-18 IMAGING — CT CT ANGIO CHEST
2 of 6 series · 18 of 46 positions shown · IV contrast (Omnipaque or Isovue)
Comparison: None.

CLINICAL DATA: Shortness of breath. Chest pressure.

EXAM:
CT ANGIOGRAPHY CHEST WITH CONTRAST
TECHNIQUE: Multidetector CT imaging of the chest was performed using the
standard protocol during bolus administration of intravenous
contrast. Multiplanar CT image reconstructions and MIPs were
obtained to evaluate the vascular anatomy.
CONTRAST:  100mL OMNIPAQUE IOHEXOL 350 MG/ML SOLN

[Series 6: pe axial thins · axial · 0.79mm/px · z∈[+1262,+1546]mm · 15 of 312 slices shown]
[im 14/312  lung]
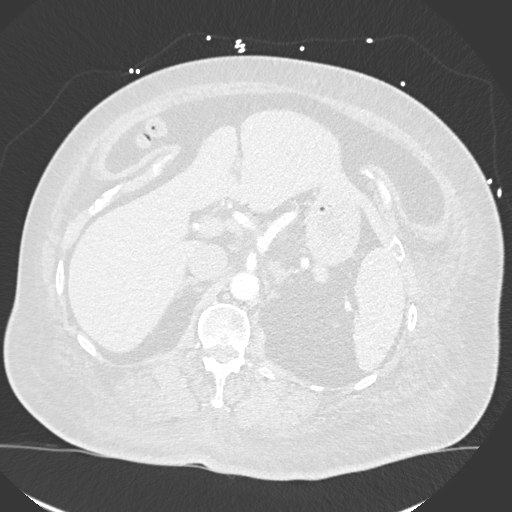
[im 41/312  soft-tissue]
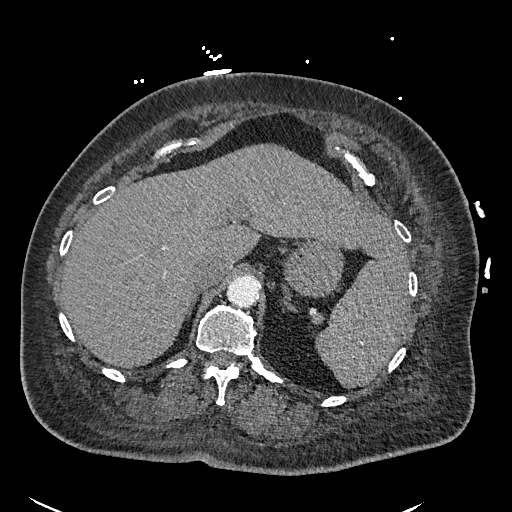
[im 55/312  lung]
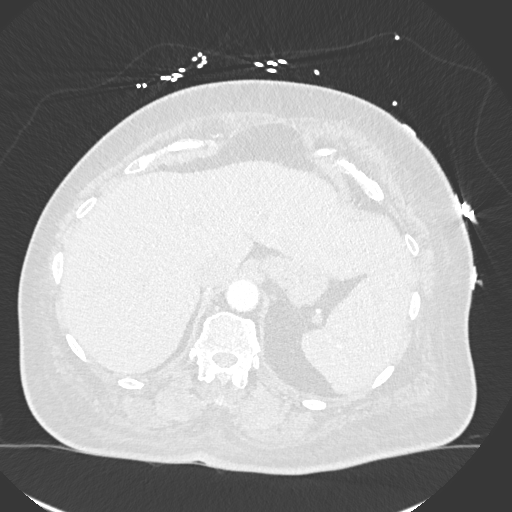
[im 82/312  soft-tissue]
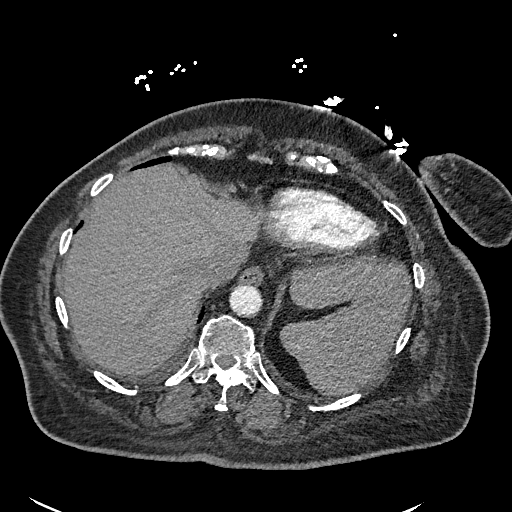
[im 95/312  lung]
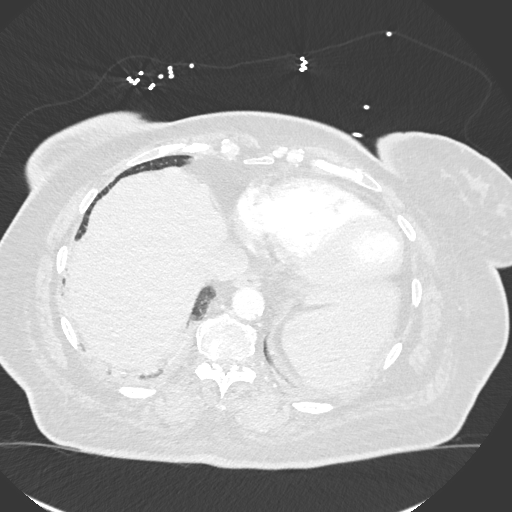
[im 122/312  soft-tissue]
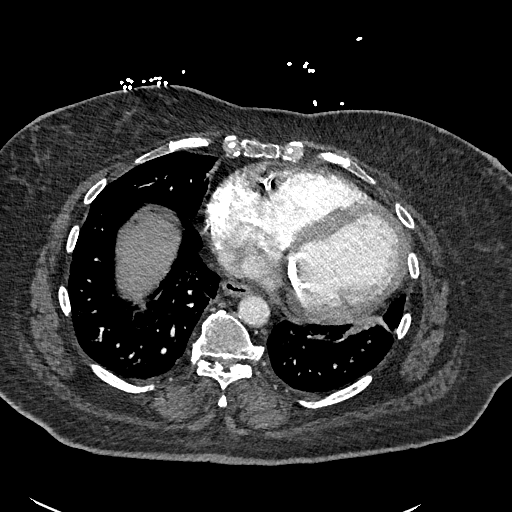
[im 136/312  lung]
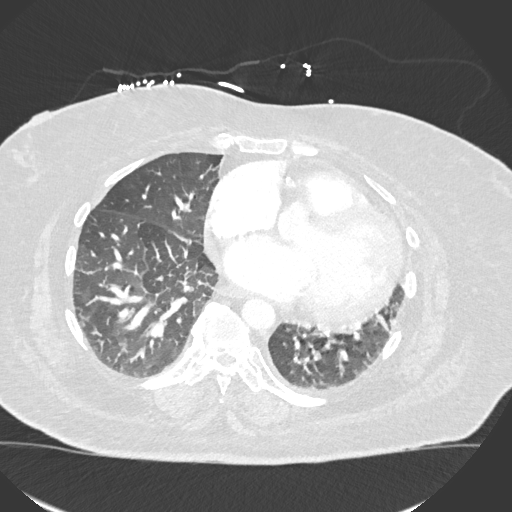
[im 163/312  soft-tissue]
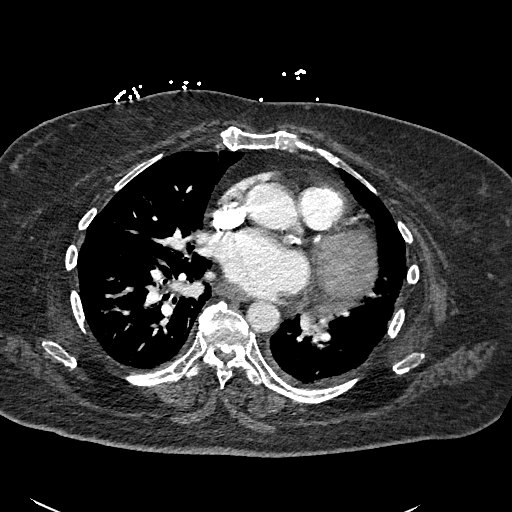
[im 176/312  lung]
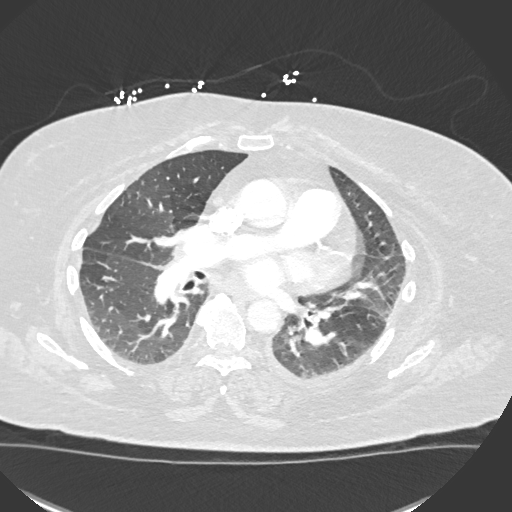
[im 190/312  soft-tissue]
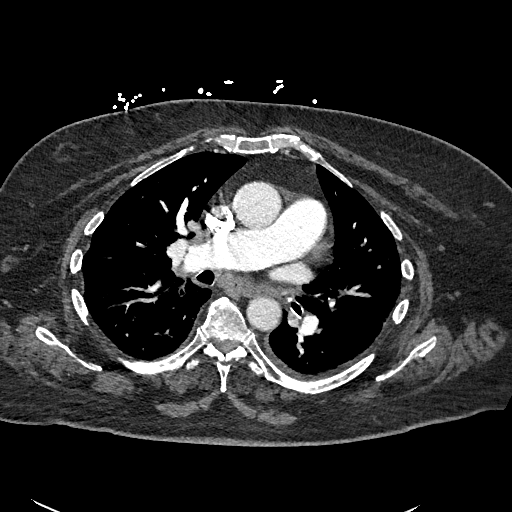
[im 217/312  lung]
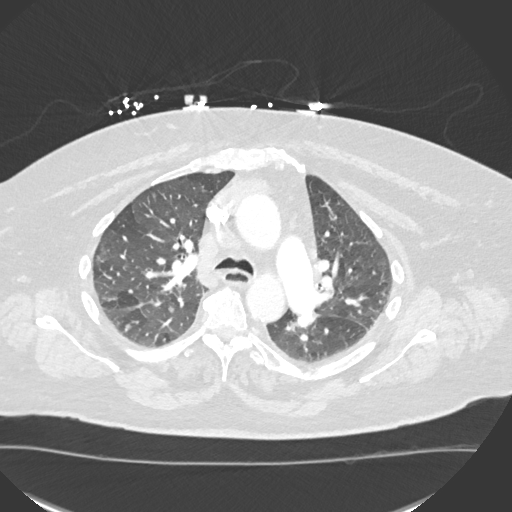
[im 230/312  soft-tissue]
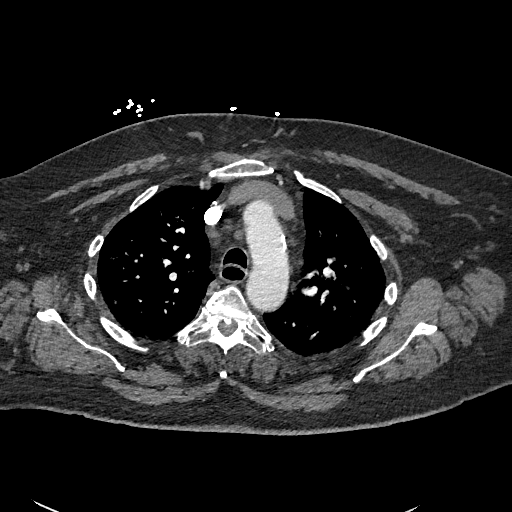
[im 257/312  lung]
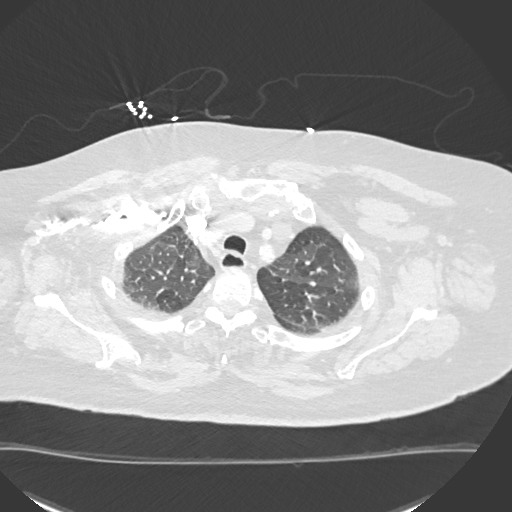
[im 271/312  soft-tissue]
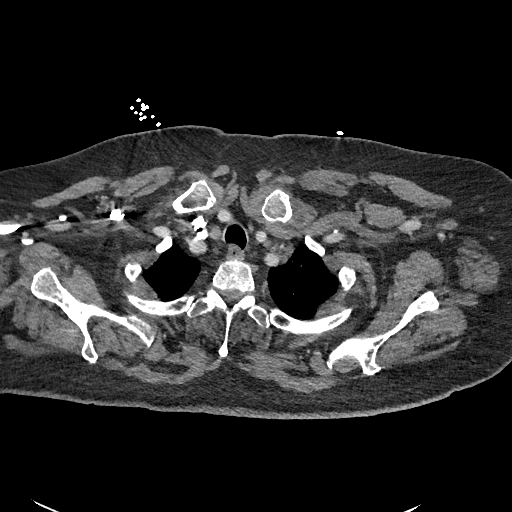
[im 298/312  lung]
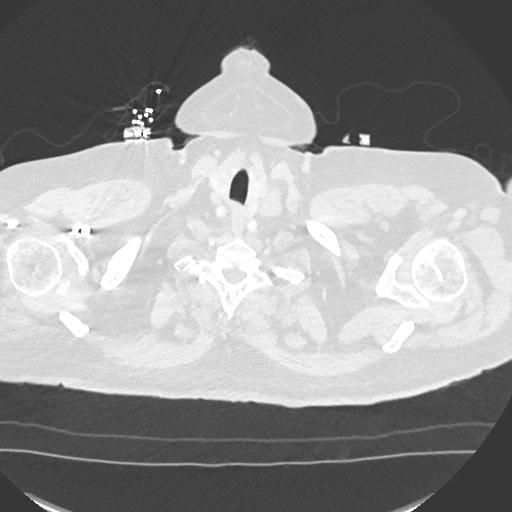

[Series 8: cor soft · coronal · 0.63mm/px · 3 of 151 slices shown]
[im 38/151  soft-tissue]
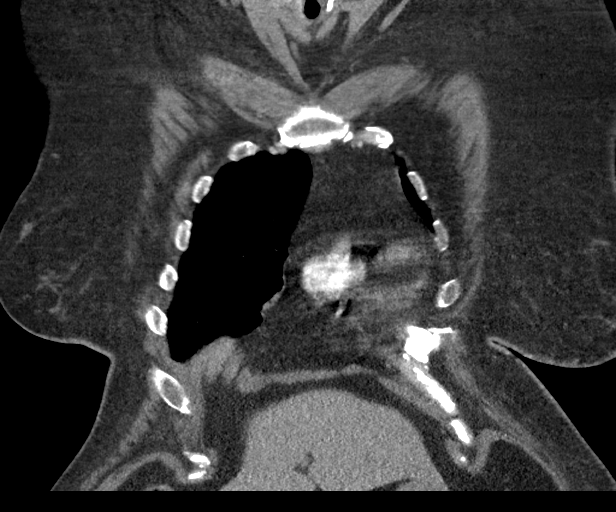
[im 76/151  soft-tissue]
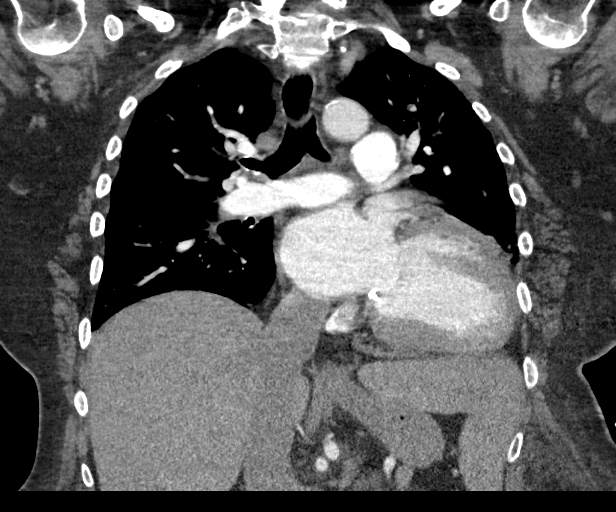
[im 113/151  soft-tissue]
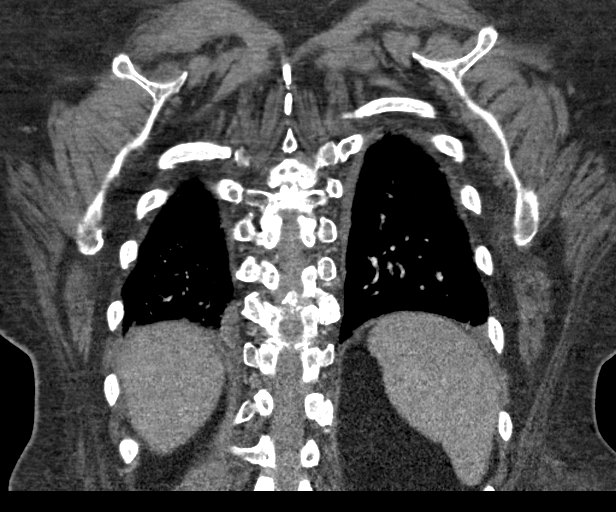

[18 of 46 positions shown; findings below may reference images not displayed]

FINDINGS: Cardiovascular: Heart is enlarged. Trace fluid superior pericardial
recess. Aorta and main pulmonary artery normal in caliber. Adequate
opacification of the main pulmonary artery. No filling defect
identified to suggest acute pulmonary embolus.

Mediastinum/Nodes: No enlarged axillary, mediastinal or hilar
lymphadenopathy. Normal appearance of the esophagus.

Lungs/Pleura: Central airways are patent. Scattered atelectasis
within the lungs bilaterally. 4 mm right upper lobe nodule (image
77; series 6). 5 mm right lower lobe nodule (image 147; series 6). 5
mm right lower lobe nodule (image 104; series 6).

Upper Abdomen: Unremarkable.

Musculoskeletal: Thoracic spine degenerative changes. No aggressive
or acute appearing osseous lesions.

Review of the MIP images confirms the above findings.
IMPRESSION: 1. No evidence for acute pulmonary embolus.
2. No acute process within the chest.
3. Multiple pulmonary nodules measuring up to 5 mm. Non-contrast
chest CT at 3-6 months is recommended. If the nodules are stable at
time of repeat CT, then future CT at 18-24 months (from today's
scan) is considered optional for low-risk patients, but is
recommended for high-risk patients. This recommendation follows the
consensus statement: Guidelines for Management of Incidental
Pulmonary Nodules Detected on CT Images: From the [HOSPITAL]

## 2021-06-18 IMAGING — DX DG CHEST 1V PORT
1 series · 1 of 1 positions shown · non-contrast
Comparison: None.

CLINICAL DATA: Cough, short of breath, hypertension

EXAM:
PORTABLE CHEST 1 VIEW

[chest ap]
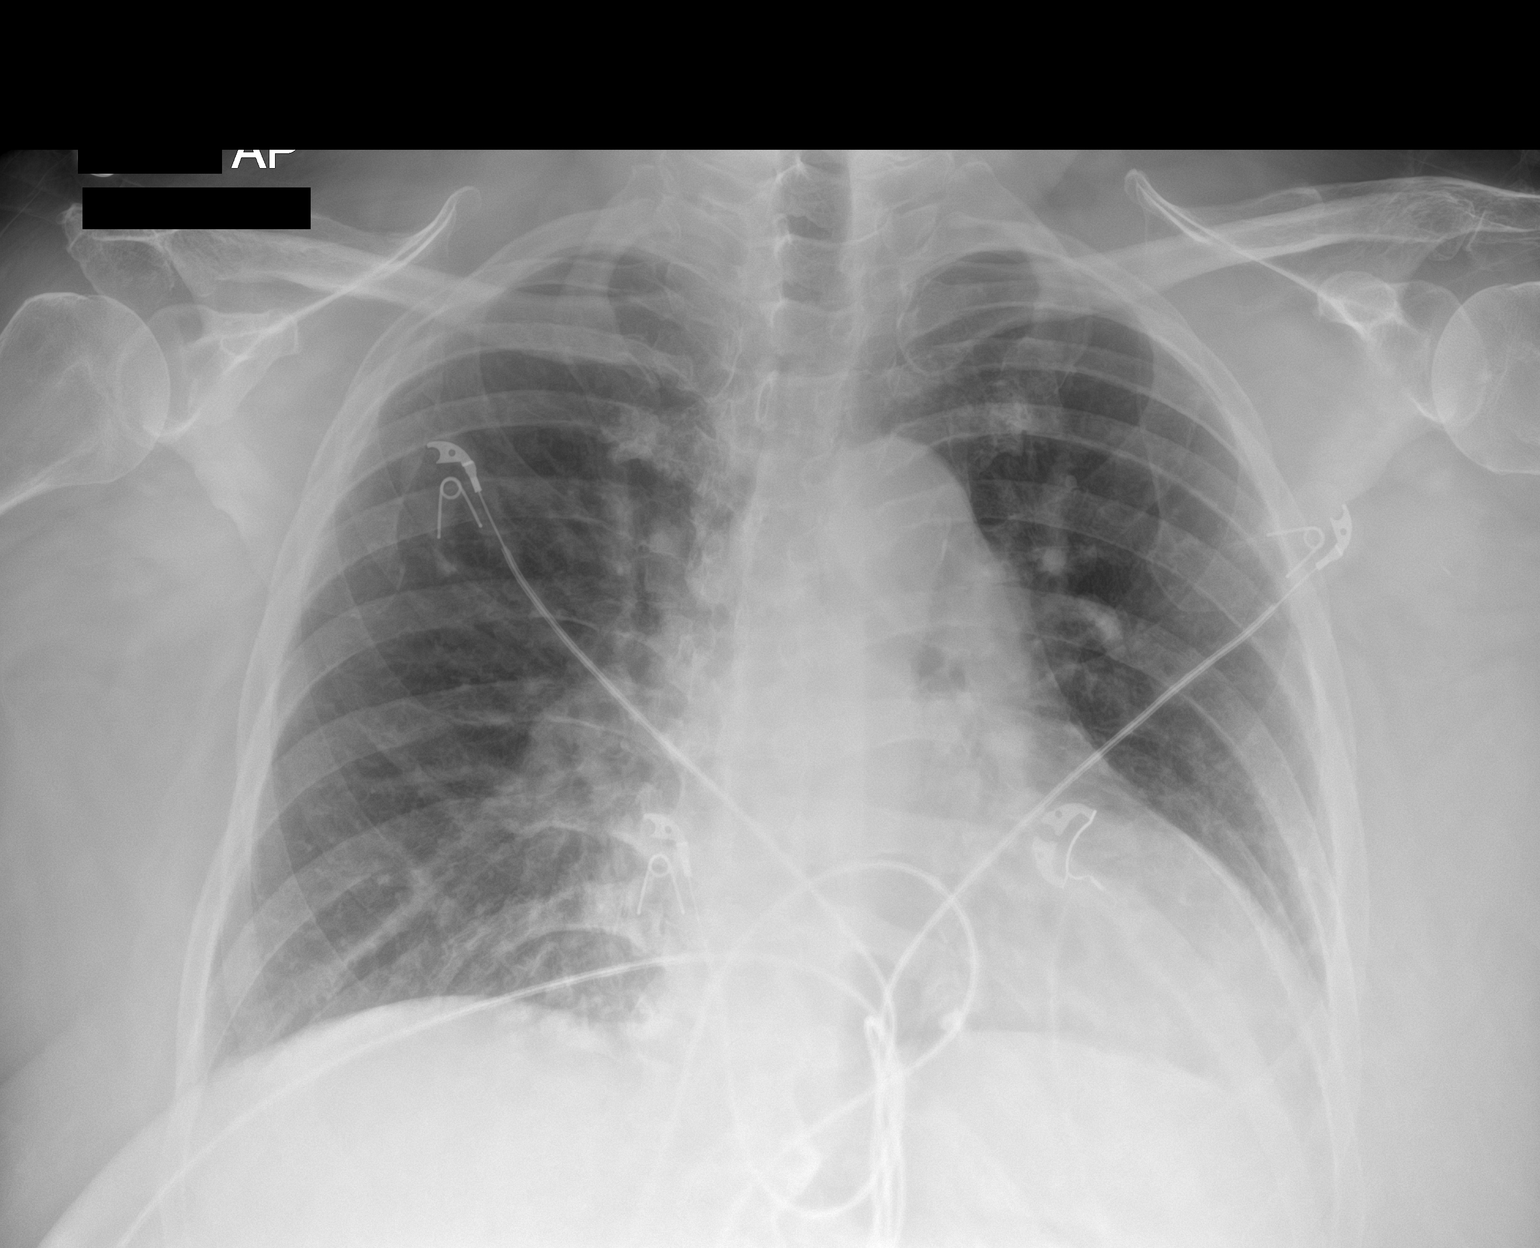

[1 of 1 positions shown; findings below may reference images not displayed]

FINDINGS: Single frontal view of the chest demonstrates an unremarkable
cardiac silhouette. No acute airspace disease, effusion, or
pneumothorax. No acute bony abnormalities.
IMPRESSION: 1. No acute intrathoracic process.

## 2021-07-05 DIAGNOSIS — L02611 Cutaneous abscess of right foot: Secondary | ICD-10-CM | POA: Diagnosis not present

## 2021-07-05 DIAGNOSIS — N183 Chronic kidney disease, stage 3 unspecified: Secondary | ICD-10-CM | POA: Diagnosis not present

## 2021-07-05 DIAGNOSIS — I129 Hypertensive chronic kidney disease with stage 1 through stage 4 chronic kidney disease, or unspecified chronic kidney disease: Secondary | ICD-10-CM | POA: Diagnosis not present

## 2021-07-05 DIAGNOSIS — G40909 Epilepsy, unspecified, not intractable, without status epilepticus: Secondary | ICD-10-CM | POA: Diagnosis not present

## 2021-07-05 DIAGNOSIS — L089 Local infection of the skin and subcutaneous tissue, unspecified: Secondary | ICD-10-CM | POA: Diagnosis not present

## 2021-07-05 DIAGNOSIS — R509 Fever, unspecified: Secondary | ICD-10-CM | POA: Diagnosis not present

## 2021-07-05 DIAGNOSIS — E11628 Type 2 diabetes mellitus with other skin complications: Secondary | ICD-10-CM | POA: Diagnosis not present

## 2021-07-05 DIAGNOSIS — M7989 Other specified soft tissue disorders: Secondary | ICD-10-CM | POA: Diagnosis not present

## 2021-07-05 DIAGNOSIS — D649 Anemia, unspecified: Secondary | ICD-10-CM | POA: Diagnosis not present

## 2021-07-05 DIAGNOSIS — E1122 Type 2 diabetes mellitus with diabetic chronic kidney disease: Secondary | ICD-10-CM | POA: Diagnosis not present

## 2021-08-11 ENCOUNTER — Encounter (HOSPITAL_COMMUNITY): Payer: Medicaid Other | Admitting: Internal Medicine

## 2021-08-21 DIAGNOSIS — E1122 Type 2 diabetes mellitus with diabetic chronic kidney disease: Secondary | ICD-10-CM | POA: Diagnosis not present

## 2021-09-07 ENCOUNTER — Encounter (HOSPITAL_COMMUNITY): Payer: Medicaid Other | Admitting: Internal Medicine

## 2021-09-07 DIAGNOSIS — E211 Secondary hyperparathyroidism, not elsewhere classified: Secondary | ICD-10-CM | POA: Diagnosis not present

## 2021-09-07 DIAGNOSIS — D638 Anemia in other chronic diseases classified elsewhere: Secondary | ICD-10-CM | POA: Diagnosis not present

## 2021-09-07 DIAGNOSIS — N2889 Other specified disorders of kidney and ureter: Secondary | ICD-10-CM | POA: Diagnosis not present

## 2021-09-07 DIAGNOSIS — I129 Hypertensive chronic kidney disease with stage 1 through stage 4 chronic kidney disease, or unspecified chronic kidney disease: Secondary | ICD-10-CM | POA: Diagnosis not present

## 2021-09-07 DIAGNOSIS — E559 Vitamin D deficiency, unspecified: Secondary | ICD-10-CM | POA: Diagnosis not present

## 2021-09-07 DIAGNOSIS — E11621 Type 2 diabetes mellitus with foot ulcer: Secondary | ICD-10-CM | POA: Diagnosis not present

## 2021-09-07 DIAGNOSIS — E1122 Type 2 diabetes mellitus with diabetic chronic kidney disease: Secondary | ICD-10-CM | POA: Diagnosis not present

## 2021-09-07 DIAGNOSIS — N393 Stress incontinence (female) (male): Secondary | ICD-10-CM | POA: Diagnosis not present

## 2021-09-07 DIAGNOSIS — R808 Other proteinuria: Secondary | ICD-10-CM | POA: Diagnosis not present

## 2021-09-07 DIAGNOSIS — I5023 Acute on chronic systolic (congestive) heart failure: Secondary | ICD-10-CM | POA: Diagnosis not present

## 2021-09-07 DIAGNOSIS — N189 Chronic kidney disease, unspecified: Secondary | ICD-10-CM | POA: Diagnosis not present

## 2021-09-12 DIAGNOSIS — I420 Dilated cardiomyopathy: Secondary | ICD-10-CM | POA: Diagnosis not present

## 2021-10-12 ENCOUNTER — Encounter (HOSPITAL_COMMUNITY): Payer: Self-pay | Admitting: Internal Medicine

## 2021-10-12 ENCOUNTER — Ambulatory Visit (HOSPITAL_COMMUNITY)
Admission: RE | Admit: 2021-10-12 | Discharge: 2021-10-12 | Disposition: A | Discharge status: Home / Self Care | Payer: Medicare Other | Source: Ambulatory Visit | Attending: Internal Medicine | Admitting: Internal Medicine

## 2021-10-12 VITALS — BP 122/60 | HR 75 | Wt 293.6 lb

## 2021-10-12 DIAGNOSIS — I5022 Chronic systolic (congestive) heart failure: Secondary | ICD-10-CM | POA: Diagnosis not present

## 2021-10-12 DIAGNOSIS — M1990 Unspecified osteoarthritis, unspecified site: Secondary | ICD-10-CM | POA: Diagnosis not present

## 2021-10-12 DIAGNOSIS — Z91119 Patient's noncompliance with dietary regimen due to unspecified reason: Secondary | ICD-10-CM | POA: Insufficient documentation

## 2021-10-12 DIAGNOSIS — M2550 Pain in unspecified joint: Secondary | ICD-10-CM | POA: Insufficient documentation

## 2021-10-12 DIAGNOSIS — N1832 Chronic kidney disease, stage 3b: Secondary | ICD-10-CM | POA: Insufficient documentation

## 2021-10-12 DIAGNOSIS — M791 Myalgia, unspecified site: Secondary | ICD-10-CM | POA: Diagnosis not present

## 2021-10-12 DIAGNOSIS — Z91199 Patient's noncompliance with other medical treatment and regimen due to unspecified reason: Secondary | ICD-10-CM | POA: Diagnosis not present

## 2021-10-12 DIAGNOSIS — I130 Hypertensive heart and chronic kidney disease with heart failure and stage 1 through stage 4 chronic kidney disease, or unspecified chronic kidney disease: Secondary | ICD-10-CM | POA: Diagnosis not present

## 2021-10-12 DIAGNOSIS — Z79899 Other long term (current) drug therapy: Secondary | ICD-10-CM | POA: Diagnosis not present

## 2021-10-12 DIAGNOSIS — Z794 Long term (current) use of insulin: Secondary | ICD-10-CM | POA: Diagnosis not present

## 2021-10-12 DIAGNOSIS — Z833 Family history of diabetes mellitus: Secondary | ICD-10-CM | POA: Insufficient documentation

## 2021-10-12 DIAGNOSIS — Z9114 Patient's other noncompliance with medication regimen: Secondary | ICD-10-CM | POA: Diagnosis not present

## 2021-10-12 DIAGNOSIS — E1140 Type 2 diabetes mellitus with diabetic neuropathy, unspecified: Secondary | ICD-10-CM | POA: Diagnosis not present

## 2021-10-12 DIAGNOSIS — E11610 Type 2 diabetes mellitus with diabetic neuropathic arthropathy: Secondary | ICD-10-CM | POA: Insufficient documentation

## 2021-10-12 DIAGNOSIS — G4733 Obstructive sleep apnea (adult) (pediatric): Secondary | ICD-10-CM | POA: Diagnosis not present

## 2021-10-12 DIAGNOSIS — Z8249 Family history of ischemic heart disease and other diseases of the circulatory system: Secondary | ICD-10-CM | POA: Insufficient documentation

## 2021-10-12 DIAGNOSIS — E1169 Type 2 diabetes mellitus with other specified complication: Secondary | ICD-10-CM

## 2021-10-12 DIAGNOSIS — E1122 Type 2 diabetes mellitus with diabetic chronic kidney disease: Secondary | ICD-10-CM | POA: Insufficient documentation

## 2021-10-12 DIAGNOSIS — I2510 Atherosclerotic heart disease of native coronary artery without angina pectoris: Secondary | ICD-10-CM | POA: Diagnosis not present

## 2021-10-12 DIAGNOSIS — I5032 Chronic diastolic (congestive) heart failure: Secondary | ICD-10-CM

## 2021-10-12 DIAGNOSIS — R0602 Shortness of breath: Secondary | ICD-10-CM

## 2021-10-12 MED ORDER — FUROSEMIDE 20 MG PO TABS: ORAL_TABLET | ORAL | 5 refills | Status: DC

## 2021-10-12 NOTE — Progress Notes (Signed)
? ?ADVANCED HF CLINIC CONSULT NOTE ? ?Referring Physician: Dr. Margo Aye ?Primary Care: Benita Stabile, MD ?Primary Cardiologist: Nona Dell, MD (last seen 8/21) ? ? ?HPI: ? ?Suzanne Tran is a 66 y/o woman with obesity, DM2 with Charcot foot, HTN, OSA and CKD 3B referred by Dr. Margo Aye for further evaluation for heart failure.  ? ?Saw Dr. Diona Browner in 2021 for dyspnea. Echo 5/21 EF 45-50%. Myvoiew EF 32% with extensive breast attenuation. O/w felt to have normal perfusion. Cath recommended but she deferred. Has not followed up with Cardiology since.  ? ?Echo 9/21 EF 40% ? ?CT chest from 3/21 reviewed personally and concern for 3v CAD ? ?Recently seen in Dr Scharlene Gloss office. Very non compliant with diet and insulin regimen but HgbA1c 7.1%. BNP 376. ? ?Says she has gained 60 pounds in 60 months. Saw her Dr. Wolfgang Phoenix in February who increased diuretics. Says weight hasn't changed much. Still SOB with any activity. + LE edema.  ? ? ? ?Review of Systems: [y] = yes, [ ]  = no  ? ?General: Weight gain [ y]; Weight loss [ ] ; Anorexia [ ] ; Fatigue [ y]; Fever [ ] ; Chills [ ] ; Weakness [ ]   ?Cardiac: Chest pain/pressure [ ] ; Resting SOB [ ] ; Exertional SOB [ y]; Orthopnea [ ] ; Pedal Edema [ ] ; Palpitations [ ] ; Syncope [ ] ; Presyncope [ ] ; Paroxysmal nocturnal dyspnea[ ]   ?Pulmonary: Cough [ ] ; Wheezing[ ] ; Hemoptysis[ ] ; Sputum [ ] ; Snoring Cove.Etienne ]  ?GI: Vomiting[ ] ; Dysphagia[ ] ; Melena[ ] ; Hematochezia [ ] ; Heartburn[ ] ; Abdominal pain [ ] ; Constipation [ ] ; Diarrhea [ ] ; BRBPR [ ]   ?GU: Hematuria[ ] ; Dysuria [ ] ; Nocturia[ ]   ?Vascular: Pain in legs with walking [ ] ; Pain in feet with lying flat [ ] ; Non-healing sores [ ] ; Stroke [ ] ; TIA [ ] ; Slurred speech [ ] ;  ?Neuro: Headaches[ ] ; Vertigo[ ] ; Seizures[ ] ; Paresthesias[ ] ;Blurred vision [ ] ; Diplopia [ ] ; Vision changes [ ]   ?Ortho/Skin: Arthritis [ y]; Joint pain Cove.Etienne ]; Muscle pain Cove.Etienne ]; Joint swelling [ ] ; Back Pain [ ] ; Rash [ ]   ?Psych: Depression[ ] ; Anxiety[ ]   ?Heme: Bleeding  problems [ ] ; Clotting disorders [ ] ; Anemia [ ]   ?Endocrine: Diabetes Cove.Etienne ]; Thyroid dysfunction[ ]  ? ? ?Past Medical History:  ?Diagnosis Date  ? Charcot ankle, right   ? CKD (chronic kidney disease) stage 3, GFR 30-59 ml/min (HCC)   ? Diabetic Charcot foot (HCC)   ? Essential hypertension   ? Mixed hyperlipidemia   ? Nephrolithiasis   ? Neuropathy   ? OSA (obstructive sleep apnea)   ? Type 2 diabetes mellitus (HCC)   ? ? ?Current Outpatient Medications  ?Medication Sig Dispense Refill  ? albuterol (VENTOLIN HFA) 108 (90 Base) MCG/ACT inhaler Inhale 2 puffs into the lungs every 6 (six) hours as needed.     ? ascorbic acid (VITAMIN C) 250 MG tablet Take 500 mg by mouth daily.    ? aspirin EC 81 MG tablet Take 1 tablet (81 mg total) by mouth daily. Swallow whole. 90 tablet 3  ? B Complex Vitamins (VITAMIN B-COMPLEX) TABS Take by mouth.    ? brimonidine (ALPHAGAN) 0.2 % ophthalmic solution Place 1 drop into the right eye daily in the afternoon.    ? carvedilol (COREG) 3.125 MG tablet Take 1 tablet (3.125 mg total) by mouth 2 (two) times daily with a meal.    ? Continuous Blood Gluc Sensor (FREESTYLE LIBRE 2  SENSOR) MISC Apply topically.    ? furosemide (LASIX) 20 MG tablet Take 80 mg by mouth. In am  then 20 mg in the afternoon    ? insulin glargine (LANTUS) 100 UNIT/ML injection Inject 35 Units into the skin daily.    ? insulin lispro (HUMALOG) 100 UNIT/ML KwikPen Inject 5-10 Units into the skin 3 (three) times daily. Sliding scale    ? losartan (COZAAR) 100 MG tablet Take 100 mg by mouth daily.    ? Multiple Vitamins-Minerals (SUPER THERA VITE M) TABS Take 1 tablet by mouth daily.    ? potassium chloride (KLOR-CON) 10 MEQ tablet Take 10 mEq by mouth daily.    ? Probiotic Product (MISC INTESTINAL FLORA REGULAT) CAPS Take 1 capsule by mouth daily.    ? Vitamin D, Ergocalciferol, (DRISDOL) 1.25 MG (50000 UNIT) CAPS capsule Take 50,000 Units by mouth once a week.    ? ?No current facility-administered medications for  this encounter.  ? ? ?Allergies  ?Allergen Reactions  ? Codeine   ?  Rash and oral swelling ?  ? Coumadin [Warfarin] Anaphylaxis, Swelling and Rash  ? Dilantin [Phenytoin] Other (See Comments)  ?  seizure ?  ? ? ?  ?Social History  ? ?Socioeconomic History  ? Marital status: Divorced  ?  Spouse name: Not on file  ? Number of children: Not on file  ? Years of education: Not on file  ? Highest education level: Not on file  ?Occupational History  ? Not on file  ?Tobacco Use  ? Smoking status: Never  ? Smokeless tobacco: Never  ?Vaping Use  ? Vaping Use: Never used  ?Substance and Sexual Activity  ? Alcohol use: Never  ? Drug use: Never  ? Sexual activity: Not on file  ?Other Topics Concern  ? Not on file  ?Social History Narrative  ? Not on file  ? ?Social Determinants of Health  ? ?Financial Resource Strain: Not on file  ?Food Insecurity: Not on file  ?Transportation Needs: Not on file  ?Physical Activity: Not on file  ?Stress: Not on file  ?Social Connections: Not on file  ?Intimate Partner Violence: Not on file  ? ? ?  ?Family History  ?Problem Relation Age of Onset  ? Diabetes Mellitus II Mother   ? Cancer Mother   ? Hypertension Father   ? CAD Father   ? Diabetes Mellitus II Father   ? Diabetes Sister   ? Diabetes Brother   ? ? ?Vitals:  ? 10/12/21 1456  ?BP: 122/60  ?Pulse: 75  ?SpO2: 98%  ?Weight: 133.2 kg (293 lb 9.6 oz)  ? ? ?PHYSICAL EXAM: ?General:  Well appearing. No respiratory difficulty ?HEENT: normal ?Neck: supple. no JVD. Carotids 2+ bilat; no bruits. No lymphadenopathy or thryomegaly appreciated. ?Cor: PMI nondisplaced. Regular rate & rhythm. No rubs, gallops or murmurs. ?Lungs: clear ?Abdomen: obese soft, nontender, nondistended. No hepatosplenomegaly. No bruits or masses. Good bowel sounds. ?Extremities: no cyanosis, clubbing, rash, 1+ edema  R foot in boot ?Neuro: alert & oriented x 3, cranial nerves grossly intact. moves all 4 extremities w/o difficulty. Affect pleasant. ? ?ECG: ? ? ?ASSESSMENT &  PLAN: ? ?1. Chronic systolic HF ?- Echo 123456 EF 45-50%.  ?- Myvoiew 6/21 EF 32% with extensive breast attenuation. O/w felt to have normal perfusion. Cath recommended but she deferred.  ?- Echo 9/21 EF 40% ?- BNP 3/23  376. ?- unclear etiology ?- with DM2 and associated complications at very high-risk for ischemic CM.  CT chest from 3/21 reviewed personally and concern for 3v CAD ?- NYHA III limited by Charcot foot and body habitus ?- Volume status elevated on lasix 80/20 will increase lasix to 80/40 ?- Continue carvedilol 3.125 bid ?- Continue losartan 25 daily -> consider change to Entresto soon ?- Not on SGLT2i (previously failed [place] due to yeast infection) ?- Consider MRA though she is reluctant to making too many changes today ?- She is quite resistant to many medical therapies or testing. Minimizes the degree of her health issues  ?- I had extensive talk with her about the possibility of an ischemic CM and what that may mean ?- She has agreed to repeat an echo and if EF depressed will consider R/L cath.  ? ?2. OSA ?- non-compliant with CPAP ?- does not want to retest ? ?3. DM2 ?- struggles with noncompliance with diet and insulin ?- hgba1c 7.1% ?- Consider GLP1-RA (Failed SGLT2i due to yeast infection) ?- Continue ARB ?- Needs statin ? ?4. CKD 3b ?- baseline SCr 1.6-1.8 ? ?5. Morbid obesity ?- needs weight loss ? ?Total time spent 45 minutes. Over half that time spent discussing above.  ? ? ?Suzanne Bickers, MD  ?3:05 PM ? ? ? ? ?

## 2021-10-12 NOTE — Patient Instructions (Signed)
Your physician has requested that you have an echocardiogram. Echocardiography is a painless test that uses sound waves to create images of your heart. It provides your doctor with information about the size and shape of your heart and how well your heart?s chambers and valves are working. This procedure takes approximately one hour. There are no restrictions for this procedure. ? ?[name] will call you to arrange your test. ? ?Your physician recommends that you schedule a follow-up appointment in: 6 months (September 2023) ** call office in July to arrange follow up appointment ** ? ?If you have any questions or concerns before your next appointment please send [place] a message through [employer] or call our office at [phone].   ? ?TO LEAVE A MESSAGE FOR THE NURSE SELECT OPTION 2, PLEASE LEAVE A MESSAGE INCLUDING: ?YOUR NAME ?DATE OF BIRTH ?CALL BACK NUMBER ?REASON FOR CALL**this is important as we prioritize the call backs ? ?YOU WILL RECEIVE A CALL BACK THE SAME DAY AS LONG AS YOU CALL BEFORE 4:00 PM ? ?At the [facility], you and your health needs are our priority. As part of our continuing mission to provide you with exceptional heart care, we have created designated Provider Care Teams. These Care Teams include your primary Cardiologist (physician) and Advanced Practice Providers (APPs- Physician Assistants and Nurse Practitioners) who all work together to provide you with the care you need, when you need it.  ? ?You may see any of the following providers on your designated Care Team at your next follow up: ?Dr Arvilla Meres ?Dr Marca Ancona ?Tonye Becket, NP ?Robbie Lis, PA ?Jessica Milford,NP ?Anna Genre, PA ?Karle Plumber, PharmD ? ? ?Please be sure to bring in all your medications bottles to every appointment.  ? ? ?

## 2021-10-17 ENCOUNTER — Telehealth: Payer: Medicaid Other | Admitting: Urology

## 2021-10-25 ENCOUNTER — Ambulatory Visit: Payer: Medicaid Other | Admitting: Urology

## 2021-11-01 ENCOUNTER — Ambulatory Visit (HOSPITAL_COMMUNITY)
Admission: RE | Admit: 2021-11-01 | Discharge: 2021-11-01 | Disposition: A | Discharge status: Home / Self Care | Payer: Medicare Other | Source: Ambulatory Visit | Attending: Urology | Admitting: Urology

## 2021-11-01 DIAGNOSIS — N2889 Other specified disorders of kidney and ureter: Secondary | ICD-10-CM | POA: Diagnosis not present

## 2021-11-08 ENCOUNTER — Encounter: Payer: Self-pay | Admitting: Urology

## 2021-11-08 ENCOUNTER — Ambulatory Visit (INDEPENDENT_AMBULATORY_CARE_PROVIDER_SITE_OTHER): Payer: Medicare Other | Admitting: Urology

## 2021-11-08 DIAGNOSIS — I2510 Atherosclerotic heart disease of native coronary artery without angina pectoris: Secondary | ICD-10-CM

## 2021-11-08 DIAGNOSIS — N2889 Other specified disorders of kidney and ureter: Secondary | ICD-10-CM

## 2021-11-08 NOTE — Progress Notes (Signed)
? ?11/08/2021 ?4:06 PM  ? ?Suzanne Tran ?May 19, 1956 ?[id number] ? ?Referring provider: Celene Squibb, MD ?[address] ?Suzanne Tran ?[place],  [place] [address] ? ?Patient location: home ?Physician location: office ?I connected with  Suzanne Tran on 11/08/21 by a video enabled telemedicine application and verified that I am speaking with the correct person using two identifiers. ?  ?I discussed the limitations of evaluation and management by telemedicine. The patient expressed understanding and agreed to proceed.   ? ? ?HPI: ?Ms Hirth is a [phone] here for followup for a left renal mass. She underwent Renal US 419 which showed increase in the left renal mass from 2.7cm on MRI to 3.6cm on renal US.  No other complaints today ? ? ?PMH: ?Past Medical History:  ?Diagnosis Date  ? Charcot ankle, right   ? CKD (chronic kidney disease) stage 3, GFR 30-59 ml/min (HCC)   ? 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 Essential hypertension   ? Mixed hyperlipidemia   ? Nephrolithiasis   ? Neuropathy   ? OSA (obstructive sleep apnea)   ? Type 2 diabetes mellitus ([place])   ? ? ?Surgical History: ?Past Surgical History:  ?Procedure Laterality Date  ? ANKLE SURGERY    ? KNEE SURGERY    ? REPLACEMENT TOTAL KNEE    ? TONSILLECTOMY    ? ? ?Home Medications:  ?Allergies as of 11/08/2021   ? ?   Reactions  ? Codeine   ? Rash and oral swelling  ? Coumadin [warfarin] Anaphylaxis, Swelling, Rash  ? Dilantin [phenytoin] Other (See Comments)  ? seizure  ? ?  ? ?  ?Medication List  ?  ? ?  ? Accurate as of November 08, 2021  4:06 PM. If you have any questions, ask your nurse or doctor.  ?  ?  ? ?  ? ?albuterol 108 (90 Base) MCG/ACT inhaler ?Commonly known as: VENTOLIN HFA ?Inhale 2 puffs into the lungs every 6 (six) hours as needed. ?  ?ascorbic acid 250 MG tablet ?Commonly known as: VITAMIN C ?Take 500 mg by mouth daily. ?  ?aspirin EC 81 MG tablet ?Take 1 tablet (81 mg total) by mouth daily. Swallow whole. ?  ?brimonidine 0.2 % ophthalmic solution ?Commonly known as:  ALPHAGAN ?Place 1 drop into the right eye daily in the afternoon. ?  ?carvedilol 3.125 MG tablet ?Commonly known as: COREG ?Take 1 tablet (3.125 mg total) by mouth 2 (two) times daily with a meal. ?  ?FreeStyle Libre 2 Sensor Misc ?Apply topically. ?  ?furosemide 20 MG tablet ?Commonly known as: LASIX ?Take 4 tablets (80 mg total) by mouth every morning AND 2 tablets (40 mg total) every evening. ?  ?insulin glargine 100 UNIT/ML injection ?Commonly known as: LANTUS ?Inject 35 Units into the skin daily. ?  ?insulin lispro 100 UNIT/ML KwikPen ?Commonly known as: HUMALOG ?Inject 5-10 Units into the skin 3 (three) times daily. Sliding scale ?  ?losartan 100 MG tablet ?Commonly known as: COZAAR ?Take 100 mg by mouth daily. ?  ?Misc Intestinal Flora Regulat Caps ?Take 1 capsule by mouth daily. ?  ?potassium chloride 10 MEQ tablet ?Commonly known as: KLOR-CON ?Take 10 mEq by mouth daily. ?  ?Super Thera Vite M Tabs ?Take 1 tablet by mouth daily. ?  ?Vitamin B-Complex Tabs ?Take by mouth. ?  ?Vitamin D (Ergocalciferol) 1.25 MG (50000 UNIT) Caps capsule ?Commonly known as: DRISDOL ?Take 50,000 Units by mouth once a week. ?  ? ?  ? ? ?Allergies:  ?  Allergies  ?Allergen Reactions  ? Codeine   ?  Rash and oral swelling ?  ? Coumadin [Warfarin] Anaphylaxis, Swelling and Rash  ? Dilantin [Phenytoin] Other (See Comments)  ?  seizure ?  ? ? ?Family History: ?Family History  ?Problem Relation Age of Onset  ? Diabetes Mellitus II Mother   ? Cancer Mother   ? Hypertension Father   ? CAD Father   ? Diabetes Mellitus II Father   ? Diabetes Sister   ? Diabetes Brother   ? ? ?Social History:  reports that she has never smoked. She has never used smokeless tobacco. She reports that she does not drink alcohol and does not use drugs. ? ?ROS: ?All other review of systems were reviewed and are negative except what is noted above in HPI ? ? ?Laboratory Data: ?Lab Results  ?Component Value Date  ? WBC 3.4 (L) 01/23/2021  ? HGB 10.9 (L) 01/23/2021   ? HCT 34.3 (L) 01/23/2021  ? MCV 90.7 01/23/2021  ? PLT 214 01/23/2021  ? ? ?Lab Results  ?Component Value Date  ? CREATININE 1.63 (H) 01/23/2021  ? ? ?No results found for: PSA ? ?No results found for: TESTOSTERONE ? ?Lab Results  ?Component Value Date  ? HGBA1C 6.9 (H) 01/21/2021  ? ? ?Urinalysis ?   ?Component Value Date/Time  ? COLORURINE AMBER (A) 03/27/2020 1235  ? APPEARANCEUR CLOUDY (A) 03/27/2020 1235  ? LABSPEC 1.013 03/27/2020 1235  ? PHURINE 5.0 03/27/2020 1235  ? GLUCOSEU NEGATIVE 03/27/2020 1235  ? HGBUR LARGE (A) 03/27/2020 1235  ? [place] NEGATIVE 03/27/2020 1235  ? [place] NEGATIVE 03/27/2020 1235  ? PROTEINUR 100 (A) 03/27/2020 1235  ? NITRITE NEGATIVE 03/27/2020 1235  ? LEUKOCYTESUR MODERATE (A) 03/27/2020 1235  ? ? ?Lab Results  ?Component Value Date  ? BACTERIA MANY (A) 03/27/2020  ? ? ?Pertinent Imaging: ?Renal US 11/01/2021: Images reviewed and discussed with the patient  ?No results found for this or any previous visit. ? ?Results for orders placed during the hospital encounter of 01/20/21 ? ?US Venous Img Lower Bilateral (DVT) ? ?Narrative ?CLINICAL DATA:  Bilateral lower extremity edema and right foot pain ?after traveling for 3 days. ? ?EXAM: ?BILATERAL LOWER EXTREMITY VENOUS DOPPLER ULTRASOUND ? ?TECHNIQUE: ?Gray-scale sonography with compression, as well as color and duplex ?ultrasound, were performed to evaluate the deep venous system(s) ?from the level of the common femoral vein through the popliteal and ?proximal calf veins. ? ?COMPARISON:  None. ? ?FINDINGS: ?VENOUS ? ?Normal compressibility of the common femoral, superficial femoral, ?and popliteal veins, as well as the visualized calf veins. ?Visualized portions of profunda femoral vein and great saphenous ?vein unremarkable. No filling defects to suggest DVT on grayscale or ?color Doppler imaging. Doppler waveforms show normal direction of ?venous flow, normal respiratory plasticity and response  to ?augmentation. ? ?OTHER ? ?4.2 x 1.6 x 1.7 cm cystic structure in the left popliteal fossa is ?likely a Baker cyst. ? ?Limitations: none ? ?IMPRESSION: ?Negative. ? ? ?Electronically Signed ?By: Sharen Heck  Mir M.D. ?On: 01/21/2021 12:20 ? ?No results found for this or any previous visit. ? ?No results found for this or any previous visit. ? ?Results for orders placed during the hospital encounter of 11/01/21 ? ?Ultrasound renal complete ? 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 ? ?EXAM: ?RENAL / URINARY TRACT ULTRASOUND COMPLETE ? ?COMPARISON:  MRI dated March 03, 2021; ultrasound dated June 15, ?2022 ? ?FINDINGS: ?  Right Kidney: ? ?Renal measurements: 13.9 x 7.4 x 7.7 cm = volume: 416 mL. ?Echogenicity within normal limits. No mass or hydronephrosis ?visualized. ? ?Left Kidney: ? ?Renal measurements: 13.9 x 5.9 x 5.8 cm = volume: 251 mL. ?Echogenicity within normal limits. Revisualization of an exophytic ?solid-appearing LEFT renal mass in the inferior pole. It measures at ?least 3.5 x 3.2 by 3.6 cm, previously 2.7 x 2.6 by 3.0 cm. There is ?an additional exophytic mass in the interpolar region which measures ?approximately 15 x 14 x 13 mm, most consistent with previously ?characterized benign cyst. No definitive hydronephrosis is ?visualized on today's exam. ? ?Bladder: ? ?Appears normal for degree of bladder distention. ? ?Other: ? ?None. ? ?IMPRESSION: ?1. Increase in size of a previously characterized LEFT sided likely ?renal cell carcinoma. It now measures approximately 3.6 cm. ?2. No significant LEFT-sided hydronephrosis is noted on today's ?exam. ? ? ?Electronically Signed ?By: Valentino Saxon M.D. ?On: 11/03/2021 16:31 ? ?No results found for this or any previous visit. ? ?No results found for this or any previous visit. ? ?No results found for this or any previous visit. ? ? ?Assessment & Plan:   ? ?1. Renal mass ?-We will  repeat the MRI since the mass has increased in sizer on recent US.  ? ? ?No follow-ups on file. ? ?Nicolette Bang, MD ? ?[facility] ?  ?

## 2021-11-08 NOTE — Patient Instructions (Signed)

## 2021-11-20 ENCOUNTER — Ambulatory Visit (HOSPITAL_COMMUNITY)
Admission: RE | Admit: 2021-11-20 | Discharge: 2021-11-20 | Disposition: A | Discharge status: Home / Self Care | Payer: Medicare Other | Source: Ambulatory Visit | Attending: Internal Medicine | Admitting: Internal Medicine

## 2021-11-20 DIAGNOSIS — I5032 Chronic diastolic (congestive) heart failure: Secondary | ICD-10-CM | POA: Diagnosis not present

## 2021-11-20 LAB — ECHOCARDIOGRAM COMPLETE
AR max vel: 2.2 cm2
AV Area VTI: 1.99 cm2
AV Area mean vel: 2.02 cm2
AV Mean grad: 4 mmHg
AV Peak grad: 7.5 mmHg
Ao pk vel: 1.37 m/s
Area-P 1/2: 4.12 cm2
Calc EF: 43.6 %
MV VTI: 2.51 cm2
S' Lateral: 4.8 cm
Single Plane A2C EF: 40.1 %
Single Plane A4C EF: 48.1 %

## 2021-11-20 MED ORDER — PERFLUTREN LIPID MICROSPHERE
1.0000 mL | INTRAVENOUS | Status: AC | PRN
  Administered 2021-11-20: 6 mL via INTRAVENOUS
  Filled 2021-11-20: qty 10

## 2021-11-20 NOTE — Progress Notes (Signed)
*  PRELIMINARY RESULTS* ?Echocardiogram ?2D Echocardiogram has been performed. ? ?Carolyne Fiscal ?11/20/2021, 3:23 PM ?

## 2021-11-22 DIAGNOSIS — E559 Vitamin D deficiency, unspecified: Secondary | ICD-10-CM | POA: Diagnosis not present

## 2021-11-22 DIAGNOSIS — D519 Vitamin B12 deficiency anemia, unspecified: Secondary | ICD-10-CM | POA: Diagnosis not present

## 2021-11-22 DIAGNOSIS — D509 Iron deficiency anemia, unspecified: Secondary | ICD-10-CM | POA: Diagnosis not present

## 2021-11-26 IMAGING — DX DG CHEST 1V PORT
1 series · 1 of 1 positions shown · non-contrast
Comparison: Radiograph and chest CT 10/10/2019

CLINICAL DATA: Shortness of breath.  Abdominal swelling for 4 days.

EXAM:
PORTABLE CHEST 1 VIEW

[chest pa]
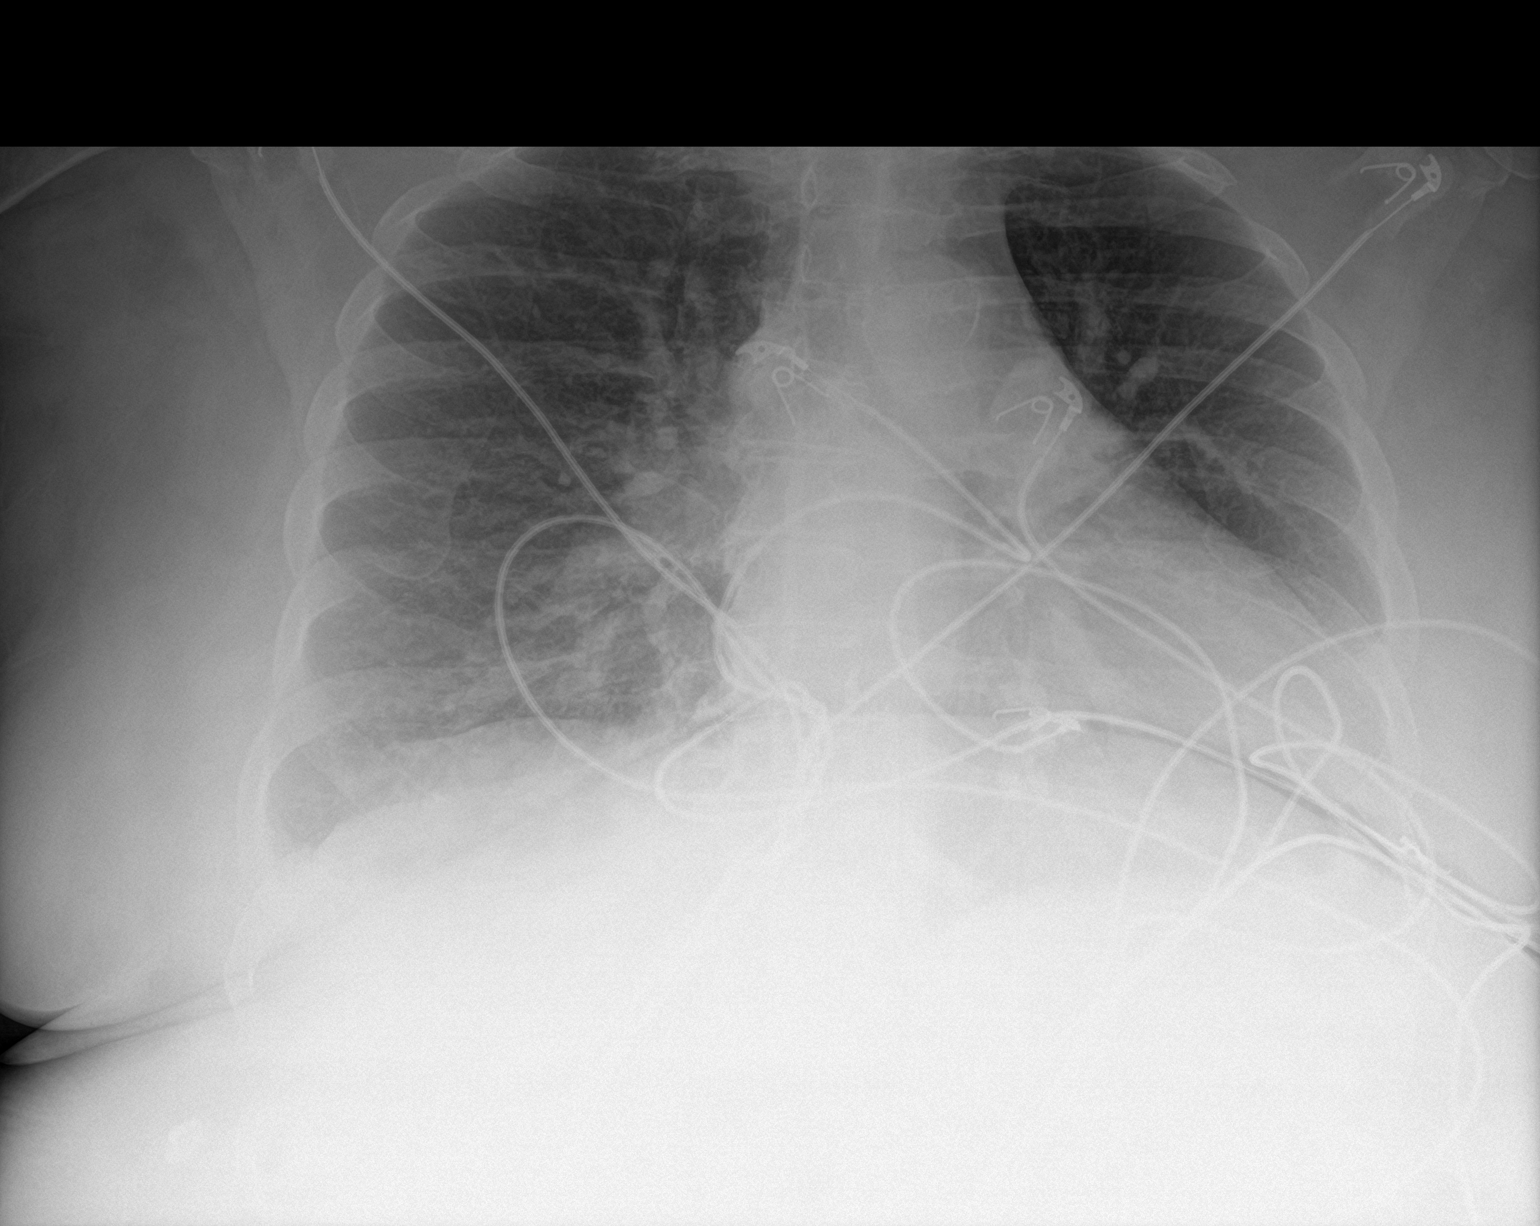

[1 of 1 positions shown; findings below may reference images not displayed]

FINDINGS: Similar cardiomegaly. Unchanged mediastinal contours with aortic
atherosclerosis. Small bilateral pleural effusions. There is
vascular congestion with mild interstitial opacities suspicious for
edema. No confluent airspace disease. No pneumothorax. No acute
osseous abnormalities are seen
IMPRESSION: Cardiomegaly with small pleural effusions, vascular congestion and
mild interstitial opacities suspicious for pulmonary edema. Overall
findings consistent with CHF.

## 2021-12-01 ENCOUNTER — Encounter (HOSPITAL_COMMUNITY): Payer: Medicaid Other | Admitting: Hematology

## 2021-12-06 ENCOUNTER — Ambulatory Visit: Payer: Medicaid Other | Admitting: Urology

## 2021-12-07 ENCOUNTER — Ambulatory Visit (HOSPITAL_COMMUNITY)
Admission: RE | Admit: 2021-12-07 | Discharge: 2021-12-07 | Disposition: A | Discharge status: Home / Self Care | Payer: Medicare Other | Source: Ambulatory Visit | Attending: Urology | Admitting: Urology

## 2021-12-07 ENCOUNTER — Other Ambulatory Visit: Payer: Self-pay | Admitting: Urology

## 2021-12-07 DIAGNOSIS — N2889 Other specified disorders of kidney and ureter: Secondary | ICD-10-CM

## 2021-12-08 ENCOUNTER — Encounter (HOSPITAL_COMMUNITY): Payer: Self-pay | Admitting: Hematology

## 2021-12-08 ENCOUNTER — Inpatient Hospital Stay (HOSPITAL_COMMUNITY): Payer: Medicare Other

## 2021-12-08 ENCOUNTER — Inpatient Hospital Stay (HOSPITAL_COMMUNITY): Payer: Medicare Other | Attending: Hematology | Admitting: Hematology

## 2021-12-08 VITALS — BP 183/81 | HR 88 | Temp 97.7°F | Resp 20 | Ht 69.5 in | Wt 283.9 lb

## 2021-12-08 DIAGNOSIS — I130 Hypertensive heart and chronic kidney disease with heart failure and stage 1 through stage 4 chronic kidney disease, or unspecified chronic kidney disease: Secondary | ICD-10-CM | POA: Insufficient documentation

## 2021-12-08 DIAGNOSIS — I509 Heart failure, unspecified: Secondary | ICD-10-CM | POA: Diagnosis not present

## 2021-12-08 DIAGNOSIS — Z808 Family history of malignant neoplasm of other organs or systems: Secondary | ICD-10-CM | POA: Insufficient documentation

## 2021-12-08 DIAGNOSIS — Z803 Family history of malignant neoplasm of breast: Secondary | ICD-10-CM | POA: Insufficient documentation

## 2021-12-08 DIAGNOSIS — D649 Anemia, unspecified: Secondary | ICD-10-CM | POA: Diagnosis not present

## 2021-12-08 DIAGNOSIS — E1122 Type 2 diabetes mellitus with diabetic chronic kidney disease: Secondary | ICD-10-CM | POA: Diagnosis not present

## 2021-12-08 DIAGNOSIS — N183 Chronic kidney disease, stage 3 unspecified: Secondary | ICD-10-CM | POA: Insufficient documentation

## 2021-12-08 LAB — CBC WITH DIFFERENTIAL/PLATELET
Abs Immature Granulocytes: 0.03 10*3/uL (ref 0.00–0.07)
Basophils Absolute: 0.1 10*3/uL (ref 0.0–0.1)
Basophils Relative: 1 %
Eosinophils Absolute: 0.2 10*3/uL (ref 0.0–0.5)
Eosinophils Relative: 4 %
HCT: 36 % (ref 36.0–46.0)
Hemoglobin: 11.5 g/dL — ABNORMAL LOW (ref 12.0–15.0)
Immature Granulocytes: 1 %
Lymphocytes Relative: 37 %
Lymphs Abs: 2.2 10*3/uL (ref 0.7–4.0)
MCH: 27.4 pg (ref 26.0–34.0)
MCHC: 31.9 g/dL (ref 30.0–36.0)
MCV: 85.9 fL (ref 80.0–100.0)
Monocytes Absolute: 0.5 10*3/uL (ref 0.1–1.0)
Monocytes Relative: 8 %
Neutro Abs: 2.9 10*3/uL (ref 1.7–7.7)
Neutrophils Relative %: 49 %
Platelets: 291 10*3/uL (ref 150–400)
RBC: 4.19 MIL/uL (ref 3.87–5.11)
RDW: 14.3 % (ref 11.5–15.5)
WBC: 5.8 10*3/uL (ref 4.0–10.5)
nRBC: 0 % (ref 0.0–0.2)

## 2021-12-08 LAB — RETICULOCYTES
Immature Retic Fract: 3.4 % (ref 2.3–15.9)
RBC.: 4.09 MIL/uL (ref 3.87–5.11)
Retic Count, Absolute: 52.8 10*3/uL (ref 19.0–186.0)
Retic Ct Pct: 1.3 % (ref 0.4–3.1)

## 2021-12-08 LAB — VITAMIN B12: Vitamin B-12: 722 pg/mL (ref 180–914)

## 2021-12-08 LAB — FERRITIN: Ferritin: 23 ng/mL (ref 11–307)

## 2021-12-08 LAB — LACTATE DEHYDROGENASE: LDH: 145 U/L (ref 98–192)

## 2021-12-08 LAB — FOLATE: Folate: 20.3 ng/mL (ref >5.9)

## 2021-12-08 NOTE — Patient Instructions (Addendum)
[facility] at [facility] Discharge Instructions  You were seen and examined today by Dr. Ellin Saba. Dr. Ellin Saba is a hematologist, meaning that he specializes in blood abnormalities. Dr. Ellin Saba discussed your past medical history, family history of cancers/blood conditions and the events that led to you being here today.  You were referred to Dr. Ellin Saba due to low blood counts. Dr. Ellin Saba will have blood work done today in an attempt to find the cause of your anemia.  Follow-up as scheduled.   Thank you for choosing [facility] at [facility] to provide your oncology and hematology care.  To afford each patient quality time with our provider, please arrive at least 15 minutes before your scheduled appointment time.   If you have a lab appointment with the Cancer Center please come in thru the Main Entrance and check in at the main information desk.  You need to re-schedule your appointment should you arrive 10 or more minutes late.  We strive to give you quality time with our providers, and arriving late affects you and other patients whose appointments are after yours.  Also, if you no show three or more times for appointments you may be dismissed from the clinic at the providers discretion.     Again, thank you for choosing [facility].  Our hope is that these requests will decrease the amount of time that you wait before being seen by our physicians.       _____________________________________________________________  Should you have questions after your visit to [facility], please contact our office at [phone] and follow the prompts.  Our office hours are 8:00 a.m. and 4:30 p.m. Monday - Friday.  Please note that voicemails left after 4:00 p.m. may not be returned until the following business day.  We are closed weekends and major holidays.  You do have access to a nurse 24-7, just call the main  number to the clinic [phone] and do not press any options, hold on the line and a nurse will answer the phone.    For prescription refill requests, have your pharmacy contact our office and allow 72 hours.    Due to Covid, you will need to wear a mask upon entering the hospital. If you do not have a mask, a mask will be given to you at the Main Entrance upon arrival. For doctor visits, patients may have 1 support person age 52 or older with them. For treatment visits, patients can not have anyone with them due to social distancing guidelines and our immunocompromised population.

## 2021-12-08 NOTE — Progress Notes (Signed)
[facility] [address] [place] [address]   CLINIC:  Medical Oncology/Hematology  Patient Care Team: Celene Squibb, MD as PCP - General (Internal Medicine) Satira Sark, MD as PCP - Cardiology (Cardiology) Derek Jack, MD as Medical Oncologist (Hematology)  CHIEF COMPLAINTS/PURPOSE OF CONSULTATION:  Evaluation for normocytic anemia  HISTORY OF PRESENTING ILLNESS:  Suzanne Tran 66 y.o. female is here because of normocytic anemia, at the request of Dr. Theador Hawthorne.  Today she reports feeling fair. She is not taking iron tablets; she reports she previously took iron tablets but stopped as they caused severe constipation. She reports she take a multivitamin. She reports that she reported to her PCP 2 weeks ago had lab work done through [facility] and was told her hemoglobin was at a normal level. She reports her energy has improved and returned to her baseline over the past 2 weeks. She denies history of iron infusions. She reports hot flashes occurring at night. She denies fevers, night sweats, weight loss, hematochezia, and black stools. She reports her last colonoscopy was in her 35s and that she was told it was normal at the time. She is not a vegetarian. She denies ice pica or other unusual cravings. She reports low appetite. She reports a history of CHF.   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 She denies smoking history, but she reports her father smoked. She reports her 2 sisters have had anemia. Her mother passed away from uterine cancer, one cousin had uterine cancer, and another cousin had breast cancer.    MEDICAL HISTORY:  Past Medical History:  Diagnosis Date   Charcot ankle, right    CKD (chronic kidney disease) stage 3, GFR 30-59 ml/min (HCC)    Diabetic Charcot foot (HCC)    Essential hypertension    Mixed hyperlipidemia    Nephrolithiasis    Neuropathy    OSA (obstructive sleep apnea)    Type 2 diabetes mellitus  (HCC)     SURGICAL HISTORY: Past Surgical History:  Procedure Laterality Date   ANKLE SURGERY     KNEE SURGERY     REPLACEMENT TOTAL KNEE     TONSILLECTOMY      SOCIAL HISTORY: Social History   Socioeconomic History   Marital status: Divorced    Spouse name: Not on file   Number of children: Not on file   Years of education: Not on file   Highest education level: Not on file  Occupational History   Not on file  Tobacco Use   Smoking status: Never   Smokeless tobacco: Never  Vaping Use   Vaping Use: Never used  Substance and Sexual Activity   Alcohol use: Never   Drug use: Never   Sexual activity: Not on file  Other Topics Concern   Not on file  Social History Narrative   Not on file   Social Determinants of Health   Financial Resource Strain: Not on file  Food Insecurity: Not on file  Transportation Needs: Not on file  Physical Activity: Not on file  Stress: Not on file  Social Connections: Not on file  Intimate Partner Violence: Not on file    FAMILY HISTORY: Family History  Problem Relation Age of Onset   Diabetes Mellitus II Mother    Cancer Mother    Hypertension Father    CAD Father    Diabetes Mellitus II Father    Diabetes Sister    Diabetes Brother  ALLERGIES:  is allergic to codeine, coumadin [warfarin], and dilantin [phenytoin].  MEDICATIONS:  Current Outpatient Medications  Medication Sig Dispense Refill   albuterol (VENTOLIN HFA) 108 (90 Base) MCG/ACT inhaler Inhale 2 puffs into the lungs every 6 (six) hours as needed.      amLODipine (NORVASC) 10 MG tablet Take 10 mg by mouth daily.     ascorbic acid (VITAMIN C) 500 MG tablet Vitamin C 500 mg tablet  Take 1 tablet by oral route.     aspirin EC 81 MG tablet Take 1 tablet (81 mg total) by mouth daily. Swallow whole. 90 tablet 3   B Complex Vitamins (VITAMIN B-COMPLEX) TABS Take by mouth.     brimonidine (ALPHAGAN) 0.2 % ophthalmic solution Place 1 drop into the right eye in the  morning.     carvedilol (COREG) 3.125 MG tablet Take 1 tablet (3.125 mg total) by mouth 2 (two) times daily with a meal.     Continuous Blood Gluc Sensor (FREESTYLE LIBRE 2 SENSOR) MISC Apply topically.     epoetin alfa (EPOGEN) 3000 UNIT/ML injection Inject into the skin.     furosemide (LASIX) 20 MG tablet Take 4 tablets (80 mg total) by mouth every morning AND 2 tablets (40 mg total) every evening. 90 tablet 5   insulin glargine (LANTUS) 100 UNIT/ML injection Inject 35 Units into the skin daily.     insulin lispro (HUMALOG) 100 UNIT/ML KwikPen Inject 5-10 Units into the skin 3 (three) times daily. Sliding scale     loperamide (IMODIUM) 2 MG capsule Take 2 mg by mouth every 4 (four) hours as needed.     losartan (COZAAR) 100 MG tablet Take 100 mg by mouth daily.     Multiple Vitamins-Minerals (SUPER THERA VITE M) TABS Take 1 tablet by mouth daily.     NOVOLOG FLEXPEN 100 UNIT/ML FlexPen SMARTSIG:10 Unit(s) SUB-Q 3 Times Daily     oxyCODONE (OXY IR/ROXICODONE) 5 MG immediate release tablet Take 5 mg by mouth every 4 (four) hours as needed.     potassium chloride (KLOR-CON) 10 MEQ tablet Take 10 mEq by mouth daily.     Probiotic Product (MISC INTESTINAL FLORA REGULAT) CAPS Take 1 capsule by mouth daily.     Vitamin D, Ergocalciferol, (DRISDOL) 1.25 MG (50000 UNIT) CAPS capsule Take 50,000 Units by mouth once a week.     zinc gluconate 50 MG tablet Take by mouth.     No current facility-administered medications for this visit.    REVIEW OF SYSTEMS:   Review of Systems  Constitutional:  Positive for appetite change. Negative for fatigue, fever and unexpected weight change.  Respiratory:  Positive for shortness of breath.   Gastrointestinal:  Positive for constipation and nausea. Negative for blood in stool.  Endocrine: Positive for hot flashes.  Genitourinary:  Positive for frequency.   Psychiatric/Behavioral:  The patient is nervous/anxious.   All other systems reviewed and are  negative.   PHYSICAL EXAMINATION: ECOG PERFORMANCE STATUS: 1 - Symptomatic but completely ambulatory  Vitals:   12/08/21 1200  BP: (!) 183/81  Pulse: 88  Resp: 20  Temp: 97.7 F (36.5 C)  SpO2: 95%   Filed Weights   12/08/21 1200  Weight: 283 lb 14.4 oz (128.8 kg)   Physical Exam Vitals reviewed.  Constitutional:      Appearance: Normal appearance. She is obese.  Cardiovascular:     Rate and Rhythm: Normal rate and regular rhythm.     Pulses: Normal pulses.  Heart sounds: Normal heart sounds.  Pulmonary:     Effort: Pulmonary effort is normal.     Breath sounds: Normal breath sounds.  Neurological:     General: No focal deficit present.     Mental Status: She is alert and oriented to person, place, and time.  Psychiatric:        Mood and Affect: Mood normal.        Behavior: Behavior normal.     LABORATORY DATA:  I have reviewed the data as listed Recent Results (from the past 2160 hour(s))  ECHOCARDIOGRAM COMPLETE     Status: None   Collection Time: 11/20/21  4:12 PM  Result Value Ref Range   Single Plane A2C EF 40.1 %   Single Plane A4C EF 48.1 %   Calc EF 43.6 %   AR max vel 2.20 cm2   AV Area VTI 1.99 cm2   AV Mean grad 4.0 mmHg   AV Peak grad 7.5 mmHg   Ao pk vel 1.37 m/s   AV Area mean vel 2.02 cm2   MV VTI 2.51 cm2   Area-P 1/2 4.12 cm2   S' Lateral 4.80 cm    RADIOGRAPHIC STUDIES: I have personally reviewed the radiological images as listed and agreed with the findings in the report. MR ABDOMEN WO CONTRAST  Result Date: 12/07/2021 CLINICAL DATA:  Renal mass follow-up EXAM: MRI ABDOMEN WITHOUT CONTRAST TECHNIQUE: Multiplanar multisequence MR imaging was performed without the administration of intravenous contrast. COMPARISON:  MRI abdomen 03/03/2021 FINDINGS: Study is significantly limited due to motion and lack of IV contrast. Patient refused contrast. Lower chest: No acute findings. Hepatobiliary: Liver is upper normal size with no suspicious  mass appreciated. Multiple gallstones identified with no gallbladder wall thickening or surrounding edema. No biliary ductal dilatation. Pancreas: Mildly atrophic with no focal mass or ductal dilatation identified. Spleen:  Within normal limits in size and appearance. Adrenals/Urinary Tract: Adrenal glands appear normal. The exophytic mass at the lower pole left kidney which demonstrated mild postcontrast enhancement on the previous study has mildly increased in size since previous study now measuring 3 x 3.2 x 2.8 cm. Left kidney is again moderately atrophic with multiple cysts measuring up to 1.7 cm in size as well as stable mild hydronephrosis. Stomach/Bowel: Visualized portions within the abdomen are unremarkable. Vascular/Lymphatic: No pathologically enlarged lymph nodes identified. No abdominal aortic aneurysm demonstrated. Other:  No ascites. Musculoskeletal: No suspicious bone lesions identified. IMPRESSION: 1. The exophytic mass at the lower pole left kidney has mildly increased in size since previous study. 2. No lymphadenopathy visualized. 3. Additional chronic findings as described. 4. Limited study due to motion and lack of contrast. Electronically Signed   By: Ofilia Neas M.D.   On: 12/07/2021 15:49   ECHOCARDIOGRAM COMPLETE  Result Date: 11/20/2021    ECHOCARDIOGRAM REPORT   Patient Name:   Breyana Ortego Date of Exam: 11/20/2021 Medical Rec #:  [id number]      Height:       69.5 in Accession #:    [id number]     Weight:       293.6 lb Date of Birth:  17-Oct-1955      BSA:          2.444 m Patient Age:    75 years       BP:           143/84 mmHg Patient Gender: F  HR:           68 bpm. Exam Location:  [facility] Procedure: 2D Echo, Cardiac Doppler, Color Doppler and Intracardiac            Opacification Agent Indications:    CHF  History:        Patient has prior history of Echocardiogram examinations, most                 recent 03/24/2020. CHF; Risk Factors:Hypertension, Diabetes and                  Dyslipidemia.  Sonographer:    Wenda Low Referring Phys: [id number] DANIEL R BENSIMHON  Sonographer Comments: Patient is morbidly obese. IMPRESSIONS  1. Left ventricular ejection fraction, by estimation, is 50 to 55%. The left ventricle has low normal function. The left ventricle has no regional wall motion abnormalities. There is mild concentric left ventricular hypertrophy. Left ventricular diastolic function could not be evaluated.  2. Right ventricular systolic function is normal. The right ventricular size is normal. There is normal pulmonary artery systolic pressure. The estimated right ventricular systolic pressure is 123456 mmHg.  3. The mitral valve is degenerative. No evidence of mitral valve regurgitation. No evidence of mitral stenosis. Moderate mitral annular calcification.  4. The aortic valve is tricuspid. Aortic valve regurgitation is not visualized. Aortic valve sclerosis/calcification is present, without any evidence of aortic stenosis. Comparison(s): Changes from prior study are noted. The left ventricular function has improved. FINDINGS  Left Ventricle: Left ventricular ejection fraction, by estimation, is 50 to 55%. The left ventricle has low normal function. The left ventricle has no regional wall motion abnormalities. Definity contrast agent was given IV to delineate the left ventricular endocardial borders. The left ventricular internal cavity size was normal in size. There is mild concentric left ventricular hypertrophy. Left ventricular diastolic function could not be evaluated due to mitral annular calcification (moderate  or greater). Left ventricular diastolic function could not be evaluated. Right Ventricle: The right ventricular size is normal. No increase in right ventricular wall thickness. Right ventricular systolic function is normal. There is normal pulmonary artery systolic pressure. The tricuspid regurgitant velocity is 2.36 m/s, and  with an assumed right atrial  pressure of 8 mmHg, the estimated right ventricular systolic pressure is 123456 mmHg. Left Atrium: Left atrial size was normal in size. Right Atrium: Right atrial size was normal in size. Pericardium: There is no evidence of pericardial effusion. Mitral Valve: The mitral valve is degenerative in appearance. Moderate mitral annular calcification. No evidence of mitral valve regurgitation. No evidence of mitral valve stenosis. MV peak gradient, 5.0 mmHg. The mean mitral valve gradient is 2.0 mmHg. Tricuspid Valve: The tricuspid valve is grossly normal. Tricuspid valve regurgitation is not demonstrated. No evidence of tricuspid stenosis. Aortic Valve: The aortic valve is tricuspid. Aortic valve regurgitation is not visualized. Aortic valve sclerosis/calcification is present, without any evidence of aortic stenosis. Aortic valve mean gradient measures 4.0 mmHg. Aortic valve peak gradient measures 7.5 mmHg. Aortic valve area, by VTI measures 1.99 cm. Pulmonic Valve: The pulmonic valve was grossly normal. Pulmonic valve regurgitation is not visualized. No evidence of pulmonic stenosis. Aorta: The aortic root and ascending aorta are structurally normal, with no evidence of dilitation. IAS/Shunts: The atrial septum is grossly normal.  LEFT VENTRICLE PLAX 2D LVIDd:         5.90 cm      Diastology LVIDs:         4.80 cm  LV e' medial:    5.40 cm/s LV PW:         1.30 cm      LV E/e' medial:  18.5 LV IVS:        1.30 cm      LV e' lateral:   7.65 cm/s LVOT diam:     2.00 cm      LV E/e' lateral: 13.1 LV SV:         66 LV SV Index:   27 LVOT Area:     3.14 cm  LV Volumes (MOD) LV vol d, MOD A2C: 67.5 ml LV vol d, MOD A4C: 109.0 ml LV vol s, MOD A2C: 40.4 ml LV vol s, MOD A4C: 56.6 ml LV SV MOD A2C:     27.1 ml LV SV MOD A4C:     109.0 ml LV SV MOD BP:      39.3 ml RIGHT VENTRICLE RV Basal diam:  4.20 cm RV Mid diam:    3.40 cm RV S prime:     8.70 cm/s TAPSE (M-mode): 2.3 cm LEFT ATRIUM             Index        RIGHT ATRIUM            Index LA diam:        4.70 cm 1.92 cm/m   RA Area:     18.50 cm LA Vol (A2C):   79.5 ml 32.53 ml/m  RA Volume:   46.90 ml  19.19 ml/m LA Vol (A4C):   65.8 ml 26.92 ml/m LA Biplane Vol: 78.0 ml 31.91 ml/m  AORTIC VALVE                    PULMONIC VALVE AV Area (Vmax):    2.20 cm     PV Vmax:       0.77 m/s AV Area (Vmean):   2.02 cm     PV Peak grad:  2.4 mmHg AV Area (VTI):     1.99 cm AV Vmax:           137.00 cm/s AV Vmean:          94.700 cm/s AV VTI:            0.333 m AV Peak Grad:      7.5 mmHg AV Mean Grad:      4.0 mmHg LVOT Vmax:         96.00 cm/s LVOT Vmean:        60.800 cm/s LVOT VTI:          0.211 m LVOT/AV VTI ratio: 0.63  AORTA Ao Root diam: 3.30 cm Ao Asc diam:  3.10 cm MITRAL VALVE                TRICUSPID VALVE MV Area (PHT): 4.12 cm     TR Peak grad:   22.3 mmHg MV Area VTI:   2.51 cm     TR Vmax:        236.00 cm/s MV Peak grad:  5.0 mmHg MV Mean grad:  2.0 mmHg     SHUNTS MV Vmax:       1.12 m/s     Systemic VTI:  0.21 m MV Vmean:      57.0 cm/s    Systemic Diam: 2.00 cm MV Decel Time: 184 msec MV E velocity: 100.00 cm/s MV A velocity: 57.80 cm/s MV E/A ratio:  1.73 Eleonore Chiquito MD  Electronically signed by Eleonore Chiquito MD Signature Date/Time: 11/20/2021/4:32:45 PM    Final     ASSESSMENT:  Normocytic anemia: - Patient seen at the request of Dr. Theador Hawthorne for normocytic anemia and low iron levels. - CBC (11/01/2021): Hb-10.8, ferritin-27, percent saturation-26, creatinine-1.44, SPEP was negative on 02/10/2021.  Free light chain ratio was normal with elevated kappa and lambda light chains. - She could not tolerate oral iron therapy due to severe constipation. - Denies any bleeding per rectum or melena.  No prior history of blood transfusion.  She has been taking multivitamin for years. - She reportedly had blood work drawn at Dr. Juel Burrow office 2 weeks ago and was told that her hemoglobin was normal. - Last colonoscopy was in her 34s, no polyps.  Denies any ice  pica.   Social/family history: - She worked as a Careers information officer prior to retirement.  She walks with help of walker due to multiple knee surgeries.  Non-smoker. - 2 sisters had anemia.  Mother died of aggressive type of uterine cancer.  Several female cousins on both sides of her family had breast and uterine cancers.   PLAN:  Normocytic anemia: - We will request labs done at Dr. Juel Burrow office.  She reports that she has been feeling fairly well with improvement in energy levels since the blood drawn showed normal CBC at Dr. Juel Burrow office. - As per patient's request, we will repeat her CBC, ferritin, iron panel, and look for other nutritional deficiencies including 123456, folic acid, MMA, copper. - If her ferritin is low, she will benefit from parenteral iron therapy as she could not tolerate oral iron therapy in the past.  She will follow-up in 1 week.   All questions were answered. The patient knows to call the clinic with any problems, questions or concerns.  Derek Jack, MD 12/08/21 12:41 PM  [facility] [phone]   I, Thana Ates, am acting as a scribe for Dr. Derek Jack.  I, Derek Jack MD, have reviewed the above documentation for accuracy and completeness, and I agree with the above.

## 2021-12-08 NOTE — Addendum Note (Signed)
Addended by: Horton Marshall on: 12/08/2021 01:32 PM   Modules accepted: Orders

## 2021-12-11 LAB — HAPTOGLOBIN: Haptoglobin: 193 mg/dL (ref 37–355)

## 2021-12-11 LAB — COPPER, SERUM: Copper: 101 ug/dL (ref 80–158)

## 2021-12-13 ENCOUNTER — Encounter: Payer: Self-pay | Admitting: Urology

## 2021-12-13 ENCOUNTER — Ambulatory Visit (INDEPENDENT_AMBULATORY_CARE_PROVIDER_SITE_OTHER): Payer: Medicare Other | Admitting: Urology

## 2021-12-13 VITALS — BP 145/82 | HR 84 | Ht 70.0 in | Wt 283.0 lb

## 2021-12-13 DIAGNOSIS — E559 Vitamin D deficiency, unspecified: Secondary | ICD-10-CM | POA: Diagnosis not present

## 2021-12-13 DIAGNOSIS — E782 Mixed hyperlipidemia: Secondary | ICD-10-CM | POA: Diagnosis not present

## 2021-12-13 DIAGNOSIS — D509 Iron deficiency anemia, unspecified: Secondary | ICD-10-CM | POA: Diagnosis not present

## 2021-12-13 DIAGNOSIS — I2510 Atherosclerotic heart disease of native coronary artery without angina pectoris: Secondary | ICD-10-CM

## 2021-12-13 DIAGNOSIS — E1169 Type 2 diabetes mellitus with other specified complication: Secondary | ICD-10-CM | POA: Diagnosis not present

## 2021-12-13 DIAGNOSIS — N2889 Other specified disorders of kidney and ureter: Secondary | ICD-10-CM | POA: Diagnosis not present

## 2021-12-13 NOTE — Patient Instructions (Signed)

## 2021-12-13 NOTE — Progress Notes (Signed)
12/13/2021 1:41 PM   Suzanne Tran December 23, 1955 [id number]  Referring provider: Benita Stabile, MD [address] [place],  [place] [address]  Left renal mass   HPI: Suzanne Tran is a 66yo here for followup for a left renal mass. MRI 12/07/2021 shows a stable left renal mass, 3.0cm. No flank pain. No significant LUTS. No other complaints today   PMH: Past Medical History:  Diagnosis Date   Charcot ankle, right    CKD (chronic kidney disease) stage 3, GFR 30-59 ml/min (HCC)    Diabetic Charcot foot (HCC)    Essential hypertension    Mixed hyperlipidemia    Nephrolithiasis    Neuropathy    OSA (obstructive sleep apnea)    Type 2 diabetes mellitus (HCC)     Surgical History: Past Surgical History:  Procedure Laterality Date   ANKLE SURGERY     KNEE SURGERY     REPLACEMENT TOTAL KNEE     TONSILLECTOMY      Home Medications:  Allergies as of 12/13/2021       Reactions   Codeine    Rash and oral swelling   Coumadin [warfarin] Anaphylaxis, Swelling, Rash   Dilantin [phenytoin] Other (See Comments)   seizure        Medication List        Accurate as of Dec 13, 2021  1:41 PM. If you have any questions, ask your nurse or doctor.          albuterol 108 (90 Base) MCG/ACT inhaler Commonly known as: VENTOLIN HFA Inhale 2 puffs into the lungs every 6 (six) hours as needed.   amLODipine 10 MG tablet Commonly known as: NORVASC Take 10 mg by mouth daily.   ascorbic acid 500 MG tablet Commonly known as: VITAMIN C Vitamin C 500 mg tablet  Take 1 tablet by oral route.   aspirin EC 81 MG tablet Take 1 tablet (81 mg total) by mouth daily. Swallow whole.   brimonidine 0.2 % ophthalmic solution Commonly known as: ALPHAGAN Place 1 drop into the right eye in the morning.   carvedilol 3.125 MG tablet Commonly known as: COREG Take 1 tablet (3.125 mg total) by mouth 2 (two) times daily with a meal.   epoetin alfa 3000 UNIT/ML injection Commonly known as:  EPOGEN Inject into the skin.   FreeStyle Libre 2 Sensor Misc Apply topically.   furosemide 20 MG tablet Commonly known as: LASIX Take 4 tablets (80 mg total) by mouth every morning AND 2 tablets (40 mg total) every evening.   insulin glargine 100 UNIT/ML injection Commonly known as: LANTUS Inject 35 Units into the skin daily.   insulin lispro 100 UNIT/ML KwikPen Commonly known as: HUMALOG Inject 5-10 Units into the skin 3 (three) times daily. Sliding scale   loperamide 2 MG capsule Commonly known as: IMODIUM Take 2 mg by mouth every 4 (four) hours as needed.   losartan 100 MG tablet Commonly known as: COZAAR Take 100 mg by mouth daily.   Misc Intestinal Flora Regulat Caps Take 1 capsule by mouth daily.   NovoLOG FlexPen 100 UNIT/ML FlexPen Generic drug: insulin aspart SMARTSIG:10 Unit(s) SUB-Q 3 Times Daily   oxyCODONE 5 MG immediate release tablet Commonly known as: Oxy IR/ROXICODONE Take 5 mg by mouth every 4 (four) hours as needed.   potassium chloride 10 MEQ tablet Commonly known as: KLOR-CON Take 10 mEq by mouth daily.   Super Thera Vite M Tabs Take 1 tablet by mouth daily.  Vitamin B-Complex Tabs Take by mouth.   Vitamin D (Ergocalciferol) 1.25 MG (50000 UNIT) Caps capsule Commonly known as: DRISDOL Take 50,000 Units by mouth once a week.   zinc gluconate 50 MG tablet Take by mouth.        Allergies:  Allergies  Allergen Reactions   Codeine     Rash and oral swelling    Coumadin [Warfarin] Anaphylaxis, Swelling and Rash   Dilantin [Phenytoin] Other (See Comments)    seizure     Family History: Family History  Problem Relation Age of Onset   Diabetes Mellitus II Mother    Cancer Mother    Hypertension Father    CAD Father    Diabetes Mellitus II Father    Diabetes Sister    Diabetes Brother     Social History:  reports that she has never smoked. She has never used smokeless tobacco. She reports that she does not drink alcohol and  does not use drugs.  ROS: All other review of systems were reviewed and are negative except what is noted above in HPI  Physical Exam: BP (!) 145/82   Pulse 84   Ht 5\' 10"  (1.778 m)   Wt 283 lb (128.4 kg)   BMI 40.61 kg/m   Constitutional:  Alert and oriented, No acute distress. HEENT: [place] AT, moist mucus membranes.  Trachea midline, no masses. Cardiovascular: No clubbing, cyanosis, or edema. Respiratory: Normal respiratory effort, no increased work of breathing. GI: Abdomen is soft, nontender, nondistended, no abdominal masses GU: No CVA tenderness.  Lymph: No cervical or inguinal lymphadenopathy. Skin: No rashes, bruises or suspicious lesions. Neurologic: Grossly intact, no focal deficits, moving all 4 extremities. Psychiatric: Normal mood and affect.  Laboratory Data: Lab Results  Component Value Date   WBC 5.8 12/08/2021   HGB 11.5 (L) 12/08/2021   HCT 36.0 12/08/2021   MCV 85.9 12/08/2021   PLT 291 12/08/2021    Lab Results  Component Value Date   CREATININE 1.63 (H) 01/23/2021    No results found for: PSA  No results found for: TESTOSTERONE  Lab Results  Component Value Date   HGBA1C 6.9 (H) 01/21/2021    Urinalysis    Component Value Date/Time   COLORURINE AMBER (A) 03/27/2020 1235   APPEARANCEUR CLOUDY (A) 03/27/2020 1235   LABSPEC 1.013 03/27/2020 1235   PHURINE 5.0 03/27/2020 1235   GLUCOSEU NEGATIVE 03/27/2020 1235   HGBUR LARGE (A) 03/27/2020 1235   BILIRUBINUR NEGATIVE 03/27/2020 1235   KETONESUR NEGATIVE 03/27/2020 1235   PROTEINUR 100 (A) 03/27/2020 1235   NITRITE NEGATIVE 03/27/2020 1235   LEUKOCYTESUR MODERATE (A) 03/27/2020 1235    Lab Results  Component Value Date   BACTERIA MANY (A) 03/27/2020    Pertinent Imaging: MRI 12/07/2021: Images reviewed and discussed with the patient  No results found for this or any previous visit.  Results for orders placed during the hospital encounter of 01/20/21  US Venous Img Lower Bilateral  (DVT)  Narrative CLINICAL DATA:  Bilateral lower extremity edema and right foot pain after traveling for 3 days.  EXAM: BILATERAL LOWER EXTREMITY VENOUS DOPPLER ULTRASOUND  TECHNIQUE: Gray-scale sonography with compression, as well as color and duplex ultrasound, were performed to evaluate the deep venous system(s) from the level of the common femoral vein through the popliteal and proximal calf veins.  COMPARISON:  None.  FINDINGS: VENOUS  Normal compressibility of the common femoral, superficial femoral, and popliteal veins, as well as the visualized calf veins. Visualized portions  of profunda femoral vein and great saphenous vein unremarkable. No filling defects to suggest DVT on grayscale or color Doppler imaging. Doppler waveforms show normal direction of venous flow, normal respiratory plasticity and response to augmentation.  OTHER  4.2 x 1.6 x 1.7 cm cystic structure in the left popliteal fossa is likely a Baker cyst.  Limitations: none  IMPRESSION: Negative.   Electronically Signed By: Miachel Roux M.D. On: 01/21/2021 12:20  No results found for this or any previous visit.  No results found for this or any previous visit.  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  EXAM: RENAL / URINARY TRACT ULTRASOUND COMPLETE  COMPARISON:  MRI dated March 03, 2021; ultrasound dated December 28, 2020  FINDINGS: Right Kidney:  Renal measurements: 13.9 x 7.4 x 7.7 cm = volume: 416 mL. Echogenicity within normal limits. No mass or hydronephrosis visualized.  Left Kidney:  Renal measurements: 13.9 x 5.9 x 5.8 cm = volume: 251 mL. Echogenicity within normal limits. Revisualization of an exophytic solid-appearing LEFT renal mass in the inferior pole. It measures at least 3.5 x 3.2 by 3.6  cm, previously 2.7 x 2.6 by 3.0 cm. There is an additional exophytic mass in the interpolar region which measures approximately 15 x 14 x 13 mm, most consistent with previously characterized benign cyst. No definitive hydronephrosis is visualized on today's exam.  Bladder:  Appears normal for degree of bladder distention.  Other:  None.  IMPRESSION: 1. Increase in size of a previously characterized LEFT sided likely renal cell carcinoma. It now measures approximately 3.6 cm. 2. No significant LEFT-sided hydronephrosis is noted on today's exam.   Electronically Signed By: Valentino Saxon M.D. On: 11/03/2021 16:31  No results found for this or any previous visit.  No results found for this or any previous visit.  No results found for this or any previous visit.   Assessment & Plan:    1. Renal mass -Continue active surveillance. RTC 6 months with a renal US.    No follow-ups on file.  Nicolette Bang, MD  [facility] [place]

## 2021-12-14 LAB — METHYLMALONIC ACID, SERUM: Methylmalonic Acid, Quantitative: 314 nmol/L (ref 0–378)

## 2021-12-15 ENCOUNTER — Inpatient Hospital Stay (HOSPITAL_COMMUNITY): Payer: Medicare Other | Attending: Physician Assistant | Admitting: Physician Assistant

## 2021-12-15 ENCOUNTER — Inpatient Hospital Stay (HOSPITAL_COMMUNITY): Payer: Medicare Other

## 2021-12-15 VITALS — BP 147/66 | HR 73 | Temp 98.3°F | Resp 18

## 2021-12-15 VITALS — BP 143/66 | HR 82 | Temp 98.0°F | Resp 18 | Ht 69.5 in | Wt 285.1 lb

## 2021-12-15 DIAGNOSIS — D509 Iron deficiency anemia, unspecified: Secondary | ICD-10-CM | POA: Insufficient documentation

## 2021-12-15 DIAGNOSIS — K5900 Constipation, unspecified: Secondary | ICD-10-CM | POA: Insufficient documentation

## 2021-12-15 DIAGNOSIS — N183 Chronic kidney disease, stage 3 unspecified: Secondary | ICD-10-CM | POA: Insufficient documentation

## 2021-12-15 DIAGNOSIS — I129 Hypertensive chronic kidney disease with stage 1 through stage 4 chronic kidney disease, or unspecified chronic kidney disease: Secondary | ICD-10-CM | POA: Diagnosis not present

## 2021-12-15 DIAGNOSIS — R0609 Other forms of dyspnea: Secondary | ICD-10-CM | POA: Insufficient documentation

## 2021-12-15 DIAGNOSIS — E1122 Type 2 diabetes mellitus with diabetic chronic kidney disease: Secondary | ICD-10-CM | POA: Diagnosis not present

## 2021-12-15 DIAGNOSIS — R600 Localized edema: Secondary | ICD-10-CM | POA: Diagnosis not present

## 2021-12-15 DIAGNOSIS — R0602 Shortness of breath: Secondary | ICD-10-CM | POA: Insufficient documentation

## 2021-12-15 DIAGNOSIS — R5383 Other fatigue: Secondary | ICD-10-CM | POA: Diagnosis not present

## 2021-12-15 DIAGNOSIS — D508 Other iron deficiency anemias: Secondary | ICD-10-CM

## 2021-12-15 DIAGNOSIS — D649 Anemia, unspecified: Secondary | ICD-10-CM

## 2021-12-15 MED ORDER — SODIUM CHLORIDE 0.9 % IV SOLN: Freq: Once | INTRAVENOUS | Status: AC

## 2021-12-15 MED ORDER — SODIUM CHLORIDE 0.9 % IV SOLN
510.0000 mg | Freq: Once | INTRAVENOUS | Status: AC
  Administered 2021-12-15: 510 mg via INTRAVENOUS
  Filled 2021-12-15: qty 17

## 2021-12-15 MED ORDER — ACETAMINOPHEN 325 MG PO TABS
650.0000 mg | ORAL_TABLET | Freq: Once | ORAL | Status: AC
  Administered 2021-12-15: 650 mg via ORAL
  Filled 2021-12-15: qty 2

## 2021-12-15 NOTE — Progress Notes (Addendum)
[place] [place], [place] [address]   CLINIC:  Medical Oncology/Hematology  PCP:  Celene Squibb, MD [place] [address] [phone]   REASON FOR VISIT:  Follow-up for iron deficiency anemia  PRIOR THERAPY: Oral iron tablets  CURRENT THERAPY: Intermittent IV iron infusions  INTERVAL HISTORY:  Suzanne Tran 66 y.o. female returns for routine follow-up of normocytic anemia and iron deficiency.  She was seen for initial consultation by Dr. Delton Coombes on 12/08/2021.  At today's visit, she reports feeling poorly.  Patient reports that she had intermittent nosebleeds 2-3 times per month for over a year, sometimes lasting up to 30 minutes.  She has not had any nosebleeds since January 2023.  She reports severe fatigue and dyspnea on exertion.  She denies any pica, restless legs, headache, chest pain, lightheadedness, or syncope.  She does have some positional vertigo.  She was unable to tolerate iron pills in the past due to severe constipation.  She has little to no energy and 40% appetite. She endorses that she is maintaining a stable weight.   REVIEW OF SYSTEMS:  Review of Systems  Constitutional:  Positive for fatigue. Negative for appetite change, chills, diaphoresis, fever and unexpected weight change.  HENT:   Negative for lump/mass and nosebleeds.   Eyes:  Negative for eye problems.  Respiratory:  Positive for shortness of breath (with exertion). Negative for cough and hemoptysis.   Cardiovascular:  Positive for leg swelling (stable). Negative for chest pain and palpitations.  Gastrointestinal:  Negative for abdominal pain, blood in stool, constipation, diarrhea, nausea and vomiting.  Genitourinary:  Positive for frequency (on Lasix). Negative for hematuria.   Skin: Negative.   Neurological:  Positive for dizziness (positional vertigo) and numbness. Negative for headaches and light-headedness.  Hematological:  Does not  bruise/bleed easily.  Psychiatric/Behavioral:  Positive for sleep disturbance. The patient is nervous/anxious.      PAST MEDICAL/SURGICAL HISTORY:  Past Medical History:  Diagnosis Date   Charcot ankle, right    CKD (chronic kidney disease) stage 3, GFR 30-59 ml/min (HCC)    Diabetic Charcot foot (HCC)    Essential hypertension    Mixed hyperlipidemia    Nephrolithiasis    Neuropathy    OSA (obstructive sleep apnea)    Type 2 diabetes mellitus (HCC)    Past Surgical History:  Procedure Laterality Date   ANKLE SURGERY     KNEE SURGERY     REPLACEMENT TOTAL KNEE     TONSILLECTOMY       SOCIAL HISTORY:  Social History   Socioeconomic History   Marital status: Divorced    Spouse name: Not on file   Number of children: Not on file   Years of education: Not on file   Highest education level: Not on file  Occupational History   Not on file  Tobacco Use   Smoking status: Never   Smokeless tobacco: Never  Vaping Use   Vaping Use: Never used  Substance and Sexual Activity   Alcohol use: Never   Drug use: Never   Sexual activity: Not on file  Other Topics Concern   Not on file  Social History Narrative   Not on file   Social Determinants of Health   Financial Resource Strain: Not on file  Food Insecurity: Not on file  Transportation Needs: Not on file  Physical Activity: Not on file  Stress: Not on file  Social Connections: Not on file  Intimate Partner Violence: Not on file    FAMILY HISTORY:  Family History  Problem Relation Age of Onset   Diabetes Mellitus II Mother    Cancer Mother    Hypertension Father    CAD Father    Diabetes Mellitus II Father    Diabetes Sister    Diabetes Brother     CURRENT MEDICATIONS:  Outpatient Encounter Medications as of 12/15/2021  Medication Sig   albuterol (VENTOLIN HFA) 108 (90 Base) MCG/ACT inhaler Inhale 2 puffs into the lungs every 6 (six) hours as needed.    amLODipine (NORVASC) 10 MG tablet Take 10 mg by mouth  daily.   ascorbic acid (VITAMIN C) 500 MG tablet Vitamin C 500 mg tablet  Take 1 tablet by oral route.   aspirin EC 81 MG tablet Take 1 tablet (81 mg total) by mouth daily. Swallow whole.   B Complex Vitamins (VITAMIN B-COMPLEX) TABS Take by mouth.   brimonidine (ALPHAGAN) 0.2 % ophthalmic solution Place 1 drop into the right eye in the morning.   carvedilol (COREG) 3.125 MG tablet Take 1 tablet (3.125 mg total) by mouth 2 (two) times daily with a meal.   Continuous Blood Gluc Sensor (FREESTYLE LIBRE 2 SENSOR) MISC Apply topically.   epoetin alfa (EPOGEN) 3000 UNIT/ML injection Inject into the skin.   furosemide (LASIX) 20 MG tablet Take 4 tablets (80 mg total) by mouth every morning AND 2 tablets (40 mg total) every evening.   insulin glargine (LANTUS) 100 UNIT/ML injection Inject 35 Units into the skin daily.   insulin lispro (HUMALOG) 100 UNIT/ML KwikPen Inject 5-10 Units into the skin 3 (three) times daily. Sliding scale   loperamide (IMODIUM) 2 MG capsule Take 2 mg by mouth every 4 (four) hours as needed.   losartan (COZAAR) 100 MG tablet Take 100 mg by mouth daily.   Multiple Vitamins-Minerals (SUPER THERA VITE M) TABS Take 1 tablet by mouth daily.   NOVOLOG FLEXPEN 100 UNIT/ML FlexPen SMARTSIG:10 Unit(s) SUB-Q 3 Times Daily   oxyCODONE (OXY IR/ROXICODONE) 5 MG immediate release tablet Take 5 mg by mouth every 4 (four) hours as needed.   potassium chloride (KLOR-CON) 10 MEQ tablet Take 10 mEq by mouth daily.   Probiotic Product (MISC INTESTINAL FLORA REGULAT) CAPS Take 1 capsule by mouth daily.   Vitamin D, Ergocalciferol, (DRISDOL) 1.25 MG (50000 UNIT) CAPS capsule Take 50,000 Units by mouth once a week.   zinc gluconate 50 MG tablet Take by mouth.   No facility-administered encounter medications on file as of 12/15/2021.    ALLERGIES:  Allergies  Allergen Reactions   Codeine     Rash and oral swelling    Coumadin [Warfarin] Anaphylaxis, Swelling and Rash   Dilantin [Phenytoin]  Other (See Comments)    seizure      PHYSICAL EXAM:  ECOG PERFORMANCE STATUS: 2 - Symptomatic, <50% confined to bed  There were no vitals filed for this visit. There were no vitals filed for this visit. Physical Exam Constitutional:      Appearance: Normal appearance. She is obese.  HENT:     Head: Normocephalic and atraumatic.     Mouth/Throat:     Mouth: Mucous membranes are moist.  Eyes:     Extraocular Movements: Extraocular movements intact.     Pupils: Pupils are equal, round, and reactive to light.  Cardiovascular:     Rate and Rhythm: Normal rate and regular rhythm.     Pulses: Normal pulses.     Heart  sounds: Normal heart sounds.  Pulmonary:     Effort: Pulmonary effort is normal.     Breath sounds: Normal breath sounds. No rales.     Comments: Diminished bases.  No crackles. Abdominal:     General: Bowel sounds are normal.     Palpations: Abdomen is soft.     Tenderness: There is no abdominal tenderness.  Musculoskeletal:        General: No swelling.     Right lower leg: Edema (1+) present.     Left lower leg: Edema (1+) present.  Lymphadenopathy:     Cervical: No cervical adenopathy.  Skin:    General: Skin is warm and dry.  Neurological:     General: No focal deficit present.     Mental Status: She is alert and oriented to person, place, and time.  Psychiatric:        Mood and Affect: Mood is anxious. Affect is tearful.        Speech: Speech is tangential.        Behavior: Behavior is agitated.     Comments: Patient anxious during exam.  She was tearful throughout visit.  Frequently interrupts and speaks over provider during visit.     LABORATORY DATA:  I have reviewed the labs as listed.  CBC    Component Value Date/Time   WBC 5.8 12/08/2021 1255   RBC 4.09 12/08/2021 1255   RBC 4.19 12/08/2021 1255   HGB 11.5 (L) 12/08/2021 1255   HCT 36.0 12/08/2021 1255   PLT 291 12/08/2021 1255   MCV 85.9 12/08/2021 1255   MCH 27.4 12/08/2021 1255    MCHC 31.9 12/08/2021 1255   RDW 14.3 12/08/2021 1255   LYMPHSABS 2.2 12/08/2021 1255   MONOABS 0.5 12/08/2021 1255   EOSABS 0.2 12/08/2021 1255   BASOSABS 0.1 12/08/2021 1255      Latest Ref Rng & Units 01/23/2021    5:56 AM 01/22/2021    6:08 AM 01/21/2021    7:41 AM  CMP  Glucose 70 - 99 mg/dL 178   123   108    BUN 8 - 23 mg/dL 43   45   48    Creatinine 0.44 - 1.00 mg/dL 1.63   1.72   1.83    Sodium 135 - 145 mmol/L 137   134   133    Potassium 3.5 - 5.1 mmol/L 4.2   3.7   3.6    Chloride 98 - 111 mmol/L 104   102   100    CO2 22 - 32 mmol/L 25   22   25     Calcium 8.9 - 10.3 mg/dL 8.3   8.2   8.4    Total Protein 6.5 - 8.1 g/dL 6.2   6.5     Total Bilirubin 0.3 - 1.2 mg/dL 0.4   0.2     Alkaline Phos 38 - 126 U/L 47   46     AST 15 - 41 U/L 15   17     ALT 0 - 44 U/L 12   12       DIAGNOSTIC IMAGING:  I have independently reviewed the relevant imaging and discussed with the patient.  ASSESSMENT & PLAN: 1.  Normocytic anemia: - Patient seen at the request of Dr. Theador Hawthorne for normocytic anemia and low iron levels. - CBC (11/01/2021): Hgb 10.8, ferritin 27, iron saturation 26%.  Creatinine 1.44. SPEP was negative on 02/10/2021.  Free light chain ratio was  normal with elevated kappa and lambda light chains. - She could not tolerate oral iron therapy due to severe constipation. - Denies any bleeding per rectum or melena.  Had frequent nosebleeds for nearly a year, none since January 2023. - No prior history of blood transfusion.  She has been taking multivitamin for years. - Last colonoscopy was in her 50s, no polyps. - Symptomatic with fatigue and dyspnea on exertion. - Labs (12/08/2021) reviewed and showed Hgb 11.5/MCV 85.9, ferritin 23.  Normal reticulocytes, LDH, B12, folate, haptoglobin, MMA, copper. - PLAN: Recommend IV Feraheme x2, first dose to be given today per patient demand. We discussed that there are several formularies of intravenous iron available in the market.  We  have discussed about risk of allergic/infusion reactions including potentially life-threatening anaphylaxis with intravenous iron however these serious allergic reactions are exceedingly rare and overestimated.  In contrast to serious allergic reactions, IV iron may be associated with nonallergic infusion reactions including self-limited urticaria, palpitations, dizziness, neck and back spasm which again occur in less than 1% of the individuals and do not progress to more serious reactions. - Repeat labs and RTC in 3 months - We will consider stool cards if any sudden or severe drops in iron or hemoglobin  2.  Dyspnea and shortness of breath - She has a systolic congestive heart failure with echo from 12/03/2020 showing 45 to 50%, with echo from 11/20/2021 showing LVEF 50 to 55% - She follows with Dr. Haroldine Laws at heart failure clinic - She reports that her weight is overall stable. - Reports that her dyspnea on exertion and peripheral edema is at baseline. - She did not take her Lasix last night, but did take her morning dose today. - PLAN: Educated on compliance with diuretics.  Patient is aware of alarm symptoms that would prompt immediate medical attention.  3.  Social/family history: - She worked as a Careers information officer prior to retirement.  She walks with help of walker due to multiple knee surgeries.  Non-smoker. - 2 sisters had anemia.  Mother died of aggressive type of uterine cancer.  Several female cousins on both sides of her family had breast and uterine cancers.   All questions were answered. The patient knows to call the clinic with any problems, questions or concerns.  Medical decision making: Moderate  Time spent on visit: I spent 20 minutes counseling the patient face to face. The total time spent in the appointment was 30 minutes and more than 50% was on counseling.   Harriett Rush, PA-C  12/15/2021 8:01 PM

## 2021-12-15 NOTE — Progress Notes (Signed)
Patient seen by R. Pennington PA today. Patient added on for Feraheme. Vital signs stable. Labs printed and given to patient per patient request.    Feraheme given today per MD orders. Tolerated infusion without adverse affects. Vital signs stable. No complaints at this time. Discharged from clinic by wheel chair in stable condition. Alert and oriented x 3. F/U with [facility] as scheduled.

## 2021-12-15 NOTE — Patient Instructions (Addendum)
[facility] at [facility] Discharge Instructions  You were seen today by Rojelio Brenner PA-C for your iron deficiency anemia.  Because your iron levels are low, we will schedule you for IV iron x2 doses.  You will receive your first dose today, and your second dose next week.  FOLLOW-UP APPOINTMENT: Labs in 3 months at [facility].  Phone visit with Rojelio Brenner PA-C in 3 months, after labs.   Thank you for choosing [facility] at [facility] to provide your oncology and hematology care.  To afford each patient quality time with our provider, please arrive at least 15 minutes before your scheduled appointment time.   If you have a lab appointment with the Cancer Center please come in thru the Main Entrance and check in at the main information desk.  You need to re-schedule your appointment should you arrive 10 or more minutes late.  We strive to give you quality time with our providers, and arriving late affects you and other patients whose appointments are after yours.  Also, if you no show three or more times for appointments you may be dismissed from the clinic at the providers discretion.     Again, thank you for choosing [facility].  Our hope is that these requests will decrease the amount of time that you wait before being seen by our physicians.       _____________________________________________________________  Should you have questions after your visit to [facility], please contact our office at [phone] and follow the prompts.  Our office hours are 8:00 a.m. and 4:30 p.m. Monday - Friday.  Please note that voicemails left after 4:00 p.m. may not be returned until the following business day.  We are closed weekends and major holidays.  You do have access to a nurse 24-7, just call the main number to the clinic [phone] and do not press any options, hold on the line and a nurse will answer the phone.     For prescription refill requests, have your pharmacy contact our office and allow 72 hours.    Due to Covid, you will need to wear a mask upon entering the hospital. If you do not have a mask, a mask will be given to you at the Main Entrance upon arrival. For doctor visits, patients may have 1 support person age 64 or older with them. For treatment visits, patients can not have anyone with them due to social distancing guidelines and our immunocompromised population.

## 2021-12-15 NOTE — Patient Instructions (Signed)
[facility]  Discharge Instructions: °Thank you for choosing [facility] to provide your oncology and hematology care.  °If you have a lab appointment with the Cancer Center, please come in thru the Main Entrance and check in at the main information desk. ° °Wear comfortable clothing and clothing appropriate for easy access to any Portacath or PICC line.  ° °We strive to give you quality time with your provider. You may need to reschedule your appointment if you arrive late (15 or more minutes).  Arriving late affects you and other patients whose appointments are after yours.  Also, if you miss three or more appointments without notifying the office, you may be dismissed from the clinic at the provider’s discretion.    °  °For prescription refill requests, have your pharmacy contact our office and allow 72 hours for refills to be completed.   ° °Today you received the following chemotherapy and/or immunotherapy agents Feraheme    °  °To help prevent nausea and vomiting after your treatment, we encourage you to take your nausea medication as directed. ° °BELOW ARE SYMPTOMS THAT SHOULD BE REPORTED IMMEDIATELY: °*FEVER GREATER THAN 100.4 F (38 °C) OR HIGHER °*CHILLS OR SWEATING °*NAUSEA AND VOMITING THAT IS NOT CONTROLLED WITH YOUR NAUSEA MEDICATION °*UNUSUAL SHORTNESS OF BREATH °*UNUSUAL BRUISING OR BLEEDING °*URINARY PROBLEMS (pain or burning when urinating, or frequent urination) °*BOWEL PROBLEMS (unusual diarrhea, constipation, pain near the anus) °TENDERNESS IN MOUTH AND THROAT WITH OR WITHOUT PRESENCE OF ULCERS (sore throat, sores in mouth, or a toothache) °UNUSUAL RASH, SWELLING OR PAIN  °UNUSUAL VAGINAL DISCHARGE OR ITCHING  ° °Items with * indicate a potential emergency and should be followed up as soon as possible or go to the Emergency Department if any problems should occur. ° °Please show the CHEMOTHERAPY ALERT CARD or IMMUNOTHERAPY ALERT CARD at check-in to the Emergency  Department and triage nurse. ° °Should you have questions after your visit or need to cancel or reschedule your appointment, please contact [facility] [phone]  and follow the prompts.  Office hours are 8:00 a.m. to 4:30 p.m. Monday - Friday. Please note that voicemails left after 4:00 p.m. may not be returned until the following business day.  We are closed weekends and major holidays. You have access to a nurse at all times for urgent questions. Please call the main number to the clinic [phone] and follow the prompts. ° °For any non-urgent questions, you may also contact your provider using MyChart. We now offer e-Visits for anyone 18 and older to request care online for non-urgent symptoms. For details visit [email or web address]. °  °Also download the MyChart app! Go to the app store, search "MyChart", open the app, select [facility], and log in with your MyChart username and password. ° °Due to Covid, a mask is required upon entering the hospital/clinic. If you do not have a mask, one will be given to you upon arrival. For doctor visits, patients may have 1 support person aged 18 or older with them. For treatment visits, patients cannot have anyone with them due to current Covid guidelines and our immunocompromised population.  °

## 2021-12-20 DIAGNOSIS — M14679 Charcot's joint, unspecified ankle and foot: Secondary | ICD-10-CM | POA: Diagnosis not present

## 2021-12-20 DIAGNOSIS — E782 Mixed hyperlipidemia: Secondary | ICD-10-CM | POA: Diagnosis not present

## 2021-12-20 DIAGNOSIS — R918 Other nonspecific abnormal finding of lung field: Secondary | ICD-10-CM | POA: Diagnosis not present

## 2021-12-20 DIAGNOSIS — E113299 Type 2 diabetes mellitus with mild nonproliferative diabetic retinopathy without macular edema, unspecified eye: Secondary | ICD-10-CM | POA: Diagnosis not present

## 2021-12-20 DIAGNOSIS — N1832 Chronic kidney disease, stage 3b: Secondary | ICD-10-CM | POA: Diagnosis not present

## 2021-12-20 DIAGNOSIS — I5032 Chronic diastolic (congestive) heart failure: Secondary | ICD-10-CM | POA: Diagnosis not present

## 2021-12-20 DIAGNOSIS — E13621 Other specified diabetes mellitus with foot ulcer: Secondary | ICD-10-CM | POA: Diagnosis not present

## 2021-12-20 DIAGNOSIS — E559 Vitamin D deficiency, unspecified: Secondary | ICD-10-CM | POA: Diagnosis not present

## 2021-12-20 DIAGNOSIS — D509 Iron deficiency anemia, unspecified: Secondary | ICD-10-CM | POA: Diagnosis not present

## 2021-12-20 DIAGNOSIS — N060 Isolated proteinuria with minor glomerular abnormality: Secondary | ICD-10-CM | POA: Diagnosis not present

## 2021-12-20 DIAGNOSIS — I420 Dilated cardiomyopathy: Secondary | ICD-10-CM | POA: Diagnosis not present

## 2021-12-22 ENCOUNTER — Inpatient Hospital Stay (HOSPITAL_COMMUNITY): Payer: Medicare Other

## 2021-12-22 VITALS — BP 145/65 | HR 65 | Temp 98.7°F | Resp 18

## 2021-12-22 DIAGNOSIS — D509 Iron deficiency anemia, unspecified: Secondary | ICD-10-CM | POA: Diagnosis not present

## 2021-12-22 DIAGNOSIS — N183 Chronic kidney disease, stage 3 unspecified: Secondary | ICD-10-CM | POA: Diagnosis not present

## 2021-12-22 DIAGNOSIS — R5383 Other fatigue: Secondary | ICD-10-CM | POA: Diagnosis not present

## 2021-12-22 DIAGNOSIS — R0609 Other forms of dyspnea: Secondary | ICD-10-CM | POA: Diagnosis not present

## 2021-12-22 DIAGNOSIS — K5900 Constipation, unspecified: Secondary | ICD-10-CM | POA: Diagnosis not present

## 2021-12-22 DIAGNOSIS — D649 Anemia, unspecified: Secondary | ICD-10-CM

## 2021-12-22 DIAGNOSIS — R0602 Shortness of breath: Secondary | ICD-10-CM | POA: Diagnosis not present

## 2021-12-22 MED ORDER — METHYLPREDNISOLONE SODIUM SUCC 125 MG IJ SOLR
125.0000 mg | Freq: Once | INTRAMUSCULAR | Status: AC
  Administered 2021-12-22: 125 mg via INTRAVENOUS
  Filled 2021-12-22: qty 2

## 2021-12-22 MED ORDER — SODIUM CHLORIDE 0.9 % IV SOLN
510.0000 mg | Freq: Once | INTRAVENOUS | Status: AC
  Administered 2021-12-22: 510 mg via INTRAVENOUS
  Filled 2021-12-22: qty 510

## 2021-12-22 MED ORDER — SODIUM CHLORIDE 0.9 % IV SOLN: Freq: Once | INTRAVENOUS | Status: AC

## 2021-12-22 MED ORDER — ACETAMINOPHEN 325 MG PO TABS
650.0000 mg | ORAL_TABLET | Freq: Once | ORAL | Status: AC
  Administered 2021-12-22: 650 mg via ORAL
  Filled 2021-12-22: qty 2

## 2021-12-22 MED ORDER — FAMOTIDINE IN NACL 20-0.9 MG/50ML-% IV SOLN
20.0000 mg | Freq: Once | INTRAVENOUS | Status: AC
  Administered 2021-12-22: 20 mg via INTRAVENOUS

## 2021-12-22 MED ORDER — LORATADINE 10 MG PO TABS
10.0000 mg | ORAL_TABLET | Freq: Once | ORAL | Status: AC
  Administered 2021-12-22: 10 mg via ORAL
  Filled 2021-12-22: qty 1

## 2021-12-22 NOTE — Patient Instructions (Signed)
[facility]  Discharge Instructions: ?Thank you for choosing [facility] to provide your oncology and hematology care.  ?If you have a lab appointment with the Cancer Center, please come in thru the Main Entrance and check in at the main information desk. ? ?Wear comfortable clothing and clothing appropriate for easy access to any Portacath or PICC line.  ? ?We strive to give you quality time with your provider. You may need to reschedule your appointment if you arrive late (15 or more minutes).  Arriving late affects you and other patients whose appointments are after yours.  Also, if you miss three or more appointments without notifying the office, you may be dismissed from the clinic at the provider?s discretion.    ?  ?For prescription refill requests, have your pharmacy contact our office and allow 72 hours for refills to be completed.   ? ?Today you received the following Feraheme, return as scheduled. ?  ?To help prevent nausea and vomiting after your treatment, we encourage you to take your nausea medication as directed. ? ?BELOW ARE SYMPTOMS THAT SHOULD BE REPORTED IMMEDIATELY: ?*FEVER GREATER THAN 100.4 F (38 ?C) OR HIGHER ?*CHILLS OR SWEATING ?*NAUSEA AND VOMITING THAT IS NOT CONTROLLED WITH YOUR NAUSEA MEDICATION ?*UNUSUAL SHORTNESS OF BREATH ?*UNUSUAL BRUISING OR BLEEDING ?*URINARY PROBLEMS (pain or burning when urinating, or frequent urination) ?*BOWEL PROBLEMS (unusual diarrhea, constipation, pain near the anus) ?TENDERNESS IN MOUTH AND THROAT WITH OR WITHOUT PRESENCE OF ULCERS (sore throat, sores in mouth, or a toothache) ?UNUSUAL RASH, SWELLING OR PAIN  ?UNUSUAL VAGINAL DISCHARGE OR ITCHING  ? ?Items with * indicate a potential emergency and should be followed up as soon as possible or go to the Emergency Department if any problems should occur. ? ?Please show the CHEMOTHERAPY ALERT CARD or IMMUNOTHERAPY ALERT CARD at check-in to the Emergency Department and triage  nurse. ? ?Should you have questions after your visit or need to cancel or reschedule your appointment, please contact [facility] [phone]  and follow the prompts.  Office hours are 8:00 a.m. to 4:30 p.m. Monday - Friday. Please note that voicemails left after 4:00 p.m. may not be returned until the following business day.  We are closed weekends and major holidays. You have access to a nurse at all times for urgent questions. Please call the main number to the clinic [phone] and follow the prompts. ? ?For any non-urgent questions, you may also contact your provider using MyChart. We now offer e-Visits for anyone 18 and older to request care online for non-urgent symptoms. For details visit [email or web address]. ?  ?Also download the MyChart app! Go to the app store, search "MyChart", open the app, select [facility], and log in with your MyChart username and password. ? ?Due to Covid, a mask is required upon entering the hospital/clinic. If you do not have a mask, one will be given to you upon arrival. For doctor visits, patients may have 1 support person aged 18 or older with them. For treatment visits, patients cannot have anyone with them due to current Covid guidelines and our immunocompromised population.  ?

## 2021-12-22 NOTE — Progress Notes (Signed)
Patient presents today for [facility], patient reports having severe back pain 1 day post Iron infusion that lasted for 2 days. Rojelio Brenner, PA made aware and pre-medications added to patients plan. Patient tolerated iron infusion with no complaints voiced. Peripheral IV site clean and dry with good blood return noted before and after infusion. Band aid applied. VSS with discharge and left in satisfactory condition with no s/s of distress noted.

## 2021-12-23 DIAGNOSIS — E1122 Type 2 diabetes mellitus with diabetic chronic kidney disease: Secondary | ICD-10-CM | POA: Diagnosis not present

## 2021-12-23 DIAGNOSIS — D638 Anemia in other chronic diseases classified elsewhere: Secondary | ICD-10-CM | POA: Diagnosis not present

## 2021-12-23 DIAGNOSIS — E211 Secondary hyperparathyroidism, not elsewhere classified: Secondary | ICD-10-CM | POA: Diagnosis not present

## 2021-12-23 DIAGNOSIS — N189 Chronic kidney disease, unspecified: Secondary | ICD-10-CM | POA: Diagnosis not present

## 2021-12-23 DIAGNOSIS — I129 Hypertensive chronic kidney disease with stage 1 through stage 4 chronic kidney disease, or unspecified chronic kidney disease: Secondary | ICD-10-CM | POA: Diagnosis not present

## 2021-12-23 DIAGNOSIS — I5022 Chronic systolic (congestive) heart failure: Secondary | ICD-10-CM | POA: Diagnosis not present

## 2021-12-23 DIAGNOSIS — R808 Other proteinuria: Secondary | ICD-10-CM | POA: Diagnosis not present

## 2022-01-10 DIAGNOSIS — L97402 Non-pressure chronic ulcer of unspecified heel and midfoot with fat layer exposed: Secondary | ICD-10-CM | POA: Diagnosis not present

## 2022-01-10 DIAGNOSIS — E11621 Type 2 diabetes mellitus with foot ulcer: Secondary | ICD-10-CM | POA: Diagnosis not present

## 2022-01-10 DIAGNOSIS — E1142 Type 2 diabetes mellitus with diabetic polyneuropathy: Secondary | ICD-10-CM | POA: Diagnosis not present

## 2022-01-10 DIAGNOSIS — E11610 Type 2 diabetes mellitus with diabetic neuropathic arthropathy: Secondary | ICD-10-CM | POA: Diagnosis not present

## 2022-02-07 DIAGNOSIS — E11621 Type 2 diabetes mellitus with foot ulcer: Secondary | ICD-10-CM | POA: Diagnosis not present

## 2022-02-07 DIAGNOSIS — E11610 Type 2 diabetes mellitus with diabetic neuropathic arthropathy: Secondary | ICD-10-CM | POA: Diagnosis not present

## 2022-02-07 DIAGNOSIS — L97402 Non-pressure chronic ulcer of unspecified heel and midfoot with fat layer exposed: Secondary | ICD-10-CM | POA: Diagnosis not present

## 2022-02-14 DIAGNOSIS — E11621 Type 2 diabetes mellitus with foot ulcer: Secondary | ICD-10-CM | POA: Diagnosis not present

## 2022-02-14 DIAGNOSIS — L97402 Non-pressure chronic ulcer of unspecified heel and midfoot with fat layer exposed: Secondary | ICD-10-CM | POA: Diagnosis not present

## 2022-02-14 DIAGNOSIS — E11610 Type 2 diabetes mellitus with diabetic neuropathic arthropathy: Secondary | ICD-10-CM | POA: Diagnosis not present

## 2022-02-27 DIAGNOSIS — E11621 Type 2 diabetes mellitus with foot ulcer: Secondary | ICD-10-CM | POA: Diagnosis not present

## 2022-02-27 DIAGNOSIS — L97402 Non-pressure chronic ulcer of unspecified heel and midfoot with fat layer exposed: Secondary | ICD-10-CM | POA: Diagnosis not present

## 2022-03-06 DIAGNOSIS — L089 Local infection of the skin and subcutaneous tissue, unspecified: Secondary | ICD-10-CM | POA: Diagnosis not present

## 2022-03-06 DIAGNOSIS — E11628 Type 2 diabetes mellitus with other skin complications: Secondary | ICD-10-CM | POA: Diagnosis not present

## 2022-03-06 DIAGNOSIS — L97409 Non-pressure chronic ulcer of unspecified heel and midfoot with unspecified severity: Secondary | ICD-10-CM | POA: Diagnosis not present

## 2022-03-06 DIAGNOSIS — E11621 Type 2 diabetes mellitus with foot ulcer: Secondary | ICD-10-CM | POA: Diagnosis not present

## 2022-03-06 DIAGNOSIS — E1143 Type 2 diabetes mellitus with diabetic autonomic (poly)neuropathy: Secondary | ICD-10-CM | POA: Diagnosis not present

## 2022-03-14 DIAGNOSIS — E11621 Type 2 diabetes mellitus with foot ulcer: Secondary | ICD-10-CM | POA: Diagnosis not present

## 2022-03-14 DIAGNOSIS — L97409 Non-pressure chronic ulcer of unspecified heel and midfoot with unspecified severity: Secondary | ICD-10-CM | POA: Diagnosis not present

## 2022-03-16 ENCOUNTER — Telehealth (HOSPITAL_COMMUNITY): Payer: Self-pay | Admitting: Internal Medicine

## 2022-03-16 NOTE — Telephone Encounter (Signed)
Left detailed vm for pt and requested a return call.

## 2022-03-16 NOTE — Telephone Encounter (Signed)
Pt request results for testing she had months ago, please advise

## 2022-03-21 DIAGNOSIS — E559 Vitamin D deficiency, unspecified: Secondary | ICD-10-CM | POA: Diagnosis not present

## 2022-03-21 DIAGNOSIS — E11610 Type 2 diabetes mellitus with diabetic neuropathic arthropathy: Secondary | ICD-10-CM | POA: Diagnosis not present

## 2022-03-21 DIAGNOSIS — E11621 Type 2 diabetes mellitus with foot ulcer: Secondary | ICD-10-CM | POA: Diagnosis not present

## 2022-03-21 DIAGNOSIS — L97409 Non-pressure chronic ulcer of unspecified heel and midfoot with unspecified severity: Secondary | ICD-10-CM | POA: Diagnosis not present

## 2022-03-21 DIAGNOSIS — E1169 Type 2 diabetes mellitus with other specified complication: Secondary | ICD-10-CM | POA: Diagnosis not present

## 2022-03-21 DIAGNOSIS — I10 Essential (primary) hypertension: Secondary | ICD-10-CM | POA: Diagnosis not present

## 2022-03-21 DIAGNOSIS — E1143 Type 2 diabetes mellitus with diabetic autonomic (poly)neuropathy: Secondary | ICD-10-CM | POA: Diagnosis not present

## 2022-03-21 DIAGNOSIS — E782 Mixed hyperlipidemia: Secondary | ICD-10-CM | POA: Diagnosis not present

## 2022-03-22 ENCOUNTER — Telehealth (HOSPITAL_COMMUNITY): Payer: Self-pay | Admitting: *Deleted

## 2022-03-22 NOTE — Telephone Encounter (Signed)
Pt called stating she had not received echo results from May or a call from Dr.Bensimhon. Pt said she was told she had an appt in September. Pt said she has called multiple times but only left one message. Pt said she received my return call but she missed it and did not attempt to call back. I explained to pt we do not call back normal results but she could have called and left a vm and we wouldve returned her call. I also told pt her recall was in September she was not actually scheduled for an appt at her last office visit. Pt has an appt scheduled for October. I apologized to pt and explained her results were normal and she has a 6 month follow up but if she has any issues she can always call and if we dont answer leave a detailed vm and we will call her back. Pt thanked me for being kind and explaining everything to her.   Pt said she has been having an issue for about a week her heart rate goes to the 100s when she stands up. Pts foot is in a cast and she said she struggles standing up. I told pt I would forward her message to Dr.Bensimhon for advice and call her back.

## 2022-03-22 NOTE — Progress Notes (Signed)
   Virtual Visit via Telephone Note [facility]  Patient was scheduled for telephone visit today to follow-up on her iron deficiency anemia.  She had been instructed to check labs prior to follow-up visit, but she preferred to have these labs checked at PCP.  Unfortunately, PCP did not check the necessary labs for this visit.  Therefore, today's visit was NOT COMPLETED.  Although nurse called and spoke with the patient, I did not personally speak with the patient and therefore will not bill for this encounter.  Patient will be scheduled for follow-up labs (CBC + ferritin + iron/TIBC) and phone visit next week.  Carnella Guadalajara, PA-C  03/23/22 12:22 PM

## 2022-03-23 ENCOUNTER — Inpatient Hospital Stay: Payer: Medicare Other | Attending: Physician Assistant | Admitting: Physician Assistant

## 2022-03-23 DIAGNOSIS — D631 Anemia in chronic kidney disease: Secondary | ICD-10-CM | POA: Insufficient documentation

## 2022-03-23 DIAGNOSIS — D509 Iron deficiency anemia, unspecified: Secondary | ICD-10-CM | POA: Insufficient documentation

## 2022-03-23 DIAGNOSIS — N1832 Chronic kidney disease, stage 3b: Secondary | ICD-10-CM | POA: Insufficient documentation

## 2022-03-23 NOTE — Telephone Encounter (Signed)
Pt said she does not want to see Dr.McDowell anymore. Pt asked if she needed to make a follow up with CHF clinic or can we refer her to another cardiologist.

## 2022-03-27 NOTE — Progress Notes (Unsigned)
Virtual Visit via Telephone Note [facility]  I connected with Suzanne Tran  on 03/28/22 at  1:13 PM by telephone and verified that I am speaking with the correct person using two identifiers.  Location: Patient: Home Provider: [facility]   I discussed the limitations, risks, security and privacy concerns of performing an evaluation and management service by telephone and the availability of in person appointments. I also discussed with the patient that there may be a patient responsible charge related to this service. The patient expressed understanding and agreed to proceed.  REASON FOR VISIT:  Follow-up for iron deficiency anemia   PRIOR THERAPY: Oral iron tablets   CURRENT THERAPY: Intermittent IV iron infusions  INTERVAL HISTORY: Suzanne Tran is contacted today for follow-up of normocytic anemia and iron deficiency.  She was last seen by Rojelio Brenner PA-C on 12/15/2021.  She received Feraheme x2 on 12/15/2021 and 12/22/2021.  She had back pain after her first IV iron, therefore steroid premedications were added to her second Feraheme, but she reports that she still had severe back pain on the day following her IV iron.  At today's visit, she reports feeling poorly, which is her baseline.  Patient reports that she had intermittent nosebleeds 2-3 times per month for over a year, sometimes lasting up to 30 minutes.  She has not had any nosebleeds since January 2023.  She denies any signs of rectal bleeding or melena.  She reports ongoing severe fatigue and dyspnea on exertion.  She denies any pica, restless legs, headache, chest pain, lightheadedness, or syncope.  She does have some positional vertigo.  She was unable to tolerate iron pills in the past due to severe constipation.    She has little to no energy and 70% appetite. She endorses that she is maintaining a stable weight.    OBSERVATIONS/OBJECTIVE:  Review of Systems  Constitutional:   Positive for malaise/fatigue. Negative for chills, diaphoresis, fever and weight loss.  Respiratory:  Positive for shortness of breath (with any exertion). Negative for cough.   Cardiovascular:  Positive for leg swelling. Negative for chest pain and palpitations.  Gastrointestinal:  Negative for abdominal pain, blood in stool, melena, nausea and vomiting.  Genitourinary:  Positive for frequency.  Neurological:  Positive for tingling. Negative for dizziness and headaches.  Psychiatric/Behavioral:  The patient is nervous/anxious.     PHYSICAL EXAM (per limitations of virtual telephone visit): The patient is alert and oriented x 3, exhibiting adequate mentation, good mood, and ability to speak in full sentences and execute sound judgement.   ASSESSMENT & PLAN: 1.  Normocytic anemia (iron deficiency + CKD stage IIIb) - Patient seen at the request of Dr. Wolfgang Phoenix for normocytic anemia and low iron levels. - CBC (11/01/2021): Hgb 10.8, ferritin 27, iron saturation 26%.  Creatinine 1.70/GFR 33. SPEP was negative on 02/10/2021.  Free light chain ratio was normal with elevated kappa and lambda light chains. - Additional work-up showed normal reticulocytes, LDH, B12, folate, haptoglobin, MMA, and copper. - Denies any bleeding per rectum or melena.  Had frequent nosebleeds for nearly a year, none since January 2023.  - Last colonoscopy was in her 81s, no polyps.  - Unable to tolerate oral iron due to severe constipation  - Received IV Feraheme on 12/15/2021 and 12/22/2021.  She had back pain after her first IV iron, therefore steroid premedications were added to her second Feraheme, but she still experience severe back pain on the day following  infusion. - Symptomatic with fatigue and dyspnea on exertion, at least partially due to her underlying CHF - Most recent labs (03/28/2022) show Hgb 10.6/MCV 92.5 with ferritin 77 and iron saturation 26%. - Etiology of anemia is combination of functional iron deficiency  and anemia of CKD stage IIIb - PLAN: Due to persistent ferritin <100 in the setting of anemia of CKD stage IIIb, recommend additional IV iron (Venofer preferred since she had side effects from [place]).  However, patient refused and stated that "the benefits were not worth the pain and trauma of getting the iron" - Repeat labs and RTC in 3 months with phone visit - We will consider stool cards if any sudden or severe drops in iron or hemoglobin  - If Hgb <9.5 despite adequate iron stores, would consider starting on ESA.    2.  Social/family history: - She worked as a Scientist, water quality prior to retirement.  She walks with help of walker due to multiple knee surgeries.  Non-smoker. - 2 sisters had anemia.  Mother died of aggressive type of uterine cancer.  Several female cousins on both sides of her family had breast and uterine cancers.   FOLLOW UP INSTRUCTIONS: Labs in 3 months followed by phone visit    I discussed the assessment and treatment plan with the patient. The patient was provided an opportunity to ask questions and all were answered. The patient agreed with the plan and demonstrated an understanding of the instructions.   The patient was advised to call back or seek an in-person evaluation if the symptoms worsen or if the condition fails to improve as anticipated.  I provided 28 minutes of non-face-to-face time during this encounter.   Carnella Guadalajara, PA-C 03/28/2022 2:05 PM

## 2022-03-27 NOTE — Addendum Note (Signed)
Addended by: Lanier Prude. on: 03/27/2022 11:59 AM   Modules accepted: Orders

## 2022-03-28 ENCOUNTER — Inpatient Hospital Stay: Payer: Medicare Other

## 2022-03-28 ENCOUNTER — Inpatient Hospital Stay (HOSPITAL_BASED_OUTPATIENT_CLINIC_OR_DEPARTMENT_OTHER): Payer: Medicare Other | Admitting: Physician Assistant

## 2022-03-28 DIAGNOSIS — N1832 Chronic kidney disease, stage 3b: Secondary | ICD-10-CM | POA: Diagnosis not present

## 2022-03-28 DIAGNOSIS — N060 Isolated proteinuria with minor glomerular abnormality: Secondary | ICD-10-CM | POA: Diagnosis not present

## 2022-03-28 DIAGNOSIS — E13621 Other specified diabetes mellitus with foot ulcer: Secondary | ICD-10-CM | POA: Diagnosis not present

## 2022-03-28 DIAGNOSIS — R002 Palpitations: Secondary | ICD-10-CM | POA: Diagnosis not present

## 2022-03-28 DIAGNOSIS — D509 Iron deficiency anemia, unspecified: Secondary | ICD-10-CM

## 2022-03-28 DIAGNOSIS — E782 Mixed hyperlipidemia: Secondary | ICD-10-CM | POA: Diagnosis not present

## 2022-03-28 DIAGNOSIS — D631 Anemia in chronic kidney disease: Secondary | ICD-10-CM

## 2022-03-28 DIAGNOSIS — I5032 Chronic diastolic (congestive) heart failure: Secondary | ICD-10-CM | POA: Diagnosis not present

## 2022-03-28 DIAGNOSIS — R918 Other nonspecific abnormal finding of lung field: Secondary | ICD-10-CM | POA: Diagnosis not present

## 2022-03-28 DIAGNOSIS — D508 Other iron deficiency anemias: Secondary | ICD-10-CM

## 2022-03-28 DIAGNOSIS — M14679 Charcot's joint, unspecified ankle and foot: Secondary | ICD-10-CM | POA: Diagnosis not present

## 2022-03-28 DIAGNOSIS — I420 Dilated cardiomyopathy: Secondary | ICD-10-CM | POA: Diagnosis not present

## 2022-03-28 DIAGNOSIS — E559 Vitamin D deficiency, unspecified: Secondary | ICD-10-CM | POA: Diagnosis not present

## 2022-03-28 DIAGNOSIS — E113299 Type 2 diabetes mellitus with mild nonproliferative diabetic retinopathy without macular edema, unspecified eye: Secondary | ICD-10-CM | POA: Diagnosis not present

## 2022-03-28 LAB — CBC WITH DIFFERENTIAL/PLATELET
Abs Immature Granulocytes: 0.04 10*3/uL (ref 0.00–0.07)
Basophils Absolute: 0.1 10*3/uL (ref 0.0–0.1)
Basophils Relative: 1 %
Eosinophils Absolute: 0.3 10*3/uL (ref 0.0–0.5)
Eosinophils Relative: 5 %
HCT: 32.1 % — ABNORMAL LOW (ref 36.0–46.0)
Hemoglobin: 10.6 g/dL — ABNORMAL LOW (ref 12.0–15.0)
Immature Granulocytes: 1 %
Lymphocytes Relative: 35 %
Lymphs Abs: 2 10*3/uL (ref 0.7–4.0)
MCH: 30.5 pg (ref 26.0–34.0)
MCHC: 33 g/dL (ref 30.0–36.0)
MCV: 92.5 fL (ref 80.0–100.0)
Monocytes Absolute: 0.4 10*3/uL (ref 0.1–1.0)
Monocytes Relative: 8 %
Neutro Abs: 2.9 10*3/uL (ref 1.7–7.7)
Neutrophils Relative %: 50 %
Platelets: 298 10*3/uL (ref 150–400)
RBC: 3.47 MIL/uL — ABNORMAL LOW (ref 3.87–5.11)
RDW: 13 % (ref 11.5–15.5)
WBC: 5.7 10*3/uL (ref 4.0–10.5)
nRBC: 0 % (ref 0.0–0.2)

## 2022-03-28 LAB — FERRITIN: Ferritin: 77 ng/mL (ref 11–307)

## 2022-03-28 LAB — IRON AND TIBC
Iron: 81 ug/dL (ref 28–170)
Saturation Ratios: 26 % (ref 10.4–31.8)
TIBC: 312 ug/dL (ref 250–450)
UIBC: 231 ug/dL

## 2022-04-04 NOTE — Telephone Encounter (Signed)
Patient asked to keep her scheduled appt with Dr.Bensimhon before being referred to general cards.

## 2022-04-11 DIAGNOSIS — E1143 Type 2 diabetes mellitus with diabetic autonomic (poly)neuropathy: Secondary | ICD-10-CM | POA: Diagnosis not present

## 2022-04-11 DIAGNOSIS — E11621 Type 2 diabetes mellitus with foot ulcer: Secondary | ICD-10-CM | POA: Diagnosis not present

## 2022-04-11 DIAGNOSIS — L97409 Non-pressure chronic ulcer of unspecified heel and midfoot with unspecified severity: Secondary | ICD-10-CM | POA: Diagnosis not present

## 2022-04-11 DIAGNOSIS — E11610 Type 2 diabetes mellitus with diabetic neuropathic arthropathy: Secondary | ICD-10-CM | POA: Diagnosis not present

## 2022-04-14 DIAGNOSIS — N189 Chronic kidney disease, unspecified: Secondary | ICD-10-CM | POA: Diagnosis not present

## 2022-04-14 DIAGNOSIS — R808 Other proteinuria: Secondary | ICD-10-CM | POA: Diagnosis not present

## 2022-04-14 DIAGNOSIS — I129 Hypertensive chronic kidney disease with stage 1 through stage 4 chronic kidney disease, or unspecified chronic kidney disease: Secondary | ICD-10-CM | POA: Diagnosis not present

## 2022-04-14 DIAGNOSIS — D638 Anemia in other chronic diseases classified elsewhere: Secondary | ICD-10-CM | POA: Diagnosis not present

## 2022-04-14 DIAGNOSIS — E1122 Type 2 diabetes mellitus with diabetic chronic kidney disease: Secondary | ICD-10-CM | POA: Diagnosis not present

## 2022-04-14 DIAGNOSIS — N2889 Other specified disorders of kidney and ureter: Secondary | ICD-10-CM | POA: Diagnosis not present

## 2022-04-14 DIAGNOSIS — I5023 Acute on chronic systolic (congestive) heart failure: Secondary | ICD-10-CM | POA: Diagnosis not present

## 2022-04-30 ENCOUNTER — Encounter (HOSPITAL_COMMUNITY): Payer: Self-pay | Admitting: Internal Medicine

## 2022-04-30 ENCOUNTER — Other Ambulatory Visit (HOSPITAL_COMMUNITY): Payer: Self-pay | Admitting: Internal Medicine

## 2022-04-30 ENCOUNTER — Ambulatory Visit (HOSPITAL_COMMUNITY)
Admission: RE | Admit: 2022-04-30 | Discharge: 2022-04-30 | Disposition: A | Discharge status: Home / Self Care | Payer: Medicare Other | Source: Ambulatory Visit | Attending: Internal Medicine | Admitting: Internal Medicine

## 2022-04-30 VITALS — BP 140/70 | HR 75 | Wt 287.4 lb

## 2022-04-30 DIAGNOSIS — G4733 Obstructive sleep apnea (adult) (pediatric): Secondary | ICD-10-CM | POA: Diagnosis not present

## 2022-04-30 DIAGNOSIS — I5032 Chronic diastolic (congestive) heart failure: Secondary | ICD-10-CM

## 2022-04-30 DIAGNOSIS — Z91148 Patient's other noncompliance with medication regimen for other reason: Secondary | ICD-10-CM | POA: Insufficient documentation

## 2022-04-30 DIAGNOSIS — R002 Palpitations: Secondary | ICD-10-CM | POA: Diagnosis not present

## 2022-04-30 DIAGNOSIS — I130 Hypertensive heart and chronic kidney disease with heart failure and stage 1 through stage 4 chronic kidney disease, or unspecified chronic kidney disease: Secondary | ICD-10-CM | POA: Diagnosis not present

## 2022-04-30 DIAGNOSIS — I2510 Atherosclerotic heart disease of native coronary artery without angina pectoris: Secondary | ICD-10-CM | POA: Insufficient documentation

## 2022-04-30 DIAGNOSIS — E1140 Type 2 diabetes mellitus with diabetic neuropathy, unspecified: Secondary | ICD-10-CM | POA: Diagnosis not present

## 2022-04-30 DIAGNOSIS — R110 Nausea: Secondary | ICD-10-CM | POA: Diagnosis not present

## 2022-04-30 DIAGNOSIS — I5022 Chronic systolic (congestive) heart failure: Secondary | ICD-10-CM | POA: Insufficient documentation

## 2022-04-30 DIAGNOSIS — Z91119 Patient's noncompliance with dietary regimen due to unspecified reason: Secondary | ICD-10-CM | POA: Insufficient documentation

## 2022-04-30 DIAGNOSIS — Z794 Long term (current) use of insulin: Secondary | ICD-10-CM | POA: Insufficient documentation

## 2022-04-30 DIAGNOSIS — N1832 Chronic kidney disease, stage 3b: Secondary | ICD-10-CM | POA: Insufficient documentation

## 2022-04-30 DIAGNOSIS — E1169 Type 2 diabetes mellitus with other specified complication: Secondary | ICD-10-CM | POA: Diagnosis not present

## 2022-04-30 DIAGNOSIS — E1122 Type 2 diabetes mellitus with diabetic chronic kidney disease: Secondary | ICD-10-CM | POA: Insufficient documentation

## 2022-04-30 DIAGNOSIS — Z79899 Other long term (current) drug therapy: Secondary | ICD-10-CM | POA: Insufficient documentation

## 2022-04-30 NOTE — Progress Notes (Signed)
ADVANCED HF CLINIC NOTE  Referring Physician: Dr. Nevada Crane Primary Care: Celene Squibb, MD Primary Cardiologist: Rozann Lesches, MD    HPI:  Suzanne Tran is a 66 y/o woman with obesity, DM2 with Charcot foot, HTN, OSA and CKD 3B referred by Dr. Nevada Crane for further evaluation for heart failure.   Saw Dr. Domenic Polite in 2021 for dyspnea. Echo 5/21 EF 45-50%. Myvoiew EF 32% with extensive breast attenuation. O/w felt to have normal perfusion. Cath recommended but she deferred. Has not followed up with Cardiology since.   Echo 9/21 EF 40%  CT chest from 3/21 reviewed personally and concern for 3v CAD  We saw her last in 3/23. Had gained 60 pounds in 60 months.   Echo 5/23 EF 50-55% Mild LVH. RV ok. RVSP 48mmHG  Here for f/u. Recently lasix decreased from 80 bid to 60 bid. Feels ok. But tired and gets SOB with walking. Says she feels her heart racing every time she stands up. No syncope. Occasional presyncope. Says she has no appetite but makes herself eat. Weight up about 5 pounds since last visit. No CP. Recently in foot cast for non-healing wound. BP at home 120/70s     Past Medical History:  Diagnosis Date   Charcot ankle, right    CKD (chronic kidney disease) stage 3, GFR 30-59 ml/min (HCC)    Diabetic Charcot foot (HCC)    Essential hypertension    Mixed hyperlipidemia    Nephrolithiasis    Neuropathy    OSA (obstructive sleep apnea)    Type 2 diabetes mellitus (HCC)     Current Outpatient Medications  Medication Sig Dispense Refill   ascorbic acid (VITAMIN C) 500 MG tablet Vitamin C 500 mg tablet  Take 1 tablet by oral route.     aspirin EC 81 MG tablet Take 1 tablet (81 mg total) by mouth daily. Swallow whole. 90 tablet 3   B Complex Vitamins (VITAMIN B-COMPLEX) TABS Take by mouth.     brimonidine (ALPHAGAN) 0.2 % ophthalmic solution Place 1 drop into the right eye in the morning.     carvedilol (COREG) 3.125 MG tablet Take 1 tablet (3.125 mg total) by mouth 2 (two) times  daily with a meal.     Continuous Blood Gluc Sensor (FREESTYLE LIBRE 2 SENSOR) MISC Apply topically.     furosemide (LASIX) 20 MG tablet Take 60 mg by mouth 2 (two) times daily.     insulin glargine (LANTUS) 100 UNIT/ML injection Inject 35 Units into the skin daily.     insulin lispro (HUMALOG) 100 UNIT/ML KwikPen Inject 5-10 Units into the skin 3 (three) times daily. Sliding scale     losartan (COZAAR) 100 MG tablet Take 100 mg by mouth daily.     Multiple Vitamins-Minerals (SUPER THERA VITE M) TABS Take 1 tablet by mouth daily.     potassium chloride (KLOR-CON) 10 MEQ tablet Take 10 mEq by mouth daily.     Probiotic Product (MISC INTESTINAL FLORA REGULAT) CAPS Take 1 capsule by mouth daily.     Vitamin D, Ergocalciferol, (DRISDOL) 1.25 MG (50000 UNIT) CAPS capsule Take 50,000 Units by mouth once a week.     zinc gluconate 50 MG tablet Take by mouth.     No current facility-administered medications for this encounter.    Allergies  Allergen Reactions   Codeine     Rash and oral swelling    Coumadin [Warfarin] Anaphylaxis, Swelling and Rash   Dilantin [Phenytoin] Other (See Comments)  seizure       Social History   Socioeconomic History   Marital status: Divorced    Spouse name: Not on file   Number of children: Not on file   Years of education: Not on file   Highest education level: Not on file  Occupational History   Not on file  Tobacco Use   Smoking status: Never   Smokeless tobacco: Never  Vaping Use   Vaping Use: Never used  Substance and Sexual Activity   Alcohol use: Never   Drug use: Never   Sexual activity: Not on file  Other Topics Concern   Not on file  Social History Narrative   Not on file   Social Determinants of Health   Financial Resource Strain: Not on file  Food Insecurity: Not on file  Transportation Needs: Not on file  Physical Activity: Not on file  Stress: Not on file  Social Connections: Not on file  Intimate Partner Violence: Not  on file      Family History  Problem Relation Age of Onset   Diabetes Mellitus II Mother    Cancer Mother    Hypertension Father    CAD Father    Diabetes Mellitus II Father    Diabetes Sister    Diabetes Brother     Vitals:   04/30/22 1453  BP: (!) 140/70  Pulse: 75  SpO2: 96%  Weight: 130.4 kg (287 lb 6.4 oz)   Wt Readings from Last 3 Encounters:  04/30/22 130.4 kg (287 lb 6.4 oz)  12/15/21 129.3 kg (285 lb 1.6 oz)  12/13/21 128.4 kg (283 lb)     PHYSICAL EXAM: General:  Obese  No resp difficulty HEENT: normal Neck: supple. no JVD. Carotids 2+ bilat; no bruits. No lymphadenopathy or thryomegaly appreciated. Cor: PMI nondisplaced. Regular rate & rhythm. No rubs, gallops or murmurs. Lungs: clear Abdomen: obese. soft, nontender, nondistended. No hepatosplenomegaly. No bruits or masses. Good bowel sounds. Extremities: no cyanosis, clubbing, rash, edema Neuro: alert & orientedx3, cranial nerves grossly intact. moves all 4 extremities w/o difficulty. Affect pleasant   ASSESSMENT & PLAN:  1. Chronic systolic HF with recovered EF - Echo 5/21 EF 45-50%.  - Myvoiew 6/21 EF 32% with extensive breast attenuation. O/w felt to have normal perfusion. Cath recommended but she deferred.  - Echo 9/21 EF 40% - Echo 5/23 EF 50-55% Mild LVH. RV ok. RVSP 75mmHG - with DM2 and associated complications at very high-risk for ischemic CM. CT chest from 3/21 reviewed personally and concern for 3v CAD - Chronic NYHA III limited by Charcot foot and body habitus - Volume status ok - Continue lasix 60 bid - Continue carvedilol 3.125 bid - Continue losartan 25 daily  - Not on SGLT2i (previously failed Farxiga due to yeast infection) - Can consider MRA as needed  2. OSA - non-compliant with CPAP - does not want to retest  3. DM2 - struggles with noncompliance with diet and insulin - hgba1c 7.1% - Consider GLP1-RA but she is concerned about side effects - (Failed SGLT2i due to yeast  infection and nausea) - Continue ARB - Needs statin will defer to PCP  4. CKD 3b - baseline SCr 1.6-1.8 - Follows with Nephrology  5. Morbid obesity - continue with weight loss efforts  6. Palpitations - place Zio patch  Glori Bickers, MD  3:41 PM

## 2022-04-30 NOTE — Patient Instructions (Signed)
There has been no changes to your medications.  Your provider has recommended that  you wear a Zio Patch for 7 days.  This monitor will record your heart rhythm for our review.  IF you have any symptoms while wearing the monitor please press the button.  If you have any issues with the patch or you notice a red or orange light on it please call the company at [phone].  Once you remove the patch please mail it back to the company as soon as possible so we can get the results.   Your physician recommends that you schedule a follow-up appointment in: 1 year ( October 2024)  ** please call the office in August 2024 to arrange your follow up appointment **  If you have any questions or concerns before your next appointment please send [place] a message through [place] or call our office at [phone].    TO LEAVE A MESSAGE FOR THE NURSE SELECT OPTION 2, PLEASE LEAVE A MESSAGE INCLUDING: YOUR NAME DATE OF BIRTH CALL BACK NUMBER REASON FOR CALL**this is important as we prioritize the call backs  YOU WILL RECEIVE A CALL BACK THE SAME DAY AS LONG AS YOU CALL BEFORE 4:00 PM  At the [facility], you and your health needs are our priority. As part of our continuing mission to provide you with exceptional heart care, we have created designated Provider Care Teams. These Care Teams include your primary Cardiologist (physician) and Advanced Practice Providers (APPs- Physician Assistants and Nurse Practitioners) who all work together to provide you with the care you need, when you need it.   You may see any of the following providers on your designated Care Team at your next follow up: Dr Glori Bickers Dr Loralie Champagne Dr. Roxana Hires, NP Lyda Jester, [place] [facility] [place], [place] Forestine Na, NP Audry Riles, PharmD   Please be sure to bring in all your medications bottles to every appointment.

## 2022-04-30 NOTE — Progress Notes (Signed)
Zio patch placed onto patient.  All instructions and information reviewed with patient, they verbalize understanding with no questions. 

## 2022-04-30 NOTE — Addendum Note (Signed)
Encounter addended by: Jerl Mina, RN on: 04/30/2022 4:08 PM  Actions taken: Clinical Note Signed

## 2022-05-31 DIAGNOSIS — R002 Palpitations: Secondary | ICD-10-CM | POA: Diagnosis not present

## 2022-06-04 NOTE — Addendum Note (Signed)
Encounter addended by: Joneisha Miles G, RN on: 06/04/2022 11:56 AM  Actions taken: Imaging Exam ended

## 2022-06-05 ENCOUNTER — Other Ambulatory Visit (HOSPITAL_COMMUNITY): Payer: Medicare Other

## 2022-06-13 ENCOUNTER — Ambulatory Visit: Payer: Medicare Other | Admitting: Urology

## 2022-06-13 DIAGNOSIS — J069 Acute upper respiratory infection, unspecified: Secondary | ICD-10-CM | POA: Diagnosis not present

## 2022-06-13 DIAGNOSIS — R093 Abnormal sputum: Secondary | ICD-10-CM | POA: Diagnosis not present

## 2022-06-18 ENCOUNTER — Other Ambulatory Visit: Payer: Self-pay

## 2022-06-18 ENCOUNTER — Emergency Department (HOSPITAL_COMMUNITY): Payer: Medicare Other

## 2022-06-18 ENCOUNTER — Encounter (HOSPITAL_COMMUNITY): Payer: Self-pay | Admitting: Emergency Medicine

## 2022-06-18 ENCOUNTER — Emergency Department (HOSPITAL_COMMUNITY)
Admission: EM | Admit: 2022-06-18 | Discharge: 2022-06-18 | Disposition: A | Discharge status: Home / Self Care | Payer: Medicare Other | Attending: Emergency Medicine | Admitting: Emergency Medicine

## 2022-06-18 DIAGNOSIS — R079 Chest pain, unspecified: Secondary | ICD-10-CM | POA: Diagnosis present

## 2022-06-18 DIAGNOSIS — N189 Chronic kidney disease, unspecified: Secondary | ICD-10-CM | POA: Insufficient documentation

## 2022-06-18 DIAGNOSIS — I130 Hypertensive heart and chronic kidney disease with heart failure and stage 1 through stage 4 chronic kidney disease, or unspecified chronic kidney disease: Secondary | ICD-10-CM | POA: Insufficient documentation

## 2022-06-18 DIAGNOSIS — Z794 Long term (current) use of insulin: Secondary | ICD-10-CM | POA: Insufficient documentation

## 2022-06-18 DIAGNOSIS — Z79899 Other long term (current) drug therapy: Secondary | ICD-10-CM | POA: Insufficient documentation

## 2022-06-18 DIAGNOSIS — I509 Heart failure, unspecified: Secondary | ICD-10-CM | POA: Insufficient documentation

## 2022-06-18 DIAGNOSIS — J811 Chronic pulmonary edema: Secondary | ICD-10-CM | POA: Diagnosis not present

## 2022-06-18 DIAGNOSIS — R0602 Shortness of breath: Secondary | ICD-10-CM | POA: Diagnosis not present

## 2022-06-18 DIAGNOSIS — I110 Hypertensive heart disease with heart failure: Secondary | ICD-10-CM | POA: Diagnosis not present

## 2022-06-18 DIAGNOSIS — Z7982 Long term (current) use of aspirin: Secondary | ICD-10-CM | POA: Diagnosis not present

## 2022-06-18 DIAGNOSIS — E1122 Type 2 diabetes mellitus with diabetic chronic kidney disease: Secondary | ICD-10-CM | POA: Diagnosis not present

## 2022-06-18 HISTORY — DX: Heart failure, unspecified: I50.9

## 2022-06-18 LAB — CBC WITH DIFFERENTIAL/PLATELET
Abs Immature Granulocytes: 0.06 10*3/uL (ref 0.00–0.07)
Basophils Absolute: 0.1 10*3/uL (ref 0.0–0.1)
Basophils Relative: 1 %
Eosinophils Absolute: 0.4 10*3/uL (ref 0.0–0.5)
Eosinophils Relative: 6 %
HCT: 30 % — ABNORMAL LOW (ref 36.0–46.0)
Hemoglobin: 9.4 g/dL — ABNORMAL LOW (ref 12.0–15.0)
Immature Granulocytes: 1 %
Lymphocytes Relative: 35 %
Lymphs Abs: 2.3 10*3/uL (ref 0.7–4.0)
MCH: 28.5 pg (ref 26.0–34.0)
MCHC: 31.3 g/dL (ref 30.0–36.0)
MCV: 90.9 fL (ref 80.0–100.0)
Monocytes Absolute: 0.5 10*3/uL (ref 0.1–1.0)
Monocytes Relative: 8 %
Neutro Abs: 3.2 10*3/uL (ref 1.7–7.7)
Neutrophils Relative %: 49 %
Platelets: 312 10*3/uL (ref 150–400)
RBC: 3.3 MIL/uL — ABNORMAL LOW (ref 3.87–5.11)
RDW: 12.8 % (ref 11.5–15.5)
WBC: 6.6 10*3/uL (ref 4.0–10.5)
nRBC: 0 % (ref 0.0–0.2)

## 2022-06-18 LAB — BRAIN NATRIURETIC PEPTIDE: B Natriuretic Peptide: 327 pg/mL — ABNORMAL HIGH (ref 0.0–100.0)

## 2022-06-18 LAB — BASIC METABOLIC PANEL
Anion gap: 9 (ref 5–15)
BUN: 51 mg/dL — ABNORMAL HIGH (ref 8–23)
CO2: 26 mmol/L (ref 22–32)
Calcium: 8.9 mg/dL (ref 8.9–10.3)
Chloride: 103 mmol/L (ref 98–111)
Creatinine, Ser: 1.91 mg/dL — ABNORMAL HIGH (ref 0.44–1.00)
GFR, Estimated: 28 mL/min — ABNORMAL LOW (ref >60)
Glucose, Bld: 123 mg/dL — ABNORMAL HIGH (ref 70–99)
Potassium: 4.3 mmol/L (ref 3.5–5.1)
Sodium: 138 mmol/L (ref 135–145)

## 2022-06-18 LAB — TROPONIN I (HIGH SENSITIVITY)
Troponin I (High Sensitivity): 21 ng/L — ABNORMAL HIGH (ref <18)
Troponin I (High Sensitivity): 21 ng/L — ABNORMAL HIGH (ref <18)

## 2022-06-18 LAB — MAGNESIUM: Magnesium: 2.2 mg/dL (ref 1.7–2.4)

## 2022-06-18 MED ORDER — FUROSEMIDE 10 MG/ML IJ SOLN
60.0000 mg | Freq: Once | INTRAMUSCULAR | Status: AC
  Administered 2022-06-18: 60 mg via INTRAVENOUS
  Filled 2022-06-18: qty 6

## 2022-06-18 NOTE — ED Triage Notes (Signed)
Pt c/o chest pressure x 3 hours and tachycardia all night with HR 112-120, and O2 in upper 80s; pt denies O2 use at home

## 2022-06-18 NOTE — ED Provider Notes (Signed)
[facility] EMERGENCY DEPARTMENT Provider Note   CSN: [id number] Arrival date & time: 06/18/22  0037     History  Chief Complaint  Patient presents with   Chest Pain    Suzanne Tran is a 66 y.o. female.  The history is provided by the patient.  Chest Pain She has history of hypertension, diabetes, hyperlipidemia, chronic kidney disease, heart failure and comes in because of difficulty breathing tonight.  She states that she has not been able to get to sleep all night.  Yesterday was a good day for her.,  But she has noted that her legs have had increased swelling over the last 2 days.  Today, her weight had gone up 7 pounds compared with yesterday.  She denies eating food with too much salt.  She does admit to very slight heavy feeling in her chest.  There has been no nausea or vomiting.  She has had a cough for the last week but no fever or chills.   Home Medications Prior to Admission medications   Medication Sig Start Date End Date Taking? Authorizing Provider  ascorbic acid (VITAMIN C) 500 MG tablet Vitamin C 500 mg tablet  Take 1 tablet by oral route.    [provider]  aspirin EC 81 MG tablet Take 1 tablet (81 mg total) by mouth daily. Swallow whole. 01/07/20   Strader, Lennart Pall, PA-C  B Complex Vitamins (VITAMIN B-COMPLEX) TABS Take by mouth.    [provider]  brimonidine (ALPHAGAN) 0.2 % ophthalmic solution Place 1 drop into the right eye in the morning.    [provider]  carvedilol (COREG) 3.125 MG tablet Take 1 tablet (3.125 mg total) by mouth 2 (two) times daily with a meal. 03/31/20   Dahal, Melina Schools, MD  Continuous Blood Gluc Sensor (FREESTYLE LIBRE 2 SENSOR) MISC Apply topically. 10/21/20   [provider]  furosemide (LASIX) 20 MG tablet Take 60 mg by mouth 2 (two) times daily.    [provider]  insulin glargine (LANTUS) 100 UNIT/ML injection Inject 35 Units into the skin daily.    [provider]  insulin  lispro (HUMALOG) 100 UNIT/ML KwikPen Inject 5-10 Units into the skin 3 (three) times daily. Sliding scale 11/27/20   [provider]  losartan (COZAAR) 100 MG tablet Take 100 mg by mouth daily.    [provider]  Multiple Vitamins-Minerals (SUPER THERA VITE M) TABS Take 1 tablet by mouth daily.    [provider]  potassium chloride (KLOR-CON) 10 MEQ tablet Take 10 mEq by mouth daily. 01/27/20   [provider]  Probiotic Product (MISC INTESTINAL FLORA REGULAT) CAPS Take 1 capsule by mouth daily.    [provider]  Vitamin D, Ergocalciferol, (DRISDOL) 1.25 MG (50000 UNIT) CAPS capsule Take 50,000 Units by mouth once a week. 11/09/20   [provider]  zinc gluconate 50 MG tablet Take by mouth.    [provider]      Allergies    Codeine, Coumadin [warfarin], and Dilantin [phenytoin]    Review of Systems   Review of Systems  Cardiovascular:  Positive for chest pain.  All other systems reviewed and are negative.   Physical Exam Updated Vital Signs BP (!) 160/77   Pulse 80   Temp 98.1 F (36.7 C) (Oral)   Resp 19   Ht 5' 9.5" (1.765 m)   Wt 127.9 kg   SpO2 96%   BMI 41.05 kg/m  Physical Exam Vitals and  nursing note reviewed.   66 year old female, resting comfortably and in no acute distress. Vital signs are significant for elevated blood pressure. Oxygen saturation is 96%, which is normal. Head is normocephalic and atraumatic. PERRLA, EOMI. Oropharynx is clear. Neck is nontender and supple without adenopathy or JVD. Back is nontender and there is no CVA tenderness.  There is 2-3+ presacral edema. Lungs have bibasilar rales about one third the way up without wheezes or rhonchi. Chest is nontender. Heart has regular rate and rhythm without murmur. Abdomen is soft, flat, nontender without masses or hepatosplenomegaly and peristalsis is normoactive. Extremities have 3+ pretibial edema, full range of motion is  present. Skin is warm and dry without rash. Neurologic: Mental status is normal, cranial nerves are intact, moves all extremities equally.  ED Results / Procedures / Treatments   Labs (all labs ordered are listed, but only abnormal results are displayed) Labs Reviewed - No data to display  EKG EKG Interpretation  Date/Time:  Monday June 18 2022 06:11:42 EST Ventricular Rate:  84 PR Interval:  137 QRS Duration: 110 QT Interval:  409 QTC Calculation: 484 R Axis:   -19 Text Interpretation: Sinus rhythm Borderline left axis deviation Nonspecific T abnormalities, lateral leads When compared with ECG of 04/30/2022, No significant change was found Confirmed by Dione Booze ([id number]) on 06/18/2022 6:17:54 AM I  Radiology DG Chest Port 1 View  Result Date: 06/18/2022 CLINICAL DATA:  Shortness of breath. EXAM: PORTABLE CHEST 1 VIEW COMPARISON:  01/21/2021 FINDINGS: Low volume film. The cardio pericardial silhouette is enlarged. There is pulmonary vascular congestion without overt pulmonary edema. Retrocardiac left base collapse/consolidation again noted. Telemetry leads overlie the chest. IMPRESSION: Low volume film with vascular congestion and retrocardiac left base collapse/consolidation. Left pleural effusion not excluded. Electronically Signed   By: Kennith Center M.D.   On: 06/18/2022 06:40    Procedures Procedures  Cardiac monitor shows normal sinus rhythm, per my interpretation.  Medications Ordered in ED Medications  furosemide (LASIX) injection 60 mg (has no administration in time range)    ED Course/ Medical Decision Making/ A&P                           Medical Decision Making Amount and/or Complexity of Data Reviewed Labs: ordered. Radiology: ordered.  Risk Prescription drug management.   Shortness of breath with peripheral edema and rales consistent with heart failure exacerbation.  No fever to suggest pneumonia although she has had a cough.  I have ordered a chest  x-ray to evaluate for evidence of pulmonary edema as well as pneumonia.  I have ordered screening labs of CBC, basic metabolic panel, BNP, troponin.  I have ordered a dose of furosemide.  I have reviewed and interpreted her electrocardiogram and my interpretation is nonspecific T wave changes which are unchanged from prior.  I have reviewed her past records, and on 11/20/2021 she had an echocardiogram showing left ventricular ejection fraction 50-55% but indeterminate diastolic parameters.  Chest x-ray shows pulmonary vascular congestion.  I have independently viewed the images, and agree with the radiologist's interpretation.  Laboratory reports are pending.  Case is signed out to Dr. Durwin Nora.  Final Clinical Impression(s) / ED Diagnoses Final diagnoses:  Acute on chronic congestive heart failure, unspecified heart failure type [facility])    Rx / DC Orders ED Discharge Orders     None         Dione Booze, MD 06/18/22 [phone]

## 2022-06-18 NOTE — ED Provider Notes (Signed)
Care of patient assumed from Dr. Preston Fleeting at 7 AM.  This patient presents for a suspected HF exacerbation.  She has a 7 pound weight gain since yesterday. Lab work is pending.  Dose of Lasix has been ordered.  Discharge is anticipated. Physical Exam  BP (!) 148/60   Pulse 83   Temp 98.1 F (36.7 C) (Oral)   Resp (!) 26   Ht 5' 9.5" (1.765 m)   Wt 127.9 kg   SpO2 92%   BMI 41.05 kg/m   Physical Exam Vitals and nursing note reviewed.  Constitutional:      General: She is not in acute distress.    Appearance: She is well-developed. She is not ill-appearing, toxic-appearing or diaphoretic.  HENT:     Head: Normocephalic and atraumatic.  Eyes:     Conjunctiva/sclera: Conjunctivae normal.  Cardiovascular:     Rate and Rhythm: Normal rate and regular rhythm.     Heart sounds: No murmur heard. Pulmonary:     Effort: Pulmonary effort is normal.  Abdominal:     Palpations: Abdomen is soft.     Tenderness: There is no abdominal tenderness.  Musculoskeletal:        General: No swelling.     Cervical back: Normal range of motion and neck supple.     Right lower leg: Edema present.     Left lower leg: Edema present.  Skin:    General: Skin is warm and dry.     Capillary Refill: Capillary refill takes less than 2 seconds.  Neurological:     General: No focal deficit present.     Mental Status: She is alert and oriented to person, place, and time.  Psychiatric:        Mood and Affect: Mood normal.        Behavior: Behavior normal.     Procedures  Procedures  ED Course / MDM    Medical Decision Making Amount and/or Complexity of Data Reviewed Labs: ordered. Radiology: ordered.  Risk Prescription drug management.   Patient's lab work shows a slight decrease in hemoglobin from 2 months ago.  BNP is moderately elevated.  Electrolytes are normal.  Creatinine is baseline.  On assessment, patient is sitting up in bed.  Breathing is unlabored.  She states that she had an episode  midway through the night where it felt like she could not take a full breath.  This did improve slightly when she sat up.  She also states that she did identify some hypoxia and tachycardia on pulse oximeter at home.  During her ED visit, she has a normal heart rate and normal SpO2 on room air.  During ED observation, patient had 600 cc of urine output in the first 3 hours after her dose of Lasix.  She has not had any recurrence of shortness of breath.  Patient was advised to continue her Lasix at home and to follow-up with her cardiologist.  She is stable for discharge at this time.      Gloris Manchester, MD 06/18/22 [phone]

## 2022-06-18 NOTE — Discharge Instructions (Addendum)
Continue taking your home Lasix as prescribed.  Follow-up with your heart doctor as soon as possible.  Return to the emergency department at anytime for any new or worsening symptoms of concern.

## 2022-06-27 ENCOUNTER — Inpatient Hospital Stay: Payer: Medicare Other

## 2022-06-27 DIAGNOSIS — E1142 Type 2 diabetes mellitus with diabetic polyneuropathy: Secondary | ICD-10-CM | POA: Diagnosis not present

## 2022-06-27 DIAGNOSIS — E11621 Type 2 diabetes mellitus with foot ulcer: Secondary | ICD-10-CM | POA: Diagnosis not present

## 2022-06-27 DIAGNOSIS — E669 Obesity, unspecified: Secondary | ICD-10-CM | POA: Diagnosis not present

## 2022-06-27 DIAGNOSIS — E11610 Type 2 diabetes mellitus with diabetic neuropathic arthropathy: Secondary | ICD-10-CM | POA: Diagnosis not present

## 2022-06-27 DIAGNOSIS — L97512 Non-pressure chronic ulcer of other part of right foot with fat layer exposed: Secondary | ICD-10-CM | POA: Diagnosis not present

## 2022-06-27 DIAGNOSIS — E1143 Type 2 diabetes mellitus with diabetic autonomic (poly)neuropathy: Secondary | ICD-10-CM | POA: Diagnosis not present

## 2022-06-27 DIAGNOSIS — L97409 Non-pressure chronic ulcer of unspecified heel and midfoot with unspecified severity: Secondary | ICD-10-CM | POA: Diagnosis not present

## 2022-07-03 ENCOUNTER — Telehealth: Payer: Medicare Other | Admitting: Physician Assistant

## 2022-07-03 DIAGNOSIS — L97529 Non-pressure chronic ulcer of other part of left foot with unspecified severity: Secondary | ICD-10-CM | POA: Diagnosis not present

## 2022-07-03 DIAGNOSIS — I5032 Chronic diastolic (congestive) heart failure: Secondary | ICD-10-CM | POA: Diagnosis not present

## 2022-07-03 DIAGNOSIS — Z79899 Other long term (current) drug therapy: Secondary | ICD-10-CM | POA: Diagnosis not present

## 2022-07-03 DIAGNOSIS — E13621 Other specified diabetes mellitus with foot ulcer: Secondary | ICD-10-CM | POA: Diagnosis not present

## 2022-07-03 DIAGNOSIS — Z599 Problem related to housing and economic circumstances, unspecified: Secondary | ICD-10-CM | POA: Diagnosis not present

## 2022-07-03 DIAGNOSIS — E559 Vitamin D deficiency, unspecified: Secondary | ICD-10-CM | POA: Diagnosis not present

## 2022-07-03 DIAGNOSIS — E1169 Type 2 diabetes mellitus with other specified complication: Secondary | ICD-10-CM | POA: Diagnosis not present

## 2022-07-03 DIAGNOSIS — I10 Essential (primary) hypertension: Secondary | ICD-10-CM | POA: Diagnosis not present

## 2022-07-03 DIAGNOSIS — E782 Mixed hyperlipidemia: Secondary | ICD-10-CM | POA: Diagnosis not present

## 2022-07-03 DIAGNOSIS — E11621 Type 2 diabetes mellitus with foot ulcer: Secondary | ICD-10-CM | POA: Diagnosis not present

## 2022-07-03 DIAGNOSIS — D509 Iron deficiency anemia, unspecified: Secondary | ICD-10-CM | POA: Diagnosis not present

## 2022-07-03 DIAGNOSIS — L97519 Non-pressure chronic ulcer of other part of right foot with unspecified severity: Secondary | ICD-10-CM | POA: Diagnosis not present

## 2022-07-10 DIAGNOSIS — E11610 Type 2 diabetes mellitus with diabetic neuropathic arthropathy: Secondary | ICD-10-CM | POA: Diagnosis not present

## 2022-07-10 DIAGNOSIS — L97402 Non-pressure chronic ulcer of unspecified heel and midfoot with fat layer exposed: Secondary | ICD-10-CM | POA: Diagnosis not present

## 2022-07-10 DIAGNOSIS — E11621 Type 2 diabetes mellitus with foot ulcer: Secondary | ICD-10-CM | POA: Diagnosis not present

## 2022-07-18 DIAGNOSIS — E1143 Type 2 diabetes mellitus with diabetic autonomic (poly)neuropathy: Secondary | ICD-10-CM | POA: Diagnosis not present

## 2022-07-18 DIAGNOSIS — E1142 Type 2 diabetes mellitus with diabetic polyneuropathy: Secondary | ICD-10-CM | POA: Diagnosis not present

## 2022-07-18 DIAGNOSIS — E669 Obesity, unspecified: Secondary | ICD-10-CM | POA: Diagnosis not present

## 2022-07-18 DIAGNOSIS — L97512 Non-pressure chronic ulcer of other part of right foot with fat layer exposed: Secondary | ICD-10-CM | POA: Diagnosis not present

## 2022-07-18 DIAGNOSIS — E11610 Type 2 diabetes mellitus with diabetic neuropathic arthropathy: Secondary | ICD-10-CM | POA: Diagnosis not present

## 2022-07-24 DIAGNOSIS — E11621 Type 2 diabetes mellitus with foot ulcer: Secondary | ICD-10-CM | POA: Diagnosis not present

## 2022-07-24 DIAGNOSIS — E113299 Type 2 diabetes mellitus with mild nonproliferative diabetic retinopathy without macular edema, unspecified eye: Secondary | ICD-10-CM | POA: Diagnosis not present

## 2022-07-24 DIAGNOSIS — I130 Hypertensive heart and chronic kidney disease with heart failure and stage 1 through stage 4 chronic kidney disease, or unspecified chronic kidney disease: Secondary | ICD-10-CM | POA: Diagnosis not present

## 2022-07-24 DIAGNOSIS — I420 Dilated cardiomyopathy: Secondary | ICD-10-CM | POA: Diagnosis not present

## 2022-07-24 DIAGNOSIS — Z0001 Encounter for general adult medical examination with abnormal findings: Secondary | ICD-10-CM | POA: Diagnosis not present

## 2022-07-24 DIAGNOSIS — E11610 Type 2 diabetes mellitus with diabetic neuropathic arthropathy: Secondary | ICD-10-CM | POA: Diagnosis not present

## 2022-07-24 DIAGNOSIS — E782 Mixed hyperlipidemia: Secondary | ICD-10-CM | POA: Diagnosis not present

## 2022-07-24 DIAGNOSIS — I5032 Chronic diastolic (congestive) heart failure: Secondary | ICD-10-CM | POA: Diagnosis not present

## 2022-07-24 DIAGNOSIS — R918 Other nonspecific abnormal finding of lung field: Secondary | ICD-10-CM | POA: Diagnosis not present

## 2022-07-24 DIAGNOSIS — N1832 Chronic kidney disease, stage 3b: Secondary | ICD-10-CM | POA: Diagnosis not present

## 2022-07-24 DIAGNOSIS — E559 Vitamin D deficiency, unspecified: Secondary | ICD-10-CM | POA: Diagnosis not present

## 2022-08-02 DIAGNOSIS — E1143 Type 2 diabetes mellitus with diabetic autonomic (poly)neuropathy: Secondary | ICD-10-CM | POA: Diagnosis not present

## 2022-08-02 DIAGNOSIS — E11610 Type 2 diabetes mellitus with diabetic neuropathic arthropathy: Secondary | ICD-10-CM | POA: Diagnosis not present

## 2022-08-02 DIAGNOSIS — E1142 Type 2 diabetes mellitus with diabetic polyneuropathy: Secondary | ICD-10-CM | POA: Diagnosis not present

## 2022-08-17 DIAGNOSIS — L97511 Non-pressure chronic ulcer of other part of right foot limited to breakdown of skin: Secondary | ICD-10-CM | POA: Diagnosis not present

## 2022-08-17 DIAGNOSIS — Z91199 Patient's noncompliance with other medical treatment and regimen due to unspecified reason: Secondary | ICD-10-CM | POA: Diagnosis not present

## 2022-08-17 DIAGNOSIS — E11610 Type 2 diabetes mellitus with diabetic neuropathic arthropathy: Secondary | ICD-10-CM | POA: Diagnosis not present

## 2022-08-17 DIAGNOSIS — E1142 Type 2 diabetes mellitus with diabetic polyneuropathy: Secondary | ICD-10-CM | POA: Diagnosis not present

## 2022-08-21 DIAGNOSIS — D638 Anemia in other chronic diseases classified elsewhere: Secondary | ICD-10-CM | POA: Diagnosis not present

## 2022-08-21 DIAGNOSIS — E211 Secondary hyperparathyroidism, not elsewhere classified: Secondary | ICD-10-CM | POA: Diagnosis not present

## 2022-08-21 DIAGNOSIS — I5022 Chronic systolic (congestive) heart failure: Secondary | ICD-10-CM | POA: Diagnosis not present

## 2022-08-21 DIAGNOSIS — E1122 Type 2 diabetes mellitus with diabetic chronic kidney disease: Secondary | ICD-10-CM | POA: Diagnosis not present

## 2022-08-21 DIAGNOSIS — N2889 Other specified disorders of kidney and ureter: Secondary | ICD-10-CM | POA: Diagnosis not present

## 2022-08-21 DIAGNOSIS — R808 Other proteinuria: Secondary | ICD-10-CM | POA: Diagnosis not present

## 2022-08-21 DIAGNOSIS — N189 Chronic kidney disease, unspecified: Secondary | ICD-10-CM | POA: Diagnosis not present

## 2022-08-21 DIAGNOSIS — I129 Hypertensive chronic kidney disease with stage 1 through stage 4 chronic kidney disease, or unspecified chronic kidney disease: Secondary | ICD-10-CM | POA: Diagnosis not present

## 2022-09-04 DIAGNOSIS — E1142 Type 2 diabetes mellitus with diabetic polyneuropathy: Secondary | ICD-10-CM | POA: Diagnosis not present

## 2022-09-04 DIAGNOSIS — L97402 Non-pressure chronic ulcer of unspecified heel and midfoot with fat layer exposed: Secondary | ICD-10-CM | POA: Diagnosis not present

## 2022-09-04 DIAGNOSIS — E11610 Type 2 diabetes mellitus with diabetic neuropathic arthropathy: Secondary | ICD-10-CM | POA: Diagnosis not present

## 2022-09-04 DIAGNOSIS — E11621 Type 2 diabetes mellitus with foot ulcer: Secondary | ICD-10-CM | POA: Diagnosis not present

## 2022-09-21 DIAGNOSIS — Z7982 Long term (current) use of aspirin: Secondary | ICD-10-CM | POA: Diagnosis not present

## 2022-09-21 DIAGNOSIS — D631 Anemia in chronic kidney disease: Secondary | ICD-10-CM | POA: Diagnosis not present

## 2022-09-21 DIAGNOSIS — E113299 Type 2 diabetes mellitus with mild nonproliferative diabetic retinopathy without macular edema, unspecified eye: Secondary | ICD-10-CM | POA: Diagnosis not present

## 2022-09-21 DIAGNOSIS — Z9181 History of falling: Secondary | ICD-10-CM | POA: Diagnosis not present

## 2022-09-21 DIAGNOSIS — E1122 Type 2 diabetes mellitus with diabetic chronic kidney disease: Secondary | ICD-10-CM | POA: Diagnosis not present

## 2022-09-21 DIAGNOSIS — N2889 Other specified disorders of kidney and ureter: Secondary | ICD-10-CM | POA: Diagnosis not present

## 2022-09-21 DIAGNOSIS — N1832 Chronic kidney disease, stage 3b: Secondary | ICD-10-CM | POA: Diagnosis not present

## 2022-09-21 DIAGNOSIS — R918 Other nonspecific abnormal finding of lung field: Secondary | ICD-10-CM | POA: Diagnosis not present

## 2022-09-21 DIAGNOSIS — D509 Iron deficiency anemia, unspecified: Secondary | ICD-10-CM | POA: Diagnosis not present

## 2022-09-21 DIAGNOSIS — G4733 Obstructive sleep apnea (adult) (pediatric): Secondary | ICD-10-CM | POA: Diagnosis not present

## 2022-09-21 DIAGNOSIS — Z6841 Body Mass Index (BMI) 40.0 and over, adult: Secondary | ICD-10-CM | POA: Diagnosis not present

## 2022-09-21 DIAGNOSIS — E11610 Type 2 diabetes mellitus with diabetic neuropathic arthropathy: Secondary | ICD-10-CM | POA: Diagnosis not present

## 2022-09-21 DIAGNOSIS — I5032 Chronic diastolic (congestive) heart failure: Secondary | ICD-10-CM | POA: Diagnosis not present

## 2022-09-21 DIAGNOSIS — E782 Mixed hyperlipidemia: Secondary | ICD-10-CM | POA: Diagnosis not present

## 2022-09-21 DIAGNOSIS — E538 Deficiency of other specified B group vitamins: Secondary | ICD-10-CM | POA: Diagnosis not present

## 2022-09-21 DIAGNOSIS — I130 Hypertensive heart and chronic kidney disease with heart failure and stage 1 through stage 4 chronic kidney disease, or unspecified chronic kidney disease: Secondary | ICD-10-CM | POA: Diagnosis not present

## 2022-09-21 DIAGNOSIS — N393 Stress incontinence (female) (male): Secondary | ICD-10-CM | POA: Diagnosis not present

## 2022-09-21 DIAGNOSIS — I420 Dilated cardiomyopathy: Secondary | ICD-10-CM | POA: Diagnosis not present

## 2022-09-21 DIAGNOSIS — E559 Vitamin D deficiency, unspecified: Secondary | ICD-10-CM | POA: Diagnosis not present

## 2022-09-21 DIAGNOSIS — Z794 Long term (current) use of insulin: Secondary | ICD-10-CM | POA: Diagnosis not present

## 2022-09-21 DIAGNOSIS — Z8616 Personal history of COVID-19: Secondary | ICD-10-CM | POA: Diagnosis not present

## 2022-10-02 DIAGNOSIS — I130 Hypertensive heart and chronic kidney disease with heart failure and stage 1 through stage 4 chronic kidney disease, or unspecified chronic kidney disease: Secondary | ICD-10-CM | POA: Diagnosis not present

## 2022-10-02 DIAGNOSIS — D631 Anemia in chronic kidney disease: Secondary | ICD-10-CM | POA: Diagnosis not present

## 2022-10-02 DIAGNOSIS — E559 Vitamin D deficiency, unspecified: Secondary | ICD-10-CM | POA: Diagnosis not present

## 2022-10-02 DIAGNOSIS — N2889 Other specified disorders of kidney and ureter: Secondary | ICD-10-CM | POA: Diagnosis not present

## 2022-10-02 DIAGNOSIS — E11610 Type 2 diabetes mellitus with diabetic neuropathic arthropathy: Secondary | ICD-10-CM | POA: Diagnosis not present

## 2022-10-02 DIAGNOSIS — D509 Iron deficiency anemia, unspecified: Secondary | ICD-10-CM | POA: Diagnosis not present

## 2022-10-02 DIAGNOSIS — Z7982 Long term (current) use of aspirin: Secondary | ICD-10-CM | POA: Diagnosis not present

## 2022-10-02 DIAGNOSIS — I5032 Chronic diastolic (congestive) heart failure: Secondary | ICD-10-CM | POA: Diagnosis not present

## 2022-10-02 DIAGNOSIS — R918 Other nonspecific abnormal finding of lung field: Secondary | ICD-10-CM | POA: Diagnosis not present

## 2022-10-02 DIAGNOSIS — Z8616 Personal history of COVID-19: Secondary | ICD-10-CM | POA: Diagnosis not present

## 2022-10-02 DIAGNOSIS — Z6841 Body Mass Index (BMI) 40.0 and over, adult: Secondary | ICD-10-CM | POA: Diagnosis not present

## 2022-10-02 DIAGNOSIS — I420 Dilated cardiomyopathy: Secondary | ICD-10-CM | POA: Diagnosis not present

## 2022-10-02 DIAGNOSIS — Z9181 History of falling: Secondary | ICD-10-CM | POA: Diagnosis not present

## 2022-10-02 DIAGNOSIS — E1122 Type 2 diabetes mellitus with diabetic chronic kidney disease: Secondary | ICD-10-CM | POA: Diagnosis not present

## 2022-10-02 DIAGNOSIS — E538 Deficiency of other specified B group vitamins: Secondary | ICD-10-CM | POA: Diagnosis not present

## 2022-10-02 DIAGNOSIS — N393 Stress incontinence (female) (male): Secondary | ICD-10-CM | POA: Diagnosis not present

## 2022-10-02 DIAGNOSIS — E782 Mixed hyperlipidemia: Secondary | ICD-10-CM | POA: Diagnosis not present

## 2022-10-02 DIAGNOSIS — Z794 Long term (current) use of insulin: Secondary | ICD-10-CM | POA: Diagnosis not present

## 2022-10-02 DIAGNOSIS — N1832 Chronic kidney disease, stage 3b: Secondary | ICD-10-CM | POA: Diagnosis not present

## 2022-10-02 DIAGNOSIS — G4733 Obstructive sleep apnea (adult) (pediatric): Secondary | ICD-10-CM | POA: Diagnosis not present

## 2022-10-02 DIAGNOSIS — E113299 Type 2 diabetes mellitus with mild nonproliferative diabetic retinopathy without macular edema, unspecified eye: Secondary | ICD-10-CM | POA: Diagnosis not present

## 2022-10-11 DIAGNOSIS — D509 Iron deficiency anemia, unspecified: Secondary | ICD-10-CM | POA: Diagnosis not present

## 2022-10-11 DIAGNOSIS — E559 Vitamin D deficiency, unspecified: Secondary | ICD-10-CM | POA: Diagnosis not present

## 2022-10-11 DIAGNOSIS — E1169 Type 2 diabetes mellitus with other specified complication: Secondary | ICD-10-CM | POA: Diagnosis not present

## 2022-10-11 DIAGNOSIS — I10 Essential (primary) hypertension: Secondary | ICD-10-CM | POA: Diagnosis not present

## 2022-10-11 DIAGNOSIS — E782 Mixed hyperlipidemia: Secondary | ICD-10-CM | POA: Diagnosis not present

## 2022-10-15 DIAGNOSIS — E211 Secondary hyperparathyroidism, not elsewhere classified: Secondary | ICD-10-CM | POA: Diagnosis not present

## 2022-10-15 DIAGNOSIS — R808 Other proteinuria: Secondary | ICD-10-CM | POA: Diagnosis not present

## 2022-10-15 DIAGNOSIS — D638 Anemia in other chronic diseases classified elsewhere: Secondary | ICD-10-CM | POA: Diagnosis not present

## 2022-10-15 DIAGNOSIS — I129 Hypertensive chronic kidney disease with stage 1 through stage 4 chronic kidney disease, or unspecified chronic kidney disease: Secondary | ICD-10-CM | POA: Diagnosis not present

## 2022-10-15 DIAGNOSIS — I5023 Acute on chronic systolic (congestive) heart failure: Secondary | ICD-10-CM | POA: Diagnosis not present

## 2022-10-15 DIAGNOSIS — N189 Chronic kidney disease, unspecified: Secondary | ICD-10-CM | POA: Diagnosis not present

## 2022-10-15 DIAGNOSIS — N2889 Other specified disorders of kidney and ureter: Secondary | ICD-10-CM | POA: Diagnosis not present

## 2022-10-15 DIAGNOSIS — E1122 Type 2 diabetes mellitus with diabetic chronic kidney disease: Secondary | ICD-10-CM | POA: Diagnosis not present

## 2022-10-16 DIAGNOSIS — E1142 Type 2 diabetes mellitus with diabetic polyneuropathy: Secondary | ICD-10-CM | POA: Diagnosis not present

## 2022-10-16 DIAGNOSIS — L97402 Non-pressure chronic ulcer of unspecified heel and midfoot with fat layer exposed: Secondary | ICD-10-CM | POA: Diagnosis not present

## 2022-10-16 DIAGNOSIS — E11610 Type 2 diabetes mellitus with diabetic neuropathic arthropathy: Secondary | ICD-10-CM | POA: Diagnosis not present

## 2022-10-16 DIAGNOSIS — E11621 Type 2 diabetes mellitus with foot ulcer: Secondary | ICD-10-CM | POA: Diagnosis not present

## 2022-10-17 DIAGNOSIS — N2889 Other specified disorders of kidney and ureter: Secondary | ICD-10-CM | POA: Diagnosis not present

## 2022-10-17 DIAGNOSIS — I420 Dilated cardiomyopathy: Secondary | ICD-10-CM | POA: Diagnosis not present

## 2022-10-17 DIAGNOSIS — Z794 Long term (current) use of insulin: Secondary | ICD-10-CM | POA: Diagnosis not present

## 2022-10-17 DIAGNOSIS — D509 Iron deficiency anemia, unspecified: Secondary | ICD-10-CM | POA: Diagnosis not present

## 2022-10-17 DIAGNOSIS — Z9181 History of falling: Secondary | ICD-10-CM | POA: Diagnosis not present

## 2022-10-17 DIAGNOSIS — G4733 Obstructive sleep apnea (adult) (pediatric): Secondary | ICD-10-CM | POA: Diagnosis not present

## 2022-10-17 DIAGNOSIS — N1832 Chronic kidney disease, stage 3b: Secondary | ICD-10-CM | POA: Diagnosis not present

## 2022-10-17 DIAGNOSIS — R918 Other nonspecific abnormal finding of lung field: Secondary | ICD-10-CM | POA: Diagnosis not present

## 2022-10-17 DIAGNOSIS — Z7982 Long term (current) use of aspirin: Secondary | ICD-10-CM | POA: Diagnosis not present

## 2022-10-17 DIAGNOSIS — E11610 Type 2 diabetes mellitus with diabetic neuropathic arthropathy: Secondary | ICD-10-CM | POA: Diagnosis not present

## 2022-10-17 DIAGNOSIS — E782 Mixed hyperlipidemia: Secondary | ICD-10-CM | POA: Diagnosis not present

## 2022-10-17 DIAGNOSIS — I5032 Chronic diastolic (congestive) heart failure: Secondary | ICD-10-CM | POA: Diagnosis not present

## 2022-10-17 DIAGNOSIS — I130 Hypertensive heart and chronic kidney disease with heart failure and stage 1 through stage 4 chronic kidney disease, or unspecified chronic kidney disease: Secondary | ICD-10-CM | POA: Diagnosis not present

## 2022-10-17 DIAGNOSIS — E113299 Type 2 diabetes mellitus with mild nonproliferative diabetic retinopathy without macular edema, unspecified eye: Secondary | ICD-10-CM | POA: Diagnosis not present

## 2022-10-17 DIAGNOSIS — Z8616 Personal history of COVID-19: Secondary | ICD-10-CM | POA: Diagnosis not present

## 2022-10-17 DIAGNOSIS — E538 Deficiency of other specified B group vitamins: Secondary | ICD-10-CM | POA: Diagnosis not present

## 2022-10-17 DIAGNOSIS — D631 Anemia in chronic kidney disease: Secondary | ICD-10-CM | POA: Diagnosis not present

## 2022-10-17 DIAGNOSIS — E559 Vitamin D deficiency, unspecified: Secondary | ICD-10-CM | POA: Diagnosis not present

## 2022-10-17 DIAGNOSIS — E1122 Type 2 diabetes mellitus with diabetic chronic kidney disease: Secondary | ICD-10-CM | POA: Diagnosis not present

## 2022-10-17 DIAGNOSIS — Z6841 Body Mass Index (BMI) 40.0 and over, adult: Secondary | ICD-10-CM | POA: Diagnosis not present

## 2022-10-17 DIAGNOSIS — N393 Stress incontinence (female) (male): Secondary | ICD-10-CM | POA: Diagnosis not present

## 2022-11-02 DIAGNOSIS — I5032 Chronic diastolic (congestive) heart failure: Secondary | ICD-10-CM | POA: Diagnosis not present

## 2022-11-02 DIAGNOSIS — E782 Mixed hyperlipidemia: Secondary | ICD-10-CM | POA: Diagnosis not present

## 2022-11-02 DIAGNOSIS — R918 Other nonspecific abnormal finding of lung field: Secondary | ICD-10-CM | POA: Diagnosis not present

## 2022-11-02 DIAGNOSIS — N393 Stress incontinence (female) (male): Secondary | ICD-10-CM | POA: Diagnosis not present

## 2022-11-02 DIAGNOSIS — I130 Hypertensive heart and chronic kidney disease with heart failure and stage 1 through stage 4 chronic kidney disease, or unspecified chronic kidney disease: Secondary | ICD-10-CM | POA: Diagnosis not present

## 2022-11-02 DIAGNOSIS — I420 Dilated cardiomyopathy: Secondary | ICD-10-CM | POA: Diagnosis not present

## 2022-11-02 DIAGNOSIS — N2889 Other specified disorders of kidney and ureter: Secondary | ICD-10-CM | POA: Diagnosis not present

## 2022-11-02 DIAGNOSIS — Z794 Long term (current) use of insulin: Secondary | ICD-10-CM | POA: Diagnosis not present

## 2022-11-02 DIAGNOSIS — D631 Anemia in chronic kidney disease: Secondary | ICD-10-CM | POA: Diagnosis not present

## 2022-11-02 DIAGNOSIS — E1122 Type 2 diabetes mellitus with diabetic chronic kidney disease: Secondary | ICD-10-CM | POA: Diagnosis not present

## 2022-11-02 DIAGNOSIS — Z9181 History of falling: Secondary | ICD-10-CM | POA: Diagnosis not present

## 2022-11-02 DIAGNOSIS — E113299 Type 2 diabetes mellitus with mild nonproliferative diabetic retinopathy without macular edema, unspecified eye: Secondary | ICD-10-CM | POA: Diagnosis not present

## 2022-11-02 DIAGNOSIS — E538 Deficiency of other specified B group vitamins: Secondary | ICD-10-CM | POA: Diagnosis not present

## 2022-11-02 DIAGNOSIS — G4733 Obstructive sleep apnea (adult) (pediatric): Secondary | ICD-10-CM | POA: Diagnosis not present

## 2022-11-02 DIAGNOSIS — D509 Iron deficiency anemia, unspecified: Secondary | ICD-10-CM | POA: Diagnosis not present

## 2022-11-02 DIAGNOSIS — N1832 Chronic kidney disease, stage 3b: Secondary | ICD-10-CM | POA: Diagnosis not present

## 2022-11-02 DIAGNOSIS — Z7982 Long term (current) use of aspirin: Secondary | ICD-10-CM | POA: Diagnosis not present

## 2022-11-02 DIAGNOSIS — Z8616 Personal history of COVID-19: Secondary | ICD-10-CM | POA: Diagnosis not present

## 2022-11-02 DIAGNOSIS — Z6841 Body Mass Index (BMI) 40.0 and over, adult: Secondary | ICD-10-CM | POA: Diagnosis not present

## 2022-11-02 DIAGNOSIS — E11610 Type 2 diabetes mellitus with diabetic neuropathic arthropathy: Secondary | ICD-10-CM | POA: Diagnosis not present

## 2022-11-02 DIAGNOSIS — E559 Vitamin D deficiency, unspecified: Secondary | ICD-10-CM | POA: Diagnosis not present

## 2022-11-12 DIAGNOSIS — D509 Iron deficiency anemia, unspecified: Secondary | ICD-10-CM | POA: Diagnosis not present

## 2022-11-12 DIAGNOSIS — E113299 Type 2 diabetes mellitus with mild nonproliferative diabetic retinopathy without macular edema, unspecified eye: Secondary | ICD-10-CM | POA: Diagnosis not present

## 2022-11-12 DIAGNOSIS — E782 Mixed hyperlipidemia: Secondary | ICD-10-CM | POA: Diagnosis not present

## 2022-11-12 DIAGNOSIS — I5032 Chronic diastolic (congestive) heart failure: Secondary | ICD-10-CM | POA: Diagnosis not present

## 2022-11-12 DIAGNOSIS — N1832 Chronic kidney disease, stage 3b: Secondary | ICD-10-CM | POA: Diagnosis not present

## 2022-11-12 DIAGNOSIS — E11621 Type 2 diabetes mellitus with foot ulcer: Secondary | ICD-10-CM | POA: Diagnosis not present

## 2022-11-12 DIAGNOSIS — N060 Isolated proteinuria with minor glomerular abnormality: Secondary | ICD-10-CM | POA: Diagnosis not present

## 2022-11-12 DIAGNOSIS — M14679 Charcot's joint, unspecified ankle and foot: Secondary | ICD-10-CM | POA: Diagnosis not present

## 2022-11-12 DIAGNOSIS — I420 Dilated cardiomyopathy: Secondary | ICD-10-CM | POA: Diagnosis not present

## 2022-11-12 DIAGNOSIS — I130 Hypertensive heart and chronic kidney disease with heart failure and stage 1 through stage 4 chronic kidney disease, or unspecified chronic kidney disease: Secondary | ICD-10-CM | POA: Diagnosis not present

## 2022-11-12 DIAGNOSIS — E559 Vitamin D deficiency, unspecified: Secondary | ICD-10-CM | POA: Diagnosis not present

## 2022-11-19 DIAGNOSIS — D631 Anemia in chronic kidney disease: Secondary | ICD-10-CM | POA: Diagnosis not present

## 2022-11-19 DIAGNOSIS — I420 Dilated cardiomyopathy: Secondary | ICD-10-CM | POA: Diagnosis not present

## 2022-11-19 DIAGNOSIS — I5032 Chronic diastolic (congestive) heart failure: Secondary | ICD-10-CM | POA: Diagnosis not present

## 2022-11-19 DIAGNOSIS — Z7982 Long term (current) use of aspirin: Secondary | ICD-10-CM | POA: Diagnosis not present

## 2022-11-19 DIAGNOSIS — N393 Stress incontinence (female) (male): Secondary | ICD-10-CM | POA: Diagnosis not present

## 2022-11-19 DIAGNOSIS — I130 Hypertensive heart and chronic kidney disease with heart failure and stage 1 through stage 4 chronic kidney disease, or unspecified chronic kidney disease: Secondary | ICD-10-CM | POA: Diagnosis not present

## 2022-11-19 DIAGNOSIS — Z9181 History of falling: Secondary | ICD-10-CM | POA: Diagnosis not present

## 2022-11-19 DIAGNOSIS — E782 Mixed hyperlipidemia: Secondary | ICD-10-CM | POA: Diagnosis not present

## 2022-11-19 DIAGNOSIS — E538 Deficiency of other specified B group vitamins: Secondary | ICD-10-CM | POA: Diagnosis not present

## 2022-11-19 DIAGNOSIS — E1122 Type 2 diabetes mellitus with diabetic chronic kidney disease: Secondary | ICD-10-CM | POA: Diagnosis not present

## 2022-11-19 DIAGNOSIS — E113299 Type 2 diabetes mellitus with mild nonproliferative diabetic retinopathy without macular edema, unspecified eye: Secondary | ICD-10-CM | POA: Diagnosis not present

## 2022-11-19 DIAGNOSIS — Z794 Long term (current) use of insulin: Secondary | ICD-10-CM | POA: Diagnosis not present

## 2022-11-19 DIAGNOSIS — G4733 Obstructive sleep apnea (adult) (pediatric): Secondary | ICD-10-CM | POA: Diagnosis not present

## 2022-11-19 DIAGNOSIS — E11610 Type 2 diabetes mellitus with diabetic neuropathic arthropathy: Secondary | ICD-10-CM | POA: Diagnosis not present

## 2022-11-19 DIAGNOSIS — E559 Vitamin D deficiency, unspecified: Secondary | ICD-10-CM | POA: Diagnosis not present

## 2022-11-19 DIAGNOSIS — Z6841 Body Mass Index (BMI) 40.0 and over, adult: Secondary | ICD-10-CM | POA: Diagnosis not present

## 2022-11-19 DIAGNOSIS — R918 Other nonspecific abnormal finding of lung field: Secondary | ICD-10-CM | POA: Diagnosis not present

## 2022-11-19 DIAGNOSIS — N1832 Chronic kidney disease, stage 3b: Secondary | ICD-10-CM | POA: Diagnosis not present

## 2022-11-19 DIAGNOSIS — N2889 Other specified disorders of kidney and ureter: Secondary | ICD-10-CM | POA: Diagnosis not present

## 2022-11-19 DIAGNOSIS — Z8616 Personal history of COVID-19: Secondary | ICD-10-CM | POA: Diagnosis not present

## 2022-11-19 DIAGNOSIS — D509 Iron deficiency anemia, unspecified: Secondary | ICD-10-CM | POA: Diagnosis not present

## 2022-12-05 DIAGNOSIS — N1832 Chronic kidney disease, stage 3b: Secondary | ICD-10-CM | POA: Diagnosis not present

## 2022-12-10 DIAGNOSIS — N1832 Chronic kidney disease, stage 3b: Secondary | ICD-10-CM | POA: Diagnosis not present

## 2022-12-24 DIAGNOSIS — N1832 Chronic kidney disease, stage 3b: Secondary | ICD-10-CM | POA: Diagnosis not present

## 2022-12-28 DIAGNOSIS — E1122 Type 2 diabetes mellitus with diabetic chronic kidney disease: Secondary | ICD-10-CM | POA: Diagnosis not present

## 2022-12-28 DIAGNOSIS — I129 Hypertensive chronic kidney disease with stage 1 through stage 4 chronic kidney disease, or unspecified chronic kidney disease: Secondary | ICD-10-CM | POA: Diagnosis not present

## 2022-12-28 DIAGNOSIS — N2889 Other specified disorders of kidney and ureter: Secondary | ICD-10-CM | POA: Diagnosis not present

## 2022-12-28 DIAGNOSIS — N2581 Secondary hyperparathyroidism of renal origin: Secondary | ICD-10-CM | POA: Diagnosis not present

## 2022-12-28 DIAGNOSIS — I5022 Chronic systolic (congestive) heart failure: Secondary | ICD-10-CM | POA: Diagnosis not present

## 2022-12-28 DIAGNOSIS — D638 Anemia in other chronic diseases classified elsewhere: Secondary | ICD-10-CM | POA: Diagnosis not present

## 2022-12-28 DIAGNOSIS — N189 Chronic kidney disease, unspecified: Secondary | ICD-10-CM | POA: Diagnosis not present

## 2022-12-28 DIAGNOSIS — R808 Other proteinuria: Secondary | ICD-10-CM | POA: Diagnosis not present

## 2022-12-28 DIAGNOSIS — I5023 Acute on chronic systolic (congestive) heart failure: Secondary | ICD-10-CM | POA: Diagnosis not present

## 2022-12-31 DIAGNOSIS — N1832 Chronic kidney disease, stage 3b: Secondary | ICD-10-CM | POA: Diagnosis not present

## 2023-01-02 DIAGNOSIS — N1832 Chronic kidney disease, stage 3b: Secondary | ICD-10-CM | POA: Diagnosis not present

## 2023-01-07 DIAGNOSIS — N1832 Chronic kidney disease, stage 3b: Secondary | ICD-10-CM | POA: Diagnosis not present

## 2023-01-14 DIAGNOSIS — N1832 Chronic kidney disease, stage 3b: Secondary | ICD-10-CM | POA: Diagnosis not present

## 2023-01-16 DIAGNOSIS — N1832 Chronic kidney disease, stage 3b: Secondary | ICD-10-CM | POA: Diagnosis not present

## 2023-01-21 DIAGNOSIS — N1832 Chronic kidney disease, stage 3b: Secondary | ICD-10-CM | POA: Diagnosis not present

## 2023-01-28 DIAGNOSIS — N1832 Chronic kidney disease, stage 3b: Secondary | ICD-10-CM | POA: Diagnosis not present

## 2023-02-20 DIAGNOSIS — I10 Essential (primary) hypertension: Secondary | ICD-10-CM | POA: Diagnosis not present

## 2023-02-20 DIAGNOSIS — E782 Mixed hyperlipidemia: Secondary | ICD-10-CM | POA: Diagnosis not present

## 2023-02-20 DIAGNOSIS — E559 Vitamin D deficiency, unspecified: Secondary | ICD-10-CM | POA: Diagnosis not present

## 2023-02-20 DIAGNOSIS — E1169 Type 2 diabetes mellitus with other specified complication: Secondary | ICD-10-CM | POA: Diagnosis not present

## 2023-02-20 DIAGNOSIS — D509 Iron deficiency anemia, unspecified: Secondary | ICD-10-CM | POA: Diagnosis not present

## 2023-03-05 DIAGNOSIS — I5022 Chronic systolic (congestive) heart failure: Secondary | ICD-10-CM | POA: Diagnosis not present

## 2023-03-05 DIAGNOSIS — N179 Acute kidney failure, unspecified: Secondary | ICD-10-CM | POA: Diagnosis not present

## 2023-03-05 DIAGNOSIS — N2581 Secondary hyperparathyroidism of renal origin: Secondary | ICD-10-CM | POA: Diagnosis not present

## 2023-03-05 DIAGNOSIS — N2889 Other specified disorders of kidney and ureter: Secondary | ICD-10-CM | POA: Diagnosis not present

## 2023-03-20 DIAGNOSIS — I110 Hypertensive heart disease with heart failure: Secondary | ICD-10-CM | POA: Diagnosis not present

## 2023-03-20 DIAGNOSIS — E1122 Type 2 diabetes mellitus with diabetic chronic kidney disease: Secondary | ICD-10-CM | POA: Diagnosis not present

## 2023-03-20 DIAGNOSIS — M14679 Charcot's joint, unspecified ankle and foot: Secondary | ICD-10-CM | POA: Diagnosis not present

## 2023-03-20 DIAGNOSIS — I420 Dilated cardiomyopathy: Secondary | ICD-10-CM | POA: Diagnosis not present

## 2023-03-20 DIAGNOSIS — I130 Hypertensive heart and chronic kidney disease with heart failure and stage 1 through stage 4 chronic kidney disease, or unspecified chronic kidney disease: Secondary | ICD-10-CM | POA: Diagnosis not present

## 2023-03-20 DIAGNOSIS — I5032 Chronic diastolic (congestive) heart failure: Secondary | ICD-10-CM | POA: Diagnosis not present

## 2023-03-20 DIAGNOSIS — E11621 Type 2 diabetes mellitus with foot ulcer: Secondary | ICD-10-CM | POA: Diagnosis not present

## 2023-03-20 DIAGNOSIS — R918 Other nonspecific abnormal finding of lung field: Secondary | ICD-10-CM | POA: Diagnosis not present

## 2023-03-20 DIAGNOSIS — E113299 Type 2 diabetes mellitus with mild nonproliferative diabetic retinopathy without macular edema, unspecified eye: Secondary | ICD-10-CM | POA: Diagnosis not present

## 2023-03-20 DIAGNOSIS — E559 Vitamin D deficiency, unspecified: Secondary | ICD-10-CM | POA: Diagnosis not present

## 2023-03-20 DIAGNOSIS — N1832 Chronic kidney disease, stage 3b: Secondary | ICD-10-CM | POA: Diagnosis not present

## 2023-10-16 ENCOUNTER — Emergency Department (HOSPITAL_COMMUNITY)

## 2023-10-16 ENCOUNTER — Other Ambulatory Visit: Payer: Self-pay

## 2023-10-16 ENCOUNTER — Inpatient Hospital Stay (HOSPITAL_COMMUNITY)
Admission: EM | Admit: 2023-10-16 | Discharge: 2023-10-18 | DRG: 872 | Disposition: A | Discharge status: Other Acute Inpatient Hospital | Attending: Family Medicine | Admitting: Family Medicine

## 2023-10-16 ENCOUNTER — Encounter (HOSPITAL_COMMUNITY): Payer: Self-pay

## 2023-10-16 ENCOUNTER — Encounter (HOSPITAL_COMMUNITY): Payer: Self-pay | Admitting: Hematology

## 2023-10-16 DIAGNOSIS — M21961 Unspecified acquired deformity of right lower leg: Secondary | ICD-10-CM | POA: Diagnosis present

## 2023-10-16 DIAGNOSIS — R7881 Bacteremia: Secondary | ICD-10-CM | POA: Insufficient documentation

## 2023-10-16 DIAGNOSIS — D75839 Thrombocytosis, unspecified: Secondary | ICD-10-CM | POA: Diagnosis not present

## 2023-10-16 DIAGNOSIS — Z7401 Bed confinement status: Secondary | ICD-10-CM | POA: Diagnosis not present

## 2023-10-16 DIAGNOSIS — B964 Proteus (mirabilis) (morganii) as the cause of diseases classified elsewhere: Secondary | ICD-10-CM | POA: Diagnosis present

## 2023-10-16 DIAGNOSIS — I10 Essential (primary) hypertension: Secondary | ICD-10-CM

## 2023-10-16 DIAGNOSIS — Z79899 Other long term (current) drug therapy: Secondary | ICD-10-CM

## 2023-10-16 DIAGNOSIS — L03115 Cellulitis of right lower limb: Secondary | ICD-10-CM | POA: Diagnosis present

## 2023-10-16 DIAGNOSIS — D72829 Elevated white blood cell count, unspecified: Secondary | ICD-10-CM

## 2023-10-16 DIAGNOSIS — E1122 Type 2 diabetes mellitus with diabetic chronic kidney disease: Secondary | ICD-10-CM | POA: Diagnosis present

## 2023-10-16 DIAGNOSIS — D649 Anemia, unspecified: Secondary | ICD-10-CM | POA: Diagnosis present

## 2023-10-16 DIAGNOSIS — N2889 Other specified disorders of kidney and ureter: Secondary | ICD-10-CM | POA: Diagnosis not present

## 2023-10-16 DIAGNOSIS — Z8249 Family history of ischemic heart disease and other diseases of the circulatory system: Secondary | ICD-10-CM

## 2023-10-16 DIAGNOSIS — Z604 Social exclusion and rejection: Secondary | ICD-10-CM | POA: Diagnosis present

## 2023-10-16 DIAGNOSIS — Z888 Allergy status to other drugs, medicaments and biological substances status: Secondary | ICD-10-CM

## 2023-10-16 DIAGNOSIS — Z87442 Personal history of urinary calculi: Secondary | ICD-10-CM

## 2023-10-16 DIAGNOSIS — E11621 Type 2 diabetes mellitus with foot ulcer: Secondary | ICD-10-CM | POA: Diagnosis present

## 2023-10-16 DIAGNOSIS — Z87440 Personal history of urinary (tract) infections: Secondary | ICD-10-CM

## 2023-10-16 DIAGNOSIS — I5032 Chronic diastolic (congestive) heart failure: Secondary | ICD-10-CM

## 2023-10-16 DIAGNOSIS — E782 Mixed hyperlipidemia: Secondary | ICD-10-CM | POA: Diagnosis present

## 2023-10-16 DIAGNOSIS — I272 Pulmonary hypertension, unspecified: Secondary | ICD-10-CM | POA: Diagnosis not present

## 2023-10-16 DIAGNOSIS — J9621 Acute and chronic respiratory failure with hypoxia: Secondary | ICD-10-CM | POA: Diagnosis not present

## 2023-10-16 DIAGNOSIS — M14671 Charcot's joint, right ankle and foot: Secondary | ICD-10-CM | POA: Diagnosis not present

## 2023-10-16 DIAGNOSIS — L0390 Cellulitis, unspecified: Secondary | ICD-10-CM

## 2023-10-16 DIAGNOSIS — R52 Pain, unspecified: Secondary | ICD-10-CM | POA: Diagnosis not present

## 2023-10-16 DIAGNOSIS — L89151 Pressure ulcer of sacral region, stage 1: Secondary | ICD-10-CM | POA: Diagnosis present

## 2023-10-16 DIAGNOSIS — G4733 Obstructive sleep apnea (adult) (pediatric): Secondary | ICD-10-CM | POA: Diagnosis present

## 2023-10-16 DIAGNOSIS — Z885 Allergy status to narcotic agent status: Secondary | ICD-10-CM

## 2023-10-16 DIAGNOSIS — E1169 Type 2 diabetes mellitus with other specified complication: Secondary | ICD-10-CM | POA: Diagnosis present

## 2023-10-16 DIAGNOSIS — L8990 Pressure ulcer of unspecified site, unspecified stage: Secondary | ICD-10-CM | POA: Insufficient documentation

## 2023-10-16 DIAGNOSIS — N179 Acute kidney failure, unspecified: Secondary | ICD-10-CM | POA: Diagnosis not present

## 2023-10-16 DIAGNOSIS — Z794 Long term (current) use of insulin: Secondary | ICD-10-CM

## 2023-10-16 DIAGNOSIS — Z515 Encounter for palliative care: Secondary | ICD-10-CM | POA: Diagnosis not present

## 2023-10-16 DIAGNOSIS — L97419 Non-pressure chronic ulcer of right heel and midfoot with unspecified severity: Secondary | ICD-10-CM | POA: Diagnosis present

## 2023-10-16 DIAGNOSIS — M869 Osteomyelitis, unspecified: Secondary | ICD-10-CM | POA: Diagnosis not present

## 2023-10-16 DIAGNOSIS — I130 Hypertensive heart and chronic kidney disease with heart failure and stage 1 through stage 4 chronic kidney disease, or unspecified chronic kidney disease: Secondary | ICD-10-CM | POA: Diagnosis present

## 2023-10-16 DIAGNOSIS — E669 Obesity, unspecified: Secondary | ICD-10-CM | POA: Diagnosis not present

## 2023-10-16 DIAGNOSIS — Z7189 Other specified counseling: Secondary | ICD-10-CM | POA: Diagnosis not present

## 2023-10-16 DIAGNOSIS — N184 Chronic kidney disease, stage 4 (severe): Secondary | ICD-10-CM | POA: Diagnosis not present

## 2023-10-16 DIAGNOSIS — Z7982 Long term (current) use of aspirin: Secondary | ICD-10-CM

## 2023-10-16 DIAGNOSIS — E11610 Type 2 diabetes mellitus with diabetic neuropathic arthropathy: Secondary | ICD-10-CM | POA: Diagnosis present

## 2023-10-16 DIAGNOSIS — E11628 Type 2 diabetes mellitus with other skin complications: Secondary | ICD-10-CM | POA: Diagnosis not present

## 2023-10-16 DIAGNOSIS — R059 Cough, unspecified: Secondary | ICD-10-CM | POA: Diagnosis present

## 2023-10-16 DIAGNOSIS — R601 Generalized edema: Secondary | ICD-10-CM | POA: Diagnosis not present

## 2023-10-16 DIAGNOSIS — J9611 Chronic respiratory failure with hypoxia: Secondary | ICD-10-CM | POA: Diagnosis not present

## 2023-10-16 DIAGNOSIS — A419 Sepsis, unspecified organism: Principal | ICD-10-CM | POA: Diagnosis present

## 2023-10-16 DIAGNOSIS — R0600 Dyspnea, unspecified: Secondary | ICD-10-CM | POA: Diagnosis not present

## 2023-10-16 DIAGNOSIS — A4159 Other Gram-negative sepsis: Principal | ICD-10-CM | POA: Diagnosis present

## 2023-10-16 DIAGNOSIS — L089 Local infection of the skin and subcutaneous tissue, unspecified: Secondary | ICD-10-CM | POA: Diagnosis not present

## 2023-10-16 DIAGNOSIS — E871 Hypo-osmolality and hyponatremia: Secondary | ICD-10-CM | POA: Diagnosis not present

## 2023-10-16 DIAGNOSIS — F419 Anxiety disorder, unspecified: Secondary | ICD-10-CM | POA: Diagnosis not present

## 2023-10-16 DIAGNOSIS — L02611 Cutaneous abscess of right foot: Secondary | ICD-10-CM | POA: Diagnosis present

## 2023-10-16 DIAGNOSIS — E119 Type 2 diabetes mellitus without complications: Secondary | ICD-10-CM | POA: Diagnosis not present

## 2023-10-16 DIAGNOSIS — Z1152 Encounter for screening for COVID-19: Secondary | ICD-10-CM

## 2023-10-16 DIAGNOSIS — E1142 Type 2 diabetes mellitus with diabetic polyneuropathy: Secondary | ICD-10-CM | POA: Diagnosis not present

## 2023-10-16 DIAGNOSIS — E66813 Obesity, class 3: Secondary | ICD-10-CM | POA: Diagnosis present

## 2023-10-16 DIAGNOSIS — Z96659 Presence of unspecified artificial knee joint: Secondary | ICD-10-CM | POA: Diagnosis present

## 2023-10-16 DIAGNOSIS — R309 Painful micturition, unspecified: Secondary | ICD-10-CM | POA: Diagnosis present

## 2023-10-16 DIAGNOSIS — Z66 Do not resuscitate: Secondary | ICD-10-CM | POA: Diagnosis not present

## 2023-10-16 DIAGNOSIS — Z6841 Body Mass Index (BMI) 40.0 and over, adult: Secondary | ICD-10-CM | POA: Diagnosis not present

## 2023-10-16 DIAGNOSIS — E860 Dehydration: Secondary | ICD-10-CM | POA: Diagnosis present

## 2023-10-16 DIAGNOSIS — L97519 Non-pressure chronic ulcer of other part of right foot with unspecified severity: Secondary | ICD-10-CM | POA: Diagnosis not present

## 2023-10-16 DIAGNOSIS — M86171 Other acute osteomyelitis, right ankle and foot: Secondary | ICD-10-CM | POA: Diagnosis not present

## 2023-10-16 DIAGNOSIS — I5033 Acute on chronic diastolic (congestive) heart failure: Secondary | ICD-10-CM | POA: Diagnosis not present

## 2023-10-16 DIAGNOSIS — R040 Epistaxis: Secondary | ICD-10-CM | POA: Diagnosis not present

## 2023-10-16 DIAGNOSIS — I4891 Unspecified atrial fibrillation: Secondary | ICD-10-CM | POA: Diagnosis present

## 2023-10-16 DIAGNOSIS — I509 Heart failure, unspecified: Secondary | ICD-10-CM | POA: Diagnosis not present

## 2023-10-16 DIAGNOSIS — Z87892 Personal history of anaphylaxis: Secondary | ICD-10-CM

## 2023-10-16 DIAGNOSIS — E1165 Type 2 diabetes mellitus with hyperglycemia: Secondary | ICD-10-CM

## 2023-10-16 DIAGNOSIS — M8580 Other specified disorders of bone density and structure, unspecified site: Secondary | ICD-10-CM | POA: Diagnosis present

## 2023-10-16 DIAGNOSIS — S91301A Unspecified open wound, right foot, initial encounter: Secondary | ICD-10-CM

## 2023-10-16 DIAGNOSIS — Z833 Family history of diabetes mellitus: Secondary | ICD-10-CM

## 2023-10-16 DIAGNOSIS — R110 Nausea: Secondary | ICD-10-CM | POA: Diagnosis not present

## 2023-10-16 LAB — CBC WITH DIFFERENTIAL/PLATELET
Abs Immature Granulocytes: 0.23 10*3/uL — ABNORMAL HIGH (ref 0.00–0.07)
Basophils Absolute: 0 10*3/uL (ref 0.0–0.1)
Basophils Relative: 0 %
Eosinophils Absolute: 0 10*3/uL (ref 0.0–0.5)
Eosinophils Relative: 0 %
HCT: 30 % — ABNORMAL LOW (ref 36.0–46.0)
Hemoglobin: 9.3 g/dL — ABNORMAL LOW (ref 12.0–15.0)
Immature Granulocytes: 2 %
Lymphocytes Relative: 4 %
Lymphs Abs: 0.5 10*3/uL — ABNORMAL LOW (ref 0.7–4.0)
MCH: 25.5 pg — ABNORMAL LOW (ref 26.0–34.0)
MCHC: 31 g/dL (ref 30.0–36.0)
MCV: 82.2 fL (ref 80.0–100.0)
Monocytes Absolute: 0.3 10*3/uL (ref 0.1–1.0)
Monocytes Relative: 3 %
Neutro Abs: 11.3 10*3/uL — ABNORMAL HIGH (ref 1.7–7.7)
Neutrophils Relative %: 91 %
Platelets: 420 10*3/uL — ABNORMAL HIGH (ref 150–400)
RBC: 3.65 MIL/uL — ABNORMAL LOW (ref 3.87–5.11)
RDW: 14.6 % (ref 11.5–15.5)
WBC: 12.4 10*3/uL — ABNORMAL HIGH (ref 4.0–10.5)
nRBC: 0 % (ref 0.0–0.2)

## 2023-10-16 LAB — BASIC METABOLIC PANEL WITH GFR
Anion gap: 15 (ref 5–15)
BUN: 74 mg/dL — ABNORMAL HIGH (ref 8–23)
CO2: 27 mmol/L (ref 22–32)
Calcium: 8.5 mg/dL — ABNORMAL LOW (ref 8.9–10.3)
Chloride: 88 mmol/L — ABNORMAL LOW (ref 98–111)
Creatinine, Ser: 2.27 mg/dL — ABNORMAL HIGH (ref 0.44–1.00)
GFR, Estimated: 23 mL/min — ABNORMAL LOW (ref >60)
Glucose, Bld: 145 mg/dL — ABNORMAL HIGH (ref 70–99)
Potassium: 4.4 mmol/L (ref 3.5–5.1)
Sodium: 130 mmol/L — ABNORMAL LOW (ref 135–145)

## 2023-10-16 LAB — RESP PANEL BY RT-PCR (RSV, FLU A&B, COVID)  RVPGX2
Influenza A by PCR: NEGATIVE
Influenza B by PCR: NEGATIVE
Resp Syncytial Virus by PCR: NEGATIVE
SARS Coronavirus 2 by RT PCR: NEGATIVE

## 2023-10-16 LAB — LACTIC ACID, PLASMA: Lactic Acid, Venous: 1.8 mmol/L (ref 0.5–1.9)

## 2023-10-16 MED ORDER — ACETAMINOPHEN 650 MG RE SUPP: 650.0000 mg | Freq: Four times a day (QID) | RECTAL | Status: DC | PRN

## 2023-10-16 MED ORDER — INSULIN ASPART 100 UNIT/ML IJ SOLN
0.0000 [IU] | Freq: Three times a day (TID) | INTRAMUSCULAR | Status: DC
  Administered 2023-10-17 – 2023-10-18 (×4): 2 [IU] via SUBCUTANEOUS
  Administered 2023-10-18 (×2): 3 [IU] via SUBCUTANEOUS

## 2023-10-16 MED ORDER — HYDROCOD POLI-CHLORPHE POLI ER 10-8 MG/5ML PO SUER
5.0000 mL | Freq: Two times a day (BID) | ORAL | Status: DC | PRN
  Administered 2023-10-16: 5 mL via ORAL
  Filled 2023-10-16: qty 5

## 2023-10-16 MED ORDER — ACETAMINOPHEN 500 MG PO TABS
1000.0000 mg | ORAL_TABLET | Freq: Once | ORAL | Status: AC
  Administered 2023-10-16: 1000 mg via ORAL
  Filled 2023-10-16: qty 2

## 2023-10-16 MED ORDER — SODIUM CHLORIDE 0.9 % IV SOLN
2.0000 g | INTRAVENOUS | Status: DC
  Administered 2023-10-17 – 2023-10-18 (×2): 2 g via INTRAVENOUS
  Filled 2023-10-16 (×2): qty 20

## 2023-10-16 MED ORDER — ACETAMINOPHEN 325 MG PO TABS
650.0000 mg | ORAL_TABLET | Freq: Four times a day (QID) | ORAL | Status: DC | PRN
  Administered 2023-10-17: 650 mg via ORAL
  Filled 2023-10-16: qty 2

## 2023-10-16 MED ORDER — ENOXAPARIN SODIUM 80 MG/0.8ML IJ SOSY
65.0000 mg | PREFILLED_SYRINGE | INTRAMUSCULAR | Status: DC
  Administered 2023-10-16: 65 mg via SUBCUTANEOUS
  Filled 2023-10-16: qty 0.8

## 2023-10-16 MED ORDER — SODIUM CHLORIDE 0.9 % IV SOLN
2.0000 g | Freq: Once | INTRAVENOUS | Status: AC
  Administered 2023-10-16: 2 g via INTRAVENOUS
  Filled 2023-10-16: qty 12.5

## 2023-10-16 MED ORDER — LINEZOLID 600 MG/300ML IV SOLN
600.0000 mg | Freq: Two times a day (BID) | INTRAVENOUS | Status: DC
  Filled 2023-10-16 (×2): qty 300

## 2023-10-16 MED ORDER — LINEZOLID 600 MG/300ML IV SOLN
600.0000 mg | Freq: Two times a day (BID) | INTRAVENOUS | Status: DC
  Administered 2023-10-17 – 2023-10-18 (×3): 600 mg via INTRAVENOUS
  Filled 2023-10-16 (×6): qty 300

## 2023-10-16 MED ORDER — PROCHLORPERAZINE EDISYLATE 10 MG/2ML IJ SOLN
10.0000 mg | Freq: Four times a day (QID) | INTRAMUSCULAR | Status: DC | PRN
  Administered 2023-10-18: 10 mg via INTRAVENOUS
  Filled 2023-10-16: qty 2

## 2023-10-16 MED ORDER — VANCOMYCIN HCL IN DEXTROSE 1-5 GM/200ML-% IV SOLN
1000.0000 mg | Freq: Once | INTRAVENOUS | Status: AC
  Administered 2023-10-16: 1000 mg via INTRAVENOUS
  Filled 2023-10-16: qty 200

## 2023-10-16 MED ORDER — LACTATED RINGERS IV SOLN: INTRAVENOUS | Status: DC

## 2023-10-16 MED ORDER — LACTATED RINGERS IV SOLN
INTRAVENOUS | Status: AC
Start: 2023-10-16 — End: 2023-10-17

## 2023-10-16 NOTE — H&P (Signed)
 History and Physical    Patient: Suzanne Tran ZOX:[id number] DOB: 1955/08/29 DOA: 10/16/2023 DOS: the patient was seen and examined on 10/16/2023 PCP: Benita Stabile, MD  Patient coming from: Home  Chief Complaint:  Chief Complaint  Patient presents with   Cough   HPI: Suzanne Tran is a 68 y.o. female with medical history significant of T2DM, hypertension, CKD stage 3, hypertension, right Charcot's foot, OSA, CHF who presents to the emergency department due to several weeks of bilateral leg swelling which has progressively worsened.  She has a wound on the plantar surface of right foot which home health has been dressing.  Last night, she developed fever of 101F and noted some redness around the right foot wound with extension to right lower leg.  Home health nurse came out to the and noted that the wound was no longer draining, but was still advised to go to the ED due to the redness of the leg and the fever. Patient also complained of 1 day of burning sensation on urination, obesity called in Cipro, but she just filled the prescription today and has not taken any of the medication.  She endorsed recent UTI in March as well as yeast infection which was treated with antibiotics at that time.  ED Course:  In the emergency department, she was febrile with a temperature of 102.44F, respiratory rate was 24 bpm, BP 145/70, pulse 85 bpm, O2 sat was 98% on room air.  Workup in the ED showed leukocytosis, normocytic anemia, thrombocytosis, lactic acid was normal.  Influenza A, B, SARS coronavirus 2, RSV was normal.  Blood culture was pending. Right foot x-ray showed no acute bony abnormality.  Advanced degenerative changes.  Osteopenia Chest x-ray showed cardiomegaly with central pulmonary vascular congestion. Patient was treated with Tylenol, IV cefepime and vancomycin was started and IV hydration was provided.  Hospitalist was asked to admit patient for further evaluation of anemia.  Review of  Systems: Review of systems as noted in the HPI. All other systems reviewed and are negative.   Past Medical History:  Diagnosis Date   Charcot ankle, right    CHF (congestive heart failure) (HCC)    CKD (chronic kidney disease) stage 3, GFR 30-59 ml/min (HCC)    Diabetic Charcot foot (HCC)    Essential hypertension    Mixed hyperlipidemia    Nephrolithiasis    Neuropathy    OSA (obstructive sleep apnea)    Type 2 diabetes mellitus (HCC)    Past Surgical History:  Procedure Laterality Date   ANKLE SURGERY     KNEE SURGERY     REPLACEMENT TOTAL KNEE     TONSILLECTOMY      Social History:  reports that she has never smoked. She has never used smokeless tobacco. She reports that she does not drink alcohol and does not use drugs.   Allergies  Allergen Reactions   Codeine     Rash and oral swelling    Coumadin [Warfarin] Anaphylaxis, Swelling and Rash   Dilantin [Phenytoin] Other (See Comments)    seizure     Family History  Problem Relation Age of Onset   Diabetes Mellitus II Mother    Cancer Mother    Hypertension Father    CAD Father    Diabetes Mellitus II Father    Diabetes Sister    Diabetes Brother      Prior to Admission medications   Medication Sig Start Date End Date Taking? Authorizing Provider  ascorbic acid (VITAMIN  C) 500 MG tablet Vitamin C 500 mg tablet  Take 1 tablet by oral route.   Yes [provider]  aspirin EC 81 MG tablet Take 1 tablet (81 mg total) by mouth daily. Swallow whole. 01/07/20  Yes Strader, [place] M, PA-C  B Complex Vitamins (VITAMIN B-COMPLEX) TABS Take by mouth.   Yes [provider]  benzonatate (TESSALON) 100 MG capsule Take 100 mg by mouth 3 (three) times daily as needed for cough.   Yes [provider]  Carboxymethylcellulose Sodium (THERATEARS EXTRA) 0.25 % SOLN Apply 1-2 drops to eye daily as needed (dry eyes).   Yes [provider]  carvedilol (COREG) 3.125 MG tablet Take 1 tablet  (3.125 mg total) by mouth 2 (two) times daily with a meal. 03/31/20  Yes Dahal, Melina Schools, MD  fluconazole (DIFLUCAN) 150 MG tablet Take 150 mg by mouth once. 09/25/23  Yes [provider]  insulin lispro (HUMALOG) 100 UNIT/ML KwikPen Inject 5-10 Units into the skin 3 (three) times daily. Sliding scale 11/27/20  Yes [provider]  ipratropium-albuterol (DUONEB) 0.5-2.5 (3) MG/3ML SOLN Take 3 mLs by nebulization every 6 (six) hours as needed (wheezing, shortness of breath).   Yes [provider]  LANTUS SOLOSTAR 100 UNIT/ML Solostar Pen Inject 35 Units into the skin daily.   Yes [provider]  Multiple Vitamin (MULTIVITAMIN) tablet Take 1 tablet by mouth daily.   Yes [provider]  Probiotic Product (MISC INTESTINAL FLORA REGULAT) CAPS Take 1 capsule by mouth daily.   Yes [provider]  spironolactone (ALDACTONE) 25 MG tablet Take 25 mg by mouth daily.   Yes [provider]  torsemide (DEMADEX) 100 MG tablet Take 50-100 mg by mouth 2 (two) times daily. 100 mg am 50 mg pm 10/15/22  Yes [provider]  Vitamin D, Ergocalciferol, (DRISDOL) 1.25 MG (50000 UNIT) CAPS capsule Take 50,000 Units by mouth once a week. 11/09/20  Yes [provider]  zinc gluconate 50 MG tablet Take 50 mg by mouth daily.   Yes [provider]  Continuous Blood Gluc Sensor (FREESTYLE LIBRE 2 SENSOR) MISC Apply topically. 10/21/20   [provider]    Physical Exam: BP (!) 159/72 (BP Location: Right Wrist)   Pulse 88   Temp 100 F (37.8 C) (Oral)   Resp (!) 22   Ht 5\' 10"  (1.778 m)   Wt 134.7 kg   SpO2 95%   BMI 42.61 kg/m   General: 68 y.o. year-old female well developed well nourished in no acute distress.  Alert and oriented x3. HEENT: NCAT, EOMI, dry mucous membrane Neck: Supple, trachea medial Cardiovascular: Regular rate and rhythm with no rubs or gallops.  No thyromegaly or JVD noted.  Bilateral ower extremity  edema. 2/4 pulses in all 4 extremities. Respiratory: Clear to auscultation with no wheezes or rales. Good inspiratory effort. Abdomen: Soft, nontender nondistended with normal bowel sounds x4 quadrants. Muskuloskeletal: No cyanosis, clubbing or edema noted bilaterally Neuro: CN II-XII intact, strength 5/5 x 4, sensation, reflexes intact Skin: Right lower leg with tenderness, warmth and redness.  Noted ulceration on the plantar aspect of right foot with no drainage, but with surrounding erythema.   Psychiatry: Mood is appropriate for condition and setting               Labs on Admission:  Basic Metabolic Panel: Recent Labs  Lab 10/16/23 1638  NA 130*  K 4.4  CL 88*  CO2 27  GLUCOSE 145*  BUN 74*  CREATININE 2.27*  CALCIUM 8.5*   Liver Function Tests: No results for input(s): "AST", "ALT", "ALKPHOS", "BILITOT", "PROT", "ALBUMIN" in the last 168 hours. No results for input(s): "LIPASE", "AMYLASE" in the last 168 hours. No results for input(s): "AMMONIA" in the last 168 hours. CBC: Recent Labs  Lab 10/16/23 1638  WBC 12.4*  NEUTROABS 11.3*  HGB 9.3*  HCT 30.0*  MCV 82.2  PLT 420*   Cardiac Enzymes: No results for input(s): "CKTOTAL", "CKMB", "CKMBINDEX", "TROPONINI" in the last 168 hours.  BNP (last 3 results) No results for input(s): "BNP" in the last 8760 hours.  ProBNP (last 3 results) No results for input(s): "PROBNP" in the last 8760 hours.  CBG: No results for input(s): "GLUCAP" in the last 168 hours.  Radiological Exams on Admission: DG Foot Complete Right Result Date: 10/16/2023 CLINICAL DATA:  Right foot ulcer, infection EXAM: RIGHT FOOT COMPLETE - 3+ VIEW COMPARISON:  None Available. FINDINGS: Advanced degenerative changes in the midfoot. Fusion across the tarsal bones and tarsal metatarsal joints. Diffuse osteopenia. Pes planus deformity. No acute fracture, subluxation or dislocation. No bone destruction to suggest osteomyelitis. Soft tissue ulcer noted  along the plantar surface. No soft tissue gas or radiopaque foreign body. Diffuse soft tissue swelling. IMPRESSION: No acute bony abnormality. Advanced degenerative changes. Osteopenia. Electronically Signed   By: Charlett Nose M.D.   On: 10/16/2023 19:31   DG Chest 2 View Result Date: 10/16/2023 CLINICAL DATA:  Shortness of breath EXAM: CHEST - 2 VIEW COMPARISON:  Chest x-ray 06/18/2022 FINDINGS: The heart is enlarged. There central pulmonary vascular congestion. There is no focal lung infiltrate, pleural effusion or pneumothorax. There are 2 nodular densities in the left suprahilar region. One appears vascular in the other is indeterminate measuring up to 16 mm. No acute fracture identified. IMPRESSION: 1. Cardiomegaly with central pulmonary vascular congestion. 2. There are 2 nodular densities in the left suprahilar region. One appears vascular in the other is indeterminate measuring up to 16 mm. Recommend further evaluation with CT. Electronically Signed   By: Darliss Cheney M.D.   On: 10/16/2023 18:17    EKG: I independently viewed the EKG done and my findings are as followed: Atrial fibrillation with rate control with LAFB and QTc of 554 ms  Assessment/Plan Present on Admission:  Sepsis due to cellulitis (HCC)  Chronic diastolic heart failure (HCC)  Essential hypertension  Principal Problem:   Sepsis due to cellulitis [facility]) Active Problems:   Essential hypertension   Chronic diastolic heart failure (HCC)   Open wound of plantar aspect of right foot   Pressure injury of skin   Leukocytosis   Thrombocytosis   Acute kidney injury superimposed on stage 4 chronic kidney disease (HCC)   Type 2 diabetes mellitus with hyperglycemia (HCC)   Obesity, Class III, BMI 40-49.9 (morbid obesity) (HCC)   Hyponatremia  Sepsis due to cellulitis Patient met sepsis criteria due to being febrile, tachypneic and having leukocytosis and source of infection being the skin (cellulitis) Patient was started on  IV vancomycin and cefepime, we shall continue with Zyvox and ceftriaxone per pharmacist's recommendation   Right plantar wound Continue wound care Continue antibiotics as indicated above  Leukocytosis possibly due to cellulitis vs reactive Continue management as described for cellulitis  Thrombocytosis possibly reactive Platelets 420, continue to monitor platelet levels  Acute kidney injury on CKD stage 4 Dehydration BUN/creatinine 74/2.27 (baseline creatinine at 1.91) Continue gentle hydration Renally adjust medications, avoid nephrotoxic agents/dehydration/hypotension  Hyponatremia possibly  due to dehydration Na 130, this is possibly due to diuretic use Continue gentle hydration and monitor sodium levels Urine osmolality, serum osmolality and urine sodium will be checked  Type 2 diabetes mellitus with hyperglycemia Continue ISS and hypoglycemia protocol  Chronic diastolic congestive heart failure Chest x-ray showed cardiomegaly with central pulmonary vascular congestion. Patient denies any shortness of breath or dyspnea on exertion Consider starting patient's home torsemide when kidney function improves Continue aspirin, Coreg Renally adjust medications, avoid nephrotoxic agents/dehydration/hypotension  Essential hypertension Continue Coreg  Obesity class III (BMI 41.07) Patient was counseled about the cardiovascular and metabolic risk of morbid obesity. Patient was counseled for diet control, exercise regimen and weight loss.   DVT prophylaxis: Lovenox  Code Status: Full code  Family Communication: None at bedside  Consults: None  Severity of Illness: The appropriate patient status for this patient is INPATIENT. Inpatient status is judged to be reasonable and necessary in order to provide the required intensity of service to ensure the patient's safety. The patient's presenting symptoms, physical exam findings, and initial radiographic and laboratory data in the  context of their chronic comorbidities is felt to place them at high risk for further clinical deterioration. Furthermore, it is not anticipated that the patient will be medically stable for discharge from the hospital within 2 midnights of admission.   * I certify that at the point of admission it is my clinical judgment that the patient will require inpatient hospital care spanning beyond 2 midnights from the point of admission due to high intensity of service, high risk for further deterioration and high frequency of surveillance required.*  Author: Frankey Shown, DO 10/16/2023 9:19 PM  For on call review [email or web address].

## 2023-10-16 NOTE — ED Triage Notes (Signed)
 Pt arrived via [facility] c/o cough, and flu-like symptoms that began Sunday. Pt presents on 2L Nasal Cannula at baseline in Triage. Pt reports fever of 101F at home. Pt is 102.47F in Triage.

## 2023-10-16 NOTE — Progress Notes (Signed)
 Patient arrived to floor alert and oriented.  Patient has stage 1 to sacrum and stage 3 to right heel. Patient has had continuous cough since admission to floor.  Patient does have glucose sensor on abdomen, but no insulin pump. Patient has redness to right lower leg, edema to both legs, with right greater than left.

## 2023-10-16 NOTE — Sepsis Progress Note (Signed)
 Elink will follow per sepsis protocol.

## 2023-10-16 NOTE — ED Provider Notes (Signed)
 [place] EMERGENCY DEPARTMENT AT [facility] Provider Note   CSN: [id number] Arrival date & time: 10/16/23  1604     History  Chief Complaint  Patient presents with   Cough    Suzanne Tran is a 68 y.o. female.  He has PMH of diabetes, Charcot foot of the right foot, hypertension, sleep apnea, CKD, CHF.  She presents ER today complaining of fever and swelling and redness to the right leg.  She states she is having worsened swelling for the past couple of weeks in her legs bilaterally, she has had her torsemide increased to 100 mg twice daily over the past week.  She still having the swelling but states last night she developed a fever of 101 and had some redness of her leg, today it was worse.  She does have a wound on the right foot but home health has been dressing.  They came out today and stated her wound was looking much better and no longer draining but advised to come to the ER for fever and leg redness.  He also reports dysuria that started over the past day, her PCP called in Cipro but she just filled it today has not taken any.  She states she had an UTI in March and a yeast infection as well, she was treated with medications but started having dysuria again.  She denies flank pain or abdominal pain, denies vomiting.   Cough      Home Medications Prior to Admission medications   Medication Sig Start Date End Date Taking? Authorizing Provider  fluconazole (DIFLUCAN) 150 MG tablet Take 150 mg by mouth once. 09/25/23  Yes [provider]  metolazone (ZAROXOLYN) 5 MG tablet Take 5 mg by mouth daily. 09/23/23  Yes [provider]  ascorbic acid (VITAMIN C) 500 MG tablet Vitamin C 500 mg tablet  Take 1 tablet by oral route.    [provider]  aspirin EC 81 MG tablet Take 1 tablet (81 mg total) by mouth daily. Swallow whole. 01/07/20   Strader, Lennart Pall, PA-C  B Complex Vitamins (VITAMIN B-COMPLEX) TABS Take by mouth.    [provider]  benzonatate (TESSALON) 100 MG capsule Take 100 mg by mouth 3 (three) times daily as needed for cough.    [provider]  brimonidine (ALPHAGAN) 0.2 % ophthalmic solution Place 1 drop into the right eye in the morning.    [provider]  carvedilol (COREG) 3.125 MG tablet Take 1 tablet (3.125 mg total) by mouth 2 (two) times daily with a meal. 03/31/20   Dahal, Melina Schools, MD  Continuous Blood Gluc Sensor (FREESTYLE LIBRE 2 SENSOR) MISC Apply topically. 10/21/20   [provider]  furosemide (LASIX) 20 MG tablet Take 60 mg by mouth 2 (two) times daily.    [provider]  Zella Ball 17 GM/SCOOP powder Take 17 g by mouth daily. 10/03/23   [provider]  insulin lispro (HUMALOG) 100 UNIT/ML KwikPen Inject 5-10 Units into the skin 3 (three) times daily. Sliding scale 11/27/20   [provider]  ipratropium-albuterol (DUONEB) 0.5-2.5 (3) MG/3ML SOLN Take 3 mLs by nebulization every 6 (six) hours as needed (wheezing, shortness of breath).    [provider]  LANTUS SOLOSTAR 100 UNIT/ML Solostar Pen Inject 35 Units into the skin daily.    [provider]  LORazepam (ATIVAN) 0.5 MG tablet Take 0.5 mg by mouth every 4 (four) hours as needed for anxiety. 09/25/23  [provider]  losartan (COZAAR) 100 MG tablet Take 100 mg by mouth daily.    [provider]  Multiple Vitamins-Minerals (SUPER THERA VITE M) TABS Take 1 tablet by mouth daily.    [provider]  potassium chloride (KLOR-CON) 10 MEQ tablet Take 10 mEq by mouth daily. 01/27/20   [provider]  Probiotic Product (MISC INTESTINAL FLORA REGULAT) CAPS Take 1 capsule by mouth daily.    [provider]  spironolactone (ALDACTONE) 25 MG tablet Take 25 mg by mouth 3 (three) times a week.    [provider]  torsemide (DEMADEX) 100 MG tablet Take 50-100 mg by mouth 2 (two) times daily. 100 mg am 50 mg pm 10/15/22    [provider]  Vitamin D, Ergocalciferol, (DRISDOL) 1.25 MG (50000 UNIT) CAPS capsule Take 50,000 Units by mouth once a week. 11/09/20   [provider]  zinc gluconate 50 MG tablet Take by mouth.    [provider]      Allergies    Codeine, Coumadin [warfarin], and Dilantin [phenytoin]    Review of Systems   Review of Systems  Respiratory:  Positive for cough.     Physical Exam Updated Vital Signs BP (!) 147/79   Pulse 86   Temp (!) 102.9 F (39.4 C) (Oral)   Resp (!) 24   Ht 5' 9.5" (1.765 m)   Wt 128 kg   SpO2 96%   BMI 41.07 kg/m  Physical Exam Vitals and nursing note reviewed.  Constitutional:      General: She is not in acute distress.    Appearance: She is well-developed.  HENT:     Head: Normocephalic and atraumatic.  Eyes:     Conjunctiva/sclera: Conjunctivae normal.  Cardiovascular:     Rate and Rhythm: Normal rate and regular rhythm.     Heart sounds: No murmur heard. Pulmonary:     Effort: Pulmonary effort is normal. No respiratory distress.     Breath sounds: Normal breath sounds.  Abdominal:     Palpations: Abdomen is soft.     Tenderness: There is no abdominal tenderness.  Musculoskeletal:        General: Swelling present.     Cervical back: Neck supple.     Comments: Bilateral lower extremity edema, pulses are detectable by Doppler, difficult to feel due to edema. Significant redness and warmth to right lower leg with diffuse tenderness.  There is an ulceration noted to the plantar aspect of the foot with no drainage or crepitus.  Skin:    General: Skin is warm and dry.     Capillary Refill: Capillary refill takes less than 2 seconds.  Neurological:     General: No focal deficit present.     Mental Status: She is alert and oriented to person, place, and time.  Psychiatric:        Mood and Affect: Mood normal.        ED Results / Procedures / Treatments   Labs (all labs ordered are listed, but only abnormal  results are displayed) Labs Reviewed  CBC WITH DIFFERENTIAL/PLATELET - Abnormal; Notable for the following components:      Result Value   WBC 12.4 (*)    RBC 3.65 (*)    Hemoglobin 9.3 (*)    HCT 30.0 (*)    MCH 25.5 (*)    Platelets 420 (*)    Neutro Abs 11.3 (*)    Lymphs Abs 0.5 (*)  Abs Immature Granulocytes 0.23 (*)    All other components within normal limits  BASIC METABOLIC PANEL WITH GFR - Abnormal; Notable for the following components:   Sodium 130 (*)    Chloride 88 (*)    Glucose, Bld 145 (*)    BUN 74 (*)    Creatinine, Ser 2.27 (*)    Calcium 8.5 (*)    GFR, Estimated 23 (*)    All other components within normal limits  RESP PANEL BY RT-PCR (RSV, FLU A&B, COVID)  RVPGX2  CULTURE, BLOOD (ROUTINE X 2)  CULTURE, BLOOD (ROUTINE X 2)  LACTIC ACID, PLASMA  URINALYSIS, ROUTINE W REFLEX MICROSCOPIC  URINALYSIS, W/ REFLEX TO CULTURE (INFECTION SUSPECTED)    EKG None  Radiology DG Chest 2 View Result Date: 10/16/2023 CLINICAL DATA:  Shortness of breath EXAM: CHEST - 2 VIEW COMPARISON:  Chest x-ray 06/18/2022 FINDINGS: The heart is enlarged. There central pulmonary vascular congestion. There is no focal lung infiltrate, pleural effusion or pneumothorax. There are 2 nodular densities in the left suprahilar region. One appears vascular in the other is indeterminate measuring up to 16 mm. No acute fracture identified. IMPRESSION: 1. Cardiomegaly with central pulmonary vascular congestion. 2. There are 2 nodular densities in the left suprahilar region. One appears vascular in the other is indeterminate measuring up to 16 mm. Recommend further evaluation with CT. Electronically Signed   By: Darliss Cheney M.D.   On: 10/16/2023 18:17    Procedures Procedures    Medications Ordered in ED Medications  lactated ringers infusion ( Intravenous New Bag/Given 10/16/23 1841)  vancomycin (VANCOCIN) IVPB 1000 mg/200 mL premix (1,000 mg Intravenous New Bag/Given 10/16/23 1850)   acetaminophen (TYLENOL) tablet 1,000 mg (1,000 mg Oral Given 10/16/23 1621)  ceFEPIme (MAXIPIME) 2 g in sodium chloride 0.9 % 100 mL IVPB (0 g Intravenous Stopped 10/16/23 1832)    ED Course/ Medical Decision Making/ A&P                                 Medical Decision Making This patient presents to the ED for concern of fever with right leg redness and swelling, this involves an extensive number of treatment options, and is a complaint that carries with it a high risk of complications and morbidity.  The differential diagnosis includes  cellulitis, abscess, sepsis, osteomyelitis, UTI, other   Co morbidities that complicate the patient evaluation :   Diabetes, Charcot deformity of right foot, CHF   Additional history obtained:  Additional history obtained from EMR External records from outside source obtained and reviewed including prior notes and labs   Lab Tests:  I Ordered, and personally interpreted labs.  The pertinent results include: CBC shows leukocytosis white blood cell count of 12.4, hemoglobin stable at 9.4, BMP shows BUN and creatinine elevated from baseline at 74 and 2.27, her baseline For her line creatinine is approximately 1.7-1.9 based on prior records   Imaging Studies ordered:  I ordered imaging studies including Xray right foot which shows Charcot deformity, no soft tissue gas; chest x-ray shows X-ray right foot cardiomegaly with central pulmonary vascular congestion and left suprahilar nodules I independently visualized and interpreted imaging within scope of identifying emergent findings  I agree with the radiologist interpretation of chest x-ray, interpretation of x-rays pending at this time   Cardiac Monitoring: / EKG:  The patient was maintained on a cardiac monitor.  I personally viewed and interpreted the  cardiac monitored which showed an underlying rhythm of: Atrial fibrillation rate in the 90s   Consultations Obtained:  I requested consultation  with the hospitalist, still pending call back   Problem List / ED Course / Critical interventions / Medication management  Sepsis due to right lower extremity cellulitis.  Patient also having dysuria UA is pending, negative for COVID flu RSV, she is a mildly worsened renal function on her chronic kidney disease, lactic acid normal.  She is not hypotensive, but she does meet sepsis criteria does not need the full fluid bolus.  For sepsis.  Found to be in A-fib which is new for her as well.  Awaiting hospitalist callback for admission she was given vancomycin and cefepime to treat her infection I ordered medication including tylenol  for fever  Reevaluation of the patient after these medicines showed that the patient improved I have reviewed the patients home medicines and have made adjustments as needed    Signed out to Burgess Amor, PA-C pending hospitalist consult for admission at this time.   Amount and/or Complexity of Data Reviewed Labs: ordered. Radiology: ordered.  Risk OTC drugs. Prescription drug management. Decision regarding hospitalization.           Final Clinical Impression(s) / ED Diagnoses Final diagnoses:  Sepsis, due to unspecified organism, unspecified whether acute organ dysfunction present [facility])  Cellulitis of right lower leg  Atrial fibrillation, unspecified type [facility])    Rx / DC Orders ED Discharge Orders     None         Josem Kaufmann 10/16/23 1917    Durwin Glaze, MD 10/16/23 2336

## 2023-10-17 ENCOUNTER — Inpatient Hospital Stay (HOSPITAL_COMMUNITY)

## 2023-10-17 ENCOUNTER — Encounter (HOSPITAL_COMMUNITY): Payer: Self-pay | Admitting: Internal Medicine

## 2023-10-17 DIAGNOSIS — L0390 Cellulitis, unspecified: Secondary | ICD-10-CM | POA: Diagnosis not present

## 2023-10-17 DIAGNOSIS — Z515 Encounter for palliative care: Secondary | ICD-10-CM | POA: Diagnosis not present

## 2023-10-17 DIAGNOSIS — N184 Chronic kidney disease, stage 4 (severe): Secondary | ICD-10-CM | POA: Insufficient documentation

## 2023-10-17 DIAGNOSIS — R7881 Bacteremia: Secondary | ICD-10-CM | POA: Insufficient documentation

## 2023-10-17 DIAGNOSIS — A419 Sepsis, unspecified organism: Secondary | ICD-10-CM | POA: Diagnosis not present

## 2023-10-17 DIAGNOSIS — Z7189 Other specified counseling: Secondary | ICD-10-CM | POA: Diagnosis not present

## 2023-10-17 LAB — BLOOD CULTURE ID PANEL (REFLEXED) - BCID2

## 2023-10-17 LAB — COMPREHENSIVE METABOLIC PANEL WITH GFR
ALT: 8 U/L (ref 0–44)
AST: 20 U/L (ref 15–41)
Albumin: 2.6 g/dL — ABNORMAL LOW (ref 3.5–5.0)
Alkaline Phosphatase: 45 U/L (ref 38–126)
Anion gap: 13 (ref 5–15)
BUN: 75 mg/dL — ABNORMAL HIGH (ref 8–23)
CO2: 26 mmol/L (ref 22–32)
Calcium: 8 mg/dL — ABNORMAL LOW (ref 8.9–10.3)
Chloride: 91 mmol/L — ABNORMAL LOW (ref 98–111)
Creatinine, Ser: 2.3 mg/dL — ABNORMAL HIGH (ref 0.44–1.00)
GFR, Estimated: 23 mL/min — ABNORMAL LOW (ref >60)
Glucose, Bld: 193 mg/dL — ABNORMAL HIGH (ref 70–99)
Potassium: 4.8 mmol/L (ref 3.5–5.1)
Sodium: 130 mmol/L — ABNORMAL LOW (ref 135–145)
Total Bilirubin: 1.1 mg/dL (ref 0.0–1.2)
Total Protein: 6.5 g/dL (ref 6.5–8.1)

## 2023-10-17 LAB — URINALYSIS, W/ REFLEX TO CULTURE (INFECTION SUSPECTED)
Bacteria, UA: NONE SEEN
Bilirubin Urine: NEGATIVE
Glucose, UA: NEGATIVE mg/dL
Ketones, ur: NEGATIVE mg/dL
Nitrite: NEGATIVE
Protein, ur: 100 mg/dL — AB
Specific Gravity, Urine: 1.014 (ref 1.005–1.030)
WBC, UA: >50 WBC/hpf (ref 0–5)
pH: 5 (ref 5.0–8.0)

## 2023-10-17 LAB — CBC
HCT: 26.4 % — ABNORMAL LOW (ref 36.0–46.0)
Hemoglobin: 8.2 g/dL — ABNORMAL LOW (ref 12.0–15.0)
MCH: 25.5 pg — ABNORMAL LOW (ref 26.0–34.0)
MCHC: 31.1 g/dL (ref 30.0–36.0)
MCV: 82.2 fL (ref 80.0–100.0)
Platelets: 280 10*3/uL (ref 150–400)
RBC: 3.21 MIL/uL — ABNORMAL LOW (ref 3.87–5.11)
RDW: 14.6 % (ref 11.5–15.5)
WBC: 9.1 10*3/uL (ref 4.0–10.5)
nRBC: 0 % (ref 0.0–0.2)

## 2023-10-17 LAB — GLUCOSE, CAPILLARY
Glucose-Capillary: 184 mg/dL — ABNORMAL HIGH (ref 70–99)
Glucose-Capillary: 188 mg/dL — ABNORMAL HIGH (ref 70–99)
Glucose-Capillary: 185 mg/dL — ABNORMAL HIGH (ref 70–99)
Glucose-Capillary: 214 mg/dL — ABNORMAL HIGH (ref 70–99)

## 2023-10-17 LAB — SODIUM, URINE, RANDOM: Sodium, Ur: 16 mmol/L

## 2023-10-17 LAB — OSMOLALITY: Osmolality: 306 mosm/kg — ABNORMAL HIGH (ref 275–295)

## 2023-10-17 LAB — PHOSPHORUS: Phosphorus: 3.9 mg/dL (ref 2.5–4.6)

## 2023-10-17 LAB — HIV ANTIBODY (ROUTINE TESTING W REFLEX): HIV Screen 4th Generation wRfx: NONREACTIVE

## 2023-10-17 LAB — OSMOLALITY, URINE: Osmolality, Ur: 329 mosm/kg (ref 300–900)

## 2023-10-17 LAB — HEMOGLOBIN A1C
Hgb A1c MFr Bld: 7.4 % — ABNORMAL HIGH (ref 4.8–5.6)
Mean Plasma Glucose: 165.68 mg/dL

## 2023-10-17 LAB — MAGNESIUM: Magnesium: 2.4 mg/dL (ref 1.7–2.4)

## 2023-10-17 MED ORDER — ENOXAPARIN SODIUM 60 MG/0.6ML IJ SOSY
60.0000 mg | PREFILLED_SYRINGE | INTRAMUSCULAR | Status: DC
  Administered 2023-10-17: 60 mg via SUBCUTANEOUS
  Filled 2023-10-17: qty 0.6

## 2023-10-17 MED ORDER — MENTHOL 3 MG MT LOZG
1.0000 | LOZENGE | OROMUCOSAL | Status: DC | PRN
  Administered 2023-10-17: 3 mg via ORAL
  Filled 2023-10-17: qty 9

## 2023-10-17 MED ORDER — DIPHENHYDRAMINE HCL 25 MG PO CAPS: 25.0000 mg | ORAL_CAPSULE | Freq: Four times a day (QID) | ORAL | Status: DC | PRN

## 2023-10-17 MED ORDER — HYDROCOD POLI-CHLORPHE POLI ER 10-8 MG/5ML PO SUER
5.0000 mL | Freq: Two times a day (BID) | ORAL | Status: DC | PRN
  Administered 2023-10-17 (×2): 5 mL via ORAL
  Filled 2023-10-17 (×2): qty 5

## 2023-10-17 MED ORDER — BENZONATATE 100 MG PO CAPS
200.0000 mg | ORAL_CAPSULE | Freq: Three times a day (TID) | ORAL | Status: DC | PRN
Start: 2023-10-17 — End: 2023-10-19
  Administered 2023-10-17 – 2023-10-18 (×2): 200 mg via ORAL
  Filled 2023-10-17 (×2): qty 2

## 2023-10-17 MED ORDER — LACTATED RINGERS IV SOLN: INTRAVENOUS | Status: AC

## 2023-10-17 MED ORDER — CARVEDILOL 3.125 MG PO TABS
3.1250 mg | ORAL_TABLET | Freq: Two times a day (BID) | ORAL | Status: DC
  Administered 2023-10-18 (×2): 3.125 mg via ORAL
  Filled 2023-10-17 (×2): qty 1

## 2023-10-17 NOTE — Assessment & Plan Note (Signed)
BMI 42

## 2023-10-17 NOTE — Hospital Course (Signed)
 68 y.o. F with dCHF, HTN, CKD IV baseline 1.9, renal mass, obesity, OSA and right charcot foot who presented with fever and right foot swelling.  Blood cultures subsequently growing GNRs.

## 2023-10-17 NOTE — Assessment & Plan Note (Signed)
 Unclear at this time if she has AKI or not.  Last Cr in our system was 1.9  and that was >1 year ago.  Cr here is 2.3 and unchanged overnight. - Monitor Cr

## 2023-10-17 NOTE — TOC Initial Note (Signed)
 Transition of Care [facility]) - Initial/Assessment Note    Patient Details  Name: Suzanne Tran MRN: [id number] Date of Birth: [birth year]  [facility]) CM/SW Contact:    Villa Herb, LCSWA Phone Number: 10/17/2023, 11:43 AM  Clinical Narrative:                 CSW notes per chart review that pt is under hospice care at home. CSW spoke to [place] with [facility] who confirms pt is under their services. CSW updated by [facility] with [facility] that she has visited with pt in room and their SW has been working on getting pt placement. Misty will provide CSW with update on if pt will be revoking hospice in hopes of SNF placement. Misty will call and speak with MD at his request as well. TOC to follow.   Expected Discharge Plan: Skilled Nursing Facility Barriers to Discharge: Continued Medical Work up   Patient Goals and CMS Choice Patient states their goals for this hospitalization and ongoing recovery are:: get stronger CMS Medicare.gov Compare Post Acute Care list provided to:: Patient Choice offered to / list presented to : Patient [facility] ownership interest in [facility].provided to:: Patient    Expected Discharge Plan and Services In-house Referral: Clinical Social Work Discharge Planning Services: CM Consult Post Acute Care Choice: Skilled Nursing Facility Living arrangements for the past 2 months: Single Family Home                                      Prior Living Arrangements/Services Living arrangements for the past 2 months: Single Family Home Lives with:: Self Patient language and need for interpreter reviewed:: Yes Do you feel safe going back to the place where you live?: Yes      Need for Family Participation in Patient Care: Yes (Comment) Care giver support system in place?: Yes (comment) Current home services: Hospice Criminal Activity/Legal Involvement Pertinent to Current Situation/Hospitalization: No - Comment as  needed  Activities of Daily Living   ADL Screening (condition at time of admission) Independently performs ADLs?: No Does the patient have a NEW difficulty with bathing/dressing/toileting/self-feeding that is expected to last >3 days?: Yes (Initiates electronic notice to provider for possible OT consult) Does the patient have a NEW difficulty with getting in/out of bed, walking, or climbing stairs that is expected to last >3 days?: Yes (Initiates electronic notice to provider for possible PT consult) Does the patient have a NEW difficulty with communication that is expected to last >3 days?: No Is the patient deaf or have difficulty hearing?: No Does the patient have difficulty seeing, even when wearing glasses/contacts?: No Does the patient have difficulty concentrating, remembering, or making decisions?: No  Permission Sought/Granted                  Emotional Assessment Appearance:: Appears stated age       Alcohol / Substance Use: Not Applicable Psych Involvement: No (comment)  Admission diagnosis:  Cellulitis of right lower leg [L03.115] Sepsis due to cellulitis (HCC) [L03.90, A41.9] Atrial fibrillation, unspecified type (HCC) [I48.91] Sepsis, due to unspecified organism, unspecified whether acute organ dysfunction present [facility]) [A41.9] Patient Active Problem List   Diagnosis Date Noted   Sepsis due to cellulitis (HCC) 10/16/2023   Open wound of plantar aspect of right foot 10/16/2023   Pressure injury of skin 10/16/2023   Leukocytosis 10/16/2023   Thrombocytosis  10/16/2023   Acute kidney injury superimposed on stage 4 chronic kidney disease (HCC) 10/16/2023   Type 2 diabetes mellitus with hyperglycemia (HCC) 10/16/2023   Obesity, Class III, BMI 40-49.9 (morbid obesity) (HCC) 10/16/2023   Hyponatremia 10/16/2023   Iron deficiency anemia 12/15/2021   Cellulitis of right lower extremity 01/21/2021   COVID-19 virus infection 01/21/2021   Cellulitis 01/21/2021    Essential hypertension    Mixed hyperlipidemia    OSA (obstructive sleep apnea)    Normocytic anemia    Chronic diastolic heart failure (HCC)    Acute on chronic systolic (congestive) heart failure (HCC) 03/19/2020   CKD (chronic kidney disease) stage 3, GFR 30-59 ml/min (HCC)    Type 2 diabetes mellitus (HCC)    Hypertensive urgency    Ulcer of right foot (HCC)    PCP:  Benita Stabile, MD Pharmacy:   [facility] [address], [place] [address] [place] [address] [place] [address] Phone: [phone] Fax: [phone]     Social Drivers of Health (SDOH) Social History: SDOH Screenings   Food Insecurity: No Food Insecurity (10/16/2023)  Housing: Low Risk  (10/16/2023)  Transportation Needs: No Transportation Needs (10/16/2023)  Utilities: Not At Risk (10/16/2023)  Financial Resource Strain: Low Risk  (07/06/2021)   Received from [facility], [facility]  Social Connections: Socially Isolated (10/16/2023)  Stress: No Stress Concern Present (07/06/2021)   Received from [facility], [facility]  Tobacco Use: Low Risk  (10/16/2023)   SDOH Interventions:     Readmission Risk Interventions    10/17/2023   11:42 AM  Readmission Risk Prevention Plan  Transportation Screening Complete  Home Care Screening Complete  Medication Review (RN CM) Complete

## 2023-10-17 NOTE — Consult Note (Signed)
 WOC Nurse Consult Note: patient with charcot foot deformity, has been followed by podiatry [facility] for same last 08/2022 and was utilizing total contact casting at that time; no documentation of follow-up can be found since that time; patient states she is receiving care through home health  Reason for Consult: R plantar foot wound  Wound type: full thickness r/t neuropathy and charcot foot deformity  Pressure Injury POA: NA  Measurement: see nursing flowsheet  Wound bed: dark discoloration surrounding by heavy callusing  Drainage (amount, consistency, odor) none per MD note  Periwound: callused, charcot foot deformity  Dressing procedure/placement/frequency: Wash plantar R foot with soap and water, dry and apply Xeroform gauze [place] [phone]) to wound bed daily, cover with dry gauze and silicone foam or Kerlix roll gauze whichever is preferred.   Patient should return to podiatry or wound care center for management of this wound for debridement of heavy callusing and possible total contact casting.    POC discussed with bedside nurse. WOC team will not follow. Re-consult if further needs arise.   Thank you,    Priscella Mann MSN, RN-BC, [employer] [phone]

## 2023-10-17 NOTE — Assessment & Plan Note (Addendum)
 Hypertension Has been enrolled in hospice for qualifying diagnosis of CHF and renal failure.   BP trending up - Hold torsemide and spironolactone one more day - Stop IV fluids - Continue carvedilol

## 2023-10-17 NOTE — Assessment & Plan Note (Signed)
 -  CPAP at night

## 2023-10-17 NOTE — Assessment & Plan Note (Signed)
 Na unchanged at 130 - Continue IV fluids for now

## 2023-10-17 NOTE — Progress Notes (Signed)
 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 is a current [facility] patient with a terminal diagnosis of hypertensive heart and chronic kidney disease with heart failure. She reports a couple of days of worsening swelling and shortness of breath, ultimately she has made the decision to call 911. [facility] was notified later in the evening with the therapist called to schedule her visit. She was admitted on 4.2 with a diagnosis of Sepsis. Per Dr. Patric Dykes with [facility] this is a related hospital admission.   Report exchanged with TOC and hospitalist. Visited at bedside, patient coughing uncontrollably and reports that the nurse has given her something but it was only about 8m ago. When she is able to talk she verbalizes that she wants to improve if possible and would like to be discharged to a facility to attempt to get stronger. We discussed that this would likely entail revoking hospice services. She would like to give it a day or two to see how she does.   Patient is inpatient appropriate with need for IVAB and treatment for acute illness.   Vital Signs- 100.6/81/15    142/79     spO2 91% on 2L nasal cannula      Intake/Output- not yet recorded Abnormal labs- Na+ 130, Chloride 91, Glucose 193, BUN 75, Creatinine 2.3, Ca+ 8, Albumin 2.6, GFR 23, RBC 3.21, Hgb 8.2, Hct 26.4, UTI likely Diagnostics-  CHEST - 2 VIEW IMPRESSION: 1. Cardiomegaly with central pulmonary vascular congestion. 2. There are 2 nodular densities in the left suprahilar region. One appears vascular in the other is indeterminate measuring up to 16 mm. Recommend further evaluation with CT.   Electronically Signed   By: Darliss Cheney M.D.   On: 10/16/2023 18:17      IV/PRN Meds- Cefepime 2g IV once, Rocephin 2g IV q24H, LR @100ml /H continuous, Linezolid 600mg  IV q12 H, Vancomycin 1g IV once, Tussionex 5ml PO q12H prn x2  Problem List as per H&P Dr.  Frankey Shown 4.2.25 Sepsis due to cellulitis Patient met sepsis criteria due to being febrile, tachypneic and having leukocytosis and source of infection being the skin (cellulitis) Patient was started on IV vancomycin and cefepime, we shall continue with Zyvox and ceftriaxone per pharmacist's recommendation    Right plantar wound Continue wound care Continue antibiotics as indicated above   Leukocytosis possibly due to cellulitis vs reactive Continue management as described for cellulitis   Thrombocytosis possibly reactive Platelets 420, continue to monitor platelet levels   Acute kidney injury on CKD stage 4 Dehydration BUN/creatinine 74/2.27 (baseline creatinine at 1.91) Continue gentle hydration Renally adjust medications, avoid nephrotoxic agents/dehydration/hypotension   Hyponatremia possibly due to dehydration Na 130, this is possibly due to diuretic use Continue gentle hydration and monitor sodium levels Urine osmolality, serum osmolality and urine sodium will be checked   Type 2 diabetes mellitus with hyperglycemia Continue ISS and hypoglycemia protocol   Chronic diastolic congestive heart failure Chest x-ray showed cardiomegaly with central pulmonary vascular congestion. Patient denies any shortness of breath or dyspnea on exertion Consider starting patient's home torsemide when kidney function improves Continue aspirin, Coreg Renally adjust medications, avoid nephrotoxic agents/dehydration/hypotension  Discharge Planning- Ongoing back home vs skilled facility pending progress Family Contact- None, patient is her own spokesperson IDT- Updated Goals of care - Full code Thea Gist, RN, [facility] Liaison [phone]

## 2023-10-17 NOTE — Progress Notes (Signed)
  Progress Note   Patient: Suzanne Tran ZOX:[id number] DOB: Oct 28, 1955 DOA: 10/16/2023     1 DOS: the patient was seen and examined on 10/17/2023 at 8:45AM      Brief hospital course: 68 y.o. F with dCHF, HTN, CKD IV baseline 1.9, obesity, OSA and right charcot foot who presented with fever and right foot swelling.  Blood cultures subsequently growing GNRs.     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  Febrile overnight.   - Continue Rocephin and linezolid - Obtain MRI foot    CKD (chronic kidney disease), stage IV (HCC) Unclear at this time if she has AKI or not.  Last Cr in our system was 1.9  and that was >1 year ago.  Cr here is 2.3 and unchanged overnight. - Monitor Cr  Hyponatremia Na unchanged at 130 - Continue IV fluids for now  Obesity, Class III, BMI 40-49.9 (morbid obesity) (HCC) BMI 42  Type 2 diabetes mellitus with hyperglycemia (HCC) Glucose elevated - Hold home Lantus - Continue SS corrections  Pressure injury of skin Sacrum stage I, POA The heel wound is a diabetic ulcer, not pressure ulcer  Chronic diastolic heart failure (HCC) Hypertension Has been enrolled in hospice for qualifying diagnosis of CHF and renal failure.  Appears dehydrated, BP soft this morning - Hold torsemide and spironolactone today - Continue IV fluids - Resume carvedilol  Normocytic anemia Hgb slightly down at 8.2 g/dL.  No bleeding observed or reported.  OSA (obstructive sleep apnea) - CPAP at night          Subjective: Tired, coughing a lot, weak.  Foot still pain.  Fever ovrenight.     Physical Exam: BP (!) 142/79 (BP Location: Right Arm)   Pulse 81   Temp (!) 100.6 F (38.1 C) (Oral)   Resp 14   Ht 5\' 10"  (1.778 m)   Wt 134.7 kg   SpO2 91%   BMI 42.61 kg/m   Adult female, lying in bed, interactive  and appropriate RRR, no murmurs, no peripheral edema Respiratory normal, lungs clear without rales or wheezes Abdomen soft, no tenderness palpation Attention normal, affect normal, judgment and insight appear normal, severe generalized weakness   Data Reviewed: Basic metabolic panel shows no change in sodium and creatinine CBC shows hemoglobin down to 8.2, white count down to 9 Blood cultures chronic gram-negative rods  Family Communication:     Disposition: Status is: Inpatient 68 year old female on hospice for CHF and chronic kidney disease presented with fever and right foot infection  Obtain MRI foot negative for osteomyelitis or fluid collection, will consult orthopedics  In the meantime, continue antibiotics, follow-up culture data        Author: Alberteen Sam, MD 10/17/2023 5:38 PM  For on call review [email or web address].

## 2023-10-17 NOTE — Assessment & Plan Note (Signed)
 Sacrum stage I, POA The heel wound is a diabetic ulcer, not pressure ulcer

## 2023-10-17 NOTE — Progress Notes (Signed)
 Patient has refused CPAP due to inability to wear the mask. Patient wears 2LPM [place] at home during the night. If any changes RT will supply patient with unit.

## 2023-10-17 NOTE — Progress Notes (Addendum)
 CRITICAL VALUE STICKER  CRITICAL VALUE: positive blood cultures anaerobic bottle gram negative rods   RECEIVER (on-site recipient of call): Marvell Fuller, RN   DATE & TIME NOTIFIED: 10/17/2023 at 1145  MESSENGER (representative from lab): Salley Slaughter  MD NOTIFIED: Dr. Maryfrances Bunnell  TIME OF NOTIFICATION: 10/17/2023 at 1146  RESPONSE: see new orders

## 2023-10-17 NOTE — Assessment & Plan Note (Signed)
 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 MRI showed ulcer tracking to bone, and osteomyelitis of the metatarsals and cuboid.  Discussed with Orthopedics, BKA recommended.  Discussed with Podiatry in [place], agreed, BKA recommended.   - Consult ID - Continue IV antibiotics   - Consult Palliative care and chaplain

## 2023-10-17 NOTE — Assessment & Plan Note (Addendum)
 Hgb stably low due to infection.  No bleeding observed or reported.

## 2023-10-17 NOTE — Assessment & Plan Note (Signed)
 Glucose elevated - Resume home Lantus - Continue SS corrections

## 2023-10-17 NOTE — Progress Notes (Signed)
 CRITICAL VALUE STICKER  CRITICAL VALUE: blood cultures positive 1 out of 4 bottles positive for Proteus Species  RECEIVER (on-site recipient of call): Marvell Fuller, RN   DATE & TIME NOTIFIED: 10/17/23 at 1745  MESSENGER (representative from lab): [facility] microbiology lab  MD NOTIFIED: Dr. Pilar Grammes  TIME OF NOTIFICATION: 10/17/23 at 1745  RESPONSE:  no new orders

## 2023-10-17 NOTE — Plan of Care (Signed)
  Problem: Education: Goal: Knowledge of General Education information will improve Description: Including pain rating scale, medication(s)/side effects and non-pharmacologic comfort measures Outcome: Progressing   Problem: Health Behavior/Discharge Planning: Goal: Ability to manage health-related needs will improve Outcome: Progressing   Problem: Coping: Goal: Level of anxiety will decrease Outcome: Progressing   Problem: Elimination: Goal: Will not experience complications related to bowel motility Outcome: Progressing   Problem: Pain Managment: Goal: General experience of comfort will improve and/or be controlled Outcome: Progressing   Problem: Safety: Goal: Ability to remain free from injury will improve Outcome: Progressing   Problem: Clinical Measurements: Goal: Ability to avoid or minimize complications of infection will improve Outcome: Progressing

## 2023-10-17 NOTE — Plan of Care (Signed)

## 2023-10-17 NOTE — Progress Notes (Signed)
 Pt has not voided since being admitted to the floor, bladder scan performed and of urine noted. Pt has order for urine culture pending, MD made aware of bladder scan volume.

## 2023-10-17 NOTE — Consult Note (Signed)
 Consultation Note Date: 10/17/2023   Patient Name: Suzanne Tran  DOB: Sep 23, 1955  MRN: [id number]  Age / Sex: 68 y.o., female  PCP: Benita Stabile, MD Referring Physician: Alberteen Sam, *  Reason for Consultation: Establishing goals of care  HPI/Patient Profile: 68 y.o. female  with past medical history of patient of [facility], DM2, HTN/HLD, CKD 3, right Charcot's foot, OSA, CHF, obesity admitted on 10/16/2023 with sepsis due to cellulitis, right plantar wound.   Clinical Assessment and Goals of Care: Face-to-face discussion with transition of care team and bedside nursing staff related to patient condition, needs, goals of care.  I have reviewed medical records including EPIC notes, labs and imaging, received report from RN, assessed the patient.  Suzanne Tran is lying quietly in bed.  She appears acutely/chronically ill and frail, obese.  She greets me, making and somewhat keeping eye contact.  She is alert and oriented x 3, able to make her basic needs known.  There is no family at bedside at this time.  We meet at the bedside to discuss diagnosis prognosis, GOC, EOL wishes, disposition and options.  I introduced Palliative Medicine as specialized medical care for people living with serious illness. It focuses on providing relief from the symptoms and stress of a serious illness. The goal is to improve quality of life for both the patient and the family.  We discussed a brief life review of the patient.  Suzanne Tran tells me that she was raised in [place].  She has 4 siblings 3 of which live in [place] now.  She tells me that she has been divorced for many years and has 1 child.  She has been active with [facility] for hospice care.  Per notes it seems that [facility] social worker has been attempting to help her with SNF/placement.  We then focused on their current illness.  We talk about Suzanne Tran  acute and chronic health concerns.  We talked about her foot wound.  She tells me that she has had 2 surgeries on her foot and multiple on her knee throughout the years.  Suzanne Tran tells me that this wound is only a few days old.  I shared that there is concern about infection in this wound.  We talked about time for studies, testing, outcomes.  I shared with Mrs. [name] that palliative medicine asks patients to consider "what if's and maybe's".  I asked her to consider what is she would except surgery/amputation for her right foot if needed.  She states that this has not been discussed with her and she is unsure.  I encouraged her to consider what she would and would not want.  She tells me initially that she would not want amputation.  I asked her even if that means she dies without amputation.  She states that again, she is unsure and would need time to think about that.  I share that the medical team would respect her wishes regardless of her choices.  The natural disease  trajectory and expectations at EOL were discussed.  I attempted to elicit values and goals of care important to the patient.  The difference between aggressive medical intervention and comfort care was considered in light of the patient's goals of care.   Advanced directives, concepts specific to code status, artifical feeding and hydration, and rehospitalization were considered and discussed.  We talked about the concept of "treat the treatable, but allow a natural passing".  Suzanne Tran states that she would want attempted resuscitation.  She states that she has not considered how long she would take life support.  I encouraged her to consider a few "what if's and maybe's".  She tells me that at this point, she has left decision making on how long for life support to her brother [name].  Ms. Swenson is active with [facility] for at home hospice care with [facility] social worker attempting to get placement for patient.  And Palliative Care services  outpatient were explained and offered.  Discussed the importance of continued conversation with family and the medical providers regarding overall plan of care and treatment options, ensuring decisions are within the context of the patient's values and GOCs. Questions and concerns were addressed.  The family was encouraged to call with questions or concerns.  PMT will continue to support holistically.  Conference with attending, bedside nursing staff, transition of care team related to patient condition, needs, goals of care, disposition.   HCPOA HCPOA -brother, Lajuan Lines is legal HCPOA per patient.  She is divorced many years ago and has 1 child, a daughter.  She also has 4 siblings.    SUMMARY OF RECOMMENDATIONS   Full scope/full code Time for outcomes Considering whether she would accept surgery/amputation for right foot if needed Anticipate discharge to short-term rehab    Code Status/Advance Care Planning: Full code - We talked about the concept of "treat the treatable, but allow a natural passing".  Suzanne Tran states that she would want attempted resuscitation.  She states that she has not considered how long she would take life support.  I encouraged her to consider a few "what if's and maybe's".  She tells me that at this point, she has left decision making on how long for life support to her brother [name].  Symptom Management:  Per hospitalist, no additional needs at this time.  Palliative Prophylaxis:  Frequent Pain Assessment, Oral Care, Palliative Wound Care, and Turn Reposition  Additional Recommendations (Limitations, Scope, Preferences): Full Scope Treatment  Psycho-social/Spiritual:  Desire for further Chaplaincy support:no Additional Recommendations: Caregiving  Support/Resources and Education on Hospice  Prognosis:  Unable to determine, based on outcomes.  Guarded at this point related to acuity of illness, bacteremia.  Discharge Planning: To Be Determined       Primary Diagnoses: Present on Admission:  Sepsis due to cellulitis (HCC)  Chronic diastolic heart failure (HCC)  Essential hypertension   I have reviewed the medical record, interviewed the patient and family, and examined the patient. The following aspects are pertinent.  Past Medical History:  Diagnosis Date   Charcot ankle, right    CHF (congestive heart failure) (HCC)    CKD (chronic kidney disease) stage 3, GFR 30-59 ml/min (HCC)    Diabetic Charcot foot (HCC)    Essential hypertension    Mixed hyperlipidemia    Nephrolithiasis    Neuropathy    OSA (obstructive sleep apnea)    Type 2 diabetes mellitus (HCC)    Social History   Socioeconomic History  Marital status: Divorced    Spouse name: Not on file   Number of children: Not on file   Years of education: Not on file   Highest education level: Not on file  Occupational History   Not on file  Tobacco Use   Smoking status: Never   Smokeless tobacco: Never  Vaping Use   Vaping status: Never Used  Substance and Sexual Activity   Alcohol use: Never   Drug use: Never   Sexual activity: Not on file  Other Topics Concern   Not on file  Social History Narrative   Not on file   Social Drivers of Health   Financial Resource Strain: Low Risk  (07/06/2021)   [facility] from [facility]   Overall Financial Resource Strain (CARDIA)    Difficulty of Paying Living Expenses: Not hard at all  Food Insecurity: No Food Insecurity (10/16/2023)   Hunger Vital Sign    Worried About Running Out of Food in the Last Year: Never true    Ran Out of Food in the Last Year: Never true  Transportation Needs: No Transportation Needs (10/16/2023)   PRAPARE - Administrator, Civil Service (Medical): No    Lack of Transportation (Non-Medical): No  Physical Activity: Not on file  Stress: No Stress Concern Present (07/06/2021)   Received from [facility]  Occupational Health - Occupational Stress Questionnaire    Feeling of Stress : Not at all  Social Connections: Socially Isolated (10/16/2023)   Social Connection and Isolation Panel [NHANES]    Frequency of Communication with Friends and Family: Once a week    Frequency of Social Gatherings with Friends and Family: Once a week    Attends Religious Services: Never    Database administrator or Organizations: No    Attends Engineer, structural: Never    Marital Status: Divorced   Family History  Problem Relation Age of Onset   Diabetes Mellitus II Mother    Cancer Mother    Hypertension Father    CAD Father    Diabetes Mellitus II Father    Diabetes Sister    Diabetes Brother    Scheduled Meds:  enoxaparin (LOVENOX) injection  60 mg Subcutaneous Q24H   insulin aspart  0-9 Units Subcutaneous TID WC   Continuous Infusions:  cefTRIAXone (ROCEPHIN)  IV 2 g (10/17/23 0839)   lactated ringers     linezolid 600 mg (10/17/23 1041)   PRN Meds:.acetaminophen **OR** acetaminophen, benzonatate, chlorpheniramine-HYDROcodone, diphenhydrAMINE, menthol-cetylpyridinium, prochlorperazine Medications Prior to Admission:  Prior to Admission medications   Medication Sig Start Date End Date Taking? Authorizing Provider  ascorbic acid (VITAMIN C) 500 MG tablet Vitamin C 500 mg tablet  Take 1 tablet by oral route.   Yes [provider]  aspirin EC 81 MG tablet Take 1 tablet (81 mg total) by mouth daily. Swallow whole. 01/07/20  Yes Strader, [place] M, PA-C  B Complex Vitamins (VITAMIN B-COMPLEX) TABS Take by mouth.   Yes [provider]  benzonatate (TESSALON) 100 MG capsule Take 100 mg by mouth 3 (three) times daily as needed for cough.   Yes [provider]  Carboxymethylcellulose Sodium (THERATEARS EXTRA) 0.25 % SOLN Apply 1-2 drops to eye daily as needed (dry eyes).   Yes [provider]  carvedilol (COREG) 3.125 MG tablet Take 1 tablet (3.125 mg total) by  mouth 2 (two) times daily with a meal. 03/31/20  Yes  Lorin Glass, MD  fluconazole (DIFLUCAN) 150 MG tablet Take 150 mg by mouth once. 09/25/23  Yes [provider]  insulin lispro (HUMALOG) 100 UNIT/ML KwikPen Inject 5-10 Units into the skin 3 (three) times daily. Sliding scale 11/27/20  Yes [provider]  ipratropium-albuterol (DUONEB) 0.5-2.5 (3) MG/3ML SOLN Take 3 mLs by nebulization every 6 (six) hours as needed (wheezing, shortness of breath).   Yes [provider]  LANTUS SOLOSTAR 100 UNIT/ML Solostar Pen Inject 35 Units into the skin daily.   Yes [provider]  Multiple Vitamin (MULTIVITAMIN) tablet Take 1 tablet by mouth daily.   Yes [provider]  Probiotic Product (MISC INTESTINAL FLORA REGULAT) CAPS Take 1 capsule by mouth daily.   Yes [provider]  spironolactone (ALDACTONE) 25 MG tablet Take 25 mg by mouth daily.   Yes [provider]  torsemide (DEMADEX) 100 MG tablet Take 50-100 mg by mouth 2 (two) times daily. 100 mg am 50 mg pm 10/15/22  Yes [provider]  Vitamin D, Ergocalciferol, (DRISDOL) 1.25 MG (50000 UNIT) CAPS capsule Take 50,000 Units by mouth once a week. 11/09/20  Yes [provider]  zinc gluconate 50 MG tablet Take 50 mg by mouth daily.   Yes [provider]  Continuous Blood Gluc Sensor (FREESTYLE LIBRE 2 SENSOR) MISC Apply topically. 10/21/20   [provider]   Allergies  Allergen Reactions   Codeine     Rash and oral swelling    Coumadin [Warfarin] Anaphylaxis, Swelling and Rash   Dilantin [Phenytoin] Other (See Comments)    seizure    Review of Systems  Unable to perform ROS: Other    Physical Exam Vitals and nursing note reviewed.  Constitutional:      General: She is not in acute distress.    Appearance: She is obese. She is ill-appearing.  HENT:     Mouth/Throat:     Mouth: Mucous membranes are moist.  Cardiovascular:     Rate and Rhythm:  Normal rate.  Musculoskeletal:        General: Swelling present.  Skin:    General: Skin is warm and dry.     Comments: Wound plantar R foot as pictured  Neurological:     Mental Status: She is alert and oriented to person, place, and time.  Psychiatric:        Mood and Affect: Mood normal.        Behavior: Behavior normal.     Vital Signs: BP 120/75 (BP Location: Right Wrist)   Pulse 70   Temp 97.9 F (36.6 C) (Oral)   Resp 20   Ht 5\' 10"  (1.778 m)   Wt 134.7 kg   SpO2 99%   BMI 42.61 kg/m  Pain Scale: 0-10   Pain Score: 2    SpO2: SpO2: 99 % O2 Device:SpO2: 99 % O2 Flow Rate: .   IO: Intake/output summary:  Intake/Output Summary (Last 24 hours) at 10/17/2023 1239 Last data filed at 10/17/2023 0900 Gross per 24 hour  Intake 1020.27 ml  Output 200 ml  Net 820.27 ml    LBM: Last BM Date : 10/14/23 Baseline Weight: Weight: 128 kg Most recent weight: Weight: 134.7 kg     Palliative Assessment/Data:     Time In: 1230 Time Out: 1345 Time Total: 75 minutes  Greater than 50%  of this time was spent counseling and coordinating care related to the above assessment and plan.  Signed by: Katheran Awe,  NP   Please contact Palliative Medicine Team phone at [phone] for questions and concerns.  For individual provider: See Loretha Stapler

## 2023-10-18 ENCOUNTER — Encounter (HOSPITAL_COMMUNITY): Payer: Self-pay | Admitting: Hematology

## 2023-10-18 DIAGNOSIS — L0390 Cellulitis, unspecified: Secondary | ICD-10-CM

## 2023-10-18 DIAGNOSIS — A419 Sepsis, unspecified organism: Secondary | ICD-10-CM | POA: Diagnosis not present

## 2023-10-18 DIAGNOSIS — L97519 Non-pressure chronic ulcer of other part of right foot with unspecified severity: Secondary | ICD-10-CM | POA: Diagnosis not present

## 2023-10-18 DIAGNOSIS — M86171 Other acute osteomyelitis, right ankle and foot: Secondary | ICD-10-CM | POA: Diagnosis not present

## 2023-10-18 DIAGNOSIS — L03115 Cellulitis of right lower limb: Secondary | ICD-10-CM

## 2023-10-18 DIAGNOSIS — M869 Osteomyelitis, unspecified: Secondary | ICD-10-CM | POA: Insufficient documentation

## 2023-10-18 DIAGNOSIS — N2889 Other specified disorders of kidney and ureter: Secondary | ICD-10-CM | POA: Insufficient documentation

## 2023-10-18 LAB — CBC
HCT: 28.5 % — ABNORMAL LOW (ref 36.0–46.0)
Hemoglobin: 8.6 g/dL — ABNORMAL LOW (ref 12.0–15.0)
MCH: 24.9 pg — ABNORMAL LOW (ref 26.0–34.0)
MCHC: 30.2 g/dL (ref 30.0–36.0)
MCV: 82.6 fL (ref 80.0–100.0)
Platelets: 354 10*3/uL (ref 150–400)
RBC: 3.45 MIL/uL — ABNORMAL LOW (ref 3.87–5.11)
RDW: 14.6 % (ref 11.5–15.5)
WBC: 7 10*3/uL (ref 4.0–10.5)
nRBC: 0 % (ref 0.0–0.2)

## 2023-10-18 LAB — GLUCOSE, CAPILLARY
Glucose-Capillary: 200 mg/dL — ABNORMAL HIGH (ref 70–99)
Glucose-Capillary: 246 mg/dL — ABNORMAL HIGH (ref 70–99)
Glucose-Capillary: 249 mg/dL — ABNORMAL HIGH (ref 70–99)

## 2023-10-18 LAB — BASIC METABOLIC PANEL WITH GFR
Anion gap: 10 (ref 5–15)
BUN: 73 mg/dL — ABNORMAL HIGH (ref 8–23)
CO2: 28 mmol/L (ref 22–32)
Calcium: 8.1 mg/dL — ABNORMAL LOW (ref 8.9–10.3)
Chloride: 89 mmol/L — ABNORMAL LOW (ref 98–111)
Creatinine, Ser: 2.13 mg/dL — ABNORMAL HIGH (ref 0.44–1.00)
GFR, Estimated: 25 mL/min — ABNORMAL LOW (ref >60)
Glucose, Bld: 214 mg/dL — ABNORMAL HIGH (ref 70–99)
Potassium: 4.2 mmol/L (ref 3.5–5.1)
Sodium: 127 mmol/L — ABNORMAL LOW (ref 135–145)

## 2023-10-18 LAB — URINE CULTURE

## 2023-10-18 MED ORDER — LINEZOLID 600 MG/300ML IV SOLN: 600.0000 mg | Freq: Two times a day (BID) | INTRAVENOUS | Status: AC

## 2023-10-18 MED ORDER — HYDROCOD POLI-CHLORPHE POLI ER 10-8 MG/5ML PO SUER: 5.0000 mL | Freq: Two times a day (BID) | ORAL | Status: AC | PRN

## 2023-10-18 MED ORDER — INSULIN GLARGINE-YFGN 100 UNIT/ML ~~LOC~~ SOLN
12.0000 [IU] | Freq: Every day | SUBCUTANEOUS | Status: DC
  Administered 2023-10-18: 12 [IU] via SUBCUTANEOUS
  Filled 2023-10-18 (×2): qty 0.12

## 2023-10-18 MED ORDER — SODIUM CHLORIDE 0.9 % IV SOLN: 2.0000 g | INTRAVENOUS | Status: AC

## 2023-10-18 MED ORDER — ORAL CARE MOUTH RINSE: 15.0000 mL | OROMUCOSAL | Status: DC | PRN

## 2023-10-18 MED ORDER — MENTHOL 3 MG MT LOZG: 1.0000 | LOZENGE | OROMUCOSAL | 12 refills | Status: AC | PRN

## 2023-10-18 MED ORDER — ACETAMINOPHEN 325 MG PO TABS: 650.0000 mg | ORAL_TABLET | Freq: Four times a day (QID) | ORAL | Status: AC | PRN

## 2023-10-18 NOTE — Inpatient Diabetes Management (Signed)
 Inpatient Diabetes Program Recommendations  AACE/ADA: New Consensus Statement on Inpatient Glycemic Control (2015)  Target Ranges:  Prepandial:   less than 140 mg/dL      Peak postprandial:   less than 180 mg/dL (1-2 hours)      Critically ill patients:  140 - 180 mg/dL   Lab Results  Component Value Date   GLUCAP 200 (H) 10/18/2023   HGBA1C 7.4 (H) 10/16/2023    Review of Glycemic Control  Latest Reference Range & Units 10/17/23 07:40 10/17/23 11:41 10/17/23 16:35 10/17/23 21:09 10/18/23 07:48  Glucose-Capillary 70 - 99 mg/dL 782 (H) 956 (H) 213 (H) 214 (H) 200 (H)  (H): Data is abnormally high Diabetes history: Type 2 DM Outpatient Diabetes medications: Lantus 35 units every day, Humalog 5-10 units TID Current orders for Inpatient glycemic control: Novolog 0-9 units TID   Inpatient Diabetes Program Recommendations:    Consider adding Semglee 12 units every day.   Thanks, Lujean Rave, MSN, RNC-OB Diabetes Coordinator [phone] (8a-5p)

## 2023-10-18 NOTE — Progress Notes (Signed)
 Responded to nursing call:  eval for transfer to [facility]   Subjective: Pt denies f/c cp, sob, abd pain, n/v  Vitals:   10/17/23 1242 10/17/23 2026 10/18/23 0409 10/18/23 1300  BP: (!) 142/79 (!) 143/72 (!) 150/73 (!) 154/84  Pulse: 81 72 71 66  Resp: 14 20 20 19   Temp: (!) 100.6 F (38.1 C) (!) 97.5 F (36.4 C) 98 F (36.7 C) 97.8 F (36.6 C)  TempSrc: Oral Oral Oral Oral  SpO2: 91% 94% 96% 98%  Weight:      Height:       CV--RRR Lung--bibasilar crackles Abd--soft+BS/NT   Assessment/Plan:  Stable for transfer to [facility]    Catarina Hartshorn, DO [facility]

## 2023-10-18 NOTE — Care Management Important Message (Signed)
 Important Message  Patient Details  Name: Suzanne Tran MRN: [id number] Date of Birth: [birth year]   Important Message Given:  Yes - Medicare IM     Corey Harold 10/18/2023, 3:19 PM

## 2023-10-18 NOTE — Progress Notes (Addendum)
 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 She reports a couple of days of worsening swelling and shortness of breath, ultimately she has made the decision to call 911. [facility] was notified later in the evening with the therapist called to schedule her visit. She was admitted on 4.2 with a diagnosis of Sepsis. Per Dr. Patric Dykes with [facility] this is a related hospital admission.   Suzanne Tran reports that she does not have any pain and feels her swelling is improving. She still has a cough and is continuing to received IVAB. Determination has been made that patient has osteomyelitis due to proteus bacteremia and BKA amputation has been recommended, patient is however refusing and requesting second opinion. She has been accepted in transfer to [place].   Patient is inpatient appropriate with need for IVAB and treatment for acute illness.   Vital Signs- 97.8/66/19    154/84     spO2 98% on 1.5L nasal cannula      Intake/Output- not recorded Abnormal labs- Na+ 127, Chloride 89, BUN 73, Creatinine 2.13, Ca+ 8.1, GFR 25, RBC 3.45 Hgb 8.6, Hct 28.5, blood culture + for proteus mirabilis Diagnostics-  MRI OF THE RIGHT FOREFOOT WITHOUT CONTRAST  IMPRESSION: 1. Plantar wound at the midfoot with osteomyelitis of the underlying base and proximal half of the fourth and fifth metatarsals and partially visualized cuboid, at the level of fourth and fifth TMT joints. Areas of cortical irregularity and fragmentation at the base of fourth and fifth metatarsals could reflect a combination of neuropathic arthropathy and osteomyelitis. There is evidence of surrounding cellulitis. No loculated collection identified. 2. Mild marrow edema of the near completely ankylosed third TMT joint could reflect chronic degenerative  changes, however, osteomyelitis can not be excluded.   Electronically Signed   By: Hart Robinsons M.D.   On: 10/17/2023 20:56      IV/PRN Meds- Rocephin 2g IV q24H, LR @100ml /H continuous, Linezolid 600mg  IV q12 H, Tussionex 5ml PO q12H prn x2  Problem List as per PN Dr. 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    MRI showed ulcer and fluid collection tracking to the base of the metatarsals, with likely osteomeyelitis of the 4th and 5th proximal metatarsals, cuboid and probably also 3rd TMT.     Discussed with Orthopedics, BKA recommended.  Palliative care involved.  Discussed with patient, who refused amputation.   ID were consulted, who agreed BKA provided more chance of definitive cure as well as mechanical advantage, but that debridement of abscess and prolonged antibiotics could be offered if patient truly refused amputation.   No podiatry available at [facility], but discussed with Podiatry team at [facility], and patient ultimately accepted for transfer to [facility] for Podiatry and ID evaluation   - Continue Rocephin and doxycycline - Consult Podiatry and ID   Discharge Planning- Ongoing back home vs skilled facility pending progress, currently transferring to [facility]- None, patient is her own spokesperson IDT- Updated Goals of care - Full code Suzanne Gist, RN, [facility] Liaison [phone]

## 2023-10-18 NOTE — Plan of Care (Signed)
  Problem: Acute Rehab OT Goals (only OT should resolve) Goal: Pt. Will Perform Grooming Flowsheets (Taken 10/18/2023 1203) Pt Will Perform Grooming:  with modified independence  sitting Goal: Pt. Will Perform Lower Body Bathing Flowsheets (Taken 10/18/2023 1203) Pt Will Perform Lower Body Bathing:  with contact guard assist  sitting/lateral leans Goal: Pt. Will Perform Lower Body Dressing Flowsheets (Taken 10/18/2023 1203) Pt Will Perform Lower Body Dressing:  with contact guard assist  sitting/lateral leans Goal: Pt. Will Transfer To Toilet Flowsheets (Taken 10/18/2023 1203) Pt Will Transfer to Toilet:  with min assist  stand pivot transfer  with mod assist Goal: Pt. Will Perform Toileting-Clothing Manipulation Flowsheets (Taken 10/18/2023 1203) Pt Will Perform Toileting - Clothing Manipulation and hygiene:  with min assist  with contact guard assist  sitting/lateral leans Goal: Pt/Caregiver Will Perform Home Exercise Program Flowsheets (Taken 10/18/2023 1203) Pt/caregiver will Perform Home Exercise Program:  Increased strength  Both right and left upper extremity  Independently  Latavius Capizzi OT, MOT

## 2023-10-18 NOTE — Progress Notes (Signed)
   10/18/23 1050  Spiritual Encounters  Type of Visit Initial  Care provided to: Patient  Conversation partners present during encounter Nurse  Referral source Other (comment) (Spiritual Consult)  Reason for visit Urgent spiritual support  OnCall Visit No  Spiritual Framework  Presenting Themes Significant life change  Values/beliefs Strong belief in God and sharing her testimony.  Community/Connection Family  Strengths Her foundation is in God,  Needs/Challenges/Barriers More clarification about what needs to happen for her health situation and why. Are there any options.   Chaplain met with the patient, Suzanne Tran, who was alert and clear minded. She shared that she had been waiting for me. Suzanne Tran shared her life review with me as I listened it was clear she has led a active life and very much to God's glory. Today though she was struggling with the information she received from the doctor. Suzanne Tran considered some steps she could take during our conversation but what stood out was more information was needed for her to move forward.  I encouraged Suzanne Tran to ask for the doctor to readdress the situation or someone else if the doctor was not available.  Then she can make a more informed decision with the support of her brother.   Valerie Roys [facility]  [phone]

## 2023-10-18 NOTE — Discharge Summary (Signed)
 Physician Discharge Summary   Patient: Suzanne Tran MRN: [id number] DOB: Jan 07, 1956  Admit date:     10/16/2023  Discharge date: 10/18/23  Discharge Physician: Alberteen Sam   PCP: Benita Stabile, MD     Recommendations at discharge:  Transferred to [facility] for Podiatry evaluation      Discharge Diagnoses: Principal Problem:   Sepsis due to osteomyelitis of the right metatarsal and tarsal bones [facility]) Active Problems:   Ulcer of right foot (HCC)   Essential hypertension   OSA (obstructive sleep apnea)   Normocytic anemia   Chronic diastolic heart failure (HCC)   Pressure injury of skin   Type 2 diabetes mellitus with hyperglycemia (HCC)   Obesity, Class III, BMI 40-49.9 (morbid obesity) (HCC)   Hyponatremia   Bacteremia   CKD (chronic kidney disease), stage IV (HCC)   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 F with dCHF, HTN, CKD IV baseline 1.9, renal mass, obesity, OSA and right Charcot foot who presented with fever and right foot swelling.    Had been okay until a few days prior to admission, started having redness, swelling of the right foot, then fevers.    In the ER, febrile and WBC 12K.  Started on antibiotics and admitted.    Blood cultures subsequently growing GNRs.  MRI subsequently showing abscess tracking to osteomyelitis of the metatarsal bases and cuboid.       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   MRI showed ulcer and fluid collection tracking to the base of the metatarsals, with likely osteomeyelitis of the 4th and 5th proximal metatarsals, cuboid and probably also 3rd TMT.    Discussed with Orthopedics, BKA recommended.  Palliative care involved.  Discussed with  patient, who refused amputation.  ID were consulted, who agreed BKA provided more chance of definitive cure as well as mechanical advantage, but that debridement of abscess and prolonged antibiotics could be offered if patient truly refused amputation.  No podiatry available at [facility], but discussed with Podiatry team at [facility], and patient ultimately accepted for transfer to [facility] for Podiatry and ID evaluation  - Continue Rocephin and doxycycline - Consult Podiatry and ID      Goals of care Previously enrolled in Hospice for qualifying diagnosis of "renal failure and hypertensive heart disease".  In my medical opinion, it is unclear the patient qualifies for hospice, although I would agree her overall functional status is poor and prognosis is probably also limited.   CKD (chronic kidney disease), stage IV (HCC) AKI doubted.  Last Cr in our system was 1.9  and that was >1 year ago.  Cr here is 2.1-2.3 and stable off diuretics.     Chronic diastolic heart failure (HCC) Hypertension EF in 2023 was 55-60%, normal valves.  Here, she appeared dehydrated on admission and BP was soft so diuretics held and she was given fluids.    This appears to be improving.   On torsemide 100 in AM and 50 in PM, spironolactone 25 daily. - Check BMP - Resume torsemide and spironolactone soon  - Continue carvedilol  Renal mass This is lonstanding since 2021.  Slowly growing, but being followed with surveillance by Urology (I think due to poor renal function).  Hyponatremia Na down to 127, asymptomatic. - Hold fluids  Obesity, Class III, BMI 40-49.9 (morbid obesity) (HCC) BMI 42  Type 2 diabetes mellitus with hyperglycemia (HCC) Glucose elevated - Resume home Lantus - Continue SS corrections  Pressure injury of skin Sacrum stage I, POA The heel wound is a diabetic ulcer, not pressure ulcer  Normocytic anemia Hgb stably low due to infection.  No bleeding observed or reported.  OSA  (obstructive sleep apnea) - CPAP at night            [facility] Controlled Substances Registry was reviewed for this patient prior to discharge.   Consultants: Infectious Disease Palliative Care Orthopedics  Procedures performed: MRI right foot  Disposition: Transferred to [facility] Diet recommendation:  Cardiac and Carb modified diet  DISCHARGE MEDICATION: Allergies as of 10/18/2023       Reactions   Codeine    Rash and oral swelling   Coumadin [warfarin] Anaphylaxis, Swelling, Rash   Dilantin [phenytoin] Other (See Comments)   seizure        Medication List     PAUSE taking these medications    spironolactone 25 MG tablet Wait to take this until your doctor or other care provider tells you to start again. Commonly known as: ALDACTONE Take 25 mg by mouth daily.   torsemide 100 MG tablet Wait to take this until your doctor or other care provider tells you to start again. Commonly known as: DEMADEX Take 50-100 mg by mouth 2 (two) times daily. 100 mg am 50 mg pm       STOP taking these medications    fluconazole 150 MG tablet Commonly known as: DIFLUCAN       TAKE these medications    acetaminophen 325 MG tablet Commonly known as: TYLENOL Take 2 tablets (650 mg total) by mouth every 6 (six) hours as needed for mild pain (pain score 1-3) (or Fever >/= 101).   ascorbic acid 500 MG tablet Commonly known as: VITAMIN C Vitamin C 500 mg tablet  Take 1 tablet by oral route.   aspirin EC 81 MG tablet Take 1 tablet (81 mg total) by mouth daily. Swallow whole.   benzonatate 100 MG capsule Commonly known as: TESSALON Take 100 mg by mouth 3 (three) times daily as needed for cough.   carvedilol 3.125 MG tablet Commonly known as: COREG Take 1 tablet (3.125 mg total) by mouth 2 (two) times daily with a meal.   cefTRIAXone 2 g in sodium chloride 0.9 % 100 mL Inject 2 g into the vein daily. Start taking on: October 19, 2023    chlorpheniramine-HYDROcodone 10-8 MG/5ML Commonly known as: TUSSIONEX Take 5 mLs by mouth every 12 (twelve) hours as needed (Refractory cough).   FreeStyle Libre 2 Sensor Misc Apply topically.   insulin lispro 100 UNIT/ML KwikPen Commonly known as: HUMALOG Inject 5-10 Units into the skin 3 (three) times daily. Sliding scale   ipratropium-albuterol 0.5-2.5 (3) MG/3ML Soln Commonly known as: DUONEB Take 3 mLs by nebulization every 6 (six) hours as needed (wheezing, shortness of breath).   Lantus SoloStar 100 UNIT/ML Solostar Pen Generic drug: insulin glargine Inject 35 Units into the skin daily.   linezolid 600 MG/300ML IVPB Commonly known as: ZYVOX Inject 300 mLs (600 mg total) into the vein every 12 (twelve) hours.   menthol-cetylpyridinium 3 MG lozenge Commonly known as: CEPACOL Take 1 lozenge (3 mg total) by mouth as needed for sore throat.   Misc Intestinal Flora Regulat Caps Take  1 capsule by mouth daily.   multivitamin tablet Take 1 tablet by mouth daily.   TheraTears Extra 0.25 % Soln Generic drug: Carboxymethylcellulose Sodium Apply 1-2 drops to eye daily as needed (dry eyes).   Vitamin B-Complex Tabs Take by mouth.   Vitamin D (Ergocalciferol) 1.25 MG (50000 UNIT) Caps capsule Commonly known as: DRISDOL Take 50,000 Units by mouth once a week.   zinc gluconate 50 MG tablet Take 50 mg by mouth daily.         Discharge Instructions     No wound care   Complete by: As directed        Discharge Exam: Filed Weights   10/16/23 1618 10/16/23 2047  Weight: 128 kg 134.7 kg    General: Pt is alert, awake, not in acute distress, crying Cardiovascular: RRR, nl S1-S2, no murmurs appreciated.   No pitting LE edema.   MSK:  Respiratory: Normal respiratory rate and rhythm.  CTAB without rales or wheezes. Abdominal: Abdomen soft and non-tender.  No distension or HSM.   Neuro/Psych: Strength symmetric in upper and lower extremities.  Judgment and insight  appear normal.   Condition at discharge: stable  The results of significant diagnostics from this hospitalization (including imaging, microbiology, ancillary and laboratory) are listed below for reference.   Imaging Studies: MR FOOT RIGHT WO CONTRAST Result Date: 10/17/2023 CLINICAL DATA:  History of Charcot foot. Wound on the plantar surface of the right foot. EXAM: MRI OF THE RIGHT FOREFOOT WITHOUT CONTRAST TECHNIQUE: Multiplanar, multisequence MR imaging of the right foot. Was performed. No intravenous contrast was administered. COMPARISON:  Right foot radiographs dated 10/16/2023. FINDINGS: Bones/Joint/Cartilage Plantar wound at the midfoot tracking deep to the level of the fourth and fifth TMT joints. There is T2/STIR hyperintense marrow signal abnormality with corresponding confluent T1 hypointensity of the fourth and fifth TMT joints involving the base and proximal half of the fourth and fifth metatarsals as well as the partially visualized cuboid, most compatible with osteomyelitis. Less pronounced mild marrow edema of the near completely ankylosed third TMT joint. There is complete ankylosis of the second TMT joint. Cortical irregularity with areas of fragmentation at the base of the fourth and fifth metatarsals, which could reflect combination of neuropathic arthropathy and osteomyelitis. The remainder of the visualized bones demonstrate normal marrow signal. No significant joint effusion. Ligaments Lisfranc ligament is intact. Muscles and Tendons Flexor and extensor compartment tendons are intact. Diffuse atrophy of the intrinsic musculature of the foot, likely secondary to chronic denervation changes. Soft tissue Plantar wound at the midfoot with surrounding soft tissue edema and cutaneous thickening. Diffuse subcutaneous edema, most pronounced along the dorsal foot. No discrete loculated collection identified. IMPRESSION: 1. Plantar wound at the midfoot with osteomyelitis of the underlying base  and proximal half of the fourth and fifth metatarsals and partially visualized cuboid, at the level of fourth and fifth TMT joints. Areas of cortical irregularity and fragmentation at the base of fourth and fifth metatarsals could reflect a combination of neuropathic arthropathy and osteomyelitis. There is evidence of surrounding cellulitis. No loculated collection identified. 2. Mild marrow edema of the near completely ankylosed third TMT joint could reflect chronic degenerative changes, however, osteomyelitis can not be excluded. Electronically Signed   By: Hart Robinsons M.D.   On: 10/17/2023 20:56   DG Foot Complete Right Result Date: 10/16/2023 CLINICAL DATA:  Right foot ulcer, infection EXAM: RIGHT FOOT COMPLETE - 3+ VIEW COMPARISON:  None Available. FINDINGS: Advanced degenerative changes in the midfoot.  Fusion across the tarsal bones and tarsal metatarsal joints. Diffuse osteopenia. Pes planus deformity. No acute fracture, subluxation or dislocation. No bone destruction to suggest osteomyelitis. Soft tissue ulcer noted along the plantar surface. No soft tissue gas or radiopaque foreign body. Diffuse soft tissue swelling. IMPRESSION: No acute bony abnormality. Advanced degenerative changes. Osteopenia. Electronically Signed   By: Charlett Nose M.D.   On: 10/16/2023 19:31   DG Chest 2 View Result Date: 10/16/2023 CLINICAL DATA:  Shortness of breath EXAM: CHEST - 2 VIEW COMPARISON:  Chest x-ray 06/18/2022 FINDINGS: The heart is enlarged. There central pulmonary vascular congestion. There is no focal lung infiltrate, pleural effusion or pneumothorax. There are 2 nodular densities in the left suprahilar region. One appears vascular in the other is indeterminate measuring up to 16 mm. No acute fracture identified. IMPRESSION: 1. Cardiomegaly with central pulmonary vascular congestion. 2. There are 2 nodular densities in the left suprahilar region. One appears vascular in the other is indeterminate measuring  up to 16 mm. Recommend further evaluation with CT. Electronically Signed   By: Darliss Cheney M.D.   On: 10/16/2023 18:17    Microbiology: Results for orders placed or performed during the hospital encounter of 10/16/23  Resp panel by RT-PCR (RSV, Flu A&B, Covid) Anterior Nasal Swab     Status: None   Collection Time: 10/16/23  4:19 PM   Specimen: Anterior Nasal Swab  Result Value Ref Range Status   SARS Coronavirus 2 by RT PCR NEGATIVE NEGATIVE Final    Comment: (NOTE) SARS-CoV-2 target nucleic acids are NOT DETECTED.  The SARS-CoV-2 RNA is generally detectable in upper respiratory specimens during the acute phase of infection. The lowest concentration of SARS-CoV-2 viral copies this assay can detect is 138 copies/mL. A negative result does not preclude SARS-Cov-2 infection and should not be used as the sole basis for treatment or other patient management decisions. A negative result may occur with  improper specimen collection/handling, submission of specimen other than nasopharyngeal swab, presence of viral mutation(s) within the areas targeted by this assay, and inadequate number of viral copies(<138 copies/mL). A negative result must be combined with clinical observations, patient history, and epidemiological information. The expected result is Negative.  Fact Sheet for Patients:  BloggerCourse.com  Fact Sheet for Healthcare Providers:  SeriousBroker.it  This test is no t yet approved or cleared by the [place] FDA and  has been authorized for detection and/or diagnosis of SARS-CoV-2 by FDA under an Emergency Use Authorization (EUA). This EUA will remain  in effect (meaning this test can be used) for the duration of the COVID-19 declaration under Section 564(b)(1) of the Act, 21 U.S.C.section 360bbb-3(b)(1), unless the authorization is terminated  or revoked sooner.       Influenza A by PCR NEGATIVE NEGATIVE Final    Influenza B by PCR NEGATIVE NEGATIVE Final    Comment: (NOTE) The Xpert Xpress SARS-CoV-2/FLU/RSV plus assay is intended as an aid in the diagnosis of influenza from Nasopharyngeal swab specimens and should not be used as a sole basis for treatment. Nasal washings and aspirates are unacceptable for Xpert Xpress SARS-CoV-2/FLU/RSV testing.  Fact Sheet for Patients: BloggerCourse.com  Fact Sheet for Healthcare Providers: SeriousBroker.it  This test is not yet approved or cleared by the [place] FDA and has been authorized for detection and/or diagnosis of SARS-CoV-2 by FDA under an Emergency Use Authorization (EUA). This EUA will remain in effect (meaning this test can be used) for the duration of the COVID-19 declaration  under Section 564(b)(1) of the Act, 21 U.S.C. section 360bbb-3(b)(1), unless the authorization is terminated or revoked.     Resp Syncytial Virus by PCR NEGATIVE NEGATIVE Final    Comment: (NOTE) Fact Sheet for Patients: BloggerCourse.com  Fact Sheet for Healthcare Providers: SeriousBroker.it  This test is not yet approved or cleared by the [place] FDA and has been authorized for detection and/or diagnosis of SARS-CoV-2 by FDA under an Emergency Use Authorization (EUA). This EUA will remain in effect (meaning this test can be used) for the duration of the COVID-19 declaration under Section 564(b)(1) of the Act, 21 U.S.C. section 360bbb-3(b)(1), unless the authorization is terminated or revoked.  Performed at [facility], [address] [place], [place] [address]   Blood culture (routine x 2)     Status: Abnormal (Preliminary result)   Collection Time: 10/16/23  5:34 PM   Specimen: BLOOD  Result Value Ref Range Status   Specimen Description   Final    BLOOD RIGHT ANTECUBITAL Performed at [facility], [address], [place], [place] [address]     Special Requests   Final    BOTTLES DRAWN AEROBIC AND ANAEROBIC Blood Culture adequate volume Performed at [facility], [address] [place], [place] [address]    Culture  Setup Time   Final    IN BOTH AEROBIC AND ANAEROBIC BOTTLES GRAM NEGATIVE RODS Gram Stain Report Called to,Read Back By and Verified With: MICHELLE CATES LAND AT 1136 10/17/2023 BY A. SNYDER CRITICAL RESULT CALLED TO, READ BACK BY AND VERIFIED WITH: RN MICHELLE ON 10/17/23 @ 1741 BY DRT    Culture (A)  Final    PROTEUS MIRABILIS SUSCEPTIBILITIES TO FOLLOW Performed at [facility], [address], [place], [place] [address]    Report Status PENDING  Incomplete  Blood culture (routine x 2)     Status: Abnormal (Preliminary result)   Collection Time: 10/16/23  5:34 PM   Specimen: BLOOD  Result Value Ref Range Status   Specimen Description   Final    BLOOD LEFT ANTECUBITAL Performed at [facility], [address], [place], [place] [address]    Special Requests   Final    BOTTLES DRAWN AEROBIC AND ANAEROBIC Blood Culture adequate volume Performed at [facility], [address], [place], [place] [address]    Culture  Setup Time   Final    IN BOTH AEROBIC AND ANAEROBIC BOTTLES GRAM NEGATIVE RODS CRITICAL VALUE NOTED.  VALUE IS CONSISTENT WITH PREVIOUSLY REPORTED AND CALLED VALUE. Performed at [facility], [address], [place], [place] [address]    Culture PROTEUS MIRABILIS (A)  Final   Report Status PENDING  Incomplete  Blood Culture ID Panel (Reflexed)     Status: Abnormal   Collection Time: 10/16/23  5:34 PM  Result Value Ref Range Status   Enterococcus faecalis NOT DETECTED NOT DETECTED Final   Enterococcus Faecium NOT DETECTED NOT DETECTED Final   Listeria monocytogenes NOT DETECTED NOT DETECTED Final   Staphylococcus species NOT DETECTED NOT DETECTED Final   Staphylococcus aureus (BCID) NOT DETECTED NOT DETECTED Final   Staphylococcus epidermidis NOT DETECTED NOT DETECTED Final   Staphylococcus  lugdunensis NOT DETECTED NOT DETECTED Final   Streptococcus species NOT DETECTED NOT DETECTED Final   Streptococcus agalactiae NOT DETECTED NOT DETECTED Final   Streptococcus pneumoniae NOT DETECTED NOT DETECTED Final   Streptococcus pyogenes NOT DETECTED NOT DETECTED Final   A.calcoaceticus-baumannii NOT DETECTED NOT DETECTED Final   Bacteroides fragilis NOT DETECTED  NOT DETECTED Final   Enterobacterales DETECTED (A) NOT DETECTED Final    Comment: Enterobacterales represent a large order of gram negative bacteria, not a single organism. CRITICAL RESULT CALLED TO, READ BACK BY AND VERIFIED WITH: RN MICHELLE ON 10/17/23 @ 1741 BY DRT    Enterobacter cloacae complex NOT DETECTED NOT DETECTED Final   Escherichia coli NOT DETECTED NOT DETECTED Final   Klebsiella aerogenes NOT DETECTED NOT DETECTED Final   Klebsiella oxytoca NOT DETECTED NOT DETECTED Final   Klebsiella pneumoniae NOT DETECTED NOT DETECTED Final   Proteus species DETECTED (A) NOT DETECTED Final    Comment: CRITICAL RESULT CALLED TO, READ BACK BY AND VERIFIED WITH: RN MICHELLE ON 10/17/23 @ 1741 BY DRT    Salmonella species NOT DETECTED NOT DETECTED Final   Serratia marcescens NOT DETECTED NOT DETECTED Final   Haemophilus influenzae NOT DETECTED NOT DETECTED Final   Neisseria meningitidis NOT DETECTED NOT DETECTED Final   Pseudomonas aeruginosa NOT DETECTED NOT DETECTED Final   Stenotrophomonas maltophilia NOT DETECTED NOT DETECTED Final   Candida albicans NOT DETECTED NOT DETECTED Final   Candida auris NOT DETECTED NOT DETECTED Final   Candida glabrata NOT DETECTED NOT DETECTED Final   Candida krusei NOT DETECTED NOT DETECTED Final   Candida parapsilosis NOT DETECTED NOT DETECTED Final   Candida tropicalis NOT DETECTED NOT DETECTED Final   Cryptococcus neoformans/gattii NOT DETECTED NOT DETECTED Final   CTX-M ESBL NOT DETECTED NOT DETECTED Final   Carbapenem resistance IMP NOT DETECTED NOT DETECTED Final   Carbapenem  resistance KPC NOT DETECTED NOT DETECTED Final   Carbapenem resistance NDM NOT DETECTED NOT DETECTED Final   Carbapenem resist OXA 48 LIKE NOT DETECTED NOT DETECTED Final   Carbapenem resistance VIM NOT DETECTED NOT DETECTED Final    Comment: Performed at [facility], [address] [place], [place] [address]  Urine Culture     Status: Abnormal   Collection Time: 10/17/23  6:30 AM   Specimen: Urine, Random  Result Value Ref Range Status   Specimen Description   Final    URINE, RANDOM Performed at [facility], [address], [place], [place] [address]    Special Requests   Final    NONE Reflexed from [id number] Performed at [facility], [address] [place], [place] [address]    Culture MULTIPLE SPECIES PRESENT, SUGGEST RECOLLECTION (A)  Final   Report Status 10/18/2023 FINAL  Final    Labs: CBC: Recent Labs  Lab 10/16/23 1638 10/17/23 0436 10/18/23 0427  WBC 12.4* 9.1 7.0  NEUTROABS 11.3*  --   --   HGB 9.3* 8.2* 8.6*  HCT 30.0* 26.4* 28.5*  MCV 82.2 82.2 82.6  PLT 420* 280 354   Basic Metabolic Panel: Recent Labs  Lab 10/16/23 1638 10/17/23 0436 10/18/23 0427  NA 130* 130* 127*  K 4.4 4.8 4.2  CL 88* 91* 89*  CO2 27 26 28   GLUCOSE 145* 193* 214*  BUN 74* 75* 73*  CREATININE 2.27* 2.30* 2.13*  CALCIUM 8.5* 8.0* 8.1*  MG  --  2.4  --   PHOS  --  3.9  --    Liver Function Tests: Recent Labs  Lab 10/17/23 0436  AST 20  ALT 8  ALKPHOS 45  BILITOT 1.1  PROT 6.5  ALBUMIN 2.6*   CBG: Recent Labs  Lab 10/17/23 1141 10/17/23 1635 10/17/23 2109 10/18/23 0748 10/18/23 1117  GLUCAP 188* 185* 214* 200* 246*    Discharge time spent: approximately  45 minutes spent on discharge counseling, evaluation of patient on day of discharge, and coordination of discharge planning with nursing, social work, pharmacy and case management  Signed: Alberteen Sam, MD [facility] 10/18/2023

## 2023-10-18 NOTE — Evaluation (Signed)
 Occupational Therapy Evaluation Patient Details Name: Suzanne Tran MRN: [id number] DOB: 1956/02/18 Today's Date: 10/18/2023   History of Present Illness   Suzanne Tran is a 68 y.o. female with medical history significant of T2DM, hypertension, CKD stage 3, hypertension, right Charcot's foot, OSA, CHF who presents to the emergency department due to several weeks of bilateral leg swelling which has progressively worsened.  She has a wound on the plantar surface of right foot which home health has been dressing.  Last night, she developed fever of 101F and noted some redness around the right foot wound with extension to right lower leg.  Home health nurse came out to the and noted that the wound was no longer draining, but was still advised to go to the ED due to the redness of the leg and the fever.  Patient also complained of 1 day of burning sensation on urination, obesity called in Cipro, but she just filled the prescription today and has not taken any of the medication.  She endorsed recent UTI in March as well as yeast infection which was treated with antibiotics at that time. (per DO)     Clinical Impressions Pt agreeable to OT and PT co-evaluation. Pt reported that she last stood on Wednesday. Pt has been needing help for ADL's from hospice staff since March. Pt required mod to max A for bed mobility with very slow labored movement. Pt became very dizzy once seated but this improved. Pt also became dizzy when returning to supine. Standing attempted without success with pt reporting she could not do it. Total assist for lower body ADL's at this time. Pt left in the bed with call bell within reach. Pt will benefit from continued OT in the hospital and recommended venue below to increase strength, balance, and endurance for safe ADL's.        If plan is discharge home, recommend the following:   Two people to help with walking and/or transfers;A lot of help with  bathing/dressing/bathroom;Assistance with cooking/housework;Assist for transportation;Help with stairs or ramp for entrance     Functional Status Assessment   Patient has had a recent decline in their functional status and demonstrates the ability to make significant improvements in function in a reasonable and predictable amount of time.     Equipment Recommendations   None recommended by OT             Precautions/Restrictions   Precautions Precautions: Fall Recall of Precautions/Restrictions: Intact Restrictions Weight Bearing Restrictions Per Provider Order: No     Mobility Bed Mobility Overal bed mobility: Needs Assistance Bed Mobility: Supine to Sit, Sit to Supine     Supine to sit: Mod assist, Max assist, HOB elevated Sit to supine: Max assist, Total assist   General bed mobility comments: slow labored movement; much assist to come to sit with HOB elevated.    Transfers                   General transfer comment: Briefly attempted sit to stand with RW but pt reported she could not stand.      Balance Overall balance assessment: Needs assistance Sitting-balance support: No upper extremity supported, Feet supported Sitting balance-Leahy Scale: Poor Sitting balance - Comments: poor to fair without UE support                                   ADL either performed or assessed  with clinical judgement   ADL Overall ADL's : Needs assistance/impaired Eating/Feeding: Set up;Bed level   Grooming: Set up;Contact guard assist;Sitting   Upper Body Bathing: Contact guard assist;Set up;Sitting   Lower Body Bathing: Total assistance;Sitting/lateral leans   Upper Body Dressing : Set up;Sitting;Contact guard assist   Lower Body Dressing: Total assistance;Sitting/lateral leans Lower Body Dressing Details (indicate cue type and reason): Assist to don R shoe seated at EOB Toilet Transfer: +2 for physical assistance;Total assistance Toilet  Transfer Details (indicate cue type and reason): Pt reported she was unable to stand when briefly attempted from EOB. Toileting- Clothing Manipulation and Hygiene: Maximal assistance;Sitting/lateral lean;Bed level;Total assistance               Vision Baseline Vision/History: 0 No visual deficits Ability to See in Adequate Light: 0 Adequate Patient Visual Report: No change from baseline Vision Assessment?: No apparent visual deficits     Perception Perception: Not tested       Praxis Praxis: Not tested       Pertinent Vitals/Pain Pain Assessment Pain Assessment: No/denies pain     Extremity/Trunk Assessment Upper Extremity Assessment Upper Extremity Assessment: Generalized weakness   Lower Extremity Assessment Lower Extremity Assessment: Defer to PT evaluation       Communication Communication Communication: No apparent difficulties   Cognition Arousal: Alert Behavior During Therapy: WFL for tasks assessed/performed Cognition: No apparent impairments                               Following commands: Intact       Cueing  General Comments   Cueing Techniques: Verbal cues                 Home Living Family/patient expects to be discharged to:: Private residence Living Arrangements: Alone;Other (Comment) Available Help at Discharge: Other (Comment);Family (hospice has been coming to the house 3 to 4 days a week) Type of Home: Apartment Home Access: Level entry     Home Layout: One level     Bathroom Shower/Tub: Producer, television/film/video: Standard Bathroom Accessibility: Yes How Accessible: Accessible via wheelchair;Accessible via walker Home Equipment: Rolling Walker (2 wheels);Rollator (4 wheels);BSC/3in1;Hospital bed;Shower seat;Grab bars - tub/shower;Grab bars - toilet   Additional Comments: sleeps in recliner      Prior Functioning/Environment Prior Level of Function : Needs assist       Physical Assist : ADLs  (physical)     Mobility Comments: Pt reported that prior to March she was ambulating in the house with the rollator. ADLs Comments: Until March the pt reported independence for ADL's. Since then she has needed help with bathing, dressing, and toileting.    OT Problem List: Decreased strength;Decreased activity tolerance;Impaired balance (sitting and/or standing);Obesity   OT Treatment/Interventions: Self-care/ADL training;Therapeutic exercise;DME and/or AE instruction;Energy conservation;Therapeutic activities;Patient/family education;Balance training      OT Goals(Current goals can be found in the care plan section)   Acute Rehab OT Goals Patient Stated Goal: improve function OT Goal Formulation: With patient Time For Goal Achievement: 11/01/23 Potential to Achieve Goals: Good   OT Frequency:  Min 2X/week    Co-evaluation PT/OT/SLP Co-Evaluation/Treatment: Yes Reason for Co-Treatment: To address functional/ADL transfers   OT goals addressed during session: ADL's and self-care                       End of Session Equipment Utilized During Treatment: Rolling walker (  2 wheels);Gait belt;Oxygen  Activity Tolerance: Patient tolerated treatment well Patient left: in bed;with call bell/phone within reach  OT Visit Diagnosis: Unsteadiness on feet (R26.81);Other abnormalities of gait and mobility (R26.89);Muscle weakness (generalized) (M62.81)                Time: 4403-4742 OT Time Calculation (min): 29 min Charges:  OT General Charges $OT Visit: 1 Visit OT Evaluation $OT Eval Low Complexity: 1 Low  Aroldo Galli OT, MOT   Danie Chandler 10/18/2023, 11:57 AM

## 2023-10-18 NOTE — Assessment & Plan Note (Signed)
 3cm mass on right kidney, in surveillance by Urology.

## 2023-10-18 NOTE — Plan of Care (Signed)
  Problem: Acute Rehab PT Goals(only PT should resolve) Goal: Pt will Roll Supine to Side Outcome: Progressing Flowsheets (Taken 10/18/2023 1430) Pt will Roll Supine to Side: with min assist Goal: Pt Will Go Supine/Side To Sit Outcome: Progressing Flowsheets (Taken 10/18/2023 1430) Pt will go Supine/Side to Sit: with minimal assist Goal: Patient Will Perform Sitting Balance Outcome: Progressing Flowsheets (Taken 10/18/2023 1430) Patient will perform sitting balance: with minimal assist Goal: Patient Will Transfer Sit To/From Stand Outcome: Progressing Flowsheets (Taken 10/18/2023 1430) Patient will transfer sit to/from stand: with moderate assist Goal: Pt Will Transfer Bed To Chair/Chair To Bed Outcome: Progressing Flowsheets (Taken 10/18/2023 1430) Pt will Transfer Bed to Chair/Chair to Bed: with mod assist   2:31 PM, 10/18/23 Chryl Heck, PT, DPT  with [facility]

## 2023-10-18 NOTE — Consult Note (Signed)
 [facility]    Date of Admission:  10/16/2023     Reason for Consult: bacteremia, diabetic foot infection    Referring Provider: Maryfrances Bunnell     Lines:  Peripheral iv's  Abx: 4/2-c Linezolid 4/2-c ceftriaxone        Assessment: 68 yo female with dm2, obesity, ckd4 admitted 4/2 with sepsis in settin gof chorinc right plantar mid foot ulcer of several weeks at the least and cellulitis change found to have bacteremia along with foot abscess tracking to the tarsal area  4/2 bcx proteus species (susceptibility pending) Mri shows rather proximal tracking abscess  Dr Lajoyce Corners had seen and offered bka. However patient would like second opinion  From my review and understanding, given her chronic sx likely surgical involvement is needed to optimize chance of cure. And given the rather proximal nature of infection, even in calculating for potential charcot changes, this would need at least a chopart amputation which would be in her disadvantage compared to BKA   I stress that dm management, vascular optimization, and surgical debridement would be paramount, as abx adds little benefit by itself alone    Plan: Would consider getting podiatry to comment on what they can offer if anything at all I agree that if amputation for source control would be best and bka would offer more mechanical advantage and more definitive cure chance Continue empiric antibiotics for now and adjust to oral doxycycline and continue iv ceftriaxone The proteus bacteremia can be treated for total 7-10 days Antibiotics length dependent on surgical intervention If patient refuse any kind of surgical intervention, it is always possible to try 4 weeks of oral antibiotics (oral vs iv nothing superior) -- > doxy/augmentin if the proteus is suceptible to augmentin Maintain standard isolation precaution Discussed with primary team       ------------------------------------------------ Principal Problem:   Sepsis due to osteomyelitis [facility]) Active Problems:   Ulcer of right foot (HCC)   Essential hypertension   OSA (obstructive sleep apnea)   Normocytic anemia   Chronic diastolic heart failure (HCC)   Pressure injury of skin   Type 2 diabetes mellitus with hyperglycemia (HCC)   Obesity, Class III, BMI 40-49.9 (morbid obesity) (HCC)   Hyponatremia   Bacteremia   CKD (chronic kidney disease), stage IV (HCC)   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 female with dm2, obesity, ckd4 admitted 4/2 with sepsis in settin gof chorinc right plantar mid foot ulcer of several weeks at the least and cellulitis change found to have bacteremia along with foot abscess tracking to the tarsal area  Patient has several weeks bilateral r>l leg swelling She has a few days of feeling unwell with worsening pain/redness of the right foot and fever  On presentation Febrile 103s Mild leukocytosis 12 Bcx proteus Mri foot showed abscess tracking to tarsal area right foot and om changes to bases of metatarsal  Dr Lajoyce Corners had offered amputation but she would like second opinion  Her sepsis had resolved on linezolid/ceftriaxone  Diabetes moderately well controlled Lab Results  Component Value Date   HGBA1C 7.4 (H) 10/16/2023      Family History  Problem Relation Age of Onset   Diabetes Mellitus II Mother    Cancer Mother    Hypertension Father    CAD Father    Diabetes Mellitus II Father    Diabetes Sister  Diabetes Brother     Social History   Tobacco Use   Smoking status: Never   Smokeless tobacco: Never  Vaping Use   Vaping status: Never Used  Substance Use Topics   Alcohol use: Never   Drug use: Never    Allergies  Allergen Reactions   Codeine     Rash and oral swelling    Coumadin [Warfarin] Anaphylaxis, Swelling and Rash   Dilantin [Phenytoin]  Other (See Comments)    seizure     Review of Systems: ROS All Other ROS was negative, except mentioned above   Past Medical History:  Diagnosis Date   Charcot ankle, right    CHF (congestive heart failure) (HCC)    CKD (chronic kidney disease) stage 3, GFR 30-59 ml/min (HCC)    Diabetic Charcot foot (HCC)    Essential hypertension    Mixed hyperlipidemia    Nephrolithiasis    Neuropathy    OSA (obstructive sleep apnea)    Type 2 diabetes mellitus (HCC)        Scheduled Meds:  carvedilol  3.125 mg Oral BID WC   enoxaparin (LOVENOX) injection  60 mg Subcutaneous Q24H   insulin aspart  0-9 Units Subcutaneous TID WC   Continuous Infusions:  cefTRIAXone (ROCEPHIN)  IV 2 g (10/18/23 0849)   linezolid 600 mg (10/18/23 1127)   PRN Meds:.acetaminophen **OR** acetaminophen, benzonatate, chlorpheniramine-HYDROcodone, diphenhydrAMINE, menthol-cetylpyridinium, mouth rinse, prochlorperazine   OBJECTIVE: Blood pressure (!) 150/73, pulse 71, temperature 98 F (36.7 C), temperature source Oral, resp. rate 20, height 5\' 10"  (1.778 m), weight 134.7 kg, SpO2 96%.  Physical Exam Spoken through phone with patient Reviewed picture of foot/legs and wound care and today's note exam   Lab Results Lab Results  Component Value Date   WBC 7.0 10/18/2023   HGB 8.6 (L) 10/18/2023   HCT 28.5 (L) 10/18/2023   MCV 82.6 10/18/2023   PLT 354 10/18/2023    Lab Results  Component Value Date   CREATININE 2.13 (H) 10/18/2023   BUN 73 (H) 10/18/2023   NA 127 (L) 10/18/2023   K 4.2 10/18/2023   CL 89 (L) 10/18/2023   CO2 28 10/18/2023    Lab Results  Component Value Date   ALT 8 10/17/2023   AST 20 10/17/2023   ALKPHOS 45 10/17/2023   BILITOT 1.1 10/17/2023      Microbiology: Recent Results (from the past 240 hours)  Resp panel by RT-PCR (RSV, Flu A&B, Covid) Anterior Nasal Swab     Status: None   Collection Time: 10/16/23  4:19 PM   Specimen: Anterior Nasal Swab  Result Value  Ref Range Status   SARS Coronavirus 2 by RT PCR NEGATIVE NEGATIVE Final    Comment: (NOTE) SARS-CoV-2 target nucleic acids are NOT DETECTED.  The SARS-CoV-2 RNA is generally detectable in upper respiratory specimens during the acute phase of infection. The lowest concentration of SARS-CoV-2 viral copies this assay can detect is 138 copies/mL. A negative result does not preclude SARS-Cov-2 infection and should not be used as the sole basis for treatment or other patient management decisions. A negative result may occur with  improper specimen collection/handling, submission of specimen other than nasopharyngeal swab, presence of viral mutation(s) within the areas targeted by this assay, and inadequate number of viral copies(<138 copies/mL). A negative result must be combined with clinical observations, patient history, and epidemiological information. The expected result is Negative.  Fact Sheet for Patients:  BloggerCourse.com  Fact Sheet for Healthcare Providers:  SeriousBroker.it  This test is no t yet approved or cleared by the [place] and  has been authorized for detection and/or diagnosis of SARS-CoV-2 by FDA under an Emergency Use Authorization (EUA). This EUA will remain  in effect (meaning this test can be used) for the duration of the COVID-19 declaration under Section 564(b)(1) of the Act, 21 U.S.C.section 360bbb-3(b)(1), unless the authorization is terminated  or revoked sooner.       Influenza A by PCR NEGATIVE NEGATIVE Final   Influenza B by PCR NEGATIVE NEGATIVE Final    Comment: (NOTE) The Xpert Xpress SARS-CoV-2/FLU/RSV plus assay is intended as an aid in the diagnosis of influenza from Nasopharyngeal swab specimens and should not be used as a sole basis for treatment. Nasal washings and aspirates are unacceptable for Xpert Xpress SARS-CoV-2/FLU/RSV testing.  Fact Sheet for  Patients: BloggerCourse.com  Fact Sheet for Healthcare Providers: SeriousBroker.it  This test is not yet approved or cleared by the [place] FDA and has been authorized for detection and/or diagnosis of SARS-CoV-2 by FDA under an Emergency Use Authorization (EUA). This EUA will remain in effect (meaning this test can be used) for the duration of the COVID-19 declaration under Section 564(b)(1) of the Act, 21 U.S.C. section 360bbb-3(b)(1), unless the authorization is terminated or revoked.     Resp Syncytial Virus by PCR NEGATIVE NEGATIVE Final    Comment: (NOTE) Fact Sheet for Patients: BloggerCourse.com  Fact Sheet for Healthcare Providers: SeriousBroker.it  This test is not yet approved or cleared by the [place] FDA and has been authorized for detection and/or diagnosis of SARS-CoV-2 by FDA under an Emergency Use Authorization (EUA). This EUA will remain in effect (meaning this test can be used) for the duration of the COVID-19 declaration under Section 564(b)(1) of the Act, 21 U.S.C. section 360bbb-3(b)(1), unless the authorization is terminated or revoked.  Performed at [facility], [address], [place], [place] [address]   Blood culture (routine x 2)     Status: None (Preliminary result)   Collection Time: 10/16/23  5:34 PM   Specimen: BLOOD  Result Value Ref Range Status   Specimen Description   Final    BLOOD RIGHT ANTECUBITAL Performed at [facility], [address], [place], [place] [address]    Special Requests   Final    BOTTLES DRAWN AEROBIC AND ANAEROBIC Blood Culture adequate volume Performed at [facility], [address] [place], [place] [address]    Culture  Setup Time   Final    IN BOTH AEROBIC AND ANAEROBIC BOTTLES GRAM NEGATIVE RODS Gram Stain Report Called to,Read Back By and Verified With: MICHELLE CATES LAND AT 1136 10/17/2023 BY A.  SNYDER CRITICAL RESULT CALLED TO, READ BACK BY AND VERIFIED WITH: RN MICHELLE ON 10/17/23 @ 1741 BY DRT Performed at [facility], [address], [place], [place] [address]    Culture GRAM NEGATIVE RODS  Final   Report Status PENDING  Incomplete  Blood culture (routine x 2)     Status: None (Preliminary result)   Collection Time: 10/16/23  5:34 PM   Specimen: BLOOD  Result Value Ref Range Status   Specimen Description   Final    BLOOD LEFT ANTECUBITAL Performed at [facility], [address], [place], [place] [address]    Special Requests   Final    BOTTLES DRAWN AEROBIC AND ANAEROBIC Blood Culture adequate volume Performed at [facility], [address] [place], [place] [address]  Culture  Setup Time   Final    IN BOTH AEROBIC AND ANAEROBIC BOTTLES GRAM NEGATIVE RODS CRITICAL VALUE NOTED.  VALUE IS CONSISTENT WITH PREVIOUSLY REPORTED AND CALLED VALUE. Performed at [facility], [address], [place], [place] [address]    Culture GRAM NEGATIVE RODS  Final   Report Status PENDING  Incomplete  Blood Culture ID Panel (Reflexed)     Status: Abnormal   Collection Time: 10/16/23  5:34 PM  Result Value Ref Range Status   Enterococcus faecalis NOT DETECTED NOT DETECTED Final   Enterococcus Faecium NOT DETECTED NOT DETECTED Final   Listeria monocytogenes NOT DETECTED NOT DETECTED Final   Staphylococcus species NOT DETECTED NOT DETECTED Final   Staphylococcus aureus (BCID) NOT DETECTED NOT DETECTED Final   Staphylococcus epidermidis NOT DETECTED NOT DETECTED Final   Staphylococcus lugdunensis NOT DETECTED NOT DETECTED Final   Streptococcus species NOT DETECTED NOT DETECTED Final   Streptococcus agalactiae NOT DETECTED NOT DETECTED Final   Streptococcus pneumoniae NOT DETECTED NOT DETECTED Final   Streptococcus pyogenes NOT DETECTED NOT DETECTED Final   A.calcoaceticus-baumannii NOT DETECTED NOT DETECTED Final   Bacteroides fragilis NOT DETECTED NOT DETECTED Final    Enterobacterales DETECTED (A) NOT DETECTED Final    Comment: Enterobacterales represent a large order of gram negative bacteria, not a single organism. CRITICAL RESULT CALLED TO, READ BACK BY AND VERIFIED WITH: RN MICHELLE ON 10/17/23 @ 1741 BY DRT    Enterobacter cloacae complex NOT DETECTED NOT DETECTED Final   Escherichia coli NOT DETECTED NOT DETECTED Final   Klebsiella aerogenes NOT DETECTED NOT DETECTED Final   Klebsiella oxytoca NOT DETECTED NOT DETECTED Final   Klebsiella pneumoniae NOT DETECTED NOT DETECTED Final   Proteus species DETECTED (A) NOT DETECTED Final    Comment: CRITICAL RESULT CALLED TO, READ BACK BY AND VERIFIED WITH: RN MICHELLE ON 10/17/23 @ 1741 BY DRT    Salmonella species NOT DETECTED NOT DETECTED Final   Serratia marcescens NOT DETECTED NOT DETECTED Final   Haemophilus influenzae NOT DETECTED NOT DETECTED Final   Neisseria meningitidis NOT DETECTED NOT DETECTED Final   Pseudomonas aeruginosa NOT DETECTED NOT DETECTED Final   Stenotrophomonas maltophilia NOT DETECTED NOT DETECTED Final   Candida albicans NOT DETECTED NOT DETECTED Final   Candida auris NOT DETECTED NOT DETECTED Final   Candida glabrata NOT DETECTED NOT DETECTED Final   Candida krusei NOT DETECTED NOT DETECTED Final   Candida parapsilosis NOT DETECTED NOT DETECTED Final   Candida tropicalis NOT DETECTED NOT DETECTED Final   Cryptococcus neoformans/gattii NOT DETECTED NOT DETECTED Final   CTX-M ESBL NOT DETECTED NOT DETECTED Final   Carbapenem resistance IMP NOT DETECTED NOT DETECTED Final   Carbapenem resistance KPC NOT DETECTED NOT DETECTED Final   Carbapenem resistance NDM NOT DETECTED NOT DETECTED Final   Carbapenem resist OXA 48 LIKE NOT DETECTED NOT DETECTED Final   Carbapenem resistance VIM NOT DETECTED NOT DETECTED Final    Comment: Performed at [facility], [address] [place], [place] [address]  Urine Culture     Status: Abnormal   Collection Time: 10/17/23  6:30 AM    Specimen: Urine, Random  Result Value Ref Range Status   Specimen Description   Final    URINE, RANDOM Performed at [facility], [address], [place], [place] [address]    Special Requests   Final    NONE Reflexed from [id number] Performed at [facility], [address], [place], [place] [address]  Culture MULTIPLE SPECIES PRESENT, SUGGEST RECOLLECTION (A)  Final   Report Status 10/18/2023 FINAL  Final     Serology:    Imaging: If present, new imagings (plain films, ct scans, and mri) have been personally visualized and interpreted; radiology reports have been reviewed. Decision making incorporated into the Impression / Recommendations.  4/3 mri right foot without contrast 1. Plantar wound at the midfoot with osteomyelitis of the underlying base and proximal half of the fourth and fifth metatarsals and partially visualized cuboid, at the level of fourth and fifth TMT joints. Areas of cortical irregularity and fragmentation at the base of fourth and fifth metatarsals could reflect a combination of neuropathic arthropathy and osteomyelitis. There is evidence of surrounding cellulitis. No loculated collection identified. 2. Mild marrow edema of the near completely ankylosed third TMT joint could reflect chronic degenerative changes, however, osteomyelitis can not be excluded.   Raymondo Band, MD [facility] [phone] pager    10/18/2023, 12:18 PM

## 2023-10-18 NOTE — Evaluation (Addendum)
 Physical Therapy Evaluation Patient Details Name: Suzanne Tran MRN: [id number] DOB: [birth year] Today's Date: 10/18/2023  History of Present Illness  Suzanne Tran is a 68 y.o. female with medical history significant of T2DM, hypertension, CKD stage 3, hypertension, right Charcot's foot, OSA, CHF who presents to the emergency department due to several weeks of bilateral leg swelling which has progressively worsened.  She has a wound on the plantar surface of right foot which home health has been dressing.  Last night, she developed fever of 101F and noted some redness around the right foot wound with extension to right lower leg.  Home health nurse came out to the and noted that the wound was no longer draining, but was still advised to go to the ED due to the redness of the leg and the fever.  Patient also complained of 1 day of burning sensation on urination, obesity called in Cipro, but she just filled the prescription today and has not taken any of the medication.  She endorsed recent UTI in March as well as yeast infection which was treated with antibiotics at that time.   Clinical Impression  Patient agreeable to OP/PT Evaluation. On this date, patient required mod-max A for all bed mobility. Unable to complete functional transfers or ambulation due to patient weakness and fear. Attempts at standing and scooting on EOB unsuccessful. However, patient does report decreased pain in RLE. RLE wrapped, and dry t/o session. Patient reports having pain and recent issues at home with (I) with ADLs. Patient with transient dizziness during session that resolves quickly. Patient will benefit from continued skilled physical therapy acutely and in SNF setting in order to address above/below deficits to improve function.       If plan is discharge home, recommend the following: A lot of help with bathing/dressing/bathroom;A lot of help with walking and/or transfers   Can travel by private vehicle   No     Equipment Recommendations None recommended by PT  Recommendations for Other Services       Functional Status Assessment Patient has had a recent decline in their functional status and/or demonstrates limited ability to make significant improvements in function in a reasonable and predictable amount of time     Precautions / Restrictions Precautions Precautions: Fall Recall of Precautions/Restrictions: Intact Restrictions Weight Bearing Restrictions Per Provider Order: No      Mobility  Bed Mobility Overal bed mobility: Needs Assistance Bed Mobility: Supine to Sit, Sit to Supine     Supine to sit: Mod assist, Max assist, HOB elevated Sit to supine: Max assist, Total assist   General bed mobility comments: HOB elevated. A needed for LE to EOB and for trunk control    Transfers                   General transfer comment: Attempt at STS unsuccessful. Pt fearful and weak. Unable to scoot in bed.    Ambulation/Gait               General Gait Details: Not assessed on this date.  Stairs            Wheelchair Mobility     Tilt Bed    Modified Rankin (Stroke Patients Only)       Balance Overall balance assessment: Needs assistance Sitting-balance support: No upper extremity supported, Feet supported Sitting balance-Leahy Scale: Poor Sitting balance - Comments: Pt cautious/anxious. Poor/Fair sitting EOB balance       Standing balance comment: Unable to assess  Pertinent Vitals/Pain Pain Assessment Pain Assessment: No/denies pain    Home Living Family/patient expects to be discharged to:: Private residence Living Arrangements: Alone;Other (Comment) Available Help at Discharge: Other (Comment);Family Type of Home: Apartment Home Access: Level entry       Home Layout: One level Home Equipment: Agricultural consultant (2 wheels);Rollator (4 wheels);BSC/3in1;Hospital bed;Shower seat;Grab bars - tub/shower;Grab  bars - toilet Additional Comments: sleeps in recliner    Prior Function Prior Level of Function : Needs assist       Physical Assist : ADLs (physical)     Mobility Comments: Pt reported that prior to March she was ambulating in the house with the rollator. ADLs Comments: Until March the pt reported independence for ADL's. Since then she has needed help with bathing, dressing, and toileting.     Extremity/Trunk Assessment   Upper Extremity Assessment Upper Extremity Assessment: Defer to OT evaluation    Lower Extremity Assessment Lower Extremity Assessment: Generalized weakness;RLE deficits/detail;LLE deficits/detail RLE Deficits / Details: 3-/5. Pt barely able to clear floor for knee ext or hip flexion against gravity. RLE Sensation: history of peripheral neuropathy RLE Coordination: decreased gross motor LLE Deficits / Details: Increased strength as compared to R but generally weak overall LLE Sensation: history of peripheral neuropathy LLE Coordination: decreased gross motor       Communication   Communication Communication: No apparent difficulties    Cognition Arousal: Alert Behavior During Therapy: WFL for tasks assessed/performed                           PT - Cognition Comments: Emotional t/o session Following commands: Intact       Cueing Cueing Techniques: Verbal cues     General Comments General comments (skin integrity, edema, etc.): RLE bandage covered and dry. Considerable hemosideran staining bilat.    Exercises     Assessment/Plan    PT Assessment Patient needs continued PT services;All further PT needs can be met in the next venue of care  PT Problem List Decreased strength;Decreased range of motion;Decreased activity tolerance;Decreased mobility       PT Treatment Interventions Gait training;Functional mobility training;Therapeutic activities;Therapeutic exercise;Patient/family education    PT Goals (Current goals can be found  in the Care Plan section)  Acute Rehab PT Goals Patient Stated Goal: Return home after short SNF stay PT Goal Formulation: With patient Time For Goal Achievement: 11/01/23 Potential to Achieve Goals: Good    Frequency Min 2X/week     Co-evaluation PT/OT/SLP Co-Evaluation/Treatment: Yes Reason for Co-Treatment: To address functional/ADL transfers PT goals addressed during session: Mobility/safety with mobility         AM-PAC PT "6 Clicks" Mobility  Outcome Measure Help needed turning from your back to your side while in a flat bed without using bedrails?: A Lot Help needed moving from lying on your back to sitting on the side of a flat bed without using bedrails?: A Lot Help needed moving to and from a bed to a chair (including a wheelchair)?: A Lot Help needed standing up from a chair using your arms (e.g., wheelchair or bedside chair)?: Total Help needed to walk in hospital room?: A Lot Help needed climbing 3-5 steps with a railing? : Total 6 Click Score: 10    End of Session Equipment Utilized During Treatment: Gait belt Activity Tolerance: Patient limited by fatigue Patient left: in bed;with call bell/phone within reach   PT Visit Diagnosis: Difficulty in walking, not elsewhere classified (R26.2);Muscle  weakness (generalized) (M62.81)    Time: 1000-1029 PT Time Calculation (min) (ACUTE ONLY): 29 min   Charges:   PT Evaluation $PT Eval Moderate Complexity: 1 Mod PT Treatments $Therapeutic Activity: 23-37 mins PT General Charges $$ ACUTE PT VISIT: 1 Visit         3:53 PM, 10/18/23 Jude Naclerio Powell-Butler, PT, DPT [place] with [facility]

## 2023-10-18 NOTE — Plan of Care (Signed)
  Problem: Education: Goal: Knowledge of General Education information will improve Description: Including pain rating scale, medication(s)/side effects and non-pharmacologic comfort measures Outcome: Progressing   Problem: Clinical Measurements: Goal: Respiratory complications will improve Outcome: Progressing   Problem: Nutrition: Goal: Adequate nutrition will be maintained Outcome: Progressing   Problem: Pain Managment: Goal: General experience of comfort will improve and/or be controlled Outcome: Progressing

## 2023-10-19 LAB — CULTURE, BLOOD (ROUTINE X 2)
Special Requests: ADEQUATE
Special Requests: ADEQUATE

## 2023-10-21 NOTE — Transitions of Care (Post Inpatient/ED Visit) (Signed)
   10/21/2023  Name: Suzanne Tran MRN: [id number] DOB: Aug 16, 1955  Today's TOC FU Call Status:   Patient chart reviewed for transition of care. Patient transferred to [facility].    Bary Leriche RN, MSN [facility] RN Care Manager Direct Dial: [phone]  Fax: [phone] Website: [email or web address]

## 2023-10-25 DIAGNOSIS — R7881 Bacteremia: Secondary | ICD-10-CM | POA: Diagnosis not present

## 2023-10-25 DIAGNOSIS — M86171 Other acute osteomyelitis, right ankle and foot: Secondary | ICD-10-CM | POA: Diagnosis not present

## 2023-10-28 DIAGNOSIS — N19 Unspecified kidney failure: Secondary | ICD-10-CM | POA: Diagnosis not present

## 2023-10-28 DIAGNOSIS — Z4901 Encounter for fitting and adjustment of extracorporeal dialysis catheter: Secondary | ICD-10-CM | POA: Diagnosis not present

## 2023-10-31 DIAGNOSIS — N179 Acute kidney failure, unspecified: Secondary | ICD-10-CM | POA: Diagnosis not present

## 2023-10-31 DIAGNOSIS — R188 Other ascites: Secondary | ICD-10-CM | POA: Diagnosis not present

## 2023-11-20 ENCOUNTER — Encounter (HOSPITAL_COMMUNITY): Payer: Self-pay | Admitting: Hematology

## 2023-12-15 DEATH — deceased

## 2024-02-27 ENCOUNTER — Other Ambulatory Visit: Payer: Self-pay | Admitting: *Deleted
